# Patient Record
Sex: Male | Born: 1935 | Race: White | Hispanic: No | Smoking: Former smoker
Health system: Southern US, Community
[De-identification: ages and names within clinical notes are randomized; demographics above are authoritative.]

## PROBLEM LIST (undated history)

## (undated) DIAGNOSIS — M19041 Primary osteoarthritis, right hand: Secondary | ICD-10-CM

## (undated) DIAGNOSIS — I2510 Atherosclerotic heart disease of native coronary artery without angina pectoris: Secondary | ICD-10-CM

## (undated) DIAGNOSIS — R0600 Dyspnea, unspecified: Secondary | ICD-10-CM

## (undated) DIAGNOSIS — J189 Pneumonia, unspecified organism: Secondary | ICD-10-CM

## (undated) DIAGNOSIS — Z950 Presence of cardiac pacemaker: Secondary | ICD-10-CM

## (undated) DIAGNOSIS — I447 Left bundle-branch block, unspecified: Secondary | ICD-10-CM

## (undated) DIAGNOSIS — Z87442 Personal history of urinary calculi: Secondary | ICD-10-CM

## (undated) DIAGNOSIS — R001 Bradycardia, unspecified: Secondary | ICD-10-CM

## (undated) DIAGNOSIS — I5189 Other ill-defined heart diseases: Secondary | ICD-10-CM

## (undated) DIAGNOSIS — E785 Hyperlipidemia, unspecified: Secondary | ICD-10-CM

## (undated) DIAGNOSIS — N400 Enlarged prostate without lower urinary tract symptoms: Secondary | ICD-10-CM

## (undated) DIAGNOSIS — Z9189 Other specified personal risk factors, not elsewhere classified: Secondary | ICD-10-CM

## (undated) DIAGNOSIS — I441 Atrioventricular block, second degree: Secondary | ICD-10-CM

## (undated) DIAGNOSIS — C679 Malignant neoplasm of bladder, unspecified: Secondary | ICD-10-CM

## (undated) DIAGNOSIS — K219 Gastro-esophageal reflux disease without esophagitis: Secondary | ICD-10-CM

## (undated) DIAGNOSIS — H409 Unspecified glaucoma: Secondary | ICD-10-CM

## (undated) DIAGNOSIS — Z85528 Personal history of other malignant neoplasm of kidney: Secondary | ICD-10-CM

## (undated) DIAGNOSIS — J449 Chronic obstructive pulmonary disease, unspecified: Secondary | ICD-10-CM

## (undated) DIAGNOSIS — M19042 Primary osteoarthritis, left hand: Secondary | ICD-10-CM

## (undated) DIAGNOSIS — M1990 Unspecified osteoarthritis, unspecified site: Secondary | ICD-10-CM

## (undated) DIAGNOSIS — Z8719 Personal history of other diseases of the digestive system: Secondary | ICD-10-CM

## (undated) DIAGNOSIS — Z8679 Personal history of other diseases of the circulatory system: Secondary | ICD-10-CM

## (undated) DIAGNOSIS — N289 Disorder of kidney and ureter, unspecified: Secondary | ICD-10-CM

## (undated) DIAGNOSIS — I10 Essential (primary) hypertension: Secondary | ICD-10-CM

## (undated) DIAGNOSIS — D649 Anemia, unspecified: Secondary | ICD-10-CM

## (undated) HISTORY — PX: COLONOSCOPY: SHX174

## (undated) HISTORY — PX: EYE SURGERY: SHX253

## (undated) HISTORY — PX: INSERT / REPLACE / REMOVE PACEMAKER: SUR710

## (undated) HISTORY — PX: CORONARY ANGIOPLASTY WITH STENT PLACEMENT: SHX49

## (undated) HISTORY — DX: Disorder of kidney and ureter, unspecified: N28.9

---

## 1988-02-03 HISTORY — PX: APPENDECTOMY: SHX54

## 2004-01-03 DIAGNOSIS — Z8679 Personal history of other diseases of the circulatory system: Secondary | ICD-10-CM

## 2004-01-03 HISTORY — DX: Personal history of other diseases of the circulatory system: Z86.79

## 2004-06-13 ENCOUNTER — Ambulatory Visit: Payer: Self-pay | Admitting: Unknown Physician Specialty

## 2004-11-18 ENCOUNTER — Ambulatory Visit: Payer: Self-pay | Admitting: Cardiovascular Disease

## 2004-11-18 HISTORY — PX: LEFT HEART CATH AND CORONARY ANGIOGRAPHY: CATH118249

## 2004-11-21 HISTORY — PX: CORONARY ANGIOPLASTY WITH STENT PLACEMENT: SHX49

## 2005-03-14 ENCOUNTER — Encounter: Payer: Self-pay | Admitting: Cardiovascular Disease

## 2005-04-02 ENCOUNTER — Encounter: Payer: Self-pay | Admitting: Cardiovascular Disease

## 2005-05-02 ENCOUNTER — Encounter: Payer: Self-pay | Admitting: Cardiovascular Disease

## 2005-06-02 ENCOUNTER — Encounter: Payer: Self-pay | Admitting: Cardiovascular Disease

## 2005-11-06 ENCOUNTER — Ambulatory Visit: Payer: Self-pay | Admitting: Internal Medicine

## 2005-11-10 ENCOUNTER — Ambulatory Visit: Payer: Self-pay | Admitting: Gastroenterology

## 2006-12-13 ENCOUNTER — Ambulatory Visit: Payer: Self-pay | Admitting: Internal Medicine

## 2009-01-02 HISTORY — PX: CATARACT EXTRACTION W/ INTRAOCULAR LENS  IMPLANT, BILATERAL: SHX1307

## 2010-03-02 ENCOUNTER — Ambulatory Visit: Payer: Self-pay | Admitting: Ophthalmology

## 2010-04-20 ENCOUNTER — Ambulatory Visit: Payer: Self-pay | Admitting: Ophthalmology

## 2010-07-26 ENCOUNTER — Ambulatory Visit: Payer: Self-pay | Admitting: Emergency Medicine

## 2010-07-28 LAB — PATHOLOGY REPORT

## 2010-12-20 ENCOUNTER — Encounter (HOSPITAL_COMMUNITY)
Admission: RE | Admit: 2010-12-20 | Discharge: 2010-12-20 | Disposition: A | Discharge status: Home / Self Care | Payer: Medicare Other | Source: Ambulatory Visit | Attending: Urology | Admitting: Urology

## 2010-12-20 ENCOUNTER — Other Ambulatory Visit: Payer: Self-pay | Admitting: Urology

## 2010-12-20 ENCOUNTER — Encounter (HOSPITAL_COMMUNITY): Payer: Self-pay | Admitting: Pharmacy Technician

## 2010-12-20 ENCOUNTER — Ambulatory Visit (HOSPITAL_COMMUNITY)
Admission: RE | Admit: 2010-12-20 | Discharge: 2010-12-20 | Disposition: A | Discharge status: Home / Self Care | Payer: Medicare Other | Source: Ambulatory Visit | Attending: Urology | Admitting: Urology

## 2010-12-20 ENCOUNTER — Encounter (HOSPITAL_COMMUNITY): Payer: Self-pay

## 2010-12-20 DIAGNOSIS — I10 Essential (primary) hypertension: Secondary | ICD-10-CM | POA: Insufficient documentation

## 2010-12-20 DIAGNOSIS — Z01818 Encounter for other preprocedural examination: Secondary | ICD-10-CM | POA: Insufficient documentation

## 2010-12-20 HISTORY — DX: Unspecified osteoarthritis, unspecified site: M19.90

## 2010-12-20 HISTORY — DX: Atherosclerotic heart disease of native coronary artery without angina pectoris: I25.10

## 2010-12-20 HISTORY — DX: Gastro-esophageal reflux disease without esophagitis: K21.9

## 2010-12-20 HISTORY — DX: Hyperlipidemia, unspecified: E78.5

## 2010-12-20 HISTORY — DX: Essential (primary) hypertension: I10

## 2010-12-20 HISTORY — DX: Personal history of other diseases of the digestive system: Z87.19

## 2010-12-20 LAB — BASIC METABOLIC PANEL
BUN: 21 mg/dL (ref 6–23)
CO2: 28 mEq/L (ref 19–32)
GFR calc non Af Amer: 54 mL/min — ABNORMAL LOW (ref >90)
Glucose, Bld: 150 mg/dL — ABNORMAL HIGH (ref 70–99)
Potassium: 3.2 mEq/L — ABNORMAL LOW (ref 3.5–5.1)

## 2010-12-20 LAB — CBC
HCT: 37.1 % — ABNORMAL LOW (ref 39.0–52.0)
Hemoglobin: 12.3 g/dL — ABNORMAL LOW (ref 13.0–17.0)
MCH: 31.9 pg (ref 26.0–34.0)
MCHC: 33.2 g/dL (ref 30.0–36.0)
RBC: 3.86 MIL/uL — ABNORMAL LOW (ref 4.22–5.81)

## 2010-12-20 NOTE — Pre-Procedure Instructions (Signed)
Stress test done 07/13/10 results on chart.  EKG from stress test will arrive from Dr Rosario Jacks office on 12/21/10.   LOV note from PCP on chart from 12/12/10 Pt states he has not seen cardiology since 2009.   Cardiac stents placed at [facility] in 2006 Pt states he has not been to [facility] since 2006.

## 2010-12-20 NOTE — Patient Instructions (Signed)
[place]  12/20/2010   Your procedure is scheduled on:  0855am-0955 am   Report to [facility] at [id number] AM.  Call this number if you have problems the morning of surgery: [phone]   Remember:   Do not eat food:After Midnight.  May have clear liquids:until Midnight .  Clear liquids include soda, tea, black coffee, apple or grape juice, broth.  Take these medicines the morning of surgery with A SIP OF WATER:    Do not wear jewelry,   Do not wear lotions, powders, or perfumes.     Do not bring valuables to the hospital.  Contacts, dentures or bridgework may not be worn into surgery.  Leave suitcase in the car. After surgery it may be brought to your room.  For patients admitted to the hospital, checkout time is 11:00 AM the day of discharge.      Special Instructions: CHG Shower Use Special Wash: 1/2 bottle night before surgery and 1/2 bottle morning of surgery. Shower chin to toes with CHG. Wash face and private parts with regular soap.    Please read over the following fact sheets that you were given: MRSA Information, coughing and deep breathing exercises, leg exercises

## 2010-12-21 NOTE — Pre-Procedure Instructions (Signed)
ekg report 07/13/10 received from dr. Rosario Jacks [facility])  And placed on this chart

## 2010-12-22 ENCOUNTER — Other Ambulatory Visit: Payer: Self-pay | Admitting: Urology

## 2010-12-22 ENCOUNTER — Ambulatory Visit (HOSPITAL_COMMUNITY): Payer: Medicare Other | Admitting: Anesthesiology

## 2010-12-22 ENCOUNTER — Encounter (HOSPITAL_COMMUNITY): Payer: Self-pay | Admitting: Anesthesiology

## 2010-12-22 ENCOUNTER — Encounter (HOSPITAL_COMMUNITY): Payer: Self-pay | Admitting: *Deleted

## 2010-12-22 ENCOUNTER — Encounter (HOSPITAL_COMMUNITY)
Admission: RE | Disposition: A | Discharge status: Home / Self Care | Payer: Self-pay | Source: Ambulatory Visit | Attending: Urology

## 2010-12-22 ENCOUNTER — Observation Stay (HOSPITAL_COMMUNITY)
Admission: RE | Admit: 2010-12-22 | Discharge: 2010-12-23 | Disposition: A | Discharge status: Home / Self Care | Payer: Medicare Other | Source: Ambulatory Visit | Attending: Urology | Admitting: Urology

## 2010-12-22 DIAGNOSIS — I129 Hypertensive chronic kidney disease with stage 1 through stage 4 chronic kidney disease, or unspecified chronic kidney disease: Secondary | ICD-10-CM | POA: Insufficient documentation

## 2010-12-22 DIAGNOSIS — I2510 Atherosclerotic heart disease of native coronary artery without angina pectoris: Secondary | ICD-10-CM | POA: Insufficient documentation

## 2010-12-22 DIAGNOSIS — R351 Nocturia: Secondary | ICD-10-CM | POA: Insufficient documentation

## 2010-12-22 DIAGNOSIS — Z01812 Encounter for preprocedural laboratory examination: Secondary | ICD-10-CM | POA: Insufficient documentation

## 2010-12-22 DIAGNOSIS — R350 Frequency of micturition: Secondary | ICD-10-CM | POA: Insufficient documentation

## 2010-12-22 DIAGNOSIS — R300 Dysuria: Secondary | ICD-10-CM | POA: Insufficient documentation

## 2010-12-22 DIAGNOSIS — C679 Malignant neoplasm of bladder, unspecified: Principal | ICD-10-CM | POA: Insufficient documentation

## 2010-12-22 DIAGNOSIS — K219 Gastro-esophageal reflux disease without esophagitis: Secondary | ICD-10-CM | POA: Insufficient documentation

## 2010-12-22 DIAGNOSIS — Z79899 Other long term (current) drug therapy: Secondary | ICD-10-CM | POA: Insufficient documentation

## 2010-12-22 DIAGNOSIS — E785 Hyperlipidemia, unspecified: Secondary | ICD-10-CM | POA: Insufficient documentation

## 2010-12-22 DIAGNOSIS — R319 Hematuria, unspecified: Secondary | ICD-10-CM | POA: Insufficient documentation

## 2010-12-22 DIAGNOSIS — N189 Chronic kidney disease, unspecified: Secondary | ICD-10-CM | POA: Insufficient documentation

## 2010-12-22 HISTORY — PX: TRANSURETHRAL RESECTION OF BLADDER TUMOR: SHX2575

## 2010-12-22 SURGERY — TRANSURETHRAL PROSTATECTOMY WITH GYRUS INSTRUMENTS
Anesthesia: General | Site: Bladder | Wound class: Clean Contaminated

## 2010-12-22 MED ORDER — ZOLPIDEM TARTRATE 5 MG PO TABS: 5.0000 mg | ORAL_TABLET | Freq: Every evening | ORAL | Status: DC | PRN

## 2010-12-22 MED ORDER — HYDROCODONE-ACETAMINOPHEN 5-325 MG PO TABS: 1.0000 | ORAL_TABLET | ORAL | Status: DC | PRN

## 2010-12-22 MED ORDER — ONDANSETRON HCL 4 MG/2ML IJ SOLN: 4.0000 mg | INTRAMUSCULAR | Status: DC | PRN

## 2010-12-22 MED ORDER — LIDOCAINE HCL (CARDIAC) 20 MG/ML IV SOLN
INTRAVENOUS | Status: DC | PRN
  Administered 2010-12-22: 80 mg via INTRAVENOUS

## 2010-12-22 MED ORDER — HYDROMORPHONE HCL PF 1 MG/ML IJ SOLN
INTRAMUSCULAR | Status: AC
  Filled 2010-12-22: qty 1

## 2010-12-22 MED ORDER — HYDROMORPHONE HCL PF 1 MG/ML IJ SOLN
0.2500 mg | INTRAMUSCULAR | Status: DC | PRN
  Administered 2010-12-22 (×4): 0.5 mg via INTRAVENOUS

## 2010-12-22 MED ORDER — ACETAMINOPHEN 10 MG/ML IV SOLN
1000.0000 mg | Freq: Once | INTRAVENOUS | Status: DC
  Administered 2010-12-22: 1000 mg via INTRAVENOUS

## 2010-12-22 MED ORDER — PROMETHAZINE HCL 25 MG/ML IJ SOLN: 6.2500 mg | INTRAMUSCULAR | Status: DC | PRN

## 2010-12-22 MED ORDER — ACETAMINOPHEN 10 MG/ML IV SOLN
INTRAVENOUS | Status: AC
  Filled 2010-12-22: qty 100

## 2010-12-22 MED ORDER — PROPOFOL 10 MG/ML IV BOLUS
INTRAVENOUS | Status: DC | PRN
  Administered 2010-12-22: 160 mg via INTRAVENOUS

## 2010-12-22 MED ORDER — ISOSORBIDE MONONITRATE ER 30 MG PO TB24
30.0000 mg | ORAL_TABLET | Freq: Every day | ORAL | Status: DC
  Administered 2010-12-22: 30 mg via ORAL
  Filled 2010-12-22 (×2): qty 1

## 2010-12-22 MED ORDER — BISACODYL 10 MG RE SUPP: 10.0000 mg | Freq: Every day | RECTAL | Status: DC | PRN

## 2010-12-22 MED ORDER — SODIUM CHLORIDE 0.9 % IR SOLN
Status: DC | PRN
  Administered 2010-12-22: 3000 mL

## 2010-12-22 MED ORDER — LATANOPROST 0.005 % OP SOLN
1.0000 [drp] | Freq: Every day | OPHTHALMIC | Status: DC
  Administered 2010-12-22: 1 [drp] via OPHTHALMIC
  Filled 2010-12-22: qty 2.5

## 2010-12-22 MED ORDER — LACTATED RINGERS IV SOLN
INTRAVENOUS | Status: DC
  Administered 2010-12-22: 1000 mL via INTRAVENOUS

## 2010-12-22 MED ORDER — RANOLAZINE ER 500 MG PO TB12
500.0000 mg | ORAL_TABLET | Freq: Two times a day (BID) | ORAL | Status: DC
  Administered 2010-12-23: 500 mg via ORAL
  Filled 2010-12-22 (×3): qty 1

## 2010-12-22 MED ORDER — LORATADINE 10 MG PO TABS
10.0000 mg | ORAL_TABLET | Freq: Every day | ORAL | Status: DC
  Administered 2010-12-22 – 2010-12-23 (×2): 10 mg via ORAL
  Filled 2010-12-22 (×2): qty 1

## 2010-12-22 MED ORDER — ACETAMINOPHEN 10 MG/ML IV SOLN
1000.0000 mg | Freq: Four times a day (QID) | INTRAVENOUS | Status: DC
  Administered 2010-12-22 – 2010-12-23 (×3): 1000 mg via INTRAVENOUS
  Filled 2010-12-22 (×4): qty 100

## 2010-12-22 MED ORDER — LACTATED RINGERS IV SOLN
INTRAVENOUS | Status: DC | PRN
  Administered 2010-12-22: 08:00:00 via INTRAVENOUS

## 2010-12-22 MED ORDER — SIMVASTATIN 5 MG PO TABS
5.0000 mg | ORAL_TABLET | Freq: Every day | ORAL | Status: DC
  Administered 2010-12-22: 5 mg via ORAL
  Filled 2010-12-22 (×2): qty 1

## 2010-12-22 MED ORDER — CIPROFLOXACIN HCL 500 MG PO TABS
500.0000 mg | ORAL_TABLET | Freq: Two times a day (BID) | ORAL | Status: DC
  Administered 2010-12-22 – 2010-12-23 (×2): 500 mg via ORAL
  Filled 2010-12-22 (×3): qty 1

## 2010-12-22 MED ORDER — OXYBUTYNIN CHLORIDE 5 MG PO TABS
5.0000 mg | ORAL_TABLET | Freq: Three times a day (TID) | ORAL | Status: DC | PRN
  Administered 2010-12-22: 5 mg via ORAL
  Filled 2010-12-22: qty 1

## 2010-12-22 MED ORDER — SALINE NASAL SPRAY 0.65 % NA SOLN
1.0000 | Freq: Two times a day (BID) | NASAL | Status: DC
  Administered 2010-12-23: 1 via NASAL
  Filled 2010-12-22 (×7): qty 0.5

## 2010-12-22 MED ORDER — BACITRACIN-NEOMYCIN-POLYMYXIN 400-5-5000 EX OINT: 1.0000 "application " | TOPICAL_OINTMENT | Freq: Three times a day (TID) | CUTANEOUS | Status: DC | PRN

## 2010-12-22 MED ORDER — CIPROFLOXACIN IN D5W 400 MG/200ML IV SOLN
INTRAVENOUS | Status: DC | PRN
  Administered 2010-12-22: 400 mg via INTRAVENOUS

## 2010-12-22 MED ORDER — FENTANYL CITRATE 0.05 MG/ML IJ SOLN
INTRAMUSCULAR | Status: DC | PRN
  Administered 2010-12-22: 25 ug via INTRAVENOUS
  Administered 2010-12-22: 50 ug via INTRAVENOUS
  Administered 2010-12-22: 25 ug via INTRAVENOUS

## 2010-12-22 MED ORDER — HYDROCHLOROTHIAZIDE 25 MG PO TABS
25.0000 mg | ORAL_TABLET | Freq: Every day | ORAL | Status: DC
  Administered 2010-12-23: 25 mg via ORAL
  Filled 2010-12-22: qty 1

## 2010-12-22 MED ORDER — LOSARTAN POTASSIUM 50 MG PO TABS
50.0000 mg | ORAL_TABLET | Freq: Every day | ORAL | Status: DC
  Administered 2010-12-22: 50 mg via ORAL
  Filled 2010-12-22 (×2): qty 1

## 2010-12-22 MED ORDER — MIDAZOLAM HCL 5 MG/5ML IJ SOLN
INTRAMUSCULAR | Status: DC | PRN
  Administered 2010-12-22 (×2): 1 mg via INTRAVENOUS

## 2010-12-22 MED ORDER — MONTELUKAST SODIUM 10 MG PO TABS
10.0000 mg | ORAL_TABLET | Freq: Every day | ORAL | Status: DC
  Administered 2010-12-22: 10 mg via ORAL
  Filled 2010-12-22 (×2): qty 1

## 2010-12-22 MED ORDER — LACTATED RINGERS IV SOLN: INTRAVENOUS | Status: DC

## 2010-12-22 MED ORDER — HYDROMORPHONE HCL PF 1 MG/ML IJ SOLN
0.5000 mg | INTRAMUSCULAR | Status: DC | PRN
  Administered 2010-12-22: 1 mg via INTRAVENOUS
  Filled 2010-12-22: qty 1

## 2010-12-22 MED ORDER — ONDANSETRON HCL 4 MG/2ML IJ SOLN
INTRAMUSCULAR | Status: DC | PRN
  Administered 2010-12-22: 4 mg via INTRAVENOUS

## 2010-12-22 MED ORDER — DEXTROSE-NACL 5-0.45 % IV SOLN
INTRAVENOUS | Status: DC
  Administered 2010-12-22 (×2): via INTRAVENOUS
  Administered 2010-12-23: 100 mL via INTRAVENOUS

## 2010-12-22 MED ORDER — CIPROFLOXACIN IN D5W 400 MG/200ML IV SOLN: 400.0000 mg | INTRAVENOUS | Status: DC

## 2010-12-22 MED ORDER — BELLADONNA ALKALOIDS-OPIUM 16.2-60 MG RE SUPP
RECTAL | Status: DC | PRN
  Administered 2010-12-22: 1 via RECTAL

## 2010-12-22 MED ORDER — PANTOPRAZOLE SODIUM 40 MG PO TBEC
40.0000 mg | DELAYED_RELEASE_TABLET | Freq: Every day | ORAL | Status: DC
  Administered 2010-12-22 – 2010-12-23 (×2): 40 mg via ORAL
  Filled 2010-12-22 (×2): qty 1

## 2010-12-22 SURGICAL SUPPLY — 31 items
BAG URINE DRAINAGE (UROLOGICAL SUPPLIES) ×2 IMPLANT
BAG URO CATCHER STRL LF (DRAPE) ×2 IMPLANT
BLADE SURG 15 STRL LF DISP TIS (BLADE) IMPLANT
BLADE SURG 15 STRL SS (BLADE)
CATH AINSWORTH 30CC 24FR (CATHETERS) IMPLANT
CATH FOLEY 2WAY SLVR  5CC 20FR (CATHETERS) ×1
CATH FOLEY 2WAY SLVR 5CC 20FR (CATHETERS) ×1 IMPLANT
CATH FOLEY 3WAY 30CC 24FR (CATHETERS)
CATH URO 16X24FR 3W FL PS (CATHETERS) IMPLANT
CLOTH BEACON ORANGE TIMEOUT ST (SAFETY) ×2 IMPLANT
DRAPE CAMERA CLOSED 9X96 (DRAPES) ×2 IMPLANT
ELECT HF RESECT BIPO 24F 45 ND (CUTTING LOOP) ×2 IMPLANT
ELECT LOOP MED HF 24F 12D (CUTTING LOOP) ×2 IMPLANT
ELECT REM PT RETURN 9FT ADLT (ELECTROSURGICAL)
ELECTRODE REM PT RTRN 9FT ADLT (ELECTROSURGICAL) IMPLANT
GLOVE BIOGEL M STRL SZ7.5 (GLOVE) ×2 IMPLANT
GOWN STRL NON-REIN LRG LVL3 (GOWN DISPOSABLE) ×2 IMPLANT
GOWN STRL REIN XL XLG (GOWN DISPOSABLE) ×2 IMPLANT
HOLDER FOLEY CATH W/STRAP (MISCELLANEOUS) ×2 IMPLANT
KIT ASPIRATION TUBING (SET/KITS/TRAYS/PACK) ×2 IMPLANT
KIT SUPRAPUBIC CATH (MISCELLANEOUS) IMPLANT
MANIFOLD NEPTUNE II (INSTRUMENTS) ×2 IMPLANT
MARKER SKIN DUAL TIP RULER LAB (MISCELLANEOUS) ×2 IMPLANT
NEEDLE SPNL 18GX3.5 QUINCKE PK (NEEDLE) IMPLANT
NS IRRIG 1000ML POUR BTL (IV SOLUTION) ×2 IMPLANT
PACK CYSTO (CUSTOM PROCEDURE TRAY) ×2 IMPLANT
SCRUB PCMX 4 OZ (MISCELLANEOUS) ×2 IMPLANT
SUT ETHILON 3 0 PS 1 (SUTURE) IMPLANT
SYR 30ML LL (SYRINGE) ×2 IMPLANT
SYRINGE IRR TOOMEY STRL 70CC (SYRINGE) ×2 IMPLANT
TUBING CONNECTING 10 (TUBING) ×2 IMPLANT

## 2010-12-22 NOTE — H&P (Signed)
Urology Admission H&P  Chief Complaint: gross hematuria  History of Present Illness:77 yo male referred by Jacob [name] for gross hematuria evaluation, c/o dysuria, freqwuency, urgency and nocturia. He has a CT showing a large L bladder wall tumor, and cystoscopy showing l bladder wall solid tumor.   Past Medical History  Diagnosis Date  . Hypertension   . Coronary artery disease     stents placed at [facility] 11/2004 Jacob [name]   . Chronic kidney disease     hematuria   . GERD (gastroesophageal reflux disease)   . H/O hiatal hernia   . Arthritis     hand  . Hyperlipidemia    Past Surgical History  Procedure Date  . Cardiac catheterization     2006 at [facility]   . Appendectomy     02/1988     Home Medications:  Prescriptions prior to admission  Medication Sig Dispense Refill  . cetirizine (ZYRTEC) 10 MG tablet Take 10 mg by mouth daily as needed. ALLERGIES       . dexlansoprazole (DEXILANT) 60 MG capsule Take 60 mg by mouth daily.       . hydrochlorothiazide (HYDRODIURIL) 25 MG tablet Take 25 mg by mouth every morning.       . isosorbide mononitrate (IMDUR) 30 MG 24 hr tablet Take 30 mg by mouth at bedtime.       Marland Kitchen latanoprost (XALATAN) 0.005 % ophthalmic solution Place 1 drop into both eyes at bedtime.       Marland Kitchen losartan (COZAAR) 50 MG tablet Take 50 mg by mouth at bedtime.       . lovastatin (MEVACOR) 20 MG tablet Take 20 mg by mouth at bedtime.       . montelukast (SINGULAIR) 10 MG tablet Take 10 mg by mouth at bedtime.       . timolol (BETIMOL) 0.5 % ophthalmic solution Place 1 drop into both eyes daily.       Marland Kitchen acetaminophen (TYLENOL) 500 MG tablet Take 1,000 mg by mouth every 6 (six) hours as needed. PAIN       . aspirin EC 81 MG tablet Take 81 mg by mouth every morning.       Marland Kitchen esomeprazole (NEXIUM) 40 MG capsule Take 40 mg by mouth daily.       . fish oil-omega-3 fatty acids 1000 MG capsule Take 1,200 mg by mouth 2 (two) times daily.       . Multiple  Vitamins-Minerals (MULTIVITAMINS THER. W/MINERALS) TABS Take 1 tablet by mouth daily.       . ranolazine (RANEXA) 500 MG 12 hr tablet Take 500 mg by mouth 2 (two) times daily.       . sodium chloride (OCEAN) 0.65 % nasal spray Place 1 spray into the nose 2 (two) times daily as needed. DRYNESS        Allergies:  Allergies  Allergen Reactions  . Ampicillin Rash    History reviewed. No pertinent family history. Social History:  reports that he quit smoking about 10 years ago. His smoking use included Cigarettes. He quit after 50 years of use. He has never used smokeless tobacco. He reports that he does not drink alcohol or use illicit drugs.  Review of Systems  Constitutional: Negative for fever, chills, weight loss and malaise/fatigue.  HENT: Negative.   Eyes: Negative.   Respiratory: Negative.   Cardiovascular: Negative.   Gastrointestinal: Negative.   Genitourinary: Positive for dysuria, urgency, frequency and hematuria. Negative for flank  pain.  Musculoskeletal: Negative.   Skin: Negative.   Neurological: Negative.   Endo/Heme/Allergies: Negative.   Psychiatric/Behavioral: Negative.     Physical Exam:  Vital signs in last 24 hours: Temp:  [97.1 F (36.2 C)] 97.1 F (36.2 C) (12/20 0712) Pulse Rate:  [71] 71  (12/20 0712) Resp:  [20] 20  (12/20 0712) BP: (142)/(71) 142/71 mmHg (12/20 0712) SpO2:  [95 %] 95 % (12/20 [id number]) Physical Exam  Constitutional: He appears well-developed.  HENT:  Head: Normocephalic.  Eyes: Pupils are equal, round, and reactive to light.  Neck: Normal range of motion.  Cardiovascular: Normal rate.   Respiratory: Effort normal.  GI: Soft. He exhibits no distension. There is no tenderness. There is no rebound.  Genitourinary: Penis normal.  Musculoskeletal: Normal range of motion.  Neurological: He is alert.  Skin: Skin is warm.    Laboratory Data:  No results found for this or any previous visit (from the past 24 hour(s)). 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

## 2010-12-22 NOTE — Op Note (Signed)
Pre-operative diagnosis : bladder cancer  Postoperative diagnosis:same  Operation: cystoscopy, TUR-BT, cold cup bladder biopsy  Surgeon:  S. Gaynelle Arabian, MD  Anesthesia:  general  Preparation: after appropriate preanesthesia, the patient was brought to the operating room, and placed on the operating table in the dorsal supine position where general LMA anesthesia was introduced. He was then placed in the dorsal lithotomy position where the pubis was prepped with Betadine solution and draped in usual fashion. The Endo suppository was given. IV Toradol was given preoperatively. IV Cipro was given. Timeout was observed.  Review history: Mr. Stjulien is an 77 year old male with 12 days of gross painless hematuria. He complains of frequency, nocturia occasional dysuria. He has a 50-pack-year history of smoking but none for 10 years. He has a positive in the MP 22. CT showed left bladder wall mass measuring 2.9 x 2.7 cm along the left posterior bladder wall. Cystoscopy confirms bladder mass presents. He is now for bladder tumor resection.  Statement of  Likelihood of Success: Excellent. TIME-OUT observed.:  Procedure: The 77 F conttinuous-flow cystoscope was placed within the bladder, and the bladder appeared normal except for a very large solitary mass in the left posterior bladder wall, measuring 3 cm. Trigone appeared normal, except for abnormal granular tissue over the left trigone and each. There was no evidence of bladder stone or diverticular formation.  The bladder tumor  is resected completely. There is a large amount of necrosis present. It is sent to the laboratory for examination. I also identified abnormalities along the trigonal ridge above the left orifice, and this was cold cup biopsied. Cauterization was accomplished. Photodocumentation was accomplished as well. The patient tolerated procedure well, and the Endo suppository placed, was awakened and taken recovery room in good condition.

## 2010-12-22 NOTE — Transfer of Care (Signed)
Immediate Anesthesia Transfer of Care Note  Patient: Jacob [name]  Procedure(s) Performed:  TRANSURETHRAL PROSTATECTOMY WITH GYRUS INSTRUMENTS - transurethral resection of bladder tumor.  Patient Location: PACU  Anesthesia Type: General  Level of Consciousness: awake and alert   Airway & Oxygen Therapy: Patient Spontanous Breathing and Patient connected to face mask oxygen  Post-op Assessment: Report given to PACU RN and Post -op Vital signs reviewed and stable  Post vital signs: Reviewed and stable  Complications: No apparent anesthesia complications

## 2010-12-22 NOTE — Anesthesia Preprocedure Evaluation (Addendum)
Anesthesia Evaluation  Patient identified by MRN, date of birth, ID band Patient awake    Reviewed: Allergy & Precautions, H&P , NPO status , Patient's Chart, lab work & pertinent test results  Airway Mallampati: II TM Distance: >3 FB Neck ROM: full    Dental  (+) Edentulous Upper and Edentulous Lower   Pulmonary neg pulmonary ROS,  clear to auscultation  Pulmonary exam normal       Cardiovascular Exercise Tolerance: Good hypertension, On Medications + CAD, + Cardiac Stents and neg cardio ROS regular Normal Stents 2006.  Ok since.  No CP   Neuro/Psych Negative Neurological ROS  Negative Psych ROS   GI/Hepatic negative GI ROS, Neg liver ROS, hiatal hernia, GERD-  Medicated and Controlled,  Endo/Other  Negative Endocrine ROS  Renal/GU negative Renal ROS  Genitourinary negative   Musculoskeletal   Abdominal   Peds  Hematology negative hematology ROS (+)   Anesthesia Other Findings   Reproductive/Obstetrics negative OB ROS                          Anesthesia Physical Anesthesia Plan  ASA: III  Anesthesia Plan: General   Post-op Pain Management:    Induction: Intravenous  Airway Management Planned: LMA  Additional Equipment:   Intra-op Plan:   Post-operative Plan:   Informed Consent: I have reviewed the patients History and Physical, chart, labs and discussed the procedure including the risks, benefits and alternatives for the proposed anesthesia with the patient or authorized representative who has indicated his/her understanding and acceptance.   Dental Advisory Given  Plan Discussed with: CRNA and Surgeon  Anesthesia Plan Comments:         Anesthesia Quick Evaluation

## 2010-12-22 NOTE — Anesthesia Postprocedure Evaluation (Signed)
  Anesthesia Post-op Note  Patient: Jacob [name]  Procedure(s) Performed:  TRANSURETHRAL PROSTATECTOMY WITH GYRUS INSTRUMENTS - transurethral resection of bladder tumor.  Patient Location: PACU  Anesthesia Type: General  Level of Consciousness: awake and alert   Airway and Oxygen Therapy: Patient Spontanous Breathing  Post-op Pain: mild  Post-op Assessment: Post-op Vital signs reviewed, Patient's Cardiovascular Status Stable, Respiratory Function Stable, Patent Airway and No signs of Nausea or vomiting  Post-op Vital Signs: stable  Complications: No apparent anesthesia complications

## 2010-12-23 MED ORDER — CIPROFLOXACIN HCL 500 MG PO TABS: 500.0000 mg | ORAL_TABLET | Freq: Two times a day (BID) | ORAL | Status: AC

## 2010-12-23 MED ORDER — URELLE 81 MG PO TABS: 1.0000 | ORAL_TABLET | Freq: Three times a day (TID) | ORAL | Status: DC

## 2010-12-23 MED ORDER — HYDROCODONE-ACETAMINOPHEN 7.5-650 MG PO TABS: 1.0000 | ORAL_TABLET | Freq: Four times a day (QID) | ORAL | Status: AC | PRN

## 2010-12-23 NOTE — Discharge Summary (Signed)
Physician Discharge Summary  Patient ID: Jacob [name] MRN: [id number] DOB/AGE: 1928/10/29 77 y.o.  Admit date: 12/22/2010 Discharge date: 12/23/2010  Admission Diagnoses:  Discharge Diagnoses:  Bladder cancer  Discharged Condition: good  Hospital Course:TUR-BT 12/22/10  Consults: none  Significant Diagnostic Studies: labs:   Treatments: IV hydration  Discharge Exam: Blood pressure 110/58, pulse 55, temperature 98.7 F (37.1 C), temperature source Oral, resp. rate 16, height 6' (1.829 m), weight 89.404 kg (197 lb 1.6 oz), SpO2 95.00%. General appearance: alert and cooperative GI: soft, non-tender; bowel sounds normal; no masses,  no organomegaly Male genitalia: normal  Disposition: Home  Discharge Orders    Future Orders Please Complete By Expires   Diet - low sodium heart healthy      Increase activity slowly      Discharge instructions      Comments:   Post transurethral resection of the bladder cancer instructions:  Your recent bladder cancer surgery requires very special post hospital care. Despite the fact that no skin incisions were used the area around the bladder incision is quite raw and is covered with a scab to promote healing and prevent bleeding. Certain cautions are needed to assure that the scab is not disturbed of the next 2-3 weeks while the healing proceeds.  Because the raw surface in your prostate and the irritating effects of urine you may expect frequency of urination and/or urgency (a stronger desire to urinate) and perhaps even getting up at night more often. This will usually resolve or improve slowly over the healing period. You may see some blood in your urine over the first 6 weeks. Do not be alarmed, even if the urine was clear for a while. Get off your feet and drink lots of fluids until clearing occurs. If you start to pass clots or don't improve call us.  Diet:  You may return to your normal diet immediately. Because of the raw surface of  your bladder, alcohol, spicy foods, foods high in acid and drinks with caffeine may cause irritation or frequency and should be used in moderation. To keep your urine flowing freely and avoid constipation, drink plenty of fluids during the day (8-10 glasses). Tip: Avoid cranberry juice because it is very acidic.  Activity:  Your physical activity doesn't need to be restricted. However, if you are very active, you may see some blood in the urine. We suggest that you reduce your activity under the circumstances until the bleeding has stopped.  Bowels:  It is important to keep your bowels regular during the postoperative period. Straining with bowel movements can cause bleeding. A bowel movement every other day is reasonable. Use a mild laxative if needed, such as milk of magnesia 2-3 tablespoons, or 2 Dulcolax tablets. Call if you continue to have problems. If you had been taking narcotics for pain, before, during or after your surgery, you may be constipated. Take a laxative if necessary.  Medication:  You should resume your pre-surgery medications unless told not to. In addition you may be given an antibiotic to prevent or treat infection. Antibiotics are not always necessary. All medication should be taken as prescribed until the bottles are finished unless you are having an unusual reaction to one of the drugs.     Problems you should report to [place]:  a. Fever greater than 101F. b. Heavy bleeding, or clots (see notes above about blood in urine). c. Inability to urinate. d. Drug reactions (hives, rash, nausea, vomiting, diarrhea). e. Severe burning or  pain with urination that is not improving.    Call MD for:  severe uncontrolled pain      Call MD for:      Scheduling Instructions:   If unable to urinate   Discontinue IV        Medication List  As of 12/23/2010 10:27 AM   CONTINUE taking these medications         acetaminophen 500 MG tablet   Commonly known as: TYLENOL       cetirizine 10 MG tablet   Commonly known as: ZYRTEC      isosorbide mononitrate 30 MG 24 hr tablet   Commonly known as: IMDUR      latanoprost 0.005 % ophthalmic solution   Commonly known as: XALATAN      losartan 50 MG tablet   Commonly known as: COZAAR      lovastatin 20 MG tablet   Commonly known as: MEVACOR      montelukast 10 MG tablet   Commonly known as: SINGULAIR      multivitamins ther. w/minerals Tabs      sodium chloride 0.65 % nasal spray   Commonly known as: OCEAN      timolol 0.5 % ophthalmic solution   Commonly known as: BETIMOL         STOP taking these medications         aspirin EC 81 MG tablet      ranolazine 500 MG 12 hr tablet         ASK your doctor about these medications         DEXILANT 60 MG capsule   Generic drug: dexlansoprazole      esomeprazole 40 MG capsule   Commonly known as: NEXIUM      fish oil-omega-3 fatty acids 1000 MG capsule      hydrochlorothiazide 25 MG tablet   Commonly known as: HYDRODIURIL           Follow-up Information    Follow up with [name] I, MD. (pt has appointment)    Contact information:   [place], [facility] [place] [phone]          Signed: [name] I 12/23/2010, 10:27 AM

## 2010-12-23 NOTE — Progress Notes (Signed)
S: post op day 1. Post TUR-BT and cold dup biopsy  O: Urine clear. Feels well. Abd. Benign.   A: Stabel today  P: D/c foley and D/C today.

## 2010-12-23 NOTE — Progress Notes (Signed)
Patient discharged to home,FC removed able to urinate prior to d/c, PIV removed no s/s of infiltration , no swelling noted. No complaints of any pain or discomfort upon discharge.

## 2011-04-06 ENCOUNTER — Other Ambulatory Visit: Payer: Self-pay | Admitting: Urology

## 2011-04-06 MED ORDER — MITOMYCIN CHEMO FOR BLADDER INSTILLATION 40 MG: 40.0000 mg | Freq: Once | INTRAVENOUS | Status: DC

## 2011-04-10 ENCOUNTER — Other Ambulatory Visit: Payer: Self-pay | Admitting: Urology

## 2011-04-10 ENCOUNTER — Encounter (HOSPITAL_COMMUNITY): Payer: Self-pay

## 2011-04-10 ENCOUNTER — Other Ambulatory Visit: Payer: Self-pay

## 2011-04-10 ENCOUNTER — Encounter (HOSPITAL_COMMUNITY)
Admission: RE | Admit: 2011-04-10 | Discharge: 2011-04-10 | Disposition: A | Discharge status: Home / Self Care | Payer: Medicare Other | Source: Ambulatory Visit | Attending: Urology | Admitting: Urology

## 2011-04-10 LAB — CBC
HCT: 38.6 % — ABNORMAL LOW (ref 39.0–52.0)
Hemoglobin: 12.5 g/dL — ABNORMAL LOW (ref 13.0–17.0)
MCH: 30.4 pg (ref 26.0–34.0)
RBC: 4.11 MIL/uL — ABNORMAL LOW (ref 4.22–5.81)

## 2011-04-10 LAB — SURGICAL PCR SCREEN: MRSA, PCR: NEGATIVE

## 2011-04-10 LAB — BASIC METABOLIC PANEL
BUN: 21 mg/dL (ref 6–23)
CO2: 30 mEq/L (ref 19–32)
Calcium: 9.1 mg/dL (ref 8.4–10.5)
Glucose, Bld: 121 mg/dL — ABNORMAL HIGH (ref 70–99)
Potassium: 3.7 mEq/L (ref 3.5–5.1)
Sodium: 140 mEq/L (ref 135–145)

## 2011-04-10 NOTE — Pre-Procedure Instructions (Signed)
PT HAS CXR REPORT FROM [facility] THAT WAS DONE 12/20/10-REPORT IN EPIC AND COPY ON PT'S CHART.   NO EKG REPORT SCANNED INTO EPIC--OBTAINED OLD EKG REPORT 01/30/2008 AND OBTAINED NUCLEAR STRESS TEST REPORT 07/13/2010 -REPORTS FROM PT'S MEDICAL DOCTOR'S OFFICE -DR. JADALA IN [place]--BUT THE RESTING EKG REPORT NOT AVAILABLE.  EKG WAS DONE TODAY PREOP AT [facility]  --ALSO CBC AND BMET WERE DRAWN.

## 2011-04-10 NOTE — Patient Instructions (Addendum)
YOUR SURGERY IS SCHEDULED ON:  Thursday  4/11  AT 10:45 AM  REPORT TO [facility] AT:  8:45 AM      PHONE # FOR SHORT STAY IS [phone]  DO NOT EAT OR DRINK ANYTHING AFTER MIDNIGHT THE NIGHT BEFORE YOUR SURGERY.  YOU MAY BRUSH YOUR TEETH, RINSE OUT YOUR MOUTH--BUT NO WATER, NO FOOD, NO CHEWING GUM, NO MINTS, NO CANDIES, NO CHEWING TOBACCO.  PLEASE TAKE THE FOLLOWING MEDICATIONS THE AM OF YOUR SURGERY WITH A FEW SIPS OF WATER:  ZYRTEC, DEXILANT, RANEXA, AND USE YOUR AM EYEDROPS    IF YOU USE INHALERS--USE YOUR INHALERS THE AM OF YOUR SURGERY AND BRING INHALERS TO [place].    IF YOU ARE DIABETIC:  DO NOT TAKE ANY DIABETIC MEDICATIONS THE AM OF YOUR SURGERY.  IF YOU TAKE INSULIN IN THE EVENINGS--PLEASE ONLY TAKE 1/2 NORMAL EVENING DOSE THE NIGHT BEFORE YOUR SURGERY.  NO INSULIN THE AM OF YOUR SURGERY.  IF YOU HAVE SLEEP APNEA AND USE CPAP OR BIPAP--PLEASE BRING THE MASK --NOT THE MACHINE-NOT THE TUBING   -JUST THE MASK. DO NOT BRING VALUABLES, MONEY, CREDIT CARDS.  CONTACT LENS, DENTURES / PARTIALS, GLASSES SHOULD NOT BE WORN TO SURGERY AND IN MOST CASES-HEARING AIDS WILL NEED TO BE REMOVED.  BRING YOUR GLASSES CASE, ANY EQUIPMENT NEEDED FOR YOUR CONTACT LENS. FOR PATIENTS ADMITTED TO THE HOSPITAL--CHECK OUT TIME THE DAY OF DISCHARGE IS 11:00 AM.  ALL INPATIENT ROOMS ARE PRIVATE - WITH BATHROOM, TELEPHONE, TELEVISION AND WIFI INTERNET. IF YOU ARE BEING DISCHARGED THE SAME DAY OF YOUR SURGERY--YOU CAN NOT DRIVE YOURSELF HOME--AND SHOULD NOT GO HOME ALONE BY TAXI OR BUS.  NO DRIVING OR OPERATING MACHINERY FOR 24 HOURS FOLLOWING ANESTHESIA / PAIN MEDICATIONS.                            SPECIAL INSTRUCTIONS:  CHLORHEXIDINE SOAP SHOWER (other brand names are Betasept and Hibiclens ) PLEASE SHOWER WITH CHLORHEXIDINE THE NIGHT BEFORE YOUR SURGERY AND THE AM OF YOUR SURGERY. DO NOT USE CHLORHEXIDINE ON YOUR FACE OR PRIVATE AREAS--YOU MAY USE YOUR NORMAL SOAP  THOSE AREAS AND YOUR NORMAL SHAMPOO.  WOMEN SHOULD AVOID SHAVING UNDER ARMS AND SHAVING LEGS 4 HOURS BEFORE USING CHLORHEXIDINE TO AVOID SKIN IRRITATION.  DO NOT USE IF ALLERGIC TO CHLORHEXIDINE.  PLEASE READ OVER ANY  FACT SHEETS THAT YOU WERE GIVEN: MRSA INFORMATION

## 2011-04-13 ENCOUNTER — Encounter (HOSPITAL_COMMUNITY)
Admission: RE | Disposition: A | Discharge status: Home / Self Care | Payer: Self-pay | Source: Ambulatory Visit | Attending: Urology

## 2011-04-13 ENCOUNTER — Encounter (HOSPITAL_COMMUNITY): Payer: Self-pay | Admitting: Anesthesiology

## 2011-04-13 ENCOUNTER — Encounter (HOSPITAL_COMMUNITY): Payer: Self-pay | Admitting: *Deleted

## 2011-04-13 ENCOUNTER — Ambulatory Visit (HOSPITAL_COMMUNITY)
Admission: RE | Admit: 2011-04-13 | Discharge: 2011-04-13 | Disposition: A | Discharge status: Home / Self Care | Payer: Medicare Other | Source: Ambulatory Visit | Attending: Urology | Admitting: Urology

## 2011-04-13 ENCOUNTER — Ambulatory Visit (HOSPITAL_COMMUNITY): Payer: Medicare Other | Admitting: Anesthesiology

## 2011-04-13 DIAGNOSIS — C679 Malignant neoplasm of bladder, unspecified: Secondary | ICD-10-CM

## 2011-04-13 DIAGNOSIS — H409 Unspecified glaucoma: Secondary | ICD-10-CM | POA: Insufficient documentation

## 2011-04-13 DIAGNOSIS — Z79899 Other long term (current) drug therapy: Secondary | ICD-10-CM | POA: Insufficient documentation

## 2011-04-13 DIAGNOSIS — C673 Malignant neoplasm of anterior wall of bladder: Secondary | ICD-10-CM | POA: Insufficient documentation

## 2011-04-13 DIAGNOSIS — Z01810 Encounter for preprocedural cardiovascular examination: Secondary | ICD-10-CM | POA: Insufficient documentation

## 2011-04-13 DIAGNOSIS — E7800 Pure hypercholesterolemia, unspecified: Secondary | ICD-10-CM | POA: Insufficient documentation

## 2011-04-13 DIAGNOSIS — Z01812 Encounter for preprocedural laboratory examination: Secondary | ICD-10-CM | POA: Insufficient documentation

## 2011-04-13 HISTORY — PX: CYSTOSCOPY WITH BIOPSY: SHX5122

## 2011-04-13 SURGERY — CYSTOSCOPY, WITH BIOPSY
Anesthesia: General | Wound class: Clean Contaminated

## 2011-04-13 MED ORDER — HYDROCODONE-ACETAMINOPHEN 7.5-650 MG PO TABS: 1.0000 | ORAL_TABLET | Freq: Four times a day (QID) | ORAL | Status: AC | PRN

## 2011-04-13 MED ORDER — FENTANYL CITRATE 0.05 MG/ML IJ SOLN
INTRAMUSCULAR | Status: DC | PRN
  Administered 2011-04-13: 50 ug via INTRAVENOUS

## 2011-04-13 MED ORDER — FENTANYL CITRATE 0.05 MG/ML IJ SOLN
INTRAMUSCULAR | Status: AC
  Filled 2011-04-13: qty 2

## 2011-04-13 MED ORDER — PROMETHAZINE HCL 25 MG/ML IJ SOLN: 6.2500 mg | INTRAMUSCULAR | Status: DC | PRN

## 2011-04-13 MED ORDER — OXYBUTYNIN CHLORIDE ER 5 MG PO TB24: 5.0000 mg | ORAL_TABLET | Freq: Every day | ORAL | Status: DC

## 2011-04-13 MED ORDER — PHENAZOPYRIDINE HCL 200 MG PO TABS: 200.0000 mg | ORAL_TABLET | Freq: Three times a day (TID) | ORAL | Status: AC | PRN

## 2011-04-13 MED ORDER — FENTANYL CITRATE 0.05 MG/ML IJ SOLN
25.0000 ug | INTRAMUSCULAR | Status: DC | PRN
  Administered 2011-04-13 (×2): 50 ug via INTRAVENOUS

## 2011-04-13 MED ORDER — ACETAMINOPHEN 10 MG/ML IV SOLN
INTRAVENOUS | Status: DC | PRN
  Administered 2011-04-13: 1000 mg via INTRAVENOUS

## 2011-04-13 MED ORDER — PHENAZOPYRIDINE HCL 200 MG PO TABS
200.0000 mg | ORAL_TABLET | Freq: Three times a day (TID) | ORAL | Status: DC
  Administered 2011-04-13: 200 mg via ORAL

## 2011-04-13 MED ORDER — PROPOFOL 10 MG/ML IV EMUL
INTRAVENOUS | Status: DC | PRN
  Administered 2011-04-13: 150 mg via INTRAVENOUS

## 2011-04-13 MED ORDER — CIPROFLOXACIN IN D5W 400 MG/200ML IV SOLN
400.0000 mg | INTRAVENOUS | Status: AC
  Administered 2011-04-13: 400 mg via INTRAVENOUS

## 2011-04-13 MED ORDER — SODIUM CHLORIDE 0.9 % IR SOLN
Status: DC | PRN
  Administered 2011-04-13: 3000 mL

## 2011-04-13 MED ORDER — CIPROFLOXACIN IN D5W 400 MG/200ML IV SOLN
INTRAVENOUS | Status: AC
  Filled 2011-04-13: qty 200

## 2011-04-13 MED ORDER — LACTATED RINGERS IV SOLN
INTRAVENOUS | Status: DC | PRN
  Administered 2011-04-13: 10:00:00 via INTRAVENOUS

## 2011-04-13 MED ORDER — MITOMYCIN CHEMO FOR BLADDER INSTILLATION 40 MG: 40.0000 mg | Freq: Once | INTRAVENOUS | Status: DC

## 2011-04-13 MED ORDER — LIDOCAINE HCL (CARDIAC) 20 MG/ML IV SOLN
INTRAVENOUS | Status: DC | PRN
  Administered 2011-04-13: 100 mg via INTRAVENOUS

## 2011-04-13 MED ORDER — PHENAZOPYRIDINE HCL 200 MG PO TABS
ORAL_TABLET | ORAL | Status: AC
  Filled 2011-04-13: qty 1

## 2011-04-13 MED ORDER — LIDOCAINE HCL 2 % EX GEL
CUTANEOUS | Status: AC
  Filled 2011-04-13: qty 20

## 2011-04-13 MED ORDER — STERILE WATER FOR IRRIGATION IR SOLN
Status: DC | PRN
  Administered 2011-04-13: 3000 mL

## 2011-04-13 MED ORDER — BELLADONNA ALKALOIDS-OPIUM 16.2-60 MG RE SUPP
RECTAL | Status: AC
  Filled 2011-04-13: qty 1

## 2011-04-13 MED ORDER — LACTATED RINGERS IV SOLN: INTRAVENOUS | Status: DC

## 2011-04-13 MED ORDER — ACETAMINOPHEN 10 MG/ML IV SOLN
INTRAVENOUS | Status: AC
  Filled 2011-04-13: qty 100

## 2011-04-13 MED ORDER — INDIGOTINDISULFONATE SODIUM 8 MG/ML IJ SOLN
INTRAMUSCULAR | Status: AC
  Filled 2011-04-13: qty 5

## 2011-04-13 MED ORDER — MITOMYCIN CHEMO FOR BLADDER INSTILLATION 40 MG
40.0000 mg | Freq: Once | INTRAVENOUS | Status: AC
  Administered 2011-04-13: 40 mg via INTRAVESICAL
  Filled 2011-04-13: qty 40

## 2011-04-13 SURGICAL SUPPLY — 28 items
BAG URINE DRAINAGE (UROLOGICAL SUPPLIES) IMPLANT
BAG URO CATCHER STRL LF (DRAPE) IMPLANT
CATH FOLEY 2WAY SLVR  5CC 18FR (CATHETERS) ×1
CATH FOLEY 2WAY SLVR 5CC 18FR (CATHETERS) ×1 IMPLANT
CLOTH BEACON ORANGE TIMEOUT ST (SAFETY) ×2 IMPLANT
DEFLUX NEEDLE (NEEDLE) IMPLANT
DEFLUX SYRINGE (SYRINGE) IMPLANT
DRAPE CAMERA CLOSED 9X96 (DRAPES) ×2 IMPLANT
ELECT REM PT RETURN 9FT ADLT (ELECTROSURGICAL) ×2
ELECTRODE REM PT RTRN 9FT ADLT (ELECTROSURGICAL) ×1 IMPLANT
GLOVE BIOGEL M STRL SZ7.5 (GLOVE) ×2 IMPLANT
GOWN STRL NON-REIN LRG LVL3 (GOWN DISPOSABLE) ×2 IMPLANT
GOWN STRL REIN XL XLG (GOWN DISPOSABLE) ×2 IMPLANT
HOLDER FOLEY CATH W/STRAP (MISCELLANEOUS) IMPLANT
KIT ASPIRATION TUBING (SET/KITS/TRAYS/PACK) ×2 IMPLANT
LOOPS RESECTOSCOPE DISP (ELECTROSURGICAL) ×2 IMPLANT
MANIFOLD NEPTUNE II (INSTRUMENTS) ×2 IMPLANT
NDL SAFETY ECLIPSE 18X1.5 (NEEDLE) IMPLANT
NEEDLE HYPO 18GX1.5 SHARP (NEEDLE)
NEEDLE SPNL 22GX7 QUINCKE BK (NEEDLE) ×2 IMPLANT
NS IRRIG 1000ML POUR BTL (IV SOLUTION) IMPLANT
PACK CYSTO (CUSTOM PROCEDURE TRAY) ×2 IMPLANT
PLUG CATH AND CAP STER (CATHETERS) ×2 IMPLANT
SCRUB PCMX 4 OZ (MISCELLANEOUS) ×2 IMPLANT
SYR CONTROL 10ML LL (SYRINGE) IMPLANT
SYRINGE IRR TOOMEY STRL 70CC (SYRINGE) IMPLANT
TUBING CONNECTING 10 (TUBING) ×2 IMPLANT
WATER STERILE IRR 3000ML UROMA (IV SOLUTION) ×2 IMPLANT

## 2011-04-13 NOTE — Preoperative (Signed)
Beta Blockers   Reason not to administer Beta Blockers:Not Applicable 

## 2011-04-13 NOTE — Progress Notes (Signed)
Mitomycin instilled in bladder by Dr Gaynelle Arabian

## 2011-04-13 NOTE — Discharge Instructions (Signed)
Transurethral Resection, Bladder Tumor A cancerous growth (tumor) can develop on the inside wall of the bladder. The bladder is the organ that holds urine. One way to remove the tumor is a procedure called a transurethral resection. The tumor is removed (resected) through the tube that carries urine from the bladder out of the body (urethra). No cuts (incisions) are made in the skin. Instead, the procedure is done through a thin telescope, called a resectoscope. Attached to it is a light and usually a tiny camera. The resectoscope is put into the urethra. In men, the urethra opens at the end of the penis. In women, it opens just above the vagina.  A transurethral resection is usually used to remove tumors that have not gotten too big or too deep. These are called Stage 0, Stage 1 or Stage 2 bladder cancers. LET YOUR CAREGIVER KNOW ABOUT:  On the day of the procedure, your caregivers will need to know the last time you had anything to eat or drink. This includes water, gum, and candy. In advance, make sure they know about:   Any allergies.   All medications you are taking, including:   Herbs, eyedrops, over-the-counter medications and creams.   Blood thinners (anticoagulants), aspirin or other drugs that could affect blood clotting.   Use of steroids (by mouth or as creams).   Previous problems with anesthetics, including local anesthetics.   Possibility of pregnancy, if this applies.   Any history of blood clots.   Any history of bleeding or other blood problems.   Previous surgery.   Smoking history.   Any recent symptoms of colds or infections.   Other health problems.  RISKS AND COMPLICATIONS This is usually a safe procedure. Every procedure has risks, though. For a transurethral resection, they include:  Infection. Antibiotic medication would need to be taken.   Bleeding.   Light bleeding may last for several days after the procedure.   If bleeding continues or is heavy,  the bladder may need rinsing. Or, a new catheter might be put in for awhile.   Sometimes bed rest is needed.   Urination problems.   Pain and burning can occur when urinating. This usually goes away in a few days.   Scarring from the procedure can block the flow of urine.   Bladder damage.   It can be punctured or torn during removal of the tumor. If this happens, a catheter might be needed for longer. Antibiotics would be taken while the bladder heals.   Urine can leak through the hole or tear into the abdomen. If this happens, surgery may be needed to repair the bladder.  BEFORE THE PROCEDURE   A medical evaluation will be done. This may include:   A physical examination.   Urine test. This is to make sure you do not have a urinary tract infection.   Blood tests.   A test that checks the heart's rhythm (electrocardiogram).   Talking with an anesthesiologist. This is the person who will be in charge of the medication (anesthesia) to keep you from feeling pain during the transurethral resection. You might be asleep during the procedure (general anesthesia) or numb from the waist down, but awake during the procedure (spinal anesthesia). Ask your surgeon what to expect.   The person who is having a transurethral resection needs to give what is called informed consent. This requires signing a legal paper that gives permission for the procedure. To give informed consent:   You must  understand how the procedure is done and why.   You must be told all the risks and benefits of the procedure.   You must sign the consent. Sometimes a legal guardian can do this.   Signing should be witnessed by a healthcare professional.   The day before the surgery, eat only a light dinner. Then, do not eat or drink anything for at least 8 hours before the surgery. Ask your caregiver if it is OK to take any needed medicines with a sip of water.   Arrive at least an hour before the surgery or whenever  your surgeon recommends. This will give you time to check in and fill out any needed paperwork.  PROCEDURE  The preparation:   You will change into a hospital gown.   A needle will be inserted in your arm. This is an intravenous access tube (IV). Medication will be able to flow directly into your body through this needle.   Small monitors will be put on your body. They are used to check your heart, blood pressure, and oxygen level.   You might be given medication that will help you relax (sedative).   You will be given a general anesthetic or spinal anesthesia.   The procedure:   Once you are asleep or numb from the waist down, your legs will be placed in stirrups.   The resectoscope will be passed through the urethra into the bladder.   Fluid will be passed through the resectoscope. This will fill the bladder with water.   The surgeon will examine the bladder through the scope. If the scope has a camera, it can take pictures from inside the bladder. They can be projected onto a TV screen.   The surgeon will use various tools to remove the tumor in small pieces. Sometimes a laser (a beam of light energy) is used. Other tools may use electric current.   A tube (catheter) will often be placed so that urine can drain into a bag outside the body. This process helps stop bleeding. This tube keeps blood clots from blocking the urethra.   The procedure usually takes 30 to 45 minutes.  AFTER THE PROCEDURE   You will stay in a recovery area until the anesthesia has worn off. Your blood pressure and pulse will be checked every so often. Then you will be taken to a hospital room.   You may continue to get fluids through the IV for awhile.   Some pain is normal. The catheter might be uncomfortable. Pain is usually not severe. If it is, ask for pain medicine.   Your urine may look bloody after a transurethral resection. This is normal.   If bleeding is heavy, a hospital caregiver may rinse  out the bladder (irrigation) through the catheter.   Once the urine is clear, the catheter will be taken out.   You will need to stay in the hospital until you can urinate on your own.   Most people stay in the hospital for up to 4 days.  PROGNOSIS   Transurethral resection is considered the best way to treat bladder tumors that are not too far along. For most people, the treatment is successful. Sometimes, though, more treatment is needed.   Bladder cancers can come back even after a successful procedure. Because of this, be sure to have a checkup with your caregiver every 3 to 6 months. If everything is OK for 3 years, you can reduce the checkups to once a  year.  Document Released: 10/15/2008 Document Revised: 12/08/2010 Document Reviewed: 10/15/2008 [facility] Patient Information 2012 [place], [place].Bladder Cancer Bladder cancer is an abnormal growth of tissue in your bladder. Your bladder is the balloon-like sack in your pelvis. It collects and stores urine that comes from the kidneys through the ureters. The ureters are 2 small muscular tubes that carry the urine from the kidneys to the bladder. When you feel the urge to pass your water (urinate), the urine in the bladder is passed out through the urethra. The bladder wall is made of layers. If cancer spreads into these layers and through the wall of the bladder, it becomes more difficult to treat.  CAUSES  The exact cause of bladder cancer is not known. There is a number of risk factors that can increase your chances of getting bladder cancer. They include:  Smoking. Smokers are more than twice as likely to get bladder cancer as nonsmokers.   Occupational exposures. Workers in a number of industries are at higher risk of bladder cancer. These include:   Rubber, Brewing technologist, textile, and paint production workers.   Painters, hairdressers, Materials engineer, Pension scheme manager, and truck drivers.   The combination of industrial exposure and smoking further  increases risk.   Race. Whites are about twice as likely to develop bladder cancer than African Americans and Hispanics. Asians have the lowest risk of bladder cancer.   Age. The risk of bladder cancer increases with age. Over half of the people with bladder cancer are over 49 years of age.   Sex. Men are 4 times more likely than women to get bladder cancer.   Chronic bladder inflammation. Urinary infections, kidney, and bladder stones and other causes of chronic bladder irritation are linked with bladder cancer. They do not necessarily cause bladder cancer.   Bladder cancer history. Once you have a bladder cancer, you are at higher risk of having another bladder cancer (even if the first cancer was completely treated).   Passed down by parents (heredity). Some bladder cancers seem to be inherited.   Chemotherapy and radiation therapy:   Cytoprotective medications may be used to protect the bladder from irritation and reduce the risk of bladder cancer.   People who receive radiation treatment to the pelvis are more likely to develop bladder cancer.   Arsenic. Arsenic in drinking water has been associated with an increased risk of bladder cancer.  In certain parts of the world (not the [place] a parasite called schistosomiasis is strongly associated with bladder cancer. This form of bladder cancer is not discussed here.  SYMPTOMS   Blood in the urine (hematuria) is the main symptom of bladder cancer. Blood may be present only in small amounts detectable under a microscope, or may cause the urine to actually appear bloody.   Irritation with urination.   Frequent bladder or urine infections.  DIAGNOSIS  Your caregiver may suspect bladder cancer based on your description of urinary symptomsor based on the finding of blood or infection in the urine (especially if this has recurred several times). A specialist will perform a test by inserting a narrow flexible or rigid tube into your bladder  through your penis or urethra (cystoscopy). They will use light anaesthesia during this test. This test searches the lining of your bladder for tumors. A biopsy will be done to sample the tumor. The sample will be examined under a microscope to see if cancer is present.  If a cancer is present, it will then be staged to determine its  severity and extent. It is important to know how deeply into the bladder wall the cancer has grown, and whether the cancer has spread to any other parts of your body. Staging may require blood tests or special scans. TREATMENT  Once your cancer has been diagnosed and staged, you should discuss a treatment plan with your caregiver. Based on the stage of the cancer, one treatment or a combination of treatments may be recommended. The most common forms of treatment are:  Surgery.   Radiation Therapy.   Chemotherapy.   Immunotherapy.  When bladder cancer is caught early, the cancer can often be removed via cystoscopy, and no other treatment is necessary. Ongoing monitoring for recurrence is necessary.  HOME CARE INSTRUCTIONS   Take any prescription medications exactly as directed.   Keep all testing and specialist follow-up appointments.   If you have had a cystoscopy, you may have some blood in your urine after the test. This should clear up. You may also have some irritation with urination. If you were prescribed an antibiotic or pain medication, take it as directed.  SEEK MEDICAL CARE IF:   You develop increased pain, painful urination, or fever after a cystoscopy.   Your urine becomes bloody after initially improving.  SEEK IMMEDIATE MEDICAL CARE IF:  You develop a fever with chills with or without back or flank pain.  Document Released: 12/22/2002 Document Revised: 12/08/2010 Document Reviewed: 08/02/2007 [facility] Patient Information 2012 [place].

## 2011-04-13 NOTE — H&P (Signed)
Active Problems Problems  1. Bladder Cancer 188.9 2. Gross Hematuria 599.71 3. Neoplasm Of The Bladder 239.4  History of Present Illness       78 yo married male returns today for cystoscopy after completion of full strength BCG/Instron A x 6 weeks on 02/23/11.  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     Originally referred by Dr. Rosario Jacks for gross hematuria evaluation.  He has dysuria, frequency & nocturia.  50 pki/yr hx tobacco. None x 10 yrs.   12/15/10  NMP22 - positive     Patient has had a CT scan on 12/16/10:  1.  2.9 x 2.7cm lesion in the Lt posterior bladder wall                                                                2. Bilateral renal stones w/o obstruction. (8mmR and 79mm L)                                                               3. Abnormal proximal colon ( ? segmental diverticulitis)                                                               4. AA Ca++ with ectasia   Past Medical History Problems  1. History of  Arthritis V13.4 2. History of  Atrial Fibrillation 427.31 3. History of  Glaucoma 365.9 4. History of  Hypercholesterolemia 272.0 5. History of  Murmurs 785.2 6. History of  Nephrolithiasis V13.01  Surgical History Problems  1. History of  Appendectomy 2. History of  Cystoscopy With Fulguration Medium Lesion (2-5cm) 3. History of  Previous Stent Placement  Current Meds 1. Cozaar 25 MG Oral Tablet; Therapy: (Recorded:13Dec2012) to 2. Dexilant CPDR; Therapy: (Recorded:13Dec2012) to 3. Hydrochlorothiazide 25 MG Oral Tablet; Therapy: (Recorded:13Dec2012) to 4. Isosorbide Mononitrate ER 30 MG Oral Tablet Extended Release 24 Hour; Therapy: [id number] to 5. Latanoprost 0.005 % Ophthalmic Solution; Therapy: [id number] to 6. Losartan Potassium 50 MG Oral Tablet; Therapy: [id number] to 7. Lovastatin TABS; Therapy: (Recorded:13Dec2012) to 8. Ranexa 500 MG Oral Tablet Extended Release 12 Hour; Therapy:  [id number] to 9. Singulair TABS; as needed; Therapy: ([id number]) to 10. Timolol Maleate 0.5 % Ophthalmic Solution; Therapy: [id number] to  Allergies Medication  1. Penicillins  Family History Problems  1. Maternal history of  Acute Myocardial Infarction V17.3 2. Paternal history of  Acute Myocardial Infarction V17.3 3. Family history of  Family Health Status Number Of Children 1 son 4. Family history of  Father Deceased At Age 64 heart attack 5. Family history of  Mother Deceased At Age 69 heart attack  Social History Problems  1. Former Smoker V15.82 1 ppd x 51yrs, quit 69yrs ago 2. Marital History - Currently Married 3. Occupation: Retired Denied  4. History  of  Alcohol Use 5. History of  Caffeine Use  Review of Systems Genitourinary, constitutional, skin, eye, otolaryngeal, hematologic/lymphatic, cardiovascular, pulmonary, endocrine, musculoskeletal, gastrointestinal, neurological and psychiatric system(s) were reviewed and pertinent findings if present are noted.    Vitals Vital Signs [Data Includes: Last 1 Day]  [id number] 02:53PM  Blood Pressure: 164 / 74 Temperature: 97.5 F Heart Rate: 71  Physical Exam Constitutional: Well nourished and well developed . No acute distress.  ENT:. The ears and nose are normal in appearance. Hearing loss is noted.  Neck: The appearance of the neck is normal.  Pulmonary: No respiratory distress.  Abdomen: The abdomen is mildly obese, but not distended. The abdomen is soft and nontender. No masses are palpated. No CVA tenderness. Bowel sounds are normal. No hernias are palpable. No hepatosplenomegaly noted.  Genitourinary: Examination of the penis demonstrates no discharge, no masses, no lesions and a normal meatus. The scrotum is without lesions. The right epididymis is palpably normal and non-tender. The left epididymis is palpably normal and non-tender. The right testis is non-tender and without masses. The left testis is  non-tender and without masses.  Lymphatics: The femoral and inguinal nodes are not enlarged or tender.  Skin: Normal skin turgor, no visible rash and no visible skin lesions.  Neuro/Psych:. Mood and affect are appropriate.    Results/Data Urine [Data Includes: Last 1 Day]   [id number]  COLOR YELLOW   APPEARANCE CLEAR   SPECIFIC GRAVITY 1.015   pH 6.0   GLUCOSE NEG mg/dL  BILIRUBIN NEG   KETONE NEG mg/dL  BLOOD TRACE   PROTEIN NEG mg/dL  UROBILINOGEN 0.2 mg/dL  NITRITE NEG   LEUKOCYTE ESTERASE NEG   SQUAMOUS EPITHELIAL/HPF RARE   WBC 0-3 WBC/hpf  RBC 0-3 RBC/hpf  BACTERIA NONE SEEN   CRYSTALS NONE SEEN   CASTS NONE SEEN    Procedure  Procedure: Cystoscopy  Indication: History of Urothelial Carcinoma.  Informed Consent: Risks, benefits, and potential adverse events were discussed and informed consent was obtained from the patient . Specific risks including, but not limited to bleeding, infection, pain, allergic reaction etc. were explained.  Prep: The patient was prepped with betadine.  Anesthesia:. Local anesthesia was administered intraurethrally with 2% lidocaine jelly.  Antibiotic prophylaxis: Ciprofloxacin.  Procedure Note:  Urethral meatus:. No abnormalities.  Anterior urethra: No abnormalities.  Prostatic urethra: No abnormalities . The lateral prostatic lobes were enlarged. No intravesical median lobe was visualized.  Bladder: Visulization was clear. The ureteral orifices were in the normal anatomic position bilaterally. A systematic survey of the bladder demonstrated no bladder tumors or stones. A solitary tumor was visualized in the bladder. A papillary tumor was seen in the bladder. This tumor was located on the right side, on the anterior aspect, near the dome of the bladder. The patient tolerated the procedure well.  Complications: None.    Assessment Assessed  1. Bladder Cancer 188.9 2. Gross Hematuria 599.71 3. Neoplasm Of The Bladder 239.4   Recurrence of  TCC in pt with hx of high grade TCC. I have explained to him that he needs TURBT and MItomycin C instillation. All can be done as outpatient. Will leave foley in place only for 30 minutes for mitomycin.   Plan Bladder Cancer (188.9)  1. Follow-up Schedule Surgery Office  Follow-up  Requested for: 27Mar2013 Health Maintenance (V70.0)  2. UA With REFLEX  Done: [id number] 02:31PM   TURBT/Mitomycin-C.   Signatures

## 2011-04-13 NOTE — Interval H&P Note (Signed)
History and Physical Interval Note:  04/13/2011 8:37 AM  Jacob [name]  has presented today for surgery, with the diagnosis of recurrent bladder tumor  The various methods of treatment have been discussed with the patient and family. After consideration of risks, benefits and other options for treatment, the patient has consented to  Procedure(s) (LRB): TRANSURETHRAL RESECTION OF BLADDER TUMOR (TURBT) (N/A) CYSTOSCOPY WITH BIOPSY (N/A) CYSTOSCOPY WITH INJECTION (N/A) as a surgical intervention .  The patients' history has been reviewed, patient examined, no change in status, stable for surgery.  I have reviewed the patients' chart and labs.  Questions were answered to the patient's satisfaction.     [name] I

## 2011-04-13 NOTE — Op Note (Addendum)
Pre-operative diagnosis : Recurrent TCC bladder  Postoperative diagnosis: Same  Operation: Cold cup excision and cauterization of bx sites.   Surgeon:  Chauncey Cruel. Gaynelle Arabian, MD  First assistant:none  Anesthesia:  Gen LMA  Preparation: After appropriate pre-anesthesia and bracelet identification, the pt was brought to the OR and placed upon the OR table in the supine position, where Gen LMA anesthesia was administered, and then he was replaced in the dorsal supine position. The pubis was prepped with Betadine solution and draped in the usual fashion.  Review history: Hx high grade non-invasive TCC, post TURBT and Mitomycin instillation, now with 3 area of tumor recurrence, each < 1cm.   Statement of  Likelihood of Success: Excellent. TIME-OUT observed.:  [id number] passed, and 3 small tumors identified on the posterior anterior bladder wall close to the midline. Two tumors were removed and all sites were cauterized, Mitomycin was brought to the recovery room for instillation. !6 F foley cath was placed for chemotherapy instillation.  Note: In Recovery room, Mitomycin C instilled by myself  using pharmacy protocol for instillation via foley cath. Instillation sill be held for 45 minutes and then drained.

## 2011-04-13 NOTE — Anesthesia Postprocedure Evaluation (Signed)
Anesthesia Post Note  Patient: Jacob [name]  Procedure(s) Performed: Procedure(s) (LRB): CYSTOSCOPY WITH BIOPSY (N/A)  Anesthesia type: General  Patient location: PACU  Post pain: Pain level controlled  Post assessment: Post-op Vital signs reviewed  Last Vitals:  Filed Vitals:   04/13/11 1245  BP: 149/61  Pulse: 63  Temp:   Resp: 16    Post vital signs: Reviewed  Level of consciousness: sedated  Complications: No apparent anesthesia complications

## 2011-04-13 NOTE — Anesthesia Preprocedure Evaluation (Addendum)
Anesthesia Evaluation  Patient identified by MRN, date of birth, ID band Patient awake    Reviewed: Allergy & Precautions, H&P , NPO status , Patient's Chart, lab work & pertinent test results  History of Anesthesia Complications Negative for: history of anesthetic complications  Airway Mallampati: II TM Distance: >3 FB Neck ROM: Full    Dental  (+) Edentulous Upper and Edentulous Lower   Pulmonary neg pulmonary ROS, sleep apnea (No prior evaluation; diagnosis per Stop Bang score 4) ,  breath sounds clear to auscultation  Pulmonary exam normal       Cardiovascular hypertension, Pt. on medications + CAD (stenting in the past) + dysrhythmias Rhythm:Regular Rate:Normal     Neuro/Psych negative neurological ROS  negative psych ROS   GI/Hepatic Neg liver ROS, hiatal hernia, GERD-  Medicated and Controlled,  Endo/Other  negative endocrine ROS  Renal/GU Renal InsufficiencyRenal diseasenegative Renal ROS  negative genitourinary   Musculoskeletal negative musculoskeletal ROS (+)   Abdominal   Peds  Hematology negative hematology ROS (+)   Anesthesia Other Findings   Reproductive/Obstetrics negative OB ROS                          Anesthesia Physical Anesthesia Plan  ASA: III  Anesthesia Plan: General   Post-op Pain Management:    Induction: Intravenous  Airway Management Planned: LMA  Additional Equipment:   Intra-op Plan:   Post-operative Plan: Extubation in OR  Informed Consent: I have reviewed the patients History and Physical, chart, labs and discussed the procedure including the risks, benefits and alternatives for the proposed anesthesia with the patient or authorized representative who has indicated his/her understanding and acceptance.   Dental advisory given  Plan Discussed with: CRNA  Anesthesia Plan Comments:         Anesthesia Quick Evaluation                                   Anesthesia Evaluation  Patient identified by MRN, date of birth, ID band Patient awake    Reviewed: Allergy & Precautions, H&P , NPO status , Patient's Chart, lab work & pertinent test results  Airway Mallampati: II TM Distance: >3 FB Neck ROM: full    Dental  (+) Edentulous Upper and Edentulous Lower   Pulmonary neg pulmonary ROS,  clear to auscultation  Pulmonary exam normal       Cardiovascular Exercise Tolerance: Good hypertension, On Medications + CAD, + Cardiac Stents and neg cardio ROS regular Normal Stents 2006.  Ok since.  No CP   Neuro/Psych Negative Neurological ROS  Negative Psych ROS   GI/Hepatic negative GI ROS, Neg liver ROS, hiatal hernia, GERD-  Medicated and Controlled,  Endo/Other  Negative Endocrine ROS  Renal/GU negative Renal ROS  Genitourinary negative   Musculoskeletal   Abdominal   Peds  Hematology negative hematology ROS (+)   Anesthesia Other Findings   Reproductive/Obstetrics negative OB ROS                          Anesthesia Physical Anesthesia Plan  ASA: III  Anesthesia Plan: General   Post-op Pain Management:    Induction: Intravenous  Airway Management Planned: LMA  Additional Equipment:   Intra-op Plan:   Post-operative Plan:   Informed Consent: I have reviewed the patients History and Physical, chart, labs and discussed the procedure including  the risks, benefits and alternatives for the proposed anesthesia with the patient or authorized representative who has indicated his/her understanding and acceptance.   Dental Advisory Given  Plan Discussed with: CRNA and Surgeon  Anesthesia Plan Comments:         Anesthesia Quick Evaluation

## 2011-04-13 NOTE — Transfer of Care (Signed)
Immediate Anesthesia Transfer of Care Note  Patient: Jacob [name]  Procedure(s) Performed: Procedure(s) (LRB): CYSTOSCOPY WITH BIOPSY (N/A)  Patient Location: PACU  Anesthesia Type: General  Level of Consciousness: awake, alert  and oriented  Airway & Oxygen Therapy: Patient Spontanous Breathing and Patient connected to face mask oxygen  Post-op Assessment: Report given to PACU RN and Post -op Vital signs reviewed and stable  Post vital signs: Reviewed and stable  Complications: No apparent anesthesia complications

## 2011-04-13 NOTE — Interval H&P Note (Signed)
History and Physical Interval Note:  04/13/2011 10:39 AM  Jacob [name]  has presented today for surgery, with the diagnosis of recurrent bladder tumor  The various methods of treatment have been discussed with the patient and family. After consideration of risks, benefits and other options for treatment, the patient has consented to  Procedure(s) (LRB): TRANSURETHRAL RESECTION OF BLADDER TUMOR (TURBT) (N/A) CYSTOSCOPY WITH BIOPSY (N/A) CYSTOSCOPY WITH INJECTION (N/A) as a surgical intervention .  The patients' history has been reviewed, patient examined, no change in status, stable for surgery.  I have reviewed the patients' chart and labs.  Questions were answered to the patient's satisfaction.     [name] I

## 2011-04-26 ENCOUNTER — Encounter (HOSPITAL_COMMUNITY): Payer: Self-pay | Admitting: Urology

## 2011-07-07 ENCOUNTER — Emergency Department: Payer: Self-pay | Admitting: Emergency Medicine

## 2011-07-07 ENCOUNTER — Ambulatory Visit: Payer: Self-pay | Admitting: Internal Medicine

## 2011-07-07 LAB — URINALYSIS, COMPLETE
Bacteria: NONE SEEN
Bilirubin,UR: NEGATIVE
Blood: NEGATIVE
Ketone: NEGATIVE
Ph: 7 (ref 4.5–8.0)
Protein: NEGATIVE
RBC,UR: <1 /HPF (ref 0–5)
Squamous Epithelial: NONE SEEN

## 2011-07-07 LAB — COMPREHENSIVE METABOLIC PANEL
Albumin: 3.6 g/dL (ref 3.4–5.0)
Anion Gap: 8 (ref 7–16)
BUN: 17 mg/dL (ref 7–18)
Chloride: 100 mmol/L (ref 98–107)
Creatinine: 1.4 mg/dL — ABNORMAL HIGH (ref 0.60–1.30)
EGFR (African American): 55 — ABNORMAL LOW
Glucose: 115 mg/dL — ABNORMAL HIGH (ref 65–99)
Osmolality: 276 (ref 275–301)
Potassium: 3.8 mmol/L (ref 3.5–5.1)
SGOT(AST): 23 U/L (ref 15–37)
SGPT (ALT): 23 U/L
Sodium: 137 mmol/L (ref 136–145)

## 2011-07-07 LAB — CBC
HCT: 41.6 % (ref 40.0–52.0)
HGB: 13.7 g/dL (ref 13.0–18.0)
MCH: 32 pg (ref 26.0–34.0)
Platelet: 226 10*3/uL (ref 150–440)
RDW: 13.6 % (ref 11.5–14.5)
WBC: 5.8 10*3/uL (ref 3.8–10.6)

## 2011-07-13 LAB — CULTURE, BLOOD (SINGLE)

## 2011-07-25 ENCOUNTER — Other Ambulatory Visit: Payer: Self-pay | Admitting: Urology

## 2011-08-02 ENCOUNTER — Encounter (HOSPITAL_BASED_OUTPATIENT_CLINIC_OR_DEPARTMENT_OTHER): Payer: Self-pay | Admitting: *Deleted

## 2011-08-02 NOTE — Progress Notes (Addendum)
NPO AFTER MN. ARRIVES AT 0845. NEEDS ISTAT AND CXR. CURRENT EKG IN EPIC AND CHART. WILL TAKE DEXILANT AND DO NEBULIZER AM OF SURG W/ SIPS OF WATER.  REVIEWED CHART W/ DR EWELL MDA, OK TO PROCEED IF CXR IS OKAY DOS.

## 2011-08-04 ENCOUNTER — Ambulatory Visit (HOSPITAL_BASED_OUTPATIENT_CLINIC_OR_DEPARTMENT_OTHER)
Admission: RE | Admit: 2011-08-04 | Discharge: 2011-08-04 | Disposition: A | Discharge status: Home / Self Care | Payer: Medicare Other | Source: Ambulatory Visit | Attending: Urology | Admitting: Urology

## 2011-08-04 ENCOUNTER — Encounter (HOSPITAL_BASED_OUTPATIENT_CLINIC_OR_DEPARTMENT_OTHER)
Admission: RE | Disposition: A | Discharge status: Home / Self Care | Payer: Self-pay | Source: Ambulatory Visit | Attending: Urology

## 2011-08-04 ENCOUNTER — Encounter (HOSPITAL_BASED_OUTPATIENT_CLINIC_OR_DEPARTMENT_OTHER): Payer: Self-pay | Admitting: *Deleted

## 2011-08-04 ENCOUNTER — Ambulatory Visit (HOSPITAL_COMMUNITY)
Admission: RE | Admit: 2011-08-04 | Discharge: 2011-08-04 | Disposition: A | Discharge status: Home / Self Care | Payer: Medicare Other | Source: Ambulatory Visit | Attending: Anesthesiology | Admitting: Anesthesiology

## 2011-08-04 ENCOUNTER — Ambulatory Visit (HOSPITAL_BASED_OUTPATIENT_CLINIC_OR_DEPARTMENT_OTHER): Payer: Medicare Other | Admitting: Anesthesiology

## 2011-08-04 ENCOUNTER — Encounter (HOSPITAL_BASED_OUTPATIENT_CLINIC_OR_DEPARTMENT_OTHER): Payer: Self-pay | Admitting: Anesthesiology

## 2011-08-04 DIAGNOSIS — C671 Malignant neoplasm of dome of bladder: Secondary | ICD-10-CM | POA: Insufficient documentation

## 2011-08-04 DIAGNOSIS — I2510 Atherosclerotic heart disease of native coronary artery without angina pectoris: Secondary | ICD-10-CM | POA: Insufficient documentation

## 2011-08-04 DIAGNOSIS — G4730 Sleep apnea, unspecified: Secondary | ICD-10-CM | POA: Insufficient documentation

## 2011-08-04 DIAGNOSIS — E7800 Pure hypercholesterolemia, unspecified: Secondary | ICD-10-CM | POA: Insufficient documentation

## 2011-08-04 DIAGNOSIS — H409 Unspecified glaucoma: Secondary | ICD-10-CM | POA: Insufficient documentation

## 2011-08-04 DIAGNOSIS — Z8739 Personal history of other diseases of the musculoskeletal system and connective tissue: Secondary | ICD-10-CM | POA: Insufficient documentation

## 2011-08-04 DIAGNOSIS — Z79899 Other long term (current) drug therapy: Secondary | ICD-10-CM | POA: Insufficient documentation

## 2011-08-04 DIAGNOSIS — C679 Malignant neoplasm of bladder, unspecified: Secondary | ICD-10-CM

## 2011-08-04 DIAGNOSIS — K219 Gastro-esophageal reflux disease without esophagitis: Secondary | ICD-10-CM | POA: Insufficient documentation

## 2011-08-04 DIAGNOSIS — I4891 Unspecified atrial fibrillation: Secondary | ICD-10-CM | POA: Insufficient documentation

## 2011-08-04 DIAGNOSIS — N400 Enlarged prostate without lower urinary tract symptoms: Secondary | ICD-10-CM | POA: Insufficient documentation

## 2011-08-04 DIAGNOSIS — R310 Gross hematuria: Secondary | ICD-10-CM | POA: Insufficient documentation

## 2011-08-04 DIAGNOSIS — N289 Disorder of kidney and ureter, unspecified: Secondary | ICD-10-CM | POA: Insufficient documentation

## 2011-08-04 DIAGNOSIS — R011 Cardiac murmur, unspecified: Secondary | ICD-10-CM | POA: Insufficient documentation

## 2011-08-04 HISTORY — DX: Unspecified glaucoma: H40.9

## 2011-08-04 HISTORY — DX: Benign prostatic hyperplasia without lower urinary tract symptoms: N40.0

## 2011-08-04 HISTORY — DX: Personal history of other diseases of the circulatory system: Z86.79

## 2011-08-04 HISTORY — PX: TRANSURETHRAL RESECTION OF BLADDER TUMOR: SHX2575

## 2011-08-04 LAB — POCT I-STAT 4, (NA,K, GLUC, HGB,HCT)
HCT: 41 % (ref 39.0–52.0)
Hemoglobin: 13.9 g/dL (ref 13.0–17.0)

## 2011-08-04 SURGERY — TURBT (TRANSURETHRAL RESECTION OF BLADDER TUMOR)
Anesthesia: General | Site: Bladder | Wound class: Clean Contaminated

## 2011-08-04 MED ORDER — TRAMADOL-ACETAMINOPHEN 37.5-325 MG PO TABS: 1.0000 | ORAL_TABLET | Freq: Four times a day (QID) | ORAL | Status: AC | PRN

## 2011-08-04 MED ORDER — LACTATED RINGERS IV SOLN
INTRAVENOUS | Status: DC
  Administered 2011-08-04 (×2): via INTRAVENOUS

## 2011-08-04 MED ORDER — BELLADONNA ALKALOIDS-OPIUM 16.2-60 MG RE SUPP
RECTAL | Status: DC | PRN
  Administered 2011-08-04: 1 via RECTAL

## 2011-08-04 MED ORDER — LACTATED RINGERS IV SOLN: INTRAVENOUS | Status: DC

## 2011-08-04 MED ORDER — LIDOCAINE HCL (CARDIAC) 20 MG/ML IV SOLN
INTRAVENOUS | Status: DC | PRN
  Administered 2011-08-04: 75 mg via INTRAVENOUS

## 2011-08-04 MED ORDER — MEPERIDINE HCL 25 MG/ML IJ SOLN: 6.2500 mg | INTRAMUSCULAR | Status: DC | PRN

## 2011-08-04 MED ORDER — IPRATROPIUM-ALBUTEROL 0.5-2.5 (3) MG/3ML IN SOLN
3.0000 mL | Freq: Once | RESPIRATORY_TRACT | Status: AC
  Administered 2011-08-04: 3 mL via RESPIRATORY_TRACT

## 2011-08-04 MED ORDER — SODIUM CHLORIDE 0.9 % IR SOLN
Status: DC | PRN
  Administered 2011-08-04: 12000 mL

## 2011-08-04 MED ORDER — PHENAZOPYRIDINE HCL 200 MG PO TABS: 200.0000 mg | ORAL_TABLET | Freq: Three times a day (TID) | ORAL | Status: AC | PRN

## 2011-08-04 MED ORDER — PROPOFOL 10 MG/ML IV EMUL
INTRAVENOUS | Status: DC | PRN
  Administered 2011-08-04: 100 mg via INTRAVENOUS
  Administered 2011-08-04: 180 mg via INTRAVENOUS

## 2011-08-04 MED ORDER — PHENAZOPYRIDINE HCL 200 MG PO TABS
200.0000 mg | ORAL_TABLET | Freq: Three times a day (TID) | ORAL | Status: DC
  Administered 2011-08-04: 200 mg via ORAL

## 2011-08-04 MED ORDER — FENTANYL CITRATE 0.05 MG/ML IJ SOLN
25.0000 ug | INTRAMUSCULAR | Status: DC | PRN
  Administered 2011-08-04 (×2): 25 ug via INTRAVENOUS

## 2011-08-04 MED ORDER — HYDROCODONE-ACETAMINOPHEN 7.5-650 MG PO TABS: 1.0000 | ORAL_TABLET | Freq: Four times a day (QID) | ORAL | Status: AC | PRN

## 2011-08-04 MED ORDER — ACETAMINOPHEN 10 MG/ML IV SOLN
INTRAVENOUS | Status: DC | PRN
  Administered 2011-08-04: 1000 mg via INTRAVENOUS

## 2011-08-04 MED ORDER — PROMETHAZINE HCL 25 MG/ML IJ SOLN: 6.2500 mg | INTRAMUSCULAR | Status: DC | PRN

## 2011-08-04 MED ORDER — FENTANYL CITRATE 0.05 MG/ML IJ SOLN
INTRAMUSCULAR | Status: DC | PRN
  Administered 2011-08-04 (×2): 25 ug via INTRAVENOUS
  Administered 2011-08-04: 50 ug via INTRAVENOUS

## 2011-08-04 MED ORDER — CIPROFLOXACIN IN D5W 400 MG/200ML IV SOLN
400.0000 mg | INTRAVENOUS | Status: AC
  Administered 2011-08-04: 400 mg via INTRAVENOUS

## 2011-08-04 MED ORDER — STERILE WATER FOR IRRIGATION IR SOLN
Status: DC | PRN
  Administered 2011-08-04: 10 mL via INTRAVESICAL

## 2011-08-04 SURGICAL SUPPLY — 42 items
BAG DRAIN URO-CYSTO SKYTR STRL (DRAIN) ×3 IMPLANT
BAG URINE DRAINAGE (UROLOGICAL SUPPLIES) ×3 IMPLANT
BAG URINE LEG 19OZ MD ST LTX (BAG) ×3 IMPLANT
BLADE SURG 15 STRL LF DISP TIS (BLADE) IMPLANT
BLADE SURG 15 STRL SS (BLADE)
BOOTIES KNEE HIGH SLOAN (MISCELLANEOUS) ×3 IMPLANT
CANISTER SUCT LVC 12 LTR MEDI- (MISCELLANEOUS) ×6 IMPLANT
CATH AINSWORTH 30CC 24FR (CATHETERS) ×3 IMPLANT
CATH FOLEY 2WAY SLVR  5CC 14FR (CATHETERS)
CATH FOLEY 2WAY SLVR  5CC 20FR (CATHETERS)
CATH FOLEY 2WAY SLVR  5CC 22FR (CATHETERS)
CATH FOLEY 2WAY SLVR 5CC 14FR (CATHETERS) IMPLANT
CATH FOLEY 2WAY SLVR 5CC 20FR (CATHETERS) IMPLANT
CATH FOLEY 2WAY SLVR 5CC 22FR (CATHETERS) IMPLANT
CATH HEMA 3WAY 30CC 24FR COUDE (CATHETERS) IMPLANT
CATH HEMA 3WAY 30CC 24FR RND (CATHETERS) IMPLANT
CATH SIMPLASTIC 24 30ML (CATHETERS) IMPLANT
CLOTH BEACON ORANGE TIMEOUT ST (SAFETY) ×3 IMPLANT
DRAPE CAMERA CLOSED 9X96 (DRAPES) ×3 IMPLANT
ELECT BUTTON HF 24-28F 2 30DE (ELECTRODE) ×3 IMPLANT
ELECT LOOP HF 24-28F (CUTTING LOOP) IMPLANT
ELECT LOOP HF 26F 30D .35MM (CUTTING LOOP) IMPLANT
ELECT LOOP MED HF 24F 12D CBL (CLIP) ×3 IMPLANT
ELECT NEEDLE 45D HF 24-28F 12D (CUTTING LOOP) IMPLANT
ELECT REM PT RETURN 9FT ADLT (ELECTROSURGICAL) ×3
ELECTRODE REM PT RTRN 9FT ADLT (ELECTROSURGICAL) ×2 IMPLANT
GLOVE BIO SURGEON STRL SZ 6.5 (GLOVE) ×3 IMPLANT
GLOVE BIO SURGEON STRL SZ7.5 (GLOVE) ×3 IMPLANT
GLOVE INDICATOR 6.5 STRL GRN (GLOVE) ×3 IMPLANT
GOWN PREVENTION PLUS LG XLONG (DISPOSABLE) ×3 IMPLANT
GOWN STRL REIN XL XLG (GOWN DISPOSABLE) ×3 IMPLANT
GOWN XL W/COTTON TOWEL STD (GOWNS) ×3 IMPLANT
HOLDER FOLEY CATH W/STRAP (MISCELLANEOUS) IMPLANT
KIT ASPIRATION TUBING (SET/KITS/TRAYS/PACK) ×3 IMPLANT
LOOP CUTTING 24FR OLYMPUS (CUTTING LOOP) IMPLANT
PACK CYSTOSCOPY (CUSTOM PROCEDURE TRAY) ×3 IMPLANT
PLUG CATH AND CAP STER (CATHETERS) IMPLANT
SET ASPIRATION TUBING (TUBING) IMPLANT
SUT ETHILON 3 0 PS 1 (SUTURE) IMPLANT
SUT SILK 0 TIES 10X30 (SUTURE) IMPLANT
SYR 30ML LL (SYRINGE) ×3 IMPLANT
SYRINGE IRR TOOMEY STRL 70CC (SYRINGE) ×3 IMPLANT

## 2011-08-04 NOTE — Transfer of Care (Signed)
Immediate Anesthesia Transfer of Care Note  Patient: Jacob [name]  Procedure(s) Performed: Procedure(s) (LRB): TRANSURETHRAL RESECTION OF BLADDER TUMOR (TURBT) (N/A)  Patient Location: Patient transported to PACU with oxygen via face mask at 6 Liters / Min  Anesthesia Type: General  Level of Consciousness: awake and alert   Airway & Oxygen Therapy: Patient Spontanous Breathing and Patient connected to face mask oxygen  Post-op Assessment: Report given to PACU RN and Post -op Vital signs reviewed and stable  Post vital signs: Reviewed and stable  Dentition: Teeth and oropharynx remain in pre-op condition  Complications: No apparent anesthesia complications

## 2011-08-04 NOTE — Progress Notes (Signed)
Dr. Gaynelle Arabian called to clarify order for tramadol and inform him patient urine in cath. Bloody.   Stated did not need tramadol and irrigated cath. With normal saline.  Irrigated with saline only one small clot resulted.  Saline enter cath with ease and full volume on return.  Urine slight pink on discharge.

## 2011-08-04 NOTE — Anesthesia Preprocedure Evaluation (Signed)
Anesthesia Evaluation  Patient identified by MRN, date of birth, ID band Patient awake    Reviewed: Allergy & Precautions, H&P , NPO status , Patient's Chart, lab work & pertinent test results  History of Anesthesia Complications Negative for: history of anesthetic complications  Airway Mallampati: II TM Distance: >3 FB Neck ROM: Full    Dental  (+) Edentulous Upper and Edentulous Lower   Pulmonary neg pulmonary ROS, sleep apnea (No prior evaluation; diagnosis per Stop Bang score 4) ,  breath sounds clear to auscultation  Pulmonary exam normal       Cardiovascular hypertension, Pt. on medications + CAD (stenting in the past) + dysrhythmias Rhythm:Regular Rate:Normal     Neuro/Psych negative neurological ROS  negative psych ROS   GI/Hepatic Neg liver ROS, hiatal hernia, GERD-  Medicated and Controlled,  Endo/Other  negative endocrine ROS  Renal/GU Renal InsufficiencyRenal diseasenegative Renal ROS  negative genitourinary   Musculoskeletal negative musculoskeletal ROS (+)   Abdominal   Peds  Hematology negative hematology ROS (+)   Anesthesia Other Findings   Reproductive/Obstetrics negative OB ROS                           Anesthesia Physical  Anesthesia Plan  ASA: III  Anesthesia Plan: General   Post-op Pain Management:    Induction: Intravenous  Airway Management Planned: LMA  Additional Equipment:   Intra-op Plan:   Post-operative Plan: Extubation in OR  Informed Consent: I have reviewed the patients History and Physical, chart, labs and discussed the procedure including the risks, benefits and alternatives for the proposed anesthesia with the patient or authorized representative who has indicated his/her understanding and acceptance.   Dental advisory given  Plan Discussed with: CRNA  Anesthesia Plan Comments:         Anesthesia Quick Evaluation                                   Anesthesia Evaluation  Patient identified by MRN, date of birth, ID band Patient awake    Reviewed: Allergy & Precautions, H&P , NPO status , Patient's Chart, lab work & pertinent test results  Airway Mallampati: II TM Distance: >3 FB Neck ROM: full    Dental  (+) Edentulous Upper and Edentulous Lower   Pulmonary neg pulmonary ROS,  clear to auscultation  Pulmonary exam normal       Cardiovascular Exercise Tolerance: Good hypertension, On Medications + CAD, + Cardiac Stents and neg cardio ROS regular Normal Stents 2006.  Ok since.  No CP   Neuro/Psych Negative Neurological ROS  Negative Psych ROS   GI/Hepatic negative GI ROS, Neg liver ROS, hiatal hernia, GERD-  Medicated and Controlled,  Endo/Other  Negative Endocrine ROS  Renal/GU negative Renal ROS  Genitourinary negative   Musculoskeletal   Abdominal   Peds  Hematology negative hematology ROS (+)   Anesthesia Other Findings   Reproductive/Obstetrics negative OB ROS                          Anesthesia Physical Anesthesia Plan  ASA: III  Anesthesia Plan: General   Post-op Pain Management:    Induction: Intravenous  Airway Management Planned: LMA  Additional Equipment:   Intra-op Plan:   Post-operative Plan:   Informed Consent: I have reviewed the patients History and Physical, chart, labs and discussed the  procedure including the risks, benefits and alternatives for the proposed anesthesia with the patient or authorized representative who has indicated his/her understanding and acceptance.   Dental Advisory Given  Plan Discussed with: CRNA and Surgeon  Anesthesia Plan Comments:         Anesthesia Quick Evaluation

## 2011-08-04 NOTE — Addendum Note (Signed)
Addendum  created 08/04/11 1210 by Montez Hageman, MD   Modules edited:Anesthesia Responsible Staff

## 2011-08-04 NOTE — Anesthesia Postprocedure Evaluation (Signed)
  Anesthesia Post-op Note  Patient: Jacob [name]  Procedure(s) Performed: Procedure(s) (LRB): TRANSURETHRAL RESECTION OF BLADDER TUMOR (TURBT) (N/A)  Patient Location: PACU  Anesthesia Type: General  Level of Consciousness: awake and alert   Airway and Oxygen Therapy: Patient Spontanous Breathing  Post-op Pain: mild  Post-op Assessment: Post-op Vital signs reviewed, Patient's Cardiovascular Status Stable, Respiratory Function Stable, Patent Airway and No signs of Nausea or vomiting  Post-op Vital Signs: stable  Complications: No apparent anesthesia complications \

## 2011-08-04 NOTE — Interval H&P Note (Signed)
History and Physical Interval Note:  08/04/2011 10:55 AM  Jacob [name]  has presented today for surgery, with the diagnosis of bph and bladder tumor  The various methods of treatment have been discussed with the patient and family. After consideration of risks, benefits and other options for treatment, the patient has consented to  Procedure(s) (LRB): TRANSURETHRAL RESECTION OF BLADDER TUMOR (TURBT) (N/A) TRANSURETHRAL RESECTION OF THE PROSTATE (TURP) (N/A) as a surgical intervention .  The patient's history has been reviewed, patient examined, no change in status, stable for surgery.  I have reviewed the patient's chart and labs.  Questions were answered to the patient's satisfaction.     [name] I

## 2011-08-04 NOTE — Anesthesia Procedure Notes (Signed)
Procedure Name: LMA Insertion Date/Time: 08/04/2011 10:54 AM Performed by: Christiana Fuchs Pre-anesthesia Checklist: Patient identified, Emergency Drugs available, Suction available and Patient being monitored Patient Re-evaluated:Patient Re-evaluated prior to inductionOxygen Delivery Method: Circle System Utilized Preoxygenation: Pre-oxygenation with 100% oxygen Intubation Type: IV induction Ventilation: Mask ventilation without difficulty LMA: LMA inserted LMA Size: 4.0 Number of attempts: 1 Airway Equipment and Method: bite block Placement Confirmation: positive ETCO2 Tube secured with: Tape Dental Injury: Teeth and Oropharynx as per pre-operative assessment

## 2011-08-04 NOTE — H&P (Signed)
ief Complaint     Bladder cancer  cc: Dr. Galen Daft   Reason For Visit  3 mo cystoscopy   Active Problems Problems  1. Bladder Cancer 188.9 2. Gross Hematuria 599.71 3. Neoplasm Of The Bladder 239.4  History of Present Illness             78 yo married male returns today for a 3 mo cystoscopy for hx of bladder cancer.  He is s/p cysto/TURBT/Mitocycin C instillation on 04/13/11.  He is s/p full strength BCG/Inttron A x 6 weeks on 02/23/11.  He is s/p cystoscopy/TURBT on 12/22/10, with path showing high grade non-invasive TCC.  He was recently in the ER on 07/07/11 for diverticulitis, currently on Levaquin Rx. .       Originally referred by Dr. Rosario Jacks for gross hematuria evaluation.  He has dysuria, frequency & nocturia.  50 pki/yr hx tobacco. None x 10 yrs.   12/15/10  NMP22 - positive     Patient has had a CT scan on 12/16/10:  1.  2.9 x 2.7cm lesion in the Lt posterior bladder wall                                                                2. Bilateral renal stones w/o obstruction. (102mmR and 22mm L)                                                               3. Abnormal proximal colon ( ? segmental diverticulitis)                                                               4. AA Ca++ with ectasia   Past Medical History Problems  1. History of  Arthritis V13.4 2. History of  Atrial Fibrillation 427.31 3. History of  Diverticulitis Of Colon 562.11 4. History of  Glaucoma 365.9 5. History of  Hypercholesterolemia 272.0 6. History of  Murmurs 785.2 7. History of  Nephrolithiasis V13.01  Surgical History Problems  1. History of  Appendectomy 2. History of  Bladder Injection Of Cancer Treatment 3. History of  Cystoscopy With Biopsy 4. History of  Cystoscopy With Fulguration Medium Lesion (2-5cm) 5. History of  Previous Stent Placement  Current Meds 1. Cozaar 25 MG Oral Tablet; Therapy: (Recorded:13Dec2012) to 2. Dexilant CPDR; Therapy: (Recorded:13Dec2012) to 3.  Hydrochlorothiazide 25 MG Oral Tablet; Therapy: (Recorded:13Dec2012) to 4. Isosorbide Mononitrate ER 30 MG Oral Tablet Extended Release 24 Hour; Therapy: [id number] to 5. Latanoprost 0.005 % Ophthalmic Solution; Therapy: [id number] to 6. Levofloxacin 500 MG Oral Tablet; Therapy: [id number] to 7. Losartan Potassium 50 MG Oral Tablet; Therapy: [id number] to 8. Lovastatin TABS; Therapy: (Recorded:13Dec2012) to 9. MetroNIDAZOLE 500 MG Oral Tablet; Therapy: [id number] to 10. Ranexa 500 MG Oral Tablet Extended Release 12 Hour; Therapy: [id number] to 11. Singulair TABS; as needed; Therapy: (Recorded:04Jan2013)  to 12. Timolol Maleate 0.5 % Ophthalmic Solution; Therapy: [id number] to  Allergies Medication  1. Penicillins  Family History Problems  1. Maternal history of  Acute Myocardial Infarction V17.3 2. Paternal history of  Acute Myocardial Infarction V17.3 3. Family history of  Family Health Status Number Of Children 1 son 4. Family history of  Father Deceased At Age 81 heart attack 5. Family history of  Mother Deceased At Age 31 heart attack  Social History Problems  1. Former Smoker V15.82 1 ppd x 28yrs, quit 84yrs ago 2. Marital History - Currently Married 3. Occupation: Retired Denied  4. History of  Alcohol Use 5. History of  Caffeine Use  Review of Systems Genitourinary, constitutional, skin, eye, otolaryngeal, hematologic/lymphatic, cardiovascular, pulmonary, endocrine, musculoskeletal, gastrointestinal, neurological and psychiatric system(s) were reviewed and pertinent findings if present are noted.  Gastrointestinal: abdominal pain.    Physical Exam Constitutional: Well nourished and well developed . No acute distress.  ENT:. The ears and nose are normal in appearance. Hearing loss is noted.  Neck: The appearance of the neck is normal.  Pulmonary: No respiratory distress.  Abdomen: The abdomen is mildly obese, but not distended. The abdomen is soft and nontender. No masses  are palpated. No CVA tenderness. Bowel sounds are normal. No hernias are palpable. No hepatosplenomegaly noted.  Rectal: Rectal exam demonstrates normal sphincter tone, no tenderness and no masses. Normal rectal tone, no rectal masses, prostate is smooth, symmetric and non-tender. The prostate has no nodularity and is not tender. The left seminal vesicle is nonpalpable. The right seminal vesicle is nonpalpable. The perineum is normal on inspection.  Genitourinary: Examination of the penis demonstrates no discharge, no masses, no lesions and a normal meatus. The scrotum is without lesions. The right epididymis is palpably normal and non-tender. The left epididymis is palpably normal and non-tender. The right testis is non-tender and without masses. The left testis is non-tender and without masses.  Neuro/Psych:. Mood and affect are appropriate.    Results/Data Urine [Data Includes: Last 1 Day]   [id number]  COLOR BROWN   APPEARANCE CLOUDY   SPECIFIC GRAVITY 1.020   pH 5.5   GLUCOSE NEG mg/dL  BILIRUBIN SMALL   KETONE NEG mg/dL  BLOOD LARGE   PROTEIN 100 mg/dL  UROBILINOGEN 1 mg/dL  NITRITE POS   LEUKOCYTE ESTERASE TRACE   SQUAMOUS EPITHELIAL/HPF FEW   WBC NONE SEEN WBC/hpf  RBC TNTC RBC/hpf  BACTERIA FEW   CRYSTALS NONE SEEN   CASTS NONE SEEN    Procedure  Procedure: Cystoscopy  Chaperone Present: Brandy.  Indication: Hematuria. History of Urothelial Carcinoma.  Informed Consent: Risks, benefits, and potential adverse events were discussed and informed consent was obtained from the patient.  Prep: The patient was prepped with betadine.  Anesthesia:. Local anesthesia was administered intraurethrally with 2% lidocaine jelly.  Antibiotic prophylaxis: Levofloxacin.  Procedure Note:  Anterior urethra: No abnormalities.  Prostatic urethra: No abnormalities . The lateral prostatic lobes were enlarged. An enlarged intravesical median lobe was visualized.  Bladder: Visulization was clear.  The ureteral orifices were in the normal anatomic position bilaterally. Examination of the bladder demonstrated no clot within the bladder, no trabeculation and no diverticulum no erythematous mucosa, no edema and no cellules. A papillary tumor was seen in the bladder measuring approximately 1 cm in size. This tumor was located toward the midline, on the posterior aspect, at the neck of the bladder. Another papillary tumor was seen in the bladder measuring approximately 1 cm in size.  This tumor was located toward the midline, on the posterior aspect, at the base, on the posterior aspect of the bladder. An additional papillary tumor was seen in the bladder measuring approximately 1 cm in size. This tumor was located toward the midline, on the posterior aspect, at the base of the bladder. Another papillary tumor was seen in the bladder. This tumor was located toward the midline, at the base of the bladder.    Assessment Assessed  1. Bladder Cancer 188.9 2. 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 He will need TURBT. He may need TURP of median lobe to remove the TCC. He desires outpatient surgery.   Plan Health Maintenance (V70.0)  1. UA With REFLEX  Done: [id number] 01:53PM    NESC for TURBT, with GYRUS instrument as OP.   Signatures Electronically signed by : [name], M.D.; Jul 24 2011  3:12PM

## 2011-08-04 NOTE — Op Note (Signed)
Pre-operative diagnosis : Recurrent bladder cancer  Postoperative diagnosis: Same  Operation: Cystourethroscopy, and transurethral resection of multiple large bladder cancers (greater than 15 separate tumors), measuring 2 x 2 cm 2 1 x 1 cm, across the bladder dome  Surgeon:  S. Gaynelle Arabian, MD  First assistant: None  Anesthesia:  general  Preparation: After appropriate preanesthesia, the patient was brought to the operating room, placed on the operating table in the dorsal supine position where general LMA anesthesia was introduced. He was then replaced in the dorsal lithotomy position with pubis was prepped with Betadine solution and draped in usual fashion. The arm band was rechecked.  Review history:78 yo married male returns today for a 3 mo cystoscopy for hx of bladder cancer. He is s/p cysto/TURBT/Mitocycin C instillation on 04/13/11. He is s/p full strength BCG/Inttron A x 6 weeks on 02/23/11. He is s/p cystoscopy/TURBT on 12/22/10, with path showing high grade non-invasive TCC. He was recently in the ER on 07/07/11 for diverticulitis, currently on Levaquin Rx. .  Originally referred by Dr. Rosario Jacks for gross hematuria evaluation. He has dysuria, frequency & nocturia. 50 pki/yr hx tobacco. None x 10 yrs.  12/15/10 NMP22 - positive  Patient has had a CT scan on 12/16/10: 1. 2.9 x 2.7cm lesion in the Lt posterior bladder wall.CT showed multiple bladder tumors across the bladder dome.   Statement of  Likelihood of Success: Excellent. TIME-OUT observed.:  Procedure: Cystourethroscopy was accomplished, which shows a small nonobstructing prostate with bilobar BPH. There was no large median lobe. The trigone was normal, and clear reflux was noted from ureteral orifices, which were located normally on the trigone. There was trabeculation within the bladder, but no cellules were noted. There was no bladder stone noted.  Inspection of the bladder revealed a massive volume of independent bladder tumors  across the bladder dome, from 3:00 to 9:00 positions. Using manipulation of pressing on the abdominal wall, as well as decreasing the volume within the bladder, was able to decompress the dome of the bladder so that I could resect multiple bladder tumors (greater than 15), of multiple sizes, from 2 x 2 centimeters, 2 1 x 1 cm. Tissue was sent to laboratory negative for examination. Bleeding was electrocoagulated.  I elected to place a 24 [place] Ainsworth catheter, in case the patient developed bleeding over the weekend. It will be removed on Monday. The patient received 1 g of IV Tylenol preop. He was awakened, and taken to recovery room in good condition.

## 2011-08-07 ENCOUNTER — Encounter (HOSPITAL_BASED_OUTPATIENT_CLINIC_OR_DEPARTMENT_OTHER): Payer: Self-pay | Admitting: Urology

## 2013-02-13 ENCOUNTER — Encounter: Payer: Self-pay | Admitting: Cardiovascular Disease

## 2013-02-13 ENCOUNTER — Ambulatory Visit (INDEPENDENT_AMBULATORY_CARE_PROVIDER_SITE_OTHER): Payer: Medicare Other | Admitting: Cardiovascular Disease

## 2013-02-13 VITALS — BP 152/87 | HR 81 | Ht 69.0 in | Wt 198.0 lb

## 2013-02-13 DIAGNOSIS — I10 Essential (primary) hypertension: Secondary | ICD-10-CM

## 2013-02-13 DIAGNOSIS — R0602 Shortness of breath: Secondary | ICD-10-CM

## 2013-02-13 DIAGNOSIS — I2510 Atherosclerotic heart disease of native coronary artery without angina pectoris: Secondary | ICD-10-CM

## 2013-02-13 DIAGNOSIS — I447 Left bundle-branch block, unspecified: Secondary | ICD-10-CM

## 2013-02-13 NOTE — Patient Instructions (Signed)
We will call you when we receive your records.

## 2013-02-14 ENCOUNTER — Encounter: Payer: Self-pay | Admitting: Cardiovascular Disease

## 2013-02-14 ENCOUNTER — Telehealth: Payer: Self-pay | Admitting: *Deleted

## 2013-02-14 DIAGNOSIS — I447 Left bundle-branch block, unspecified: Secondary | ICD-10-CM | POA: Insufficient documentation

## 2013-02-14 DIAGNOSIS — E785 Hyperlipidemia, unspecified: Secondary | ICD-10-CM | POA: Insufficient documentation

## 2013-02-14 DIAGNOSIS — I10 Essential (primary) hypertension: Secondary | ICD-10-CM | POA: Insufficient documentation

## 2013-02-14 DIAGNOSIS — I2510 Atherosclerotic heart disease of native coronary artery without angina pectoris: Secondary | ICD-10-CM | POA: Insufficient documentation

## 2013-02-14 HISTORY — DX: Left bundle-branch block, unspecified: I44.7

## 2013-02-14 NOTE — Telephone Encounter (Signed)
Message copied by Tracie Harrier on Fri Feb 14, 2013  2:16 PM ------      Message from: Kathlyn [place] A      Created: Fri Feb 14, 2013  1:01 PM       Inform him that I reviewed his records. Cardiac cath is not needed. I want him to follow up in 3 months to make sure his heart rate does not slow down to the point of needing a pacemaker.  ------

## 2013-02-14 NOTE — Progress Notes (Signed)
Primary care physician: Dr. Rosario Jacks.   HPI  This is an 80 year old male who was referred for evaluation of abnormal EKG during stress test. He has known history of coronary artery disease with previous 2 stent placement at [facility] [address] in 2006. No cardiac events since then. He has chronic medical conditions that include hypertension and hyperlipidemia. He complains of mild exertional dyspnea without chest pain. No orthopnea or PND. He exercises daily on a treadmill for up to 15-30 minutes. He underwent a treadmill nuclear stress test today with Dr. Rosario Jacks. He was in sinus rhythm on baseline EKG with a heart rate of 61 beats per minute. He was then noted to have left bundle branch block when his heart rate reached 75 beats per minute. The patient denies dizziness, syncope or presyncope. Perfusion imaging showed an anteroseptal defect which could be due to left bundle branch block. Ejection fraction was normal.  Allergies  Allergen Reactions  . Penicillins      Current Outpatient Prescriptions on File Prior to Visit  Medication Sig Dispense Refill  . albuterol (PROVENTIL) (2.5 MG/3ML) 0.083% nebulizer solution Take 2.5 mg by nebulization 3 (three) times daily.      . cetirizine (ZYRTEC) 10 MG tablet Take 10 mg by mouth daily. ALLERGIES       . dexlansoprazole (DEXILANT) 60 MG capsule Take 60 mg by mouth every morning.       . fish oil-omega-3 fatty acids 1000 MG capsule Take 1,200 mg by mouth 2 (two) times daily.       . hydrochlorothiazide (HYDRODIURIL) 25 MG tablet Take 25 mg by mouth every morning.       . isosorbide mononitrate (IMDUR) 30 MG 24 hr tablet Take 30 mg by mouth at bedtime.       Marland Kitchen latanoprost (XALATAN) 0.005 % ophthalmic solution Place 1 drop into both eyes at bedtime.       Marland Kitchen losartan (COZAAR) 50 MG tablet Take 50 mg by mouth every morning.       . lovastatin (MEVACOR) 20 MG tablet Take 20 mg by mouth at bedtime.       . Multiple Vitamins-Minerals (MULTIVITAMINS THER. W/MINERALS) TABS  Take 1 tablet by mouth daily.       . ranolazine (RANEXA) 500 MG 12 hr tablet Take 500 mg by mouth 2 (two) times daily.       Current Facility-Administered Medications on File Prior to Visit  Medication Dose Route Frequency Provider Last Rate Last Dose  . mitomycin (MUTAMYCIN) chemo injection 40 mg  40 mg Bladder Instillation Once [name], MD      . mitomycin [facility]) chemo injection 40 mg  40 mg Bladder Instillation Once [name], MD         Past Medical History  Diagnosis Date  . GERD (gastroesophageal reflux disease)   . H/O hiatal hernia   . Arthritis     hands  . BPH (benign prostatic hypertrophy)   . History of atrial fibrillation PT STATES NO PROBLEM SINCE 2006  AFTER CARDIAC STENTING  . S/P primary angioplasty with coronary stent 2006  . Glaucoma of both eyes   . Bronchitis DX 07-07-2011    ON SECOND OF ANTIBIOTICS  AND NEBULIZER TID  . Productive cough   . Short of breath on exertion   . Transitional cell carcinoma of bladder FOLLOWED BY DR Gaynelle Arabian    BLADDER INSTILLATION TX'S  . Coronary artery disease FOLLOWED BY PCP DR  Tiburcio Bash  Lorina Rabon)  S/P PTCA STENT X2 2006--  PT DENIES CARDIAC SYMPTOMS  . Hyperlipidemia   . Hypertension      Past Surgical History  Procedure Laterality Date  . Cystoscopy with biopsy  04/13/2011  RECURRENT TCC OF BLADDER    Procedure: CYSTOSCOPY WITH BIOPSY;  Surgeon: [name], MD;  Location: [facility];  Service: Urology;  Laterality: N/A;  Cold Cup Biopsy  . Transurethral resection of bladder tumor  12-22-2010    BLADDER CANCER  . Appendectomy  02/1988  . Cataract extraction w/ intraocular lens  implant, bilateral  2011  . Transurethral resection of bladder tumor  08/04/2011    Procedure: TRANSURETHRAL RESECTION OF BLADDER TUMOR (TURBT);  Surgeon: [name], MD;  Location: [facility];  Service: Urology;  Laterality: N/A;  . Coronary angioplasty with stent placement  2006     AT [facility]---  X2 STENTS     Family History  Problem Relation Age of Onset  . Heart attack Mother   . Heart attack Father      History   Social History  . Marital Status: Married    Spouse Name: N/A    Number of Children: N/A  . Years of Education: N/A   Occupational History  . Not on file.   Social History Main Topics  . Smoking status: Former Smoker -- 1.00 packs/day for 50 years    Types: Cigarettes    Quit date: 11/02/2000  . Smokeless tobacco: Never Used  . Alcohol Use: No  . Drug Use: No  . Sexual Activity: Not on file   Other Topics Concern  . Not on file   Social History Narrative  . No narrative on file     ROS A 10 point review of system was performed. It is negative other than that mentioned in the history of present illness.   PHYSICAL EXAM   BP 152/87  Pulse 81  Ht 5\' 9"  (1.753 m)  Wt 198 lb (89.812 kg)  BMI 29.23 kg/m2 Constitutional: He is oriented to person, place, and time. He appears well-developed and well-nourished. No distress.  HENT: No nasal discharge.  Head: Normocephalic and atraumatic.  Eyes: Pupils are equal and round.  No discharge. Neck: Normal range of motion. Neck supple. No JVD present. No thyromegaly present.  Cardiovascular: Normal rate, regular rhythm, normal heart sounds. Exam reveals no gallop and no friction rub. No murmur heard.  Pulmonary/Chest: Effort normal and breath sounds normal. No stridor. No respiratory distress. He has no wheezes. He has no rales. He exhibits no tenderness.  Abdominal: Soft. Bowel sounds are normal. He exhibits no distension. There is no tenderness. There is no rebound and no guarding.  Musculoskeletal: Normal range of motion. He exhibits no edema and no tenderness.  Neurological: He is alert and oriented to person, place, and time. Coordination normal.  Skin: Skin is warm and dry. No rash noted. He is not diaphoretic. No erythema. No pallor.  Psychiatric: He has a normal mood and affect. His  behavior is normal. Judgment and thought content normal.       EKG: Normal sinus rhythm with first degree AV block and left bundle branch block. Heart rate is 81 beats per minute   ASSESSMENT AND PLAN

## 2013-02-14 NOTE — Assessment & Plan Note (Signed)
Blood pressure is reasonably controlled on current medications. 

## 2013-02-14 NOTE — Assessment & Plan Note (Addendum)
He did have coronary CTA done at Dr. Laurelyn Sickle office last year which showed patent RCA stent with calcified nonobstructive LAD disease. Also he does not seem to be having angina. Thus, I recommend continued medical therapy. The patient is having hard time affording Ranexa. This medication can be discontinued if needed and reevaluate his symptoms.

## 2013-02-14 NOTE — Assessment & Plan Note (Signed)
The patient seems to have a rate dependent left bundle branch block with first degree AV block. He is overall asymptomatic and denies any dizziness, syncope or presyncope. Thus, there is no indication for a pacemaker placement. However, he might progress in the future to high-grade AV block requiring pacemaker and should be monitored closely.

## 2013-02-14 NOTE — Telephone Encounter (Signed)
Scheduled 3 month appt

## 2013-02-24 ENCOUNTER — Ambulatory Visit: Payer: Medicare Other | Admitting: Cardiovascular Disease

## 2013-05-15 ENCOUNTER — Ambulatory Visit (INDEPENDENT_AMBULATORY_CARE_PROVIDER_SITE_OTHER): Payer: Medicare Other | Admitting: Cardiovascular Disease

## 2013-05-15 ENCOUNTER — Encounter: Payer: Self-pay | Admitting: Cardiovascular Disease

## 2013-05-15 VITALS — BP 148/77 | HR 61 | Ht 69.0 in | Wt 195.5 lb

## 2013-05-15 DIAGNOSIS — I10 Essential (primary) hypertension: Secondary | ICD-10-CM

## 2013-05-15 DIAGNOSIS — I447 Left bundle-branch block, unspecified: Secondary | ICD-10-CM

## 2013-05-15 DIAGNOSIS — E785 Hyperlipidemia, unspecified: Secondary | ICD-10-CM

## 2013-05-15 DIAGNOSIS — I2510 Atherosclerotic heart disease of native coronary artery without angina pectoris: Secondary | ICD-10-CM

## 2013-05-15 NOTE — Assessment & Plan Note (Signed)
Continue treatment with atorvastatin with a target LDL of less than 70. 

## 2013-05-15 NOTE — Progress Notes (Signed)
Primary care physician: Dr. Rosario Jacks.   HPI  This is an 80 year old male who is in today for a followup visit.  He has known history of coronary artery disease with  2 stent placement to the RCA at [facility] in 2006. No cardiac events since then. He has chronic medical conditions that include hypertension and hyperlipidemia.  Treadmill nuclear stress test in January of this year showed no acute evidence of ischemia with normal ejection fraction. He developed  rate dependent left bundle branch block.  He had a coronary CTA done in 2014 (Dr. Laurelyn Sickle office) which showed patent RCA stents with mild nonobstructive LAD disease according to the report. The patient has been doing reasonably well and denies chest pain or significant dyspnea. No dizziness, syncope or presyncope.  Allergies  Allergen Reactions  . Penicillins      Current Outpatient Prescriptions on File Prior to Visit  Medication Sig Dispense Refill  . albuterol (PROVENTIL) (2.5 MG/3ML) 0.083% nebulizer solution Take 2.5 mg by nebulization 3 (three) times daily.      Marland Kitchen aspirin 81 MG tablet Take 81 mg by mouth daily.      . cetirizine (ZYRTEC) 10 MG tablet Take 10 mg by mouth daily. ALLERGIES       . dexlansoprazole (DEXILANT) 60 MG capsule Take 60 mg by mouth every morning.       . fish oil-omega-3 fatty acids 1000 MG capsule Take 1,200 mg by mouth 2 (two) times daily.       . hydrochlorothiazide (HYDRODIURIL) 25 MG tablet Take 25 mg by mouth every morning.       . isosorbide mononitrate (IMDUR) 30 MG 24 hr tablet Take 30 mg by mouth at bedtime.       Marland Kitchen latanoprost (XALATAN) 0.005 % ophthalmic solution Place 1 drop into both eyes at bedtime.       Marland Kitchen losartan (COZAAR) 50 MG tablet Take 50 mg by mouth every morning.       . lovastatin (MEVACOR) 20 MG tablet Take 20 mg by mouth at bedtime.       . Multiple Vitamins-Minerals (MULTIVITAMINS THER. W/MINERALS) TABS Take 1 tablet by mouth daily.       . ranolazine (RANEXA) 500 MG 12 hr tablet  Take 500 mg by mouth 2 (two) times daily.       Current Facility-Administered Medications on File Prior to Visit  Medication Dose Route Frequency Provider Last Rate Last Dose  . mitomycin (MUTAMYCIN) chemo injection 40 mg  40 mg Bladder Instillation Once [name], MD      . mitomycin [facility]) chemo injection 40 mg  40 mg Bladder Instillation Once [name], MD         Past Medical History  Diagnosis Date  . GERD (gastroesophageal reflux disease)   . H/O hiatal hernia   . Arthritis     hands  . BPH (benign prostatic hypertrophy)   . History of atrial fibrillation PT STATES NO PROBLEM SINCE 2006  AFTER CARDIAC STENTING  . S/P primary angioplasty with coronary stent 2006  . Glaucoma of both eyes   . Bronchitis DX 07-07-2011    ON SECOND OF ANTIBIOTICS  AND NEBULIZER TID  . Productive cough   . Short of breath on exertion   . Transitional cell carcinoma of bladder FOLLOWED BY DR Gaynelle Arabian    BLADDER INSTILLATION TX'S  . Coronary artery disease FOLLOWED BY PCP DR  Tiburcio Bash  Lorina Rabon)    S/P PTCA STENT X2 2006--  PT DENIES CARDIAC SYMPTOMS  . Hyperlipidemia   . Hypertension      Past Surgical History  Procedure Laterality Date  . Cystoscopy with biopsy  04/13/2011  RECURRENT TCC OF BLADDER    Procedure: CYSTOSCOPY WITH BIOPSY;  Surgeon: [name], MD;  Location: [facility];  Service: Urology;  Laterality: N/A;  Cold Cup Biopsy  . Transurethral resection of bladder tumor  12-22-2010    BLADDER CANCER  . Appendectomy  02/1988  . Cataract extraction w/ intraocular lens  implant, bilateral  2011  . Transurethral resection of bladder tumor  08/04/2011    Procedure: TRANSURETHRAL RESECTION OF BLADDER TUMOR (TURBT);  Surgeon: [name], MD;  Location: [facility];  Service: Urology;  Laterality: N/A;  . Coronary angioplasty with stent placement  2006    AT [facility]---  X2 STENTS     Family History  Problem Relation Age of Onset  .  Heart attack Mother   . Heart attack Father      History   Social History  . Marital Status: Married    Spouse Name: N/A    Number of Children: N/A  . Years of Education: N/A   Occupational History  . Not on file.   Social History Main Topics  . Smoking status: Former Smoker -- 1.00 packs/day for 50 years    Types: Cigarettes    Quit date: 11/02/2000  . Smokeless tobacco: Never Used  . Alcohol Use: No  . Drug Use: No  . Sexual Activity: Not on file   Other Topics Concern  . Not on file   Social History Narrative  . No narrative on file     ROS A 10 point review of system was performed. It is negative other than that mentioned in the history of present illness.   PHYSICAL EXAM   BP 148/77  Pulse 61  Ht 5\' 9"  (1.753 m)  Wt 195 lb 8 oz (88.678 kg)  BMI 28.86 kg/m2 Constitutional: He is oriented to person, place, and time. He appears well-developed and well-nourished. No distress.  HENT: No nasal discharge.  Head: Normocephalic and atraumatic.  Eyes: Pupils are equal and round.  No discharge. Neck: Normal range of motion. Neck supple. No JVD present. No thyromegaly present.  Cardiovascular: Normal rate, regular rhythm, normal heart sounds. Exam reveals no gallop and no friction rub. No murmur heard.  Pulmonary/Chest: Effort normal and breath sounds normal. No stridor. No respiratory distress. He has no wheezes. He has no rales. He exhibits no tenderness.  Abdominal: Soft. Bowel sounds are normal. He exhibits no distension. There is no tenderness. There is no rebound and no guarding.  Musculoskeletal: Normal range of motion. He exhibits no edema and no tenderness.  Neurological: He is alert and oriented to person, place, and time. Coordination normal.  Skin: Skin is warm and dry. No rash noted. He is not diaphoretic. No erythema. No pallor.  Psychiatric: He has a normal mood and affect. His behavior is normal. Judgment and thought content normal.       EKG:  Sinus  Rhythm  -First degree A-V block  -with ectopic ventricular couplets  PRi = 234 -Left bundle branch block and right axis.   ABNORMAL    ASSESSMENT AND PLAN

## 2013-05-15 NOTE — Assessment & Plan Note (Signed)
He is doing well with no symptoms suggestive of angina. Continue medical therapy. 

## 2013-05-15 NOTE — Patient Instructions (Signed)
Continue same medications.   Your physician wants you to follow-up in: 6 months.  You will receive a reminder letter in the mail two months in advance. If you don't receive a letter, please call our office to schedule the follow-up appointment.  

## 2013-05-15 NOTE — Assessment & Plan Note (Signed)
Blood pressure is well controlled on medications.

## 2013-05-15 NOTE — Assessment & Plan Note (Signed)
He has a left bundle branch block and first degree AV block. He has no symptoms of dizziness, presyncope or syncope. Continue to monitor.

## 2013-05-27 ENCOUNTER — Ambulatory Visit: Payer: Self-pay | Admitting: Gastroenterology

## 2013-06-04 ENCOUNTER — Other Ambulatory Visit: Payer: Self-pay | Admitting: Urology

## 2013-06-11 ENCOUNTER — Encounter (HOSPITAL_BASED_OUTPATIENT_CLINIC_OR_DEPARTMENT_OTHER): Payer: Self-pay | Admitting: *Deleted

## 2013-06-12 ENCOUNTER — Encounter (HOSPITAL_BASED_OUTPATIENT_CLINIC_OR_DEPARTMENT_OTHER): Payer: Self-pay | Admitting: *Deleted

## 2013-06-12 NOTE — Progress Notes (Signed)
06/12/13 1456  OBSTRUCTIVE SLEEP APNEA  Have you ever been diagnosed with sleep apnea through a sleep study? No  Do you snore loudly (loud enough to be heard through closed doors)?  0  Do you often feel tired, fatigued, or sleepy during the daytime? 0  Has anyone observed you stop breathing during your sleep? 0  Do you have, or are you being treated for high blood pressure? 1  BMI more than 35 kg/m2? 0  Age over 78 years old? 1  Neck circumference greater than 40 cm/16 inches? 1  Gender: 1  Obstructive Sleep Apnea Score 4  Score 4 or greater  Results sent to PCP

## 2013-06-12 NOTE — Progress Notes (Signed)
NPO AFTER MN. ARRIVE AT 0830. NEED ISTAT. CURRENT EKG IN CHART AND EPIC.  WILL TAKE RANEXA, DEXILANT, AND LOSARTAN AM DOS W/ SIPS OF WATER.

## 2013-06-19 ENCOUNTER — Encounter (HOSPITAL_BASED_OUTPATIENT_CLINIC_OR_DEPARTMENT_OTHER)
Admission: RE | Disposition: A | Discharge status: Home / Self Care | Payer: Self-pay | Source: Ambulatory Visit | Attending: Urology

## 2013-06-19 ENCOUNTER — Encounter (HOSPITAL_BASED_OUTPATIENT_CLINIC_OR_DEPARTMENT_OTHER): Payer: Medicare Other | Admitting: Anesthesiology

## 2013-06-19 ENCOUNTER — Encounter (HOSPITAL_BASED_OUTPATIENT_CLINIC_OR_DEPARTMENT_OTHER): Payer: Self-pay | Admitting: *Deleted

## 2013-06-19 ENCOUNTER — Ambulatory Visit (HOSPITAL_BASED_OUTPATIENT_CLINIC_OR_DEPARTMENT_OTHER): Payer: Medicare Other | Admitting: Anesthesiology

## 2013-06-19 ENCOUNTER — Ambulatory Visit (HOSPITAL_BASED_OUTPATIENT_CLINIC_OR_DEPARTMENT_OTHER)
Admission: RE | Admit: 2013-06-19 | Discharge: 2013-06-19 | Disposition: A | Discharge status: Home / Self Care | Payer: Medicare Other | Source: Ambulatory Visit | Attending: Urology | Admitting: Urology

## 2013-06-19 DIAGNOSIS — I77811 Abdominal aortic ectasia: Secondary | ICD-10-CM | POA: Insufficient documentation

## 2013-06-19 DIAGNOSIS — K219 Gastro-esophageal reflux disease without esophagitis: Secondary | ICD-10-CM | POA: Insufficient documentation

## 2013-06-19 DIAGNOSIS — N200 Calculus of kidney: Secondary | ICD-10-CM | POA: Insufficient documentation

## 2013-06-19 DIAGNOSIS — E7800 Pure hypercholesterolemia, unspecified: Secondary | ICD-10-CM | POA: Insufficient documentation

## 2013-06-19 DIAGNOSIS — H409 Unspecified glaucoma: Secondary | ICD-10-CM | POA: Insufficient documentation

## 2013-06-19 DIAGNOSIS — R933 Abnormal findings on diagnostic imaging of other parts of digestive tract: Secondary | ICD-10-CM | POA: Insufficient documentation

## 2013-06-19 DIAGNOSIS — I10 Essential (primary) hypertension: Secondary | ICD-10-CM | POA: Insufficient documentation

## 2013-06-19 DIAGNOSIS — C679 Malignant neoplasm of bladder, unspecified: Secondary | ICD-10-CM | POA: Insufficient documentation

## 2013-06-19 DIAGNOSIS — C649 Malignant neoplasm of unspecified kidney, except renal pelvis: Secondary | ICD-10-CM | POA: Insufficient documentation

## 2013-06-19 DIAGNOSIS — Z87891 Personal history of nicotine dependence: Secondary | ICD-10-CM | POA: Insufficient documentation

## 2013-06-19 DIAGNOSIS — N289 Disorder of kidney and ureter, unspecified: Secondary | ICD-10-CM | POA: Insufficient documentation

## 2013-06-19 DIAGNOSIS — C659 Malignant neoplasm of unspecified renal pelvis: Secondary | ICD-10-CM

## 2013-06-19 HISTORY — PX: CYSTOSCOPY WITH RETROGRADE PYELOGRAM, URETEROSCOPY AND STENT PLACEMENT: SHX5789

## 2013-06-19 HISTORY — DX: Left bundle-branch block, unspecified: I44.7

## 2013-06-19 HISTORY — DX: Other specified personal risk factors, not elsewhere classified: Z91.89

## 2013-06-19 LAB — POCT I-STAT, CHEM 8
BUN: 24 mg/dL — ABNORMAL HIGH (ref 6–23)
CALCIUM ION: 1.19 mmol/L (ref 1.13–1.30)
CREATININE: 1.3 mg/dL (ref 0.50–1.35)
Chloride: 99 mEq/L (ref 96–112)
Glucose, Bld: 123 mg/dL — ABNORMAL HIGH (ref 70–99)
HCT: 44 % (ref 39.0–52.0)
Hemoglobin: 15 g/dL (ref 13.0–17.0)
Potassium: 4.4 mEq/L (ref 3.7–5.3)
Sodium: 140 mEq/L (ref 137–147)
TCO2: 32 mmol/L (ref 0–100)

## 2013-06-19 SURGERY — CYSTOURETEROSCOPY, WITH RETROGRADE PYELOGRAM AND STENT INSERTION
Anesthesia: General | Site: Ureter | Laterality: Left

## 2013-06-19 MED ORDER — FENTANYL CITRATE 0.05 MG/ML IJ SOLN
25.0000 ug | INTRAMUSCULAR | Status: DC | PRN
  Filled 2013-06-19: qty 1

## 2013-06-19 MED ORDER — KETOROLAC TROMETHAMINE 30 MG/ML IJ SOLN
INTRAMUSCULAR | Status: DC | PRN
  Administered 2013-06-19: 15 mg via INTRAVENOUS

## 2013-06-19 MED ORDER — LIDOCAINE HCL (CARDIAC) 20 MG/ML IV SOLN
INTRAVENOUS | Status: DC | PRN
  Administered 2013-06-19: 60 mg via INTRAVENOUS

## 2013-06-19 MED ORDER — BELLADONNA ALKALOIDS-OPIUM 16.2-60 MG RE SUPP
RECTAL | Status: DC | PRN
  Administered 2013-06-19: 1 via RECTAL

## 2013-06-19 MED ORDER — PROMETHAZINE HCL 25 MG/ML IJ SOLN
6.2500 mg | INTRAMUSCULAR | Status: DC | PRN
  Filled 2013-06-19: qty 1

## 2013-06-19 MED ORDER — FENTANYL CITRATE 0.05 MG/ML IJ SOLN
INTRAMUSCULAR | Status: AC
  Filled 2013-06-19: qty 2

## 2013-06-19 MED ORDER — OXYCODONE-ACETAMINOPHEN 5-325 MG PO TABS: 1.0000 | ORAL_TABLET | ORAL | Status: DC | PRN

## 2013-06-19 MED ORDER — PROPOFOL 10 MG/ML IV BOLUS
INTRAVENOUS | Status: DC | PRN
  Administered 2013-06-19: 160 mg via INTRAVENOUS

## 2013-06-19 MED ORDER — URIBEL 118 MG PO CAPS: 1.0000 | ORAL_CAPSULE | Freq: Two times a day (BID) | ORAL | Status: DC | PRN

## 2013-06-19 MED ORDER — IOHEXOL 350 MG/ML SOLN
INTRAVENOUS | Status: DC | PRN
  Administered 2013-06-19: 30 mL via INTRAVENOUS

## 2013-06-19 MED ORDER — CIPROFLOXACIN IN D5W 400 MG/200ML IV SOLN
400.0000 mg | INTRAVENOUS | Status: AC
  Administered 2013-06-19: 400 mg via INTRAVENOUS
  Filled 2013-06-19: qty 200

## 2013-06-19 MED ORDER — CIPROFLOXACIN HCL 500 MG PO TABS: 500.0000 mg | ORAL_TABLET | Freq: Two times a day (BID) | ORAL | Status: DC

## 2013-06-19 MED ORDER — DEXAMETHASONE SODIUM PHOSPHATE 4 MG/ML IJ SOLN
INTRAMUSCULAR | Status: DC | PRN
  Administered 2013-06-19: 4 mg via INTRAVENOUS

## 2013-06-19 MED ORDER — ONDANSETRON HCL 4 MG/2ML IJ SOLN
INTRAMUSCULAR | Status: DC | PRN
  Administered 2013-06-19: 4 mg via INTRAVENOUS

## 2013-06-19 MED ORDER — BELLADONNA ALKALOIDS-OPIUM 16.2-60 MG RE SUPP
RECTAL | Status: AC
  Filled 2013-06-19: qty 1

## 2013-06-19 MED ORDER — SODIUM CHLORIDE 0.9 % IR SOLN
Status: DC | PRN
  Administered 2013-06-19: 3000 mL

## 2013-06-19 MED ORDER — FENTANYL CITRATE 0.05 MG/ML IJ SOLN
INTRAMUSCULAR | Status: DC | PRN
  Administered 2013-06-19: 25 ug via INTRAVENOUS
  Administered 2013-06-19: 50 ug via INTRAVENOUS

## 2013-06-19 MED ORDER — LACTATED RINGERS IV SOLN
INTRAVENOUS | Status: DC
  Administered 2013-06-19 (×2): via INTRAVENOUS
  Filled 2013-06-19: qty 1000

## 2013-06-19 SURGICAL SUPPLY — 17 items
BAG DRAIN URO-CYSTO SKYTR STRL (DRAIN) ×3 IMPLANT
CANISTER SUCT LVC 12 LTR MEDI- (MISCELLANEOUS) ×3 IMPLANT
CATH INTERMIT  6FR 70CM (CATHETERS) ×3 IMPLANT
CLOTH BEACON ORANGE TIMEOUT ST (SAFETY) ×3 IMPLANT
CONTOUR STENT 6X24 ×3 IMPLANT
DRAPE CAMERA CLOSED 9X96 (DRAPES) ×3 IMPLANT
GLOVE BIO SURGEON STRL SZ7 (GLOVE) ×3 IMPLANT
GLOVE BIOGEL M STER SZ 6 (GLOVE) ×3 IMPLANT
GLOVE INDICATOR 6.5 STRL GRN (GLOVE) ×3 IMPLANT
GOWN STRL REUS W/TWL LRG LVL3 (GOWN DISPOSABLE) ×6 IMPLANT
GUIDEWIRE STR DUAL SENSOR (WIRE) ×6 IMPLANT
IV NS IRRIG 3000ML ARTHROMATIC (IV SOLUTION) ×6 IMPLANT
KIT BALLIN UROMAX 15FX10 (LABEL) ×1 IMPLANT
NS IRRIG 500ML POUR BTL (IV SOLUTION) ×3 IMPLANT
SET HIGH PRES BAL DIL (LABEL) ×2
SHEATH ACCESS URETERAL 38CM (SHEATH) ×3 IMPLANT
STENT URET 6FRX24 CONTOUR (STENTS) ×3 IMPLANT

## 2013-06-19 NOTE — Anesthesia Postprocedure Evaluation (Signed)
  Anesthesia Post-op Note  Patient: Jacob [name]  Procedure(s) Performed: Procedure(s) (LRB): CYSTOSCOPY WITH LEFT RETROGRADE PYELOGRAM, LEFT URETEROSCOPY, ureteral balloon dilatation, BIOPSY LEFT KIDNEY (Left)  Patient Location: PACU  Anesthesia Type: General  Level of Consciousness: awake and alert   Airway and Oxygen Therapy: Patient Spontanous Breathing  Post-op Pain: mild  Post-op Assessment: Post-op Vital signs reviewed, Patient's Cardiovascular Status Stable, Respiratory Function Stable, Patent Airway and No signs of Nausea or vomiting  Last Vitals:  Filed Vitals:   06/19/13 1200  BP: 136/59  Pulse: 59  Temp:   Resp: 20    Post-op Vital Signs: stable   Complications: No apparent anesthesia complications

## 2013-06-19 NOTE — Anesthesia Preprocedure Evaluation (Signed)
Anesthesia Evaluation  Patient identified by MRN, date of birth, ID band Patient awake    Reviewed: Allergy & Precautions, H&P , NPO status , Patient's Chart, lab work & pertinent test results  Airway Mallampati: II TM Distance: >3 FB Neck ROM: Full    Dental no notable dental hx.    Pulmonary neg pulmonary ROS, former smoker,  breath sounds clear to auscultation  Pulmonary exam normal       Cardiovascular hypertension, Pt. on medications + CAD and + Cardiac Stents Rhythm:Regular Rate:Normal     Neuro/Psych negative neurological ROS  negative psych ROS   GI/Hepatic negative GI ROS, Neg liver ROS, GERD-  ,  Endo/Other  negative endocrine ROS  Renal/GU negative Renal ROS  negative genitourinary   Musculoskeletal negative musculoskeletal ROS (+)   Abdominal   Peds negative pediatric ROS (+)  Hematology negative hematology ROS (+)   Anesthesia Other Findings   Reproductive/Obstetrics negative OB ROS                           Anesthesia Physical Anesthesia Plan  ASA: III  Anesthesia Plan: General   Post-op Pain Management:    Induction: Intravenous  Airway Management Planned: LMA  Additional Equipment:   Intra-op Plan:   Post-operative Plan: Extubation in OR  Informed Consent: I have reviewed the patients History and Physical, chart, labs and discussed the procedure including the risks, benefits and alternatives for the proposed anesthesia with the patient or authorized representative who has indicated his/her understanding and acceptance.   Dental advisory given  Plan Discussed with: CRNA and Surgeon  Anesthesia Plan Comments:         Anesthesia Quick Evaluation

## 2013-06-19 NOTE — Interval H&P Note (Signed)
History and Physical Interval Note:  06/19/2013 8:20 AM  Jacob [name]  has presented today for surgery, with the diagnosis of left upper pole renal lesion  The various methods of treatment have been discussed with the patient and family. After consideration of risks, benefits and other options for treatment, the patient has consented to  Procedure(s): CYSTOSCOPY WITH LEFT RETROGRADE PYELOGRAM, LEFT URETEROSCOPY AND POSSIBLE BIOPSY LEFT KIDNEY (Left) as a surgical intervention .  The patient's history has been reviewed, patient examined, no change in status, stable for surgery.  I have reviewed the patient's chart and labs.  Questions were answered to the patient's satisfaction.     [name] I

## 2013-06-19 NOTE — Op Note (Signed)
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  Surgeon:  Chauncey Cruel. Gaynelle Arabian, MD  First assistant: None    Anesthesia:  General LMA   Preparation:  After appropriate preanesthesia, the patient was brought the operative room, placed in the upper table in the dorsal supine position where general LMA anesthesia was induced. The history was reviewed. The CT scan disc from outside hospital was placed in the DVD player, and reviewed during the case. The pubis was prepped with Betadine solution, and draped in usual fashion. The arm band was double checked.   Active Problems  Problems  1. Bladder cancer (188.9)  2. Renal mass ( L)  Assessed By: [name] (Urology); Last Assessed: 03 Mar 2013  History of Present Illness  80 yo married male returns today because of a Lt renal mass recently seen on CT for hx of LLQ abdominal pain. Hx of bladder cancer, s/p a negative cystoscopy on 03/03/13. He is s/p full strength BCG/Intron A x 6 weeks on 10/19/11. He is s/p Gyrus TURBT of multiple (> 15) large tumors on 08/04/11 from the R lateral wall and dome. Path shows low grade, non-invasive tumors.  He is s/p cysto/TURBT/Mitocycin C instillation on 04/13/11. He is s/p full strength BCG/Intron A x 6 weeks on 02/23/11. He is s/p cystoscopy/TURBT on 12/22/10, with  path showing high grade non-invasive TCC. He was recently in the ER on 07/07/11 for diverticulitis, currently on Levaquin Rx. .  Originally referred by Dr. Rosario Jacks for gross hematuria evaluation. He has dysuria, frequency & nocturia. 50 pk/yr hx tobacco. None x 10 yrs.  12/15/10 NMP22 - positive  Patient has had a CT scan on 12/16/10: 1. 2.9 x 2.7cm lesion in the Lt posterior bladder wall  2. Bilateral renal stones w/o obstruction. (46mmR and 92mm L)  3. Abnormal proximal colon ( ? segmental diverticulitis)  4. AA Ca++ with ectasia  Past Medical History  Problems  1. History of Arthritis (V13.4)  2. History of Atrial fibrillation (427.31)  3. History of diverticulitis of colon (V12.79)  4. History of glaucoma (V12.49)  5. History of hypercholesterolemia (V12.29)  6. History of kidney stones (V13.01)  7. History of Murmur (785.2)  Surgical History 4. AA Ca++ with ectasia  Past Medical History  Problems  1. History of Arthritis (V13.4)  2. History of Atrial fibrillation (427.31)  3. History of diverticulitis of colon (V12.79)  4. History of glaucoma (V12.49)  5. History of hypercholesterolemia (V12.29)  6. History of kidney stones (V13.01)  7. History of Murmur (785.2)  Surgical History   Statement of  Likelihood of Success: Excellent. TIME-OUT observed.:  Procedure:  Cystourethroscopy was accomplished, showing normal urethra, and normal prostatic urethra. The patient has a history of bladder cancer, but no recurrence of bladder cancers identified. The left   ureteral orifice was difficult to identify on the  trigone, and therefore, I used a sensor wire through a 6 open-ended  catheter to probe the left ureteral orifice, and was able to identify the left ureteral orifice. The open-ended catheter then easily traversed the intramural ureter. Left retrograde PolyGram was performed, which showed grossly abnormal upper pole and lower pole. This was x-rayed under magnification, with radiographs and  to the chart.    2 guidewires were then placed in the renal pelvis. 1 wire was used as a Chiropodist. A second wire was used as a working wire. The mid length ureteral sheath was passed, but would not go up the ureter. Therefore, a 10 cm balloon dilator was placed over the wire, into the cystoscope, and will the ureter was balloon dilated for 3 minutes and 18 atmospheres pressure. The balloon dilator was removed, and again, the mid length self dilating Cook ureteral sheath was passed without difficulty into the upper ureter. The digital flexible ureteroscope was then passed into the upper ureter, but could not be passed into the renal pelvis. Therefore, the sensor guidewire was passed through the digital scope alongside the previously placed wire, into the renal pelvis, and the digital scope was used to follow the wire into the renal pelvis. The wire was then removed. Digital pyeloscopy was then accomplished. The renal pelvis appeared within normal limits. No stones were identified within pelvis. However, within the upper pole calyx and in the lower pole calyx, gross tumor was identified. The lower pole calyx tumor was brush biopsy, and the upper pole calyx tumor was biopsied with the biopsy forceps. In the mid pole, Randall's plaques were identified. Because of stringy clots identified, I elected to pass a 6 [place] by 24 cm double-J stent, and this was passed over the safety wire, after removing the digital ureteroscope, and replacing the cystoscope over the safety wire. Under fluoroscopic control, it was noted that the double-J stent was in good position in the kidney, and the bladder. The bladder was drained of fluid, and the patient was given IV Toradol, and IV Tylenol. He was awakened, and taken to recovery room in good condition.

## 2013-06-19 NOTE — Transfer of Care (Signed)
Immediate Anesthesia Transfer of Care Note  Patient: KARIM AIELLO  Procedure(s) Performed: Procedure(s) (LRB): CYSTOSCOPY WITH LEFT RETROGRADE PYELOGRAM, LEFT URETEROSCOPY, ureteral balloon dilatation, BIOPSY LEFT KIDNEY (Left)  Patient Location: PACU  Anesthesia Type: General  Level of Consciousness: awake, oriented, sedated and patient cooperative  Airway & Oxygen Therapy: Patient Spontanous Breathing and Patient connected to face mask oxygen  Post-op Assessment: Report given to PACU RN and Post -op Vital signs reviewed and stable  Post vital signs: Reviewed and stable  Complications: No apparent anesthesia complications

## 2013-06-19 NOTE — H&P (Signed)
Reason For Visit Lt Renal mass   Active Problems Problems  1. Bladder cancer (188.9)   Assessed By: [name] (Urology); Last Assessed: 03 Mar 2013  History of Present Illness     80 yo married male returns today because of a Lt renal mass recently seen on CT for hx of LLQ abdominal pain. Hx of bladder cancer, s/p a negative cystoscopy on 03/03/13. He is s/p full strength BCG/Intron A x 6 weeks on 10/19/11. He is s/p Gyrus TURBT of multiple (> 15) large tumors on 08/04/11 from the R lateral wall and dome. Path shows low grade, non-invasive tumors.       He is s/p cysto/TURBT/Mitocycin C instillation on 04/13/11. He is s/p full strength BCG/Intron A x 6 weeks on 02/23/11. He is s/p cystoscopy/TURBT on 12/22/10, with path showing high grade non-invasive TCC. He was recently in the ER on 07/07/11 for diverticulitis, currently on Levaquin Rx. .        Originally referred by Dr. Rosario Jacks for gross hematuria evaluation. He has dysuria, frequency & nocturia. 50 pk/yr hx tobacco. None x 10 yrs.     12/15/10 NMP22 - positive      Patient has had a CT scan on 12/16/10: 1. 2.9 x 2.7cm lesion in the Lt posterior bladder wall                                  2. Bilateral renal stones w/o obstruction. (58mmR and 83mm L)                                 3. Abnormal proximal colon ( ? segmental diverticulitis)                                 4. AA Ca++ with ectasia   Past Medical History Problems  1. History of Arthritis (V13.4) 2. History of Atrial fibrillation (427.31) 3. History of diverticulitis of colon (V12.79) 4. History of glaucoma (V12.49) 5. History of hypercholesterolemia (V12.29) 6. History of kidney stones (V13.01) 7. History of Murmur (785.2)  Surgical History Problems  1. History of Appendectomy 2. History of Bladder Injection Of Cancer Treatment 3. History of Cystoscopy With Biopsy 4. History of Cystoscopy With Fulguration Medium Lesion (2-5cm) 5. History of  Cystoscopy With Fulguration Medium Lesion (2-5cm) 6. History of Previous Stent Placement  Current Meds 1. Aspirin 81 MG Oral Tablet;  Therapy: (Recorded:20Nov2013) to Recorded 2. Cozaar 25 MG Oral Tablet;  Therapy: (Recorded:13Dec2012) to Recorded 3. Crestor 20 MG Oral Tablet;  Therapy: [id number] to Recorded 4. Fish Oil CAPS;  Therapy: (Recorded:20Nov2013) to Recorded 5. Hydrochlorothiazide 25 MG Oral Tablet;  Therapy: (Recorded:13Dec2012) to Recorded 6. Isosorbide Mononitrate ER 30 MG Oral Tablet Extended Release 24 Hour;  Therapy: [id number] to Recorded 7. Lovastatin TABS;  Therapy: (Recorded:13Dec2012) to Recorded 8. PriLOSEC PACK;  Therapy: (Recorded:20Nov2013) to Recorded 9. Ranexa 500 MG Oral Tablet Extended Release 12 Hour;  Therapy: [id number] to Recorded  Allergies Medication  1. Penicillins  Family History Problems  1. Family history of Acute Myocardial Infarction (V17.3) : Mother 2. Family history of Acute Myocardial Infarction (V17.3) : Father 3. Family history of Family Health Status Number Of Children   1 son 4. Family history of Father Deceased At Age 81  heart attack 5. Family history of Mother Deceased At Age 17   heart attack  Social History Problems  1. Denied: History of Alcohol Use 2. Denied: History of Caffeine Use 3. Former smoker (V15.82)   1 ppd x 77yrs, quit 29yrs ago 4. Marital History - Currently Married 5. Occupation: Retired  Electronics engineer, constitutional, skin, eye, otolaryngeal, hematologic/lymphatic, cardiovascular, pulmonary, endocrine, musculoskeletal, gastrointestinal, neurological and psychiatric system(s) were reviewed and pertinent findings if present are noted.    Vitals Vital Signs [Data Includes: Last 1 Day]  Recorded: 02Jun2015 04:08PM  Blood Pressure: 146 / 66 Temperature: 97.2 F Heart Rate: 74  Results/Data Urine [Data Includes: Last 1 Day]   [id number]  COLOR AMBER   APPEARANCE CLEAR    SPECIFIC GRAVITY 1.020   pH 6.0   GLUCOSE NEG mg/dL  BILIRUBIN NEG   KETONE NEG mg/dL  BLOOD SMALL   PROTEIN NEG mg/dL  UROBILINOGEN 0.2 mg/dL  NITRITE NEG   LEUKOCYTE ESTERASE NEG   SQUAMOUS EPITHELIAL/HPF RARE   WBC 0-2 WBC/hpf  RBC 21-50 RBC/hpf  BACTERIA FEW   CRYSTALS NONE SEEN   CASTS NONE SEEN    Outside [facility] from [facility] is reviewed with the patient. The abnormal Left upper pole collecting system is identified. Definite diagnosis is not made, however.   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 We have discusssed options, and I have advised Mr Fellman to have L retrograde pyelogram and digital ureteroscopy and possible biopsy of LUP lesion. He may need MRI of the kidneys in the future.   Plan Health Maintenance  1. UA With REFLEX; [Do Not Release]; Status:Resulted - Requires  Verification;   Done: [id number] 03:47PM  cystoscopy and LRP and Left ureteroscopy and possible biopsy of LUP lesion.   Discussion/Summary cc: Dr. Rosario Jacks     Signatures Electronically signed by : [name], M.D.; Jun 03 2013  4:51PM EST

## 2013-06-19 NOTE — Discharge Instructions (Signed)
Post Anesthesia Home Care Instructions  Activity: Get plenty of rest for the remainder of the day. A responsible adult should stay with you for 24 hours following the procedure.  For the next 24 hours, DO NOT: -Drive a car -Paediatric nurse -Drink alcoholic beverages -Take any medication unless instructed by your physician -Make any legal decisions or sign important papers.  Meals: Start with liquid foods such as gelatin or soup. Progress to regular foods as tolerated. Avoid greasy, spicy, heavy foods. If nausea and/or vomiting occur, drink only clear liquids until the nausea and/or vomiting subsides. Call your physician if vomiting continues.  Special Instructions/Symptoms: Your throat may feel dry or sore from the anesthesia or the breathing tube placed in your throat during surgery. If this causes discomfort, gargle with warm salt water. The discomfort should disappear within 24 hours.   [facility] [phone] Post Ureteroscopy With or Without Stent Instructions  Definitions:  Ureter: The duct that transports urine from the kidney to the bladder. Stent:   A plastic hollow tube that is placed into the ureter, from the kidney to the bladder to prevent the ureter from swelling shut.  GENERAL INSTRUCTIONS:  Despite the fact that no skin incisions were used, the area around the ureter and bladder is raw and irritated. The stent is a foreign body which will further irritate the bladder wall. This irritation is manifested by increased frequency of urination, both day and night, and by an increase in the urge to urinate. In some, the urge to urinate is present almost always. Sometimes the urge is strong enough that you may not be able to stop yourself from urinating. The only real cure is to remove the stent and then give time for the bladder wall to heal which can't be done until the danger of the ureter swelling shut has passed, which varies.  You may see some blood  in your urine while the stent is in place and a few days afterwards. Do not be alarmed, even if the urine was clear for a while. Get off your feet and drink lots of fluids until clearing occurs. If you start to pass clots or don't improve, call us.  DIET: You may return to your normal diet immediately. Because of the raw surface of your bladder, alcohol, spicy foods, acid type foods and drinks with caffeine may cause irritation or frequency and should be used in moderation. To keep your urine flowing freely and to avoid constipation, drink plenty of fluids during the day ( 8-10 glasses ). Tip: Avoid cranberry juice because it is very acidic.  ACTIVITY: Your physical activity doesn't need to be restricted. However, if you are very active, you may see some blood in your urine. We suggest that you reduce your activity under these circumstances until the bleeding has stopped.  BOWELS: It is important to keep your bowels regular during the postoperative period. Straining with bowel movements can cause bleeding. A bowel movement every other day is reasonable. Use a mild laxative if needed, such as Milk of Magnesia 2-3 tablespoons, or 2 Dulcolax tablets. Call if you continue to have problems. If you have been taking narcotics for pain, before, during or after your surgery, you may be constipated. Take a laxative if necessary.   MEDICATION: You should resume your pre-surgery medications unless told not to. In addition you will often be given an antibiotic to prevent infection. These should be taken as prescribed until the bottles are finished unless you are having  an unusual reaction to one of the drugs.  PROBLEMS YOU SHOULD REPORT TO [place]:  Fevers over 100.5 Fahrenheit.  Heavy bleeding, or clots ( See above notes about blood in urine ).  Inability to urinate.  Drug reactions ( hives, rash, nausea, vomiting, diarrhea ).  Severe burning or pain with urination that is not improving.  FOLLOW-UP: You  will need a follow-up appointment to monitor your progress. Call for this appointment at the number listed above. Usually the first appointment will be about three to fourteen days after your surgery.      Renal Cell Cancer Renal cell cancer (kidney cancer) is a disease in which cancer cells form in the linings of tubules of the kidney. A cancer is an uncontrolled growth of cells and can occur anywhere in the body. Your kidneys are the organs which filter your blood and keep it clean by getting rid of waste products from your body in your urine. Urine passes from the kidneys into the bladder through long tubes called ureters. The bladder stores the urine until it is passed from the body through the tube which drains the bladder to the outside (urethra). SYMPTOMS  Early in the disease there may be no problems but as the disease worsens some of the problems seen are:  Blood in the urine.  Belly (abdominal) pain.  Decreased red blood cells (anemia).  A swelling in the belly.  Loss of appetite and weight loss.  Fever from unknown causes. DIAGNOSIS   Your caregiver will do a physical exam. This means they check you over.  Laboratory work may show problems (abnormalities) in the urine.  Plain X-rays and some specialized x-rays may be done. Some of these may include a CT scan. Sometimes an IVP (intravenous pyelogram) is done. In this test a dye is injected into a vein and pictures are then taken of the kidneys. The dye travels to the inside of the kidneys, ureters and bladder. Let your caregivers know if you are allergic to iodine or have had a past reaction to dyes used in X-rays. Other specialized x-rays sometimes taken are the MRI (magnetic resonance imaging) and PET scan (positron emission technology).  Angiography is sometimes done in which a dye is put into an artery leading to the kidney so the vessels surrounding the tumor or growth can be studied.  Your caregiver will explain the  value of the various testing to you and why it is necessary and helpful. If some of the above tests show a tumor or growth, sometimes a needle biopsy is done to confirm this and find out what the growth is made of. A fine needle aspiration (FNA) is used to remove a sliver of tissue from the kidney. This is done by sticking a needle through your skin and into the kidney. A specialist in looking at cells under the microscope (pathologist) then looks at the biopsy to determine what is wrong. The pathologist will check for cancer cells. Usually the previous tests mentioned have already given your surgeon enough information to know if an operation is needed. TREATMENT  You will want to discuss treatment choices with your caregivers and see what the best treatment for you is. This will depend on various factors including your age, other health problems and what stage your disease is in. All of this will play a part in your outcome. Some of the treatment choices are:  Surgery is the main treatment and chances of surviving without this are uncommon. Usually  the entire kidney is removed if this is possible. This is called a radical nephrectomy. The surgeon removes your kidney, the small gland on top of the kidney (adrenal gland) and the fat surrounding the kidney. You have another adrenal gland on the other side so removing one is not a problem. Sometimes a partial nephrectomy is done in people with one kidney or people with cancer on both sides. This may help to avoid use of an artificial kidney (dialysis) as only part of a kidney is needed to filter your blood.  Arterial embolization is another treatment. With this treatment a small catheter is threaded from your groin up into your kidney and material is injected into the artery supplying the tumor in the kidney. Without blood supply, the tumor dies off. Sometimes this procedure is used before kidney removal to cut down on blood loss.  Radiation therapy or x-ray  therapy can be used if your health is poor and will not allow surgery. Some of the problems with radiation include fatigue, nausea, vomiting, and damage to skin and surrounding tissues.  Chemotherapy may be used. This is a treatment which uses cancer killing medications to fight the cancer. The side effects depend on the medications used. Some side effects may include nausea, vomiting, loss of weight, loss of appetite, hair loss and other problems. Your caregiver can usually give you medications to overcome most of the problems.  There are many other forms of treatment your caregivers can discuss with you. Together you can determine which treatment will be best for you.  The more you know when dealing with these problems, the more comfortable you will be. Talk over your treatment over with your loved ones. Get a second opinion if you feel it will be of help. Often your surgeon and other caregivers may recommend this for your own comfort or peace of mind. Document Released: 10/30/2003 Document Revised: 03/13/2011 Document Reviewed: 10/10/2007 [facility] Patient Information 2015 [place], [place]. This information is not intended to replace advice given to you by your health care provider. Make sure you discuss any questions you have with your health care provider.

## 2013-06-19 NOTE — Anesthesia Procedure Notes (Signed)
Procedure Name: LMA Insertion Date/Time: 06/19/2013 10:08 AM Performed by: Denna Haggard D Pre-anesthesia Checklist: Patient identified, Emergency Drugs available, Suction available and Patient being monitored Patient Re-evaluated:Patient Re-evaluated prior to inductionOxygen Delivery Method: Circle System Utilized Preoxygenation: Pre-oxygenation with 100% oxygen Intubation Type: IV induction Ventilation: Mask ventilation without difficulty LMA: LMA inserted LMA Size: 5.0 Number of attempts: 1 Airway Equipment and Method: bite block Placement Confirmation: positive ETCO2 Tube secured with: Tape Dental Injury: Teeth and Oropharynx as per pre-operative assessment

## 2013-06-20 ENCOUNTER — Encounter (HOSPITAL_BASED_OUTPATIENT_CLINIC_OR_DEPARTMENT_OTHER): Payer: Self-pay | Admitting: Urology

## 2013-07-17 ENCOUNTER — Other Ambulatory Visit: Payer: Self-pay | Admitting: Urology

## 2013-07-18 ENCOUNTER — Telehealth: Payer: Self-pay | Admitting: Cardiovascular Disease

## 2013-07-18 NOTE — Telephone Encounter (Signed)
Dr. Rosario Jacks would like for [place] to fax over the patient's last office note. Fax# [phone]

## 2013-07-21 NOTE — Telephone Encounter (Signed)
Last office note faxed as requested.  

## 2013-08-12 ENCOUNTER — Encounter (HOSPITAL_COMMUNITY): Payer: Self-pay | Admitting: Pharmacy Technician

## 2013-08-14 NOTE — Patient Instructions (Addendum)
Jacob [name]  08/14/2013                           YOUR PROCEDURE IS SCHEDULED ON: 08/22/13               ENTER [facility] AND                            FOLLOW  SIGNS TO SHORT STAY CENTER                 ARRIVE AT SHORT STAY AT:  5:30 AM               CALL THIS NUMBER IF ANY PROBLEMS THE DAY OF SURGERY :               [phone]                                REMEMBER:   Do not eat food or drink liquids AFTER MIDNIGHT                  Take these medicines the morning of surgery with               A SIPS OF WATER :   RANEXA / PRILOSEC / ZYRTEC      Do not wear jewelry, make-up   Do not wear lotions, powders, or perfumes.   Do not shave legs or underarms 12 hrs. before surgery (men may shave face)  Do not bring valuables to the hospital.  Contacts, dentures or bridgework may not be worn into surgery.  Leave suitcase in the car. After surgery it may be brought to your room.  For patients admitted to the hospital more than one night, checkout time is            11:00 AM                                           ________________________________________________________________________                                                                        [name]  Before surgery, you can play an important role.  Because skin is not sterile, your skin needs to be as free of germs as possible.  You can reduce the number of germs on your skin by washing with CHG (chlorahexidine gluconate) soap before surgery.  CHG is an antiseptic cleaner which kills germs and bonds with the skin to continue killing germs even after washing. Please DO NOT use if you have an allergy to CHG or antibacterial soaps.  If your skin becomes reddened/irritated stop using the CHG and inform your nurse when you arrive at Short Stay. Do not shave (including legs and underarms) for at least 48 hours prior to the first CHG shower.  You may  shave your face. Please follow these instructions carefully:  1.  Shower with CHG Soap the night before surgery and the  morning of Surgery.   2.  If you choose to wash your hair, wash your hair first as usual with your  normal  Shampoo.   3.  After you shampoo, rinse your hair and body thoroughly to remove the  shampoo.                                         4.  Use CHG as you would any other liquid soap.  You can apply chg directly  to the skin and wash . Gently wash with scrungie or clean wascloth    5.  Apply the CHG Soap to your body ONLY FROM THE NECK DOWN.   Do not use on open                           Wound or open sores. Avoid contact with eyes, ears mouth and genitals (private parts).                        Genitals (private parts) with your normal soap.              6.  Wash thoroughly, paying special attention to the area where your surgery  will be performed.   7.  Thoroughly rinse your body with warm water from the neck down.   8.  DO NOT shower/wash with your normal soap after using and rinsing off  the CHG Soap .                9.  Pat yourself dry with a clean towel.             10.  Wear clean pajamas.             11.  Place clean sheets on your bed the night of your first shower and do not  sleep with pets.  Day of Surgery : Do not apply any lotions/deodorants the morning of surgery.  Please wear clean clothes to the hospital/surgery center.  FAILURE TO FOLLOW THESE INSTRUCTIONS MAY RESULT IN THE CANCELLATION OF YOUR SURGERY    PATIENT SIGNATURE_________________________________  ______________________________________________________________________

## 2013-08-15 ENCOUNTER — Ambulatory Visit (HOSPITAL_COMMUNITY)
Admission: RE | Admit: 2013-08-15 | Discharge: 2013-08-15 | Disposition: A | Discharge status: Home / Self Care | Payer: Medicare Other | Source: Ambulatory Visit | Attending: Anesthesiology | Admitting: Anesthesiology

## 2013-08-15 ENCOUNTER — Encounter (HOSPITAL_COMMUNITY)
Admission: RE | Admit: 2013-08-15 | Discharge: 2013-08-15 | Disposition: A | Discharge status: Home / Self Care | Payer: Medicare Other | Source: Ambulatory Visit | Attending: Urology | Admitting: Urology

## 2013-08-15 ENCOUNTER — Encounter (INDEPENDENT_AMBULATORY_CARE_PROVIDER_SITE_OTHER): Payer: Self-pay

## 2013-08-15 ENCOUNTER — Encounter (HOSPITAL_COMMUNITY): Payer: Self-pay

## 2013-08-15 DIAGNOSIS — Z01812 Encounter for preprocedural laboratory examination: Secondary | ICD-10-CM | POA: Insufficient documentation

## 2013-08-15 DIAGNOSIS — Z01818 Encounter for other preprocedural examination: Secondary | ICD-10-CM | POA: Diagnosis present

## 2013-08-15 HISTORY — DX: Personal history of other diseases of the digestive system: Z87.19

## 2013-08-15 HISTORY — DX: Personal history of urinary calculi: Z87.442

## 2013-08-15 LAB — BASIC METABOLIC PANEL
Anion gap: 11 (ref 5–15)
BUN: 19 mg/dL (ref 6–23)
CO2: 28 mEq/L (ref 19–32)
Calcium: 9.2 mg/dL (ref 8.4–10.5)
Chloride: 102 mEq/L (ref 96–112)
Creatinine, Ser: 1.37 mg/dL — ABNORMAL HIGH (ref 0.50–1.35)
GFR calc Af Amer: 53 mL/min — ABNORMAL LOW (ref >90)
GFR calc non Af Amer: 46 mL/min — ABNORMAL LOW (ref >90)
Glucose, Bld: 125 mg/dL — ABNORMAL HIGH (ref 70–99)
Potassium: 5.1 mEq/L (ref 3.7–5.3)
Sodium: 141 mEq/L (ref 137–147)

## 2013-08-15 LAB — CBC
HCT: 39 % (ref 39.0–52.0)
Hemoglobin: 12.8 g/dL — ABNORMAL LOW (ref 13.0–17.0)
MCH: 31.1 pg (ref 26.0–34.0)
MCHC: 32.8 g/dL (ref 30.0–36.0)
MCV: 94.9 fL (ref 78.0–100.0)
Platelets: 220 10*3/uL (ref 150–400)
RBC: 4.11 MIL/uL — ABNORMAL LOW (ref 4.22–5.81)
RDW: 13.3 % (ref 11.5–15.5)
WBC: 5.3 10*3/uL (ref 4.0–10.5)

## 2013-08-15 NOTE — Progress Notes (Signed)
08/15/13 1015  OBSTRUCTIVE SLEEP APNEA  Have you ever been diagnosed with sleep apnea through a sleep study? No  Do you snore loudly (loud enough to be heard through closed doors)?  0  Do you often feel tired, fatigued, or sleepy during the daytime? 0  Has anyone observed you stop breathing during your sleep? 0  Do you have, or are you being treated for high blood pressure? 1  BMI more than 35 kg/m2? 0  Age over 78 years old? 1  Neck circumference greater than 40 cm/16 inches? 1  Gender: 1  Obstructive Sleep Apnea Score 4  Score 4 or greater  Results sent to PCP

## 2013-08-21 MED ORDER — GENTAMICIN SULFATE 40 MG/ML IJ SOLN
390.0000 mg | INTRAVENOUS | Status: AC
  Administered 2013-08-22: 390 mg via INTRAVENOUS
  Filled 2013-08-21: qty 9.75

## 2013-08-22 ENCOUNTER — Inpatient Hospital Stay (HOSPITAL_COMMUNITY): Payer: Medicare Other

## 2013-08-22 ENCOUNTER — Inpatient Hospital Stay (HOSPITAL_COMMUNITY)
Admission: RE | Admit: 2013-08-22 | Discharge: 2013-08-26 | DRG: 657 | Disposition: A | Discharge status: Skilled Nursing Facility | Payer: Medicare Other | Source: Ambulatory Visit | Attending: Urology | Admitting: Urology

## 2013-08-22 ENCOUNTER — Encounter (HOSPITAL_COMMUNITY): Payer: Self-pay | Admitting: *Deleted

## 2013-08-22 ENCOUNTER — Encounter (HOSPITAL_COMMUNITY)
Admission: RE | Disposition: A | Discharge status: Skilled Nursing Facility | Payer: Self-pay | Source: Ambulatory Visit | Attending: Urology

## 2013-08-22 ENCOUNTER — Inpatient Hospital Stay (HOSPITAL_COMMUNITY): Payer: Medicare Other | Admitting: Registered Nurse

## 2013-08-22 ENCOUNTER — Encounter (HOSPITAL_COMMUNITY): Payer: Medicare Other | Admitting: Registered Nurse

## 2013-08-22 DIAGNOSIS — C649 Malignant neoplasm of unspecified kidney, except renal pelvis: Secondary | ICD-10-CM

## 2013-08-22 DIAGNOSIS — I447 Left bundle-branch block, unspecified: Secondary | ICD-10-CM | POA: Diagnosis not present

## 2013-08-22 DIAGNOSIS — I2510 Atherosclerotic heart disease of native coronary artery without angina pectoris: Secondary | ICD-10-CM | POA: Diagnosis present

## 2013-08-22 DIAGNOSIS — H409 Unspecified glaucoma: Secondary | ICD-10-CM | POA: Diagnosis present

## 2013-08-22 DIAGNOSIS — N400 Enlarged prostate without lower urinary tract symptoms: Secondary | ICD-10-CM | POA: Diagnosis present

## 2013-08-22 DIAGNOSIS — Z7982 Long term (current) use of aspirin: Secondary | ICD-10-CM | POA: Diagnosis not present

## 2013-08-22 DIAGNOSIS — Z87891 Personal history of nicotine dependence: Secondary | ICD-10-CM | POA: Diagnosis not present

## 2013-08-22 DIAGNOSIS — K4090 Unilateral inguinal hernia, without obstruction or gangrene, not specified as recurrent: Secondary | ICD-10-CM | POA: Diagnosis present

## 2013-08-22 DIAGNOSIS — N3289 Other specified disorders of bladder: Secondary | ICD-10-CM | POA: Diagnosis not present

## 2013-08-22 DIAGNOSIS — Z79899 Other long term (current) drug therapy: Secondary | ICD-10-CM

## 2013-08-22 DIAGNOSIS — Z961 Presence of intraocular lens: Secondary | ICD-10-CM

## 2013-08-22 DIAGNOSIS — Z9849 Cataract extraction status, unspecified eye: Secondary | ICD-10-CM

## 2013-08-22 DIAGNOSIS — C679 Malignant neoplasm of bladder, unspecified: Secondary | ICD-10-CM | POA: Diagnosis present

## 2013-08-22 DIAGNOSIS — K449 Diaphragmatic hernia without obstruction or gangrene: Secondary | ICD-10-CM | POA: Diagnosis present

## 2013-08-22 DIAGNOSIS — N289 Disorder of kidney and ureter, unspecified: Secondary | ICD-10-CM | POA: Diagnosis not present

## 2013-08-22 DIAGNOSIS — I10 Essential (primary) hypertension: Secondary | ICD-10-CM | POA: Diagnosis present

## 2013-08-22 DIAGNOSIS — K219 Gastro-esophageal reflux disease without esophagitis: Secondary | ICD-10-CM | POA: Diagnosis present

## 2013-08-22 DIAGNOSIS — Z9861 Coronary angioplasty status: Secondary | ICD-10-CM | POA: Diagnosis not present

## 2013-08-22 DIAGNOSIS — E785 Hyperlipidemia, unspecified: Secondary | ICD-10-CM | POA: Diagnosis present

## 2013-08-22 DIAGNOSIS — R443 Hallucinations, unspecified: Secondary | ICD-10-CM | POA: Diagnosis not present

## 2013-08-22 DIAGNOSIS — Z87442 Personal history of urinary calculi: Secondary | ICD-10-CM

## 2013-08-22 DIAGNOSIS — C659 Malignant neoplasm of unspecified renal pelvis: Secondary | ICD-10-CM | POA: Diagnosis present

## 2013-08-22 DIAGNOSIS — T40605A Adverse effect of unspecified narcotics, initial encounter: Secondary | ICD-10-CM | POA: Diagnosis not present

## 2013-08-22 HISTORY — PX: CYSTOSCOPY W/ URETERAL STENT PLACEMENT: SHX1429

## 2013-08-22 HISTORY — PX: INGUINAL HERNIA REPAIR: SHX194

## 2013-08-22 HISTORY — DX: Malignant neoplasm of unspecified kidney, except renal pelvis: C64.9

## 2013-08-22 HISTORY — PX: ROBOT ASSITED LAPAROSCOPIC NEPHROURETERECTOMY: SHX6077

## 2013-08-22 LAB — BASIC METABOLIC PANEL
Anion gap: 10 (ref 5–15)
BUN: 21 mg/dL (ref 6–23)
CHLORIDE: 103 meq/L (ref 96–112)
CO2: 29 mEq/L (ref 19–32)
Calcium: 8.4 mg/dL (ref 8.4–10.5)
Creatinine, Ser: 1.47 mg/dL — ABNORMAL HIGH (ref 0.50–1.35)
GFR calc Af Amer: 49 mL/min — ABNORMAL LOW (ref >90)
GFR, EST NON AFRICAN AMERICAN: 42 mL/min — AB (ref >90)
GLUCOSE: 170 mg/dL — AB (ref 70–99)
POTASSIUM: 3.8 meq/L (ref 3.7–5.3)
SODIUM: 142 meq/L (ref 137–147)

## 2013-08-22 LAB — HEMOGLOBIN AND HEMATOCRIT, BLOOD
HEMATOCRIT: 35.2 % — AB (ref 39.0–52.0)
HEMOGLOBIN: 11.8 g/dL — AB (ref 13.0–17.0)

## 2013-08-22 SURGERY — ROBOT ASSITED LAPAROSCOPIC NEPHROURETERECTOMY
Anesthesia: General | Site: Urethra | Laterality: Left

## 2013-08-22 MED ORDER — ACETAMINOPHEN 325 MG PO TABS: 650.0000 mg | ORAL_TABLET | ORAL | Status: DC | PRN

## 2013-08-22 MED ORDER — OXYBUTYNIN CHLORIDE 5 MG PO TABS
5.0000 mg | ORAL_TABLET | Freq: Three times a day (TID) | ORAL | Status: DC | PRN
  Administered 2013-08-23 – 2013-08-25 (×5): 5 mg via ORAL
  Filled 2013-08-22 (×4): qty 1

## 2013-08-22 MED ORDER — DEXTROSE IN LACTATED RINGERS 5 % IV SOLN
INTRAVENOUS | Status: DC
  Administered 2013-08-22 – 2013-08-23 (×5): via INTRAVENOUS

## 2013-08-22 MED ORDER — ROCURONIUM BROMIDE 100 MG/10ML IV SOLN
INTRAVENOUS | Status: DC | PRN
  Administered 2013-08-22: 50 mg via INTRAVENOUS
  Administered 2013-08-22: 10 mg via INTRAVENOUS

## 2013-08-22 MED ORDER — ONDANSETRON HCL 4 MG/2ML IJ SOLN
INTRAMUSCULAR | Status: AC
  Filled 2013-08-22: qty 2

## 2013-08-22 MED ORDER — ASPIRIN EC 81 MG PO TBEC
81.0000 mg | DELAYED_RELEASE_TABLET | Freq: Every day | ORAL | Status: DC
  Administered 2013-08-23 – 2013-08-26 (×4): 81 mg via ORAL
  Filled 2013-08-22 (×5): qty 1

## 2013-08-22 MED ORDER — ATROPINE SULFATE 0.4 MG/ML IJ SOLN
INTRAMUSCULAR | Status: AC
  Filled 2013-08-22: qty 2

## 2013-08-22 MED ORDER — IOHEXOL 300 MG/ML  SOLN
INTRAMUSCULAR | Status: DC | PRN
  Administered 2013-08-22: 12 mL via URETHRAL

## 2013-08-22 MED ORDER — ISOSORBIDE MONONITRATE ER 30 MG PO TB24
30.0000 mg | ORAL_TABLET | Freq: Every day | ORAL | Status: DC
  Administered 2013-08-22 – 2013-08-25 (×4): 30 mg via ORAL
  Filled 2013-08-22 (×5): qty 1

## 2013-08-22 MED ORDER — SODIUM CHLORIDE 0.9 % IJ SOLN
INTRAMUSCULAR | Status: DC | PRN
  Administered 2013-08-22: 20 mL

## 2013-08-22 MED ORDER — CLINDAMYCIN PHOSPHATE 600 MG/50ML IV SOLN
600.0000 mg | Freq: Three times a day (TID) | INTRAVENOUS | Status: AC
  Administered 2013-08-22 (×2): 600 mg via INTRAVENOUS
  Filled 2013-08-22 (×2): qty 50

## 2013-08-22 MED ORDER — ASPIRIN 81 MG PO CHEW
81.0000 mg | CHEWABLE_TABLET | Freq: Once | ORAL | Status: AC
  Administered 2013-08-22: 81 mg via ORAL
  Filled 2013-08-22 (×2): qty 1

## 2013-08-22 MED ORDER — PANTOPRAZOLE SODIUM 40 MG PO TBEC
40.0000 mg | DELAYED_RELEASE_TABLET | Freq: Every day | ORAL | Status: DC
  Administered 2013-08-23 – 2013-08-26 (×4): 40 mg via ORAL
  Filled 2013-08-22 (×4): qty 1

## 2013-08-22 MED ORDER — LACTATED RINGERS IR SOLN
Status: DC | PRN
  Administered 2013-08-22: 1

## 2013-08-22 MED ORDER — CLINDAMYCIN PHOSPHATE 900 MG/50ML IV SOLN
900.0000 mg | INTRAVENOUS | Status: AC
  Administered 2013-08-22: 900 mg via INTRAVENOUS

## 2013-08-22 MED ORDER — LATANOPROST 0.005 % OP SOLN
1.0000 [drp] | Freq: Every day | OPHTHALMIC | Status: DC
  Administered 2013-08-22 – 2013-08-25 (×4): 1 [drp] via OPHTHALMIC
  Filled 2013-08-22: qty 2.5

## 2013-08-22 MED ORDER — NEOSTIGMINE METHYLSULFATE 10 MG/10ML IV SOLN
INTRAVENOUS | Status: AC
  Filled 2013-08-22: qty 1

## 2013-08-22 MED ORDER — FENTANYL CITRATE 0.05 MG/ML IJ SOLN
INTRAMUSCULAR | Status: AC
Start: 2013-08-22 — End: 2013-08-22
  Filled 2013-08-22: qty 5

## 2013-08-22 MED ORDER — FENTANYL CITRATE 0.05 MG/ML IJ SOLN
INTRAMUSCULAR | Status: DC | PRN
  Administered 2013-08-22 (×3): 50 ug via INTRAVENOUS

## 2013-08-22 MED ORDER — SENNA 8.6 MG PO TABS
1.0000 | ORAL_TABLET | Freq: Two times a day (BID) | ORAL | Status: DC
  Administered 2013-08-22 – 2013-08-26 (×8): 8.6 mg via ORAL
  Filled 2013-08-22 (×8): qty 1

## 2013-08-22 MED ORDER — ONDANSETRON HCL 4 MG/2ML IJ SOLN: 4.0000 mg | INTRAMUSCULAR | Status: DC | PRN

## 2013-08-22 MED ORDER — FENTANYL CITRATE 0.05 MG/ML IJ SOLN
25.0000 ug | INTRAMUSCULAR | Status: DC | PRN
  Administered 2013-08-22 – 2013-08-23 (×6): 25 ug via INTRAVENOUS
  Filled 2013-08-22 (×6): qty 2

## 2013-08-22 MED ORDER — DIPHENHYDRAMINE HCL 12.5 MG/5ML PO ELIX: 12.5000 mg | ORAL_SOLUTION | Freq: Four times a day (QID) | ORAL | Status: DC | PRN

## 2013-08-22 MED ORDER — PROMETHAZINE HCL 25 MG/ML IJ SOLN: 6.2500 mg | INTRAMUSCULAR | Status: DC | PRN

## 2013-08-22 MED ORDER — SODIUM CHLORIDE 0.9 % IJ SOLN
INTRAMUSCULAR | Status: AC
  Filled 2013-08-22: qty 10

## 2013-08-22 MED ORDER — EPHEDRINE SULFATE 50 MG/ML IJ SOLN
INTRAMUSCULAR | Status: AC
  Filled 2013-08-22: qty 1

## 2013-08-22 MED ORDER — HYDROMORPHONE HCL PF 1 MG/ML IJ SOLN
INTRAMUSCULAR | Status: AC
  Filled 2013-08-22: qty 1

## 2013-08-22 MED ORDER — HYDROCODONE-ACETAMINOPHEN 5-325 MG PO TABS
1.0000 | ORAL_TABLET | ORAL | Status: DC | PRN
  Administered 2013-08-23 – 2013-08-24 (×6): 1 via ORAL
  Filled 2013-08-22 (×6): qty 1

## 2013-08-22 MED ORDER — ATROPINE SULFATE 0.4 MG/ML IJ SOLN
INTRAMUSCULAR | Status: AC
  Filled 2013-08-22: qty 1

## 2013-08-22 MED ORDER — PHENYLEPHRINE 40 MCG/ML (10ML) SYRINGE FOR IV PUSH (FOR BLOOD PRESSURE SUPPORT)
PREFILLED_SYRINGE | INTRAVENOUS | Status: AC
  Filled 2013-08-22: qty 10

## 2013-08-22 MED ORDER — RANOLAZINE ER 500 MG PO TB12
500.0000 mg | ORAL_TABLET | Freq: Two times a day (BID) | ORAL | Status: DC
  Administered 2013-08-22 – 2013-08-23 (×2): 500 mg via ORAL
  Filled 2013-08-22 (×3): qty 1

## 2013-08-22 MED ORDER — BUPIVACAINE LIPOSOME 1.3 % IJ SUSP
20.0000 mL | Freq: Once | INTRAMUSCULAR | Status: AC
  Administered 2013-08-22: 20 mL
  Filled 2013-08-22: qty 20

## 2013-08-22 MED ORDER — WATER FOR IRRIGATION, STERILE IR SOLN
Status: DC | PRN
  Administered 2013-08-22: 3000 mL via SURGICAL_CAVITY

## 2013-08-22 MED ORDER — SIMVASTATIN 10 MG PO TABS
10.0000 mg | ORAL_TABLET | Freq: Every day | ORAL | Status: DC
  Administered 2013-08-23 – 2013-08-25 (×3): 10 mg via ORAL
  Filled 2013-08-22 (×5): qty 1

## 2013-08-22 MED ORDER — PROPOFOL 10 MG/ML IV BOLUS
INTRAVENOUS | Status: AC
  Filled 2013-08-22: qty 20

## 2013-08-22 MED ORDER — LACTATED RINGERS IV SOLN
INTRAVENOUS | Status: DC | PRN
  Administered 2013-08-22: 07:00:00 via INTRAVENOUS

## 2013-08-22 MED ORDER — HYDROCHLOROTHIAZIDE 25 MG PO TABS
25.0000 mg | ORAL_TABLET | Freq: Every day | ORAL | Status: DC
  Administered 2013-08-24 – 2013-08-26 (×3): 25 mg via ORAL
  Filled 2013-08-22 (×5): qty 1

## 2013-08-22 MED ORDER — NEOSTIGMINE METHYLSULFATE 10 MG/10ML IV SOLN
INTRAVENOUS | Status: DC | PRN
  Administered 2013-08-22: 5 mg via INTRAVENOUS

## 2013-08-22 MED ORDER — PROPOFOL 10 MG/ML IV BOLUS
INTRAVENOUS | Status: DC | PRN
  Administered 2013-08-22: 120 mg via INTRAVENOUS

## 2013-08-22 MED ORDER — HYDROMORPHONE HCL PF 1 MG/ML IJ SOLN
0.2500 mg | INTRAMUSCULAR | Status: DC | PRN
  Administered 2013-08-22 (×3): 0.5 mg via INTRAVENOUS

## 2013-08-22 MED ORDER — SODIUM CHLORIDE 0.9 % IJ SOLN
INTRAMUSCULAR | Status: AC
  Filled 2013-08-22: qty 50

## 2013-08-22 MED ORDER — EPHEDRINE SULFATE 50 MG/ML IJ SOLN
INTRAMUSCULAR | Status: DC | PRN
  Administered 2013-08-22: 10 mg via INTRAVENOUS
  Administered 2013-08-22 (×3): 5 mg via INTRAVENOUS

## 2013-08-22 MED ORDER — DIPHENHYDRAMINE HCL 50 MG/ML IJ SOLN: 12.5000 mg | Freq: Four times a day (QID) | INTRAMUSCULAR | Status: DC | PRN

## 2013-08-22 MED ORDER — LIDOCAINE HCL (CARDIAC) 20 MG/ML IV SOLN
INTRAVENOUS | Status: AC
  Filled 2013-08-22: qty 5

## 2013-08-22 MED ORDER — PHENYLEPHRINE HCL 10 MG/ML IJ SOLN
INTRAMUSCULAR | Status: DC | PRN
  Administered 2013-08-22 (×2): 80 ug via INTRAVENOUS

## 2013-08-22 MED ORDER — ATROPINE SULFATE 0.4 MG/ML IJ SOLN
INTRAMUSCULAR | Status: DC | PRN
  Administered 2013-08-22: .6 mg via INTRAVENOUS

## 2013-08-22 MED ORDER — DOCUSATE SODIUM 100 MG PO CAPS
100.0000 mg | ORAL_CAPSULE | Freq: Two times a day (BID) | ORAL | Status: DC
  Administered 2013-08-22 – 2013-08-26 (×8): 100 mg via ORAL
  Filled 2013-08-22 (×9): qty 1

## 2013-08-22 MED ORDER — GLYCOPYRROLATE 0.2 MG/ML IJ SOLN
INTRAMUSCULAR | Status: DC | PRN
  Administered 2013-08-22: .8 mg via INTRAVENOUS

## 2013-08-22 MED ORDER — GLYCOPYRROLATE 0.2 MG/ML IJ SOLN
INTRAMUSCULAR | Status: AC
  Filled 2013-08-22: qty 4

## 2013-08-22 MED ORDER — LIDOCAINE HCL (CARDIAC) 20 MG/ML IV SOLN
INTRAVENOUS | Status: DC | PRN
  Administered 2013-08-22: 80 mg via INTRAVENOUS

## 2013-08-22 MED ORDER — ROCURONIUM BROMIDE 100 MG/10ML IV SOLN
INTRAVENOUS | Status: AC
Start: 2013-08-22 — End: 2013-08-22
  Filled 2013-08-22: qty 1

## 2013-08-22 MED ORDER — LORATADINE 10 MG PO TABS
10.0000 mg | ORAL_TABLET | Freq: Every day | ORAL | Status: DC
  Administered 2013-08-23 – 2013-08-26 (×4): 10 mg via ORAL
  Filled 2013-08-22 (×4): qty 1

## 2013-08-22 MED ORDER — CLINDAMYCIN PHOSPHATE 900 MG/50ML IV SOLN
INTRAVENOUS | Status: AC
  Filled 2013-08-22: qty 50

## 2013-08-22 MED ORDER — ONDANSETRON HCL 4 MG/2ML IJ SOLN
INTRAMUSCULAR | Status: DC | PRN
  Administered 2013-08-22: 4 mg via INTRAVENOUS

## 2013-08-22 SURGICAL SUPPLY — 89 items
BAG URO CATCHER STRL LF (DRAPE) ×4 IMPLANT
BASKET LASER NITINOL 1.9FR (BASKET) IMPLANT
CABLE HI FREQUENCY MONOPOLAR (ELECTROSURGICAL) ×4 IMPLANT
CANNULA SEAL DVNC (CANNULA) ×4 IMPLANT
CANNULA SEALS DA VINCI (CANNULA) ×4
CATH FOLEY 3WAY  5CC 18FR (CATHETERS) ×6
CATH FOLEY 3WAY 5CC 18FR (CATHETERS) ×6 IMPLANT
CATH INTERMIT  6FR 70CM (CATHETERS) ×4 IMPLANT
CHLORAPREP W/TINT 26ML (MISCELLANEOUS) ×4 IMPLANT
CLIP LIGATING HEM O LOK PURPLE (MISCELLANEOUS) ×4 IMPLANT
CLIP LIGATING HEMO LOK XL GOLD (MISCELLANEOUS) ×4 IMPLANT
CLIP LIGATING HEMO O LOK GREEN (MISCELLANEOUS) ×4 IMPLANT
CLOTH BEACON ORANGE TIMEOUT ST (SAFETY) ×4 IMPLANT
CORDS BIPOLAR (ELECTRODE) ×4 IMPLANT
COVER SURGICAL LIGHT HANDLE (MISCELLANEOUS) ×4 IMPLANT
COVER TIP SHEARS 8 DVNC (MISCELLANEOUS) ×2 IMPLANT
COVER TIP SHEARS 8MM DA VINCI (MISCELLANEOUS) ×2
CUTTER ECHEON FLEX ENDO 45 340 (ENDOMECHANICALS) ×4 IMPLANT
DECANTER SPIKE VIAL GLASS SM (MISCELLANEOUS) ×4 IMPLANT
DERMABOND ADVANCED (GAUZE/BANDAGES/DRESSINGS) ×2
DERMABOND ADVANCED .7 DNX12 (GAUZE/BANDAGES/DRESSINGS) ×2 IMPLANT
DRAIN CHANNEL 15F RND FF 3/16 (WOUND CARE) ×4 IMPLANT
DRAPE CAMERA CLOSED 9X96 (DRAPES) ×4 IMPLANT
DRAPE INCISE IOBAN 66X45 STRL (DRAPES) ×4 IMPLANT
DRAPE LAPAROSCOPIC ABDOMINAL (DRAPES) ×4 IMPLANT
DRAPE LG THREE QUARTER DISP (DRAPES) ×4 IMPLANT
DRAPE TABLE BACK 44X90 PK DISP (DRAPES) ×4 IMPLANT
DRAPE WARM FLUID 44X44 (DRAPE) ×4 IMPLANT
DRESSING SURGICEL FIBRLLR 1X2 (HEMOSTASIS) IMPLANT
DRSG SURGICEL FIBRILLAR 1X2 (HEMOSTASIS)
ELECT REM PT RETURN 9FT ADLT (ELECTROSURGICAL) ×4
ELECTRODE REM PT RTRN 9FT ADLT (ELECTROSURGICAL) ×2 IMPLANT
EVACUATOR SILICONE 100CC (DRAIN) ×4 IMPLANT
GLOVE BIOGEL M STRL SZ7.5 (GLOVE) ×12 IMPLANT
GOWN STRL REUS W/TWL LRG LVL3 (GOWN DISPOSABLE) ×12 IMPLANT
GOWN STRL REUS W/TWL XL LVL3 (GOWN DISPOSABLE) ×8 IMPLANT
GUIDEWIRE ANG ZIPWIRE 038X150 (WIRE) ×4 IMPLANT
GUIDEWIRE STR DUAL SENSOR (WIRE) IMPLANT
HEMOSTAT SURGICEL 4X8 (HEMOSTASIS) IMPLANT
KIT ACCESSORY DA VINCI DISP (KITS) ×2
KIT ACCESSORY DVNC DISP (KITS) ×2 IMPLANT
KIT BASIN OR (CUSTOM PROCEDURE TRAY) ×4 IMPLANT
MANIFOLD NEPTUNE II (INSTRUMENTS) ×4 IMPLANT
NEEDLE INSUFFLATION 14GA 120MM (NEEDLE) ×4 IMPLANT
PACK CYSTO (CUSTOM PROCEDURE TRAY) ×4 IMPLANT
PENCIL BUTTON HOLSTER BLD 10FT (ELECTRODE) ×4 IMPLANT
POSITIONER SURGICAL ARM (MISCELLANEOUS) ×4 IMPLANT
POUCH ENDO CATCH II 15MM (MISCELLANEOUS) ×4 IMPLANT
POUCH SPECIMEN RETRIEVAL 10MM (ENDOMECHANICALS) IMPLANT
RELOAD STAPLER WHITE 60MM (STAPLE) ×4 IMPLANT
RELOAD WH ECHELON 45 (STAPLE) ×20 IMPLANT
RETRACTOR LAPSCP 12X46 CVD (ENDOMECHANICALS) IMPLANT
RTRCTR LAPSCP 12X46 CVD (ENDOMECHANICALS)
SET TUBE IRRIG SUCTION NO TIP (IRRIGATION / IRRIGATOR) ×4 IMPLANT
SOLUTION ANTI FOG 6CC (MISCELLANEOUS) ×4 IMPLANT
SOLUTION ELECTROLUBE (MISCELLANEOUS) ×4 IMPLANT
SPONGE LAP 18X18 X RAY DECT (DISPOSABLE) ×4 IMPLANT
SPONGE LAP 4X18 X RAY DECT (DISPOSABLE) ×4 IMPLANT
STAPLE ECHEON FLEX 60 POW ENDO (STAPLE) ×4 IMPLANT
STAPLER RELOAD WHITE 60MM (STAPLE) ×8
STENT CONTOUR 6FRX26X.038 (STENTS) ×4 IMPLANT
SURGILUBE 3G PEEL PACK STRL (MISCELLANEOUS) ×4 IMPLANT
SUT ETHILON 3 0 PS 1 (SUTURE) ×4 IMPLANT
SUT MNCRL AB 4-0 PS2 18 (SUTURE) ×8 IMPLANT
SUT MON AB 2-0 SH 27 (SUTURE) ×2
SUT MON AB 2-0 SH27 (SUTURE) ×2 IMPLANT
SUT PDS AB 1 CT1 27 (SUTURE) ×12 IMPLANT
SUT PDS AB 1 CTX 36 (SUTURE) ×8 IMPLANT
SUT PROLENE 2 0 CT2 30 (SUTURE) ×4 IMPLANT
SUT V-LOC BARB 180 2/0GR6 GS22 (SUTURE) ×4
SUT VIC AB 2-0 SH 27 (SUTURE)
SUT VIC AB 2-0 SH 27X BRD (SUTURE) IMPLANT
SUT VICRYL 0 UR6 27IN ABS (SUTURE) ×4 IMPLANT
SUT VLOC BARB 180 ABS3/0GR12 (SUTURE) ×4
SUTURE V-LC BRB 180 2/0GR6GS22 (SUTURE) ×2 IMPLANT
SUTURE VLOC BRB 180 ABS3/0GR12 (SUTURE) ×2 IMPLANT
TOWEL OR NON WOVEN STRL DISP B (DISPOSABLE) ×4 IMPLANT
TRAY LAP CHOLE (CUSTOM PROCEDURE TRAY) ×4 IMPLANT
TROCAR 12M 150ML BLUNT (TROCAR) ×4 IMPLANT
TROCAR BLADELESS OPT 5 75 (ENDOMECHANICALS) IMPLANT
TROCAR UNIVERSAL OPT 12M 100M (ENDOMECHANICALS) ×4 IMPLANT
TROCAR XCEL 12X100 BLDLESS (ENDOMECHANICALS) ×4 IMPLANT
TUBE FEEDING 8FR 16IN STR KANG (MISCELLANEOUS) ×8 IMPLANT
TUBING CONNECTING 10 (TUBING) ×3 IMPLANT
TUBING CONNECTING 10' (TUBING) ×1
TUBING INSUFFLATION 10FT LAP (TUBING) ×4 IMPLANT
URINEMETER 200ML W/220 (MISCELLANEOUS) ×4 IMPLANT
WATER STERILE IRR 1000ML UROMA (IV SOLUTION) ×4 IMPLANT
WATER STERILE IRR 1500ML POUR (IV SOLUTION) ×8 IMPLANT

## 2013-08-22 NOTE — Anesthesia Postprocedure Evaluation (Signed)
  Anesthesia Post-op Note  Patient: Jacob [name]  Procedure(s) Performed: Procedure(s): ROBOT ASSITED LAPAROSCOPIC NEPHROURETERECTOMY (Left) CYSTOSCOPY WITH RETROGRADE PYELOGRAM/URETERAL STENT PLACEMENT (Left) LAPAROSCOPIC INGUINAL HERNIA (Left)  Patient Location: PACU  Anesthesia Type:General  Level of Consciousness: awake, alert  and oriented  Airway and Oxygen Therapy: Patient Spontanous Breathing  Post-op Pain: mild  Post-op Assessment: Post-op Vital signs reviewed  Post-op Vital Signs: Reviewed  Last Vitals:  Filed Vitals:   08/22/13 1300  BP: 136/48  Pulse: 70  Temp: 36.4 C  Resp: 12    Complications: No apparent anesthesia complications

## 2013-08-22 NOTE — Progress Notes (Signed)
B Met and Hgb. And Hct. Drawn by lab 

## 2013-08-22 NOTE — Op Note (Signed)
NAME:  NISHAN, OVENS NO.:  [id number]  MEDICAL RECORD NO.:  [id number]  LOCATION:  [facility]                         FACILITY:  [facility]  PHYSICIAN:  [name], MD     DATE OF BIRTH:  20-Oct-1928  DATE OF PROCEDURE: 08/22/2013 DATE OF DISCHARGE:                              OPERATIVE REPORT   DIAGNOSES:  Large volume left renal pelvis cancer, history of bladder cancer.  PROCEDURES: 1. Robotic-assisted laparoscopic left nephroureterectomy with bladder     cuff excision. 2. Laparoscopic left inguinal hernia repair. 3. Cystoscopy with left retrograde pyelogram with interpretation. 4. Insertion of left ureteral stent.  FINDINGS: 1. Single artery, single vein, left renovascular anatomy with large     inferomedial lumbar vein as well. 2. Left inguinal hernia defect approximately 3-4 cm such that repair felt to be warranted following mobilization of the bladder, lateral     wall and descending colon.  ESTIMATED BLOOD LOSS:  100 mL.  SPECIMEN:  Left nephroureterectomy with bladder cuff.  ASSISTANT:  Charlton Haws, M.D.  INDICATIONS:  Mr. 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 He was found on recurrence hematuria, restaging to have a large multifocal left renal pelvis filling defects on CT urogram.  He underwent diagnostic ureteroscopy, which corroborated a large volume of papillary tumor in the left renal pelvis, although, his washings and cytology were somewhat equivocal.  The constellation of findings is most consistent with left upper tract urothelial carcinoma.  Options were discussed for management including kidney, non-kidney-sparing approaches.  He received left nephroureterectomy with minimally invasive assistance.  Informed consent was obtained and placed in the medical record.  PROCEDURE IN DETAIL:  The patient being Jadon Harbaugh, was verified. Procedure being  left nephroureterectomy was confirmed.  Procedure was carried out.  Time-out was performed.  Intravenous antibiotics were administered.  General endotracheal anesthesia was introduced.  The patient was initially placed into a low lithotomy position, and sterile field was created by prepping and draping the patient's penis, perineum, and proximal thighs using iodine x3.  Next, cystourethroscopy was performed using 22-French rigid cystoscope with 12-degree offset lens. Inspection of the anterior and posterior urethra was unremarkable. Inspection of the urinary bladder revealed no diverticula, calcifications, papular lesions.  There was area of likely old resection site in the left wall.  There was no obvious recurrence in this area. The left ureteral orifice was cannulated with a 6-French end-hold catheter and left retrograde pyelogram was obtained.  Left retrograde pyelogram demonstrated a single left ureter with single- system left kidney.  No filling defects or narrowing noted.  A 0.038 Glidewire was advanced at the level of the upper pole, over which, a new 6 x 26 Contour-type stent was carefully deployed, proximal end in upper pole and distal end in urinary bladder.  The cystoscope was exchanged for a 3-way Foley catheter to straight drain.  The irrigation port being available for use later in the procedure if necessary.  The patient was then repositioned into a left side up full flank position applying 15 degrees stable flexion, superior arm elevator, axillary roll, sequential compression devices, and beanbag.  He has further  fashioned on the operative table using 3-inch tape over foam padding and found to be suitably positioned.  A new sterile field was created by prepping and draping the patient's entire left flank and abdomen using chlorhexidine gluconate.  Next, a high-flow, low-pressure pneumoperitoneum was obtained using Veress technique and the left lower quadrant  having passed the aspiration and drop test.  A 12-mm robotic camera port was then placed and positioned approximately 3 fingerbreadths lateral and superior to the umbilicus.  Laparoscopic examination of the peritoneal cavity revealed no significant adhesions or visceral injury in the left hemiabdomen.  There were some loose adhesions in the right lower quadrant consistent with prior open appendectomy; however, felt to be no need for mobilization of these.  The patient was noted to have significant left inguinal hernia.  At this point, additional ports were then placed as follows:  Left subcostal 8-mm robotic port, left midline inferior 8-mm robotic port approximately 3 fingerbreadths superior to pubic symphysis, a left far lateral 8-mm robotic port approximately 4 fingerbreadths superior medial to the anterior superior iliac spine and two 12-mm assistant ports in the midline, one approximately 2 fingerbreadths above the camera port and one 3 fingerbreadths below the camera port.  Robot was docked and passed through electronic checks. Initial attention was directed to development of the retroperitoneum. Incision was visualized up to the descending colon from the area of the splenic flexure towards the area of the internal ring and the colon was carefully swept medially.  The aspect of the inferior pole of the kidney was identified, placed on gentle lateral traction.  Dissection proceeded medial to this.  The gonadal vessels and ureter were encountered and placed on gentle lateral traction.  Dissection was then proceeded within this triangle superiorly to the area of the renal hilum.  Renal hilum consisted of single artery, single vein, dominant renovascular anatomy. There was also a large lumbar vein inferiorly as well.  That did somewhat inhibit direct visualization of the artery, assist to lumbar, was taken using vascular load stapler.  The artery was controlled using extra large  Hem-o-Lok clip proximal followed by stapling of the vein and then stapling of the artery distal to the previous clip, which resulted in excellent hemostatic control of the hilum.  The left adrenal was partially spared by separating it from its other structures using vascular load stapler.  Superior attachments were taken down as where lateral attachment was completely mobilized the kidney aspect of the dissection.  Attention was then directed at ureteral dissection. Incision was then made lateral to the left medial umbilical ligaments close to the inguinal ring and coursing towards the midline.  This was used as a bucket handle to hold medially, thus exposing the retroperitoneum lateral to the bladder.  The iliac vessels were identified and the ureter was carefully swept medial to this. Dissection was then proceeded circumferentially from the area of the lower pole of the kidney around the ureter towards the area of the bladder hiatus.  Dissection was continued until the light blue bladder mucosa was identified.  A 3-0 V-Loc stay suture was applied to the inferior-lateral aspect, used as a stay stitch.  Extra large clip was placed across the ureter distally for vent, antegrade efflux of tumor cells and the ureter was resected away from the bladder with a small margin of bladder cuff.  This completely freed up the specimen.  Bladder cuff was closed by continuing the previous 3-0 V-Loc anchor stitch reapproximating seromuscular mucosal layer of  the bladder and a second layer of 2-0 V-Loc was applied to this, resulted in excellent visual reapproximation, no obvious leak.  The pelvis was inspected again and the area of the left inguinal hernia was carefully inspected as well given the relatively large defect and considerable mobilization on the left hemiabdomen.  Thus with the repair, this was warranted to prevent later strangulation.  As such, 2-0 Prolene was used in a running  fashion reapproximating the fascial edges, completely obliterating the fascial defect, taking great care to avoid excessive tension of the gonadal vessels and this did not occur.  Following these maneuvers, hemostasis was excellent.  The area of the hilum was inspected again and found to be hemostatic.  The nephroureterectomy specimen was placed into an extra large EndoCatch bag.  Robot was undocked.  Specimen was retrieved by connecting the previous assistant port sites in the midline and removing the nephroureterectomy specimen and setting it aside for permanent pathology.  The previous 12-mm camera port site was closed at the level of the fascia using figure-of-eight Vicryl.  A closed suction drain was brought through the previous left lateral most assistant port site to the area of the peritoneal cavity.  Dressing was applied.  The midline extraction site was then closed at the level of the fascia using figure- of-eight PDS x10, followed by reapproximation of Scarpa's with running Vicryl.  All incisions sites were infiltrated with dilute lipolyzed Marcaine, reapproximated at the level of the skin using subcuticular Monocryl followed by Dermabond.  Procedure was then terminated.  The patient tolerated the procedure well.  There were no immediate periprocedural complications. The patient was taken to the postanesthesia care unit in stable condition.          ______________________________ [name], MD     TM/MEDQ  D:  08/22/2013  T:  08/22/2013  Job:  [id number]

## 2013-08-22 NOTE — Progress Notes (Signed)
Jefferey Pica, R.N. To look for B met results.

## 2013-08-22 NOTE — Brief Op Note (Signed)
08/22/2013  11:34 AM  PATIENT:  Jacob [name]  80 y.o. male  PRE-OPERATIVE DIAGNOSIS:  LEFT RENAL PELVIS CANCER  POST-OPERATIVE DIAGNOSIS:  LEFT RENAL PELVIS CANCER, LEFT INGUINAL HERNIA  PROCEDURE:  Procedure(s): ROBOT ASSITED LAPAROSCOPIC NEPHROURETERECTOMY (Left) CYSTOSCOPY WITH RETROGRADE PYELOGRAM/URETERAL STENT PLACEMENT (Left) LAPAROSCOPIC INGUINAL HERNIA (Left)  SURGEON:  Surgeon(s) and Role:    * [name], MD - Primary  PHYSICIAN ASSISTANT:   ASSISTANTS: Charlton Haws MD   ANESTHESIA:   local and general  EBL:  Total I/O In: 2000 [I.V.:2000] Out: 200 [Urine:200]  BLOOD ADMINISTERED:none  DRAINS: JP to bulb suciton LLQ   LOCAL MEDICATIONS USED:  MARCAINE     SPECIMEN:  Source of Specimen:  Left nephroureterectomy  DISPOSITION OF SPECIMEN:  PATHOLOGY  COUNTS:  YES  TOURNIQUET:  * No tourniquets in log *  DICTATION: .Other Dictation: Dictation Number [id number]  PLAN OF CARE: Admit to inpatient   PATIENT DISPOSITION:  PACU - hemodynamically stable.   Delay start of Pharmacological VTE agent (>24hrs) due to surgical blood loss or risk of bleeding: yes

## 2013-08-22 NOTE — H&P (Signed)
Jacob [name] is an 80 y.o. male.    Chief Complaint: Pre-op Left Robotic Nephroureterectomy  HPI:     1 - Left Renal Pelvis Cancer - PT with lew left renal pelvis mass by CT 03/2013 in [place] on eval abd pain. NO local adenopathy or contralateral lesions. Left renovascular anatomy 1 artery, 1 vein.  Underwent operative eval by Dr. Tyrone Apple 06/2013 at which point no bladder lesions noted, but ureteroscopy confirms multifocal papillary neoplasm in left renal pelvis and kidney (operative images reviewed).    2 - Bladder Cancer -  2012 - TaG3 by TURBT 2013 - Induction BCG x 6 2014 - NED by surv cystos  PMH sig for CAD/Stent (no deficits at baseline, just ASA 81). Appy. NO other chest / abd surgery. He does live alone.   Today Jacob [name] is seen to proceed with left robotic nephroureterectomy for his left renal pelvis cancer. Recent Cr 1.05 2015. His PCP is Dr. Vedia Coffer in [place].    Past Medical History  Diagnosis Date  . GERD (gastroesophageal reflux disease)   . H/O hiatal hernia   . Arthritis     hands  . BPH (benign prostatic hypertrophy)   . History of atrial fibrillation PT STATES NO PROBLEM SINCE 2006  AFTER CARDIAC STENTING  . S/P primary angioplasty with coronary stent 2006  . Glaucoma of both eyes   . Transitional cell carcinoma of bladder FOLLOWED BY DR Gaynelle Arabian    BLADDER INSTILLATION TX'S  . Hyperlipidemia   . Hypertension   . Renal lesion     LEFT UPPER POLE  . LBBB (left bundle branch block)   . First degree heart block   . Coronary artery disease CARDIOLOGIST--  DR Rogue Jury ARIDA Velora Heckler CADIOLOGIST IN [place])    2006  ---S/P STENT X2  TO RCA///    [facility]  05-03-2012  ---  SHOW PATENT STENT RCA AND MILD NON-OBSTRUCTIVE CALCIFIED LAD DISEASE, TREATED MEDICALLY  . History of diverticulitis   . Cancer of renal pelvis     left  . History of kidney stones     Past Surgical History  Procedure Laterality Date  . Cystoscopy with biopsy  04/13/2011   RECURRENT TCC OF BLADDER    Procedure: CYSTOSCOPY WITH BIOPSY;  Surgeon: [name], MD;  Location: [facility];  Service: Urology;  Laterality: N/A;  Cold Cup Biopsy  . Transurethral resection of bladder tumor  12-22-2010    BLADDER CANCER  . Appendectomy  02/1988  . Cataract extraction w/ intraocular lens  implant, bilateral  2011  . Transurethral resection of bladder tumor  08/04/2011    Procedure: TRANSURETHRAL RESECTION OF BLADDER TUMOR (TURBT);  Surgeon: [name], MD;  Location: [facility];  Service: Urology;  Laterality: N/A;  . Coronary angioplasty with stent placement  2006    [facility]    X2 STENTS  TO RCA  . Cystoscopy with retrograde pyelogram, ureteroscopy and stent placement Left 06/19/2013    Procedure: CYSTOSCOPY WITH LEFT RETROGRADE PYELOGRAM, LEFT URETEROSCOPY, ureteral balloon dilatation, BIOPSY LEFT KIDNEY;  Surgeon: [name], MD;  Location: [facility];  Service: Urology;  Laterality: Left;    Family History  Problem Relation Age of Onset  . Heart attack Mother   . Heart attack Father    Social History:  reports that he quit smoking about 12 years ago. His smoking use included Cigarettes. He has a 50 pack-year smoking history. He has never used smokeless tobacco. He reports  that he does not drink alcohol or use illicit drugs.  Allergies:  Allergies  Allergen Reactions  . Penicillins Hives    BLISTERS    Medications Prior to Admission  Medication Sig Dispense Refill  . cetirizine (ZYRTEC) 10 MG tablet Take 10 mg by mouth every morning. ALLERGIES       . hydrochlorothiazide (HYDRODIURIL) 25 MG tablet Take 25 mg by mouth every morning.       . isosorbide mononitrate (IMDUR) 30 MG 24 hr tablet Take 30 mg by mouth at bedtime.       Marland Kitchen latanoprost (XALATAN) 0.005 % ophthalmic solution Place 1 drop into both eyes at bedtime.       Marland Kitchen losartan (COZAAR) 50 MG tablet Take 50 mg by mouth every morning.       . lovastatin  (MEVACOR) 20 MG tablet Take 20 mg by mouth at bedtime.       Marland Kitchen omeprazole (PRILOSEC) 20 MG capsule Take 20 mg by mouth daily.      . ranolazine (RANEXA) 500 MG 12 hr tablet Take 500 mg by mouth 2 (two) times daily.      Marland Kitchen aspirin 81 MG tablet Take 81 mg by mouth daily.      . Multiple Vitamins-Minerals (MULTIVITAMINS THER. W/MINERALS) TABS Take 1 tablet by mouth daily.       . Omega 3 1200 MG CAPS Take 1 capsule by mouth 2 (two) times daily.        No results found for this or any previous visit (from the past 48 hour(s)). No results found.  Review of Systems  Constitutional: Negative.  Negative for fever and chills.  HENT: Negative.   Eyes: Negative.   Respiratory: Negative.   Cardiovascular: Negative.   Gastrointestinal: Negative.  Negative for nausea and vomiting.  Genitourinary: Negative.   Musculoskeletal: Negative.   Skin: Negative.   Neurological: Negative.   Endo/Heme/Allergies: Negative.   Psychiatric/Behavioral: Negative.     Blood pressure 131/51, pulse 72, temperature 97.4 F (36.3 C), temperature source Oral, resp. rate 18, height 5\' 9"  (1.753 m), weight 87.317 kg (192 lb 8 oz), SpO2 94.00%. Physical Exam  Constitutional: He is oriented to person, place, and time. He appears well-developed and well-nourished.  HENT:  Head: Normocephalic and atraumatic.  Eyes: Pupils are equal, round, and reactive to light.  Neck: Normal range of motion. Neck supple.  Cardiovascular: Normal rate and regular rhythm.   Respiratory: Effort normal and breath sounds normal.  GI: Soft. Bowel sounds are normal.  No scars  Genitourinary: Penis normal.  No CVAT  Musculoskeletal: Normal range of motion.  Neurological: He is alert and oriented to person, place, and time.  Skin: Skin is warm and dry.  Psychiatric: He has a normal mood and affect. His behavior is normal. Judgment and thought content normal.     Assessment/Plan    1 - Left Renal Pelvis Cancer -  We rediscussed the role  of radical nephroureterectomy with the overall goal of complete surgical excision (negative margins) and better staging / diagnosis. We specifically rediscussed that with removal of the kidney there would be an overall renal function decline with attendant risks of renal failure and need for dialysis in some cases, and need for kidney-friendly lifestyle post-op with excellent blood pressure and glycemic control. We then rediscussed surgical approaches including robotic and open techniques with robotic associated with a shorter convalescence. I showed the patient on their abdomen the approximately 4-6 incision (trocar) sites as well as  presumed extraction sites with robotic approach as well as possible open incision sites. We specifically readdressed that there may be need to alter operative plans according to intraopertive findings including conversion to open procedure. I also rementioned that sometimes repositioning and additional ports (incisions) are needed for adequate bladder cuff removal. We rediscussed specific peri-operative risks including bleeding, infection, deep vein thrombosis, pulmonary embolism, compartment syndrome, nuropathy / neuropraxia, heart attack, stroke, death, as well as long-term risks such as non-cure / need for additional therapy and need for imaging and lab based post-op surveillance protocols. We rediscussed typical hospital course of approximately 2 day hospitalization, need for peri-operative drains / catheters (foley for approx 10 days with bladder cuff excision), and typical post-hospital course with return to most non-strenuous activities by 2 weeks and ability to return to most jobs and more strenuous activity such as exercise by 6 weeks.   WIll plan for PT/OT eval post-op as he may need rehab / SNF as he lives alone.   2 - Bladder Cancer - NED by most recent cysto.    Vianne Grieshop 08/22/2013, 6:40 AM

## 2013-08-22 NOTE — Transfer of Care (Signed)
Immediate Anesthesia Transfer of Care Note  Patient: BOLESLAUS HOLLOWAY  Procedure(s) Performed: Procedure(s): ROBOT ASSITED LAPAROSCOPIC NEPHROURETERECTOMY (Left) CYSTOSCOPY WITH RETROGRADE PYELOGRAM/URETERAL STENT PLACEMENT (Left) LAPAROSCOPIC INGUINAL HERNIA (Left)  Patient Location: PACU  Anesthesia Type:General  Level of Consciousness: awake, alert , oriented and patient cooperative  Airway & Oxygen Therapy: Patient Spontanous Breathing and Patient connected to face mask oxygen  Post-op Assessment: Report given to PACU RN, Post -op Vital signs reviewed and stable and Patient moving all extremities  Post vital signs: Reviewed and stable  Complications: No apparent anesthesia complications

## 2013-08-22 NOTE — Anesthesia Preprocedure Evaluation (Addendum)
Anesthesia Evaluation  Patient identified by MRN, date of birth, ID band Patient awake    Reviewed: Allergy & Precautions, H&P , NPO status , Patient's Chart, lab work & pertinent test results  Airway Mallampati: III TM Distance: >3 FB Neck ROM: Full    Dental  (+) Edentulous Upper, Edentulous Lower   Pulmonary former smoker,  breath sounds clear to auscultation- rhonchi        Cardiovascular hypertension, + CAD and + Cardiac Stents + dysrhythmias (LBBB, Hx of afib) Rhythm:Regular Rate:Normal     Neuro/Psych negative neurological ROS  negative psych ROS   GI/Hepatic Neg liver ROS, hiatal hernia, GERD-  Medicated,  Endo/Other  negative endocrine ROS  Renal/GU Renal InsufficiencyRenal disease (Left renal pelvis cancer)  negative genitourinary   Musculoskeletal negative musculoskeletal ROS (+)   Abdominal   Peds  Hematology negative hematology ROS (+)   Anesthesia Other Findings   Reproductive/Obstetrics negative OB ROS                         Anesthesia Physical Anesthesia Plan  ASA: III  Anesthesia Plan: General   Post-op Pain Management:    Induction: Intravenous  Airway Management Planned: Oral ETT  Additional Equipment: None  Intra-op Plan:   Post-operative Plan: Extubation in OR  Informed Consent: I have reviewed the patients History and Physical, chart, labs and discussed the procedure including the risks, benefits and alternatives for the proposed anesthesia with the patient or authorized representative who has indicated his/her understanding and acceptance.   Dental advisory given  Plan Discussed with: CRNA and Surgeon  Anesthesia Plan Comments:         Anesthesia Quick Evaluation

## 2013-08-22 NOTE — Progress Notes (Signed)
Hgb. And Hct. Results noted.

## 2013-08-23 LAB — BASIC METABOLIC PANEL
Anion gap: 10 (ref 5–15)
BUN: 22 mg/dL (ref 6–23)
CO2: 29 mEq/L (ref 19–32)
Calcium: 8.3 mg/dL — ABNORMAL LOW (ref 8.4–10.5)
Chloride: 101 mEq/L (ref 96–112)
Creatinine, Ser: 2.18 mg/dL — ABNORMAL HIGH (ref 0.50–1.35)
GFR calc Af Amer: 30 mL/min — ABNORMAL LOW (ref >90)
GFR, EST NON AFRICAN AMERICAN: 26 mL/min — AB (ref >90)
Glucose, Bld: 161 mg/dL — ABNORMAL HIGH (ref 70–99)
Potassium: 3.8 mEq/L (ref 3.7–5.3)
SODIUM: 140 meq/L (ref 137–147)

## 2013-08-23 LAB — CBC
HEMATOCRIT: 33.3 % — AB (ref 39.0–52.0)
HEMOGLOBIN: 10.9 g/dL — AB (ref 13.0–17.0)
MCH: 31.7 pg (ref 26.0–34.0)
MCHC: 32.7 g/dL (ref 30.0–36.0)
MCV: 96.8 fL (ref 78.0–100.0)
Platelets: 157 10*3/uL (ref 150–400)
RBC: 3.44 MIL/uL — ABNORMAL LOW (ref 4.22–5.81)
RDW: 13.9 % (ref 11.5–15.5)
WBC: 7.2 10*3/uL (ref 4.0–10.5)

## 2013-08-23 MED ORDER — RANOLAZINE ER 500 MG PO TB12
500.0000 mg | ORAL_TABLET | Freq: Every day | ORAL | Status: DC
  Administered 2013-08-24 – 2013-08-26 (×3): 500 mg via ORAL
  Filled 2013-08-23 (×3): qty 1

## 2013-08-23 NOTE — Progress Notes (Addendum)
Patient ID: Jacob [name], male   DOB: 05/08/1928, 80 y.o.   MRN: [id number] 1 Day Post-Op  Subjective: SP left robotic NU.   He is doing well but is having some bladder spasms.   He has no nausea but has no flatus.  His Cr is up to 2.37 which is not surprising.   He has good UOP.  ROS:  Review of Systems  Constitutional: Negative for fever and chills.  Respiratory: Negative for shortness of breath.   Cardiovascular: Negative for chest pain.  Gastrointestinal: Positive for abdominal pain (that are consistent with bladder spasms. ). Negative for nausea.    Anti-infectives: Anti-infectives   Start     Dose/Rate Route Frequency Ordered Stop   08/22/13 1600  clindamycin (CLEOCIN) IVPB 600 mg     600 mg 100 mL/hr over 30 Minutes Intravenous 3 times per day 08/22/13 1332 08/22/13 2320   08/22/13 0600  gentamicin (GARAMYCIN) 390 mg in dextrose 5 % 100 mL IVPB     390 mg 109.8 mL/hr over 60 Minutes Intravenous 30 min pre-op 08/21/13 1432 08/22/13 0813   08/22/13 0536  clindamycin (CLEOCIN) IVPB 900 mg     900 mg 100 mL/hr over 30 Minutes Intravenous 30 min pre-op 08/22/13 0536 08/22/13 0803      Current Facility-Administered Medications  Medication Dose Route Frequency Provider Last Rate Last Dose  . acetaminophen (TYLENOL) tablet 650 mg  650 mg Oral Q4H PRN Margo Aye, MD      . aspirin EC tablet 81 mg  81 mg Oral Daily Margo Aye, MD      . dextrose 5 % in lactated ringers infusion   Intravenous Continuous Margo Aye, MD 125 mL/hr at 08/22/13 2250    . diphenhydrAMINE (BENADRYL) injection 12.5 mg  12.5 mg Intravenous Q6H PRN Margo Aye, MD       Or  . diphenhydrAMINE (BENADRYL) 12.5 MG/5ML elixir 12.5 mg  12.5 mg Oral Q6H PRN Margo Aye, MD      . docusate sodium (COLACE) capsule 100 mg  100 mg Oral BID Margo Aye, MD   100 mg at 08/23/13 1036  . fentaNYL (SUBLIMAZE) injection 25-50 mcg  25-50 mcg Intravenous Q1H PRN Margo Aye, MD   25 mcg  at 08/23/13 0541  . hydrochlorothiazide (HYDRODIURIL) tablet 25 mg  25 mg Oral Daily Margo Aye, MD      . HYDROcodone-acetaminophen (NORCO/VICODIN) 5-325 MG per tablet 1-2 tablet  1-2 tablet Oral Q4H PRN Margo Aye, MD   1 tablet at 08/23/13 1045  . isosorbide mononitrate (IMDUR) 24 hr tablet 30 mg  30 mg Oral QHS Margo Aye, MD   30 mg at 08/22/13 2250  . latanoprost (XALATAN) 0.005 % ophthalmic solution 1 drop  1 drop Both Eyes QHS Margo Aye, MD   1 drop at 08/22/13 2250  . loratadine (CLARITIN) tablet 10 mg  10 mg Oral Daily Margo Aye, MD   10 mg at 08/23/13 1036  . ondansetron (ZOFRAN) injection 4 mg  4 mg Intravenous Q4H PRN Margo Aye, MD      . oxybutynin [facility]) tablet 5 mg  5 mg Oral Q8H PRN Margo Aye, MD      . pantoprazole (PROTONIX) EC tablet 40 mg  40 mg Oral Daily Margo Aye, MD   40 mg at 08/23/13 1036  . [START ON 08/24/2013] ranolazine (RANEXA) 12 hr tablet 500 mg  500  mg Oral Daily Malka So, MD      . [facility]) tablet 8.6 mg  1 tablet Oral BID Margo Aye, MD   8.6 mg at 08/23/13 1045  . simvastatin (ZOCOR) tablet 10 mg  10 mg Oral q1800 Margo Aye, MD       Facility-Administered Medications Ordered in Other Encounters  Medication Dose Route Frequency Provider Last Rate Last Dose  . mitomycin (MUTAMYCIN) chemo injection 40 mg  40 mg Bladder Instillation Once [name], MD      . mitomycin [facility]) chemo injection 40 mg  40 mg Bladder Instillation Once [name], MD         Objective: Vital signs in last 24 hours: Temp:  [97.5 F (36.4 C)-99.1 F (37.3 C)] 99.1 F (37.3 C) (08/22 0545) Pulse Rate:  [67-78] 76 (08/22 1032) Resp:  [12-24] 24 (08/22 0545) BP: (118-148)/(40-54) 128/44 mmHg (08/22 1032) SpO2:  [94 %-100 %] 94 % (08/22 0545) FiO2 (%):  [2 %] 2 % (08/21 1400) Weight:  [87.317 kg (192 lb 8 oz)] 87.317 kg (192 lb 8 oz) (08/21 1400)  Intake/Output from previous  day: 08/21 0701 - 08/22 0700 In: 4781.3 [I.V.:4781.3] Out: 84 [Urine:700; Drains:75; Blood:150] Intake/Output this shift:     Physical Exam  Constitutional: He is well-developed, well-nourished, and in no distress.  Cardiovascular: Normal rate and regular rhythm.   Pulmonary/Chest: Effort normal and breath sounds normal. No respiratory distress.  Abdominal: Soft. There is no tenderness.  Incisions intact.  BS absent.  Musculoskeletal: Normal range of motion. He exhibits no edema.  Skin: Skin is warm and dry.    Lab Results:   Recent Labs  08/22/13 1201 08/23/13 0413  WBC  --  7.2  HGB 11.8* 10.9*  HCT 35.2* 33.3*  PLT  --  157   BMET  Recent Labs  08/22/13 1201 08/23/13 0413  NA 142 140  K 3.8 3.8  CL 103 101  CO2 29 29  GLUCOSE 170* 161*  BUN 21 22  CREATININE 1.47* 2.18*  CALCIUM 8.4 8.3*   PT/INR No results found for this basename: LABPROT, INR,  in the last 72 hours ABG No results found for this basename: PHART, PCO2, PO2, HCO3,  in the last 72 hours  Studies/Results: Dg Retrograde Pyelogram  08/22/2013   CLINICAL DATA:  Cancer of the left renal pelvis.  EXAM: RETROGRADE PYELOGRAM  COMPARISON:  CT scan dated 05/27/2013  FINDINGS: Left retrograde pyelogram demonstrates a mass obstructing the left upper pole infundibulum. The rest of the left renal collecting system appears normal including the entire left ureter.  IMPRESSION: Mass obstructing the upper pole infundibulum of the left kidney.   Electronically Signed   By: Rozetta Nunnery M.D.   On: 08/22/2013 08:44   Labs are reviewed.   Pharmacy comment reviewed re: Ranexa.   Assessment: s/p Procedure(s): ROBOT ASSITED LAPAROSCOPIC NEPHROURETERECTOMY CYSTOSCOPY WITH RETROGRADE PYELOGRAM/URETERAL STENT PLACEMENT LAPAROSCOPIC INGUINAL HERNIA  He is doing well post op but has a bump in the Cr. His JP drainage is declining.  He has some bladder spasms.  Plan: Labs in am.  I will reduce the dose of  ranexa. He has oxybutynin for bladder spasms.      LOS: 1 day    Giavonni Fonder J 08/23/2013

## 2013-08-24 MED ORDER — HYOSCYAMINE SULFATE 0.125 MG SL SUBL
0.1250 mg | SUBLINGUAL_TABLET | SUBLINGUAL | Status: DC | PRN
  Administered 2013-08-24 (×3): 0.125 mg via SUBLINGUAL
  Filled 2013-08-24 (×3): qty 1

## 2013-08-24 MED ORDER — TRAMADOL HCL 50 MG PO TABS
50.0000 mg | ORAL_TABLET | Freq: Two times a day (BID) | ORAL | Status: DC | PRN
  Administered 2013-08-24: 50 mg via ORAL
  Filled 2013-08-24: qty 1

## 2013-08-24 NOTE — Progress Notes (Signed)
PT Cancellation Note  Patient Details Name: Jacob [name] MRN: [id number] DOB: 1928/03/25   Cancelled Treatment:    Reason Eval/Treat Not Completed:  (in AM had ambulated with RN. Currently resting per RN. check back 8/24)   Claretha Cooper 08/24/2013, 4:58 PM

## 2013-08-24 NOTE — Progress Notes (Signed)
Patient ID: Jacob [name], male   DOB: 07/16/1928, 80 y.o.   MRN: [id number] 2 Days Post-Op  Subjective: Jacob [name] is doing well.  He has mod pain but is reluctant to take the vicodin because of hallucinations last night.   He continues to have some bladder spasms.  He has excellent UOP and is tolerating clears.  He remains on supplemental O2 but his nurse has walked him to the end of the hall and back.   ROS:  Review of Systems  Constitutional: Negative for fever.  Gastrointestinal: Positive for abdominal pain (bladder spasms that are partially controlled with oxybutynin. ).  Psychiatric/Behavioral: Positive for hallucinations (with the vicodin at night).    Anti-infectives: Anti-infectives   Start     Dose/Rate Route Frequency Ordered Stop   08/22/13 1600  clindamycin (CLEOCIN) IVPB 600 mg     600 mg 100 mL/hr over 30 Minutes Intravenous 3 times per day 08/22/13 1332 08/22/13 2320   08/22/13 0600  gentamicin (GARAMYCIN) 390 mg in dextrose 5 % 100 mL IVPB     390 mg 109.8 mL/hr over 60 Minutes Intravenous 30 min pre-op 08/21/13 1432 08/22/13 0813   08/22/13 0536  clindamycin (CLEOCIN) IVPB 900 mg     900 mg 100 mL/hr over 30 Minutes Intravenous 30 min pre-op 08/22/13 0536 08/22/13 0803      Current Facility-Administered Medications  Medication Dose Route Frequency Provider Last Rate Last Dose  . acetaminophen (TYLENOL) tablet 650 mg  650 mg Oral Q4H PRN Margo Aye, MD      . aspirin EC tablet 81 mg  81 mg Oral Daily Margo Aye, MD   81 mg at 08/23/13 1247  . dextrose 5 % in lactated ringers infusion   Intravenous Continuous Margo Aye, MD 125 mL/hr at 08/23/13 2354    . diphenhydrAMINE (BENADRYL) injection 12.5 mg  12.5 mg Intravenous Q6H PRN Margo Aye, MD       Or  . diphenhydrAMINE (BENADRYL) 12.5 MG/5ML elixir 12.5 mg  12.5 mg Oral Q6H PRN Margo Aye, MD      . docusate sodium (COLACE) capsule 100 mg  100 mg Oral BID Margo Aye, MD   100  mg at 08/23/13 2112  . fentaNYL (SUBLIMAZE) injection 25-50 mcg  25-50 mcg Intravenous Q1H PRN Margo Aye, MD   25 mcg at 08/23/13 0541  . hydrochlorothiazide (HYDRODIURIL) tablet 25 mg  25 mg Oral Daily Margo Aye, MD      . HYDROcodone-acetaminophen (NORCO/VICODIN) 5-325 MG per tablet 1-2 tablet  1-2 tablet Oral Q4H PRN Margo Aye, MD   1 tablet at 08/24/13 0255  . hyoscyamine (LEVSIN SL) SL tablet 0.125 mg  0.125 mg Sublingual Q4H PRN Malka So, MD      . isosorbide mononitrate (IMDUR) 24 hr tablet 30 mg  30 mg Oral QHS Margo Aye, MD   30 mg at 08/23/13 2112  . latanoprost (XALATAN) 0.005 % ophthalmic solution 1 drop  1 drop Both Eyes QHS Margo Aye, MD   1 drop at 08/23/13 2114  . loratadine (CLARITIN) tablet 10 mg  10 mg Oral Daily Margo Aye, MD   10 mg at 08/23/13 1036  . ondansetron (ZOFRAN) injection 4 mg  4 mg Intravenous Q4H PRN Margo Aye, MD      . oxybutynin [facility]) tablet 5 mg  5 mg Oral Q8H PRN Margo Aye, MD   5 mg at  08/24/13 3875  . pantoprazole (PROTONIX) EC tablet 40 mg  40 mg Oral Daily Margo Aye, MD   40 mg at 08/23/13 1036  . ranolazine (RANEXA) 12 hr tablet 500 mg  500 mg Oral Daily Malka So, MD      . senna [facility]) tablet 8.6 mg  1 tablet Oral BID Margo Aye, MD   8.6 mg at 08/23/13 2112  . simvastatin (ZOCOR) tablet 10 mg  10 mg Oral q1800 Margo Aye, MD   10 mg at 08/23/13 1804  . traMADol (ULTRAM) tablet 50 mg  50 mg Oral Q12H PRN Malka So, MD       Facility-Administered Medications Ordered in Other Encounters  Medication Dose Route Frequency Provider Last Rate Last Dose  . mitomycin (MUTAMYCIN) chemo injection 40 mg  40 mg Bladder Instillation Once [name], MD      . mitomycin [facility]) chemo injection 40 mg  40 mg Bladder Instillation Once [name], MD         Objective: Vital signs in last 24 hours: Temp:  [98.1 F (36.7 C)-99 F (37.2 C)] 99 F  (37.2 C) (08/23 0500) Pulse Rate:  [68-76] 68 (08/23 0500) Resp:  [18-20] 20 (08/23 0500) BP: (128-145)/(41-57) 130/50 mmHg (08/23 0500) SpO2:  [88 %-100 %] 95 % (08/23 0805)  Intake/Output from previous day: 08/22 0701 - 08/23 0700 In: 4357.9 [P.O.:360; I.V.:3997.9] Out: 2090 [Urine:2020; Drains:70] Intake/Output this shift: Total I/O In: -  Out: 10 [Drains:10]   Physical Exam  Constitutional: He is well-developed, well-nourished, and in no distress.  Cardiovascular: Normal rate and regular rhythm.   Pulmonary/Chest: Effort normal and breath sounds normal. He has no wheezes.  Abdominal: Soft. Bowel sounds are normal.  Incisions intact but with some bruising.   JP with minimal serous drainage.   Musculoskeletal: Normal range of motion. He exhibits no edema and no tenderness.    Lab Results:   Recent Labs  08/22/13 1201 08/23/13 0413  WBC  --  7.2  HGB 11.8* 10.9*  HCT 35.2* 33.3*  PLT  --  157   BMET  Recent Labs  08/22/13 1201 08/23/13 0413  NA 142 140  K 3.8 3.8  CL 103 101  CO2 29 29  GLUCOSE 170* 161*  BUN 21 22  CREATININE 1.47* 2.18*  CALCIUM 8.4 8.3*   PT/INR No results found for this basename: LABPROT, INR,  in the last 72 hours ABG No results found for this basename: PHART, PCO2, PO2, HCO3,  in the last 72 hours  Studies/Results: No results found.   Assessment: s/p Procedure(s): ROBOT ASSITED LAPAROSCOPIC NEPHROURETERECTOMY CYSTOSCOPY WITH RETROGRADE PYELOGRAM/URETERAL STENT PLACEMENT LAPAROSCOPIC INGUINAL HERNIA  He is doing well but needs a different pain med. He has minimal JP drainage.   Plan: I will change him to tramadol and also see if levsin sl controls his spasms better. Advance diet. D/C JP. Hep lock IV.  Continue ambulation.   PT order is in but he hasn't been seen yet.      LOS: 2 days    Malka So 08/24/2013

## 2013-08-25 ENCOUNTER — Encounter (HOSPITAL_COMMUNITY): Payer: Self-pay | Admitting: Urology

## 2013-08-25 ENCOUNTER — Inpatient Hospital Stay (HOSPITAL_COMMUNITY): Payer: Medicare Other

## 2013-08-25 LAB — BASIC METABOLIC PANEL
Anion gap: 9 (ref 5–15)
BUN: 19 mg/dL (ref 6–23)
CALCIUM: 8.6 mg/dL (ref 8.4–10.5)
CO2: 27 meq/L (ref 19–32)
CREATININE: 2.34 mg/dL — AB (ref 0.50–1.35)
Chloride: 100 mEq/L (ref 96–112)
GFR calc Af Amer: 28 mL/min — ABNORMAL LOW (ref >90)
GFR calc non Af Amer: 24 mL/min — ABNORMAL LOW (ref >90)
GLUCOSE: 127 mg/dL — AB (ref 70–99)
Potassium: 4.1 mEq/L (ref 3.7–5.3)
Sodium: 136 mEq/L — ABNORMAL LOW (ref 137–147)

## 2013-08-25 MED ORDER — FUROSEMIDE 10 MG/ML IJ SOLN
20.0000 mg | Freq: Once | INTRAMUSCULAR | Status: AC
  Administered 2013-08-25: 20 mg via INTRAVENOUS
  Filled 2013-08-25: qty 2

## 2013-08-25 NOTE — Evaluation (Signed)
Physical Therapy Evaluation Patient Details Name: QAADIR KENT MRN: [id number] DOB: [birth year] Today's Date: 08/25/2013   History of Present Illness  Pt is an 80 year old male diagnosed with Left Renal Pelvis Cancer s/p left robotic nephro-ureterectomy for large volume left renal pelvis cancer on 08/22/2013.  Clinical Impression  Pt currently with functional limitations due to the deficits listed below (see PT Problem List).  Pt will benefit from skilled PT to increase their independence and safety with mobility to allow discharge to the venue listed below.  Pt lives alone at home and usually does not need assistive device.  Pt reports he does not cook and typically drives to get meals every day.  Pt agreeable to SNF prior to d/c home.  If d/c home, would need meals provided, benefit from [facility] aide and require RW.  Pt fatigues quickly with exertion and requires supplemental oxygen at this time.  SATURATION QUALIFICATIONS: (This note is used to comply with regulatory documentation for home oxygen)  Patient Saturations on Room Air at Rest = 97%  Patient Saturations on Room Air while Ambulating = 84%  Patient Saturations on 2 Liters of oxygen while Ambulating = 92%  Please briefly explain why patient needs home oxygen: to maintain saturations above 88% during activity and ambulation.     Follow Up Recommendations SNF    Equipment Recommendations  Rolling walker with 5" wheels    Recommendations for Other Services       Precautions / Restrictions Precautions Precautions: Fall Precaution Comments: monitor sats Restrictions Weight Bearing Restrictions: No      Mobility  Bed Mobility               General bed mobility comments: pt up in recliner on arrival  Transfers Overall transfer level: Needs assistance Equipment used: Rolling walker (2 wheeled) Transfers: Sit to/from Stand Sit to Stand: Min guard         General transfer comment: verbal cues for safe  technique including hand placement  Ambulation/Gait Ambulation/Gait assistance: Min guard Ambulation Distance (Feet): 320 Feet Assistive device: Rolling walker (2 wheeled) Gait Pattern/deviations: Step-through pattern;Decreased stride length Gait velocity: decr   General Gait Details: verbal cues for use of RW, pt reports 3/4 dyspnea and took rest breaks as needed for pursed lip breathing, required 2L O2 [place] for SpO2 92% during ambulation  Stairs            Wheelchair Mobility    Modified Rankin (Stroke Patients Only)       Balance                                             Pertinent Vitals/Pain Pain Assessment: No/denies pain    Home Living Family/patient expects to be discharged to:: Private residence Living Arrangements: Alone   Type of Home: House Home Access: Stairs to enter     Home Layout: One level Home Equipment: None      Prior Function Level of Independence: Independent         Comments: typically drives for meals     Hand Dominance        Extremity/Trunk Assessment               Lower Extremity Assessment: Generalized weakness      Cervical / Trunk Assessment: Normal  Communication   Communication: No difficulties  Cognition Arousal/Alertness: Awake/alert  Behavior During Therapy: WFL for tasks assessed/performed Overall Cognitive Status: Within Functional Limits for tasks assessed                      General Comments      Exercises        Assessment/Plan    PT Assessment Patient needs continued PT services  PT Diagnosis Difficulty walking   PT Problem List Decreased strength;Decreased activity tolerance;Decreased mobility;Cardiopulmonary status limiting activity;Decreased knowledge of use of DME  PT Treatment Interventions DME instruction;Gait training;Functional mobility training;Patient/family education;Therapeutic activities;Therapeutic exercise   PT Goals (Current goals can be  found in the Care Plan section) Acute Rehab PT Goals PT Goal Formulation: With patient Time For Goal Achievement: 09/01/13 Potential to Achieve Goals: Good    Frequency Min 3X/week   Barriers to discharge        Co-evaluation               End of Session Equipment Utilized During Treatment: Oxygen Activity Tolerance: Patient limited by fatigue Patient left: in chair;with call bell/phone within reach           Time: 0901-0924 PT Time Calculation (min): 23 min   Charges:   PT Evaluation $Initial PT Evaluation Tier I: 1 Procedure PT Treatments $Gait Training: 23-37 mins   PT G Codes:          Shalita Notte,KATHrine E 08/25/2013, 10:14 AM Carmelia Bake, PT, DPT 08/25/2013 Pager: [phone]

## 2013-08-25 NOTE — Progress Notes (Signed)
Clinical Social Work Department CLINICAL SOCIAL WORK PLACEMENT NOTE 08/25/2013  Patient:  ALEXANDAR, WEISENBERGER  Account Number:  [id number] Admit date:  08/22/2013  Clinical Social Worker:  Renold Genta  Date/time:  08/25/2013 04:52 PM  Clinical Social Work is seeking post-discharge placement for this patient at the following level of care:   SKILLED NURSING   (*CSW will update this form in Epic as items are completed)   08/25/2013  Patient/family provided with [place] Department of Clinical Social Work's list of facilities offering this level of care within the geographic area requested by the patient (or if unable, by the patient's family).  08/25/2013  Patient/family informed of their freedom to choose among providers that offer the needed level of care, that participate in Medicare, Medicaid or managed care program needed by the patient, have an available bed and are willing to accept the patient.  08/25/2013  Patient/family informed of [facility]' ownership interest in [facility], as well as of the fact that they are under no obligation to receive care at this facility.  PASARR submitted to EDS on 08/25/2013 PASARR number received on 08/25/2013  FL2 transmitted to all facilities in geographic area requested by pt/family on  08/25/2013 FL2 transmitted to all facilities within larger geographic area on   Patient informed that his/her managed care company has contracts with or will negotiate with  certain facilities, including the following:     Patient/family informed of bed offers received:  08/25/2013 Patient chooses bed at  Physician recommends and patient chooses bed at    Patient to be transferred to  on   Patient to be transferred to facility by  Patient and family notified of transfer on  Name of family member notified:    The following physician request were entered in Epic:   Additional Comments:   [name], [place] Social Worker cell #: [phone]

## 2013-08-25 NOTE — Care Management Note (Signed)
CARE MANAGEMENT NOTE 08/25/2013  Patient:  Jacob [name], Jacob [name]   Account Number:  [id number]  Date Initiated:  08/25/2013  Documentation initiated by:  Leafy Kindle  Subjective/Objective Assessment:   80 yo pt admitted with Urothelial Ca of kidney.     Action/Plan:   From home alone.  Pt recommending SNF.   Anticipated DC Date:  08/27/2013   Anticipated DC Plan:  SKILLED NURSING FACILITY  In-house referral  Clinical Social Worker      DC Planning Services  CM consult      Choice offered to / List presented to:             Status of service:  In process, will continue to follow Medicare Important Message given?   (If response is "NO", the following Medicare IM given date fields will be blank) Date Medicare IM given:   Medicare IM given by:   Date Additional Medicare IM given:  08/25/2013 Additional Medicare IM given by:  [facility]  Discharge Disposition:    Per UR Regulation:  Reviewed for med. necessity/level of care/duration of stay  If discussed at [place] of Stay Meetings, dates discussed:    Comments:  08/25/13 Marney Doctor RN,BSN,NCM SW alerted to Pt recommendation of SNF placement.  CM will continue to follow for DC needs.

## 2013-08-25 NOTE — Evaluation (Signed)
Occupational Therapy Evaluation Patient Details Name: Jacob [name] MRN: [id number] DOB: [birth year] Today's Date: 08/25/2013    History of Present Illness Pt is an 80 year old male diagnosed with Left Renal Pelvis Cancer s/p left robotic nephro-ureterectomy for large volume left renal pelvis cancer on 08/22/2013.   Clinical Impression   Pt presents to OT with decreased I with ADL activity due to problems listed below. Pt will benefit from skilled OT to increase I with ADL activity and return to PLOF    Follow Up Recommendations  SNF    Equipment Recommendations  None recommended by OT           Mobility Bed Mobility Overal bed mobility: Needs Assistance Bed Mobility: Supine to Sit     Supine to sit: Min assist        Transfers Overall transfer level: Needs assistance Equipment used: 1 person hand held assist Transfers: Sit to/from Stand Sit to Stand: Min assist         General transfer comment: verbal cues for safe technique including hand placement         ADL Overall ADL's : Needs assistance/impaired Eating/Feeding: Set up;Sitting   Grooming: Standing;Minimal assistance Grooming Details (indicate cue type and reason): pt fatigued quickly! Upper Body Bathing: Minimal assitance;Sitting   Lower Body Bathing: Maximal assistance;Sit to/from stand   Upper Body Dressing : Minimal assistance;Sitting   Lower Body Dressing: Sit to/from stand;Maximal assistance   Toilet Transfer: Minimal assistance   Toileting- Clothing Manipulation and Hygiene: Minimal assistance       Functional mobility during ADLs: Minimal assistance General ADL Comments: pt fatigues very quickly.  OT did encourage pt to get OOB and stay up for a few hours.  Upon sitting pt able to cough up some phlegm.                Extremity/Trunk Assessment Upper Extremity Assessment Upper Extremity Assessment: Generalized weakness           Communication  Communication Communication: No difficulties   Cognition Arousal/Alertness: Awake/alert Behavior During Therapy: WFL for tasks assessed/performed Overall Cognitive Status: Within Functional Limits for tasks assessed                     General Comments   pt very agreeable to SNF for rehab            Home Living Family/patient expects to be discharged to:: Private residence Living Arrangements: Alone   Type of Home: House Home Access: Stairs to enter     Home Layout: One level               Home Equipment: None          Prior Functioning/Environment Level of Independence: Independent        Comments: typically drives for meals    OT Diagnosis: Generalized weakness   OT Problem List: Decreased strength;Decreased activity tolerance   OT Treatment/Interventions: Self-care/ADL training;Patient/family education;DME and/or AE instruction;Energy conservation    OT Goals(Current goals can be found in the care plan section) Acute Rehab OT Goals Patient Stated Goal: get back to going out to eat wiht my friends OT Goal Formulation: With patient Time For Goal Achievement: 10/09/13 Potential to Achieve Goals: Good  OT Frequency: Min 2X/week           End of Session Nurse Communication: Mobility status  Activity Tolerance: Patient limited by fatigue Patient left: in chair;with call bell/phone within reach;with family/visitor present  Time: 1530-1600 OT Time Calculation (min): 30 min Charges:  OT General Charges $OT Visit: 1 Procedure OT Evaluation $Initial OT Evaluation Tier I: 1 Procedure OT Treatments $Self Care/Home Management : 8-22 mins G-Codes:    Betsy Pries 29-Aug-2013, 4:13 PM

## 2013-08-25 NOTE — Progress Notes (Signed)
Clinical Social Work Department BRIEF PSYCHOSOCIAL ASSESSMENT 08/25/2013  Patient:  Jacob [name], Jacob [name]     Account Number:  [id number]     Admit date:  08/22/2013  Clinical Social Worker:  Renold Genta  Date/Time:  08/25/2013 04:42 PM  Referred by:  Physician  Date Referred:  08/25/2013 Referred for  SNF Placement   Other Referral:   Interview type:  Patient Other interview type:   and family at bedside    PSYCHOSOCIAL DATA Living Status:  ALONE Admitted from facility:   Level of care:   Primary support name:  Jayren Cease (brother) c#: [phone] h#: [phone] Primary support relationship to patient:  SIBLING Degree of support available:   good    CURRENT CONCERNS Current Concerns  Post-Acute Placement   Other Concerns:    SOCIAL WORK ASSESSMENT / PLAN CSW received consult for SNF placement, reviewed PT evaluation recommending SNF at discharge as well.   Assessment/plan status:  Information/Referral to [employer] Other assessment/ plan:   Information/referral to community resources:   CSW completed FL2 and faxed information out to [facility] SNFs - awaiting response from patient's first choice, [facility].    PATIENT'S/FAMILY'S RESPONSE TO PLAN OF CARE: Patient is agreeable with plan for SNF at discharge, as he lives alone and does not feel like he is able to return home at this time. Patient states that [facility] is very close to his home and that he is on the waiting list for their Independent Living. CSW faxed information over to [facility] - awaiting response.       [name], [facility] Clinical Social Worker cell #: [phone]

## 2013-08-25 NOTE — Progress Notes (Signed)
3 Days Post-Op  Subjective:  1 - Left Renal Pelvis Cancer - s/p left robotic nephro-ureterectomy for large volume left renal pelvis cancer 08/22/2013. Path pending.   2 - Renal Insufficiency - s/p nephrectomy as per above. Cr 2-2.5 range post-op with preserved UOP and normal electrolytes.   3 - Disposition - Pt lives alone. Ambulatory pre-op.   Today Gautam is seen to proceed with left robotic nephroureterectomy for his left renal pelvis cancer. Recent Cr 1.05 2015. His PCP is Dr. Vedia Coffer in [place].   Objective: Vital signs in last 24 hours: Temp:  [98.2 F (36.8 C)-98.8 F (37.1 C)] 98.8 F (37.1 C) (08/24 0559) Pulse Rate:  [58-79] 67 (08/24 0559) Resp:  [18-20] 20 (08/24 0559) BP: (119-135)/(41-55) 131/41 mmHg (08/24 0559) SpO2:  [88 %-98 %] 98 % (08/24 0559) Last BM Date: 08/21/13  Intake/Output from previous day: 08/23 0701 - 08/24 0700 In: 21 [P.O.:720] Out: 50 [Urine:870; Drains:20] Intake/Output this shift:    General appearance: alert, cooperative and appears stated age Head: Normocephalic, without obvious abnormality, atraumatic Throat: lips, mucosa, and tongue normal; teeth and gums normal Neck: supple, symmetrical, trachea midline Back: symmetric, no curvature. ROM normal. No CVA tenderness. Resp: non-labored Cardio: Nl rate. GI: soft, non-tender; bowel sounds normal; no masses,  no organomegaly Male genitalia: normal, foley c/d/i with clear yellow urine. Extremities: extremities normal, atraumatic, no cyanosis or edema Skin: Skin color, texture, turgor normal. No rashes or lesions Lymph nodes: Cervical, supraclavicular, and axillary nodes normal. Neurologic: Grossly normal Incision/Wound: recent port sites and extraction sites c/d/i. Some stable ecchymoses w/o hematoma.   Lab Results:   Recent Labs  08/22/13 1201 08/23/13 0413  WBC  --  7.2  HGB 11.8* 10.9*  HCT 35.2* 33.3*  PLT  --  157   BMET  Recent Labs  08/23/13 0413 08/25/13 0405   NA 140 136*  K 3.8 4.1  CL 101 100  CO2 29 27  GLUCOSE 161* 127*  BUN 22 19  CREATININE 2.18* 2.34*  CALCIUM 8.3* 8.6   PT/INR No results found for this basename: LABPROT, INR,  in the last 72 hours ABG No results found for this basename: PHART, PCO2, PO2, HCO3,  in the last 72 hours  Studies/Results: No results found.  Anti-infectives: Anti-infectives   Start     Dose/Rate Route Frequency Ordered Stop   08/22/13 1600  clindamycin (CLEOCIN) IVPB 600 mg     600 mg 100 mL/hr over 30 Minutes Intravenous 3 times per day 08/22/13 1332 08/22/13 2320   08/22/13 0600  gentamicin (GARAMYCIN) 390 mg in dextrose 5 % 100 mL IVPB     390 mg 109.8 mL/hr over 60 Minutes Intravenous 30 min pre-op 08/21/13 1432 08/22/13 0813   08/22/13 0536  clindamycin (CLEOCIN) IVPB 900 mg     900 mg 100 mL/hr over 30 Minutes Intravenous 30 min pre-op 08/22/13 0536 08/22/13 0803      Assessment/Plan:  1 - Left Renal Pelvis Cancer - path pending. Advance diet. Hgb stable. He will go home with catheter.   2 - Renal Insufficiency - GFR stable. Saline lock .   3 - Disposition - PT/OT eval today. Very low threshold for rehab admission or [facility] or similar as he no at home s/p major surgery in elderly man. He will require foley for home.   [facility], Chaunce Winkels 08/25/2013

## 2013-08-26 ENCOUNTER — Encounter: Payer: Self-pay | Admitting: Internal Medicine

## 2013-08-26 MED ORDER — CIPROFLOXACIN HCL 250 MG PO TABS: 250.0000 mg | ORAL_TABLET | Freq: Two times a day (BID) | ORAL | Status: DC

## 2013-08-26 MED ORDER — TRAMADOL HCL 50 MG PO TABS: 50.0000 mg | ORAL_TABLET | Freq: Four times a day (QID) | ORAL | Status: DC | PRN

## 2013-08-26 MED ORDER — SENNOSIDES-DOCUSATE SODIUM 8.6-50 MG PO TABS: 1.0000 | ORAL_TABLET | Freq: Two times a day (BID) | ORAL | Status: DC

## 2013-08-26 NOTE — Discharge Instructions (Signed)

## 2013-08-26 NOTE — Progress Notes (Signed)
Pt with ambulation on room air remains moderately dyspneic with activity up in room and in hallway. O2 sats with hallway ambulation on room air 85-86%. No other acute changes noted with Pt's assessment. Discussed with Dr. Carrie Mew via phone and Pt to travel to rehab facility on oxygen and in an ambulance. Discussed with Pt and family with social worker in the room.

## 2013-08-26 NOTE — Care Management Note (Signed)
    Page 1 of 1   08/26/2013     11:41:58 AM CARE MANAGEMENT NOTE 08/26/2013  Patient:  Jacob [name], Jacob [name]   Account Number:  [id number]  Date Initiated:  08/25/2013  Documentation initiated by:  Jacob [name]  Subjective/Objective Assessment:   80 yo pt admitted with Urothelial Ca of kidney.     Action/Plan:   From home alone.  Pt recommending SNF.   Anticipated DC Date:  08/26/2013   Anticipated DC Plan:  SKILLED NURSING FACILITY  In-house referral  Clinical Social Worker      DC Planning Services  CM consult      Choice offered to / List presented to:             Status of service:  In process, will continue to follow Medicare Important Message given?   (If response is "NO", the following Medicare IM given date fields will be blank) Date Medicare IM given:   Medicare IM given by:   Date Additional Medicare IM given:  08/25/2013 Additional Medicare IM given by:  Jacob [name]  Discharge Disposition:  [facility]  Per UR Regulation:  Reviewed for med. necessity/level of care/duration of stay  If discussed at [place] of Stay Meetings, dates discussed:    Comments:  08/26/13 Jacob Rawling RN,BSN NCM [phone] PT-SNF.02 SATS LOW,NEEDING 02.PATIENT UNABLE TO RECEIVE HOME 02 WHILE GOING TO A HIGHER LEVEL @ D/C.TC [facility] DME REP Jacob [name]-UNABLE TO PROVIDE HOME 02 SINCE NOT THEIR PATIENT,& UNSAFE IF HIGHER LEVEL NEEDED,RECOMMEND TO CONTACT SNF IF 02 IS NEEDED.RECOMMEND PTAR TRANSP.SW NOTIFIED.  08/25/13 Jacob Doctor RN,BSN,NCM SW alerted to Pt recommendation of SNF placement.  CM will continue to follow for DC needs.

## 2013-08-26 NOTE — Discharge Summary (Signed)
Physician Discharge Summary  Patient ID: Jacob [name] MRN: [id number] DOB/AGE: 01-15-1928 80 y.o.  Admit date: 08/22/2013 Discharge date: 08/26/2013  Admission Diagnoses: Left renal pelvis cancer  Discharge Diagnoses:  Active Problems:   Coronary artery disease   Left bundle branch block   Urothelial carcinoma of kidney   Discharged Condition: good  Hospital Course:   1 - Left Renal Pelvis Cancer - s/p left robotic nephro-ureterectomy for large volume left renal pelvis cancer 08/22/2013. Path pending at discharge. Foley in place at discharge with plan for cystogram at Urology f/u visit prior to removal. JP removed before discharge as output low and Cr same as serum.  2 - Renal Insufficiency - s/p nephrectomy as per above. Cr 2-2.5 range post-op with preserved UOP and normal electrolytes.   3 - Disposition - Pt lives alone. Ambulatory pre-op. After major surgery PT eval recs skilled nursing at [place] for continued rehab before independent living.   IN summary pt had relatively uneventful hospitalization following major surgery in elderly man.   Consults: PT/OT  Significant Diagnostic Studies: labs: Cr <2.5 at discharge  Treatments: surgery:  left robotic nephro-ureterectomy for large volume left renal pelvis cancer 08/22/2013  Discharge Exam: Blood pressure 143/46, pulse 73, temperature 97.7 F (36.5 C), temperature source Oral, resp. rate 24, height 5\' 9"  (1.753 m), weight 87.317 kg (192 lb 8 oz), SpO2 95.00%. General appearance: alert, cooperative and appears stated age Head: Normocephalic, without obvious abnormality, atraumatic Nose: Nares normal. Septum midline. Mucosa normal. No drainage or sinus tenderness. Throat: lips, mucosa, and tongue normal; teeth and gums normal Neck: supple, symmetrical, trachea midline Back: symmetric, no curvature. ROM normal. No CVA tenderness. Resp: non-labored Cardio: Nl rate GI: soft, non-tender; bowel sounds normal; no masses,  no  organomegaly Male genitalia: normal, foley c/d/i with yellow urine.  Extremities: extremities normal, atraumatic, no cyanosis or edema Pulses: 2+ and symmetric Skin: Skin color, texture, turgor normal. No rashes or lesions Lymph nodes: Cervical, supraclavicular, and axillary nodes normal. Neurologic: Grossly normal Incision/Wound: Recent port sites and extraction sites c/d/i. No hernias.   Disposition: Skilled Nursing    Medication List         aspirin 81 MG tablet  Take 81 mg by mouth daily.     cetirizine 10 MG tablet  Commonly known as:  ZYRTEC  - Take 10 mg by mouth every morning. ALLERGIES  -      ciprofloxacin 250 MG tablet  Commonly known as:  CIPRO  Take 1 tablet (250 mg total) by mouth 2 (two) times daily. X 3 days. Begin day prior to next urology appointment.     hydrochlorothiazide 25 MG tablet  Commonly known as:  HYDRODIURIL  Take 25 mg by mouth every morning.     isosorbide mononitrate 30 MG 24 hr tablet  Commonly known as:  IMDUR  Take 30 mg by mouth at bedtime.     latanoprost 0.005 % ophthalmic solution  Commonly known as:  XALATAN  Place 1 drop into both eyes at bedtime.     losartan 50 MG tablet  Commonly known as:  COZAAR  Take 50 mg by mouth every morning.     lovastatin 20 MG tablet  Commonly known as:  MEVACOR  Take 20 mg by mouth at bedtime.     multivitamins ther. w/minerals Tabs tablet  Take 1 tablet by mouth daily.     Omega 3 1200 MG Caps  Take 1 capsule by mouth 2 (two) times daily.  omeprazole 20 MG capsule  Commonly known as:  PRILOSEC  Take 20 mg by mouth daily.     ranolazine 500 MG 12 hr tablet  Commonly known as:  RANEXA  Take 500 mg by mouth 2 (two) times daily.     senna-docusate 8.6-50 MG per tablet  Commonly known as:  Senokot-S  Take 1 tablet by mouth 2 (two) times daily. While taking pain meds to prevent constipation     traMADol 50 MG tablet  Commonly known as:  ULTRAM  Take 1 tablet (50 mg total) by  mouth every 6 (six) hours as needed for moderate pain or severe pain. Post-operatively.           Follow-up Information   Follow up with [name], MD On 09/02/2013. (at 2:30 PM for Xray, MD visit, and catheter removal)    Specialty:  Urology   Contact information:   [place] [phone]       Signed: [name] 08/26/2013, 7:28 AM

## 2013-08-26 NOTE — Progress Notes (Signed)
Patient needing O2 for transport to SNF - RNCM, [name] spoke with Jacob [name] but unable to provide portable tank for transport. CSW & RN, [name] spoke with patient re: transportation - brother, Jacob [name] was going to transport him to SNF but patient becomes short of breath without O2. Patient is agreeable now with non-emergency ambulance transport. CSW called for transport & RN, [name] made RN @ [facility] SNF aware of need for O2.   [name], [facility] Clinical Social Worker cell #: [phone]

## 2013-08-26 NOTE — Progress Notes (Signed)
Patient is set to discharge to [facility] today. Patient & brother, Jacob [name] at bedside aware. Discharge packet in [place], [place] aware. Patient's brother Jacob [name] to transport to SNF.   Clinical Social Work Department CLINICAL SOCIAL WORK PLACEMENT NOTE 08/26/2013  Patient:  Jacob [name], Jacob [name]  Account Number:  [id number] Admit date:  08/22/2013  Clinical Social Worker:  Renold Genta  Date/time:  08/25/2013 04:52 PM  Clinical Social Work is seeking post-discharge placement for this patient at the following level of care:   SKILLED NURSING   (*CSW will update this form in Epic as items are completed)   08/25/2013  Patient/family provided with [facility] Department of Clinical Social Work's list of facilities offering this level of care within the geographic area requested by the patient (or if unable, by the patient's family).  08/25/2013  Patient/family informed of their freedom to choose among providers that offer the needed level of care, that participate in Medicare, Medicaid or managed care program needed by the patient, have an available bed and are willing to accept the patient.  08/25/2013  Patient/family informed of [facility]' ownership interest in [facility], as well as of the fact that they are under no obligation to receive care at this facility.  PASARR submitted to EDS on 08/25/2013 PASARR number received on 08/25/2013  FL2 transmitted to all facilities in geographic area requested by pt/family on  08/25/2013 FL2 transmitted to all facilities within larger geographic area on   Patient informed that his/her managed care company has contracts with or will negotiate with  certain facilities, including the following:     Patient/family informed of bed offers received:  08/25/2013 Patient chooses bed at [place] Physician recommends and patient chooses bed at    Patient to be transferred to [place] on  08/26/2013 Patient to  be transferred to facility by patient's brother's car Patient and family notified of transfer on 08/26/2013 Name of family member notified:  patient's brother, Jacob [name] at bedside  The following physician request were entered in Epic:   Additional Comments:   [name], [place] Social Worker cell #: [phone]

## 2013-08-28 ENCOUNTER — Encounter: Payer: Self-pay | Admitting: Nurse Practitioner

## 2013-08-28 ENCOUNTER — Ambulatory Visit (INDEPENDENT_AMBULATORY_CARE_PROVIDER_SITE_OTHER): Payer: Medicare Other | Admitting: Nurse Practitioner

## 2013-08-28 ENCOUNTER — Encounter (HOSPITAL_COMMUNITY): Payer: Self-pay | Admitting: Emergency Medicine

## 2013-08-28 ENCOUNTER — Inpatient Hospital Stay (HOSPITAL_COMMUNITY)
Admission: EM | Admit: 2013-08-28 | Discharge: 2013-09-01 | DRG: 242 | Disposition: A | Discharge status: Skilled Nursing Facility | Payer: Medicare Other | Attending: Cardiovascular Disease | Admitting: Cardiovascular Disease

## 2013-08-28 ENCOUNTER — Emergency Department (HOSPITAL_COMMUNITY): Payer: Medicare Other

## 2013-08-28 VITALS — BP 175/56 | HR 39 | Ht 69.0 in | Wt 198.2 lb

## 2013-08-28 DIAGNOSIS — M129 Arthropathy, unspecified: Secondary | ICD-10-CM | POA: Diagnosis present

## 2013-08-28 DIAGNOSIS — I441 Atrioventricular block, second degree: Secondary | ICD-10-CM

## 2013-08-28 DIAGNOSIS — Z905 Acquired absence of kidney: Secondary | ICD-10-CM | POA: Diagnosis not present

## 2013-08-28 DIAGNOSIS — I10 Essential (primary) hypertension: Secondary | ICD-10-CM

## 2013-08-28 DIAGNOSIS — K219 Gastro-esophageal reflux disease without esophagitis: Secondary | ICD-10-CM | POA: Diagnosis present

## 2013-08-28 DIAGNOSIS — I509 Heart failure, unspecified: Secondary | ICD-10-CM

## 2013-08-28 DIAGNOSIS — N400 Enlarged prostate without lower urinary tract symptoms: Secondary | ICD-10-CM | POA: Diagnosis present

## 2013-08-28 DIAGNOSIS — C642 Malignant neoplasm of left kidney, except renal pelvis: Secondary | ICD-10-CM | POA: Insufficient documentation

## 2013-08-28 DIAGNOSIS — C659 Malignant neoplasm of unspecified renal pelvis: Secondary | ICD-10-CM | POA: Diagnosis present

## 2013-08-28 DIAGNOSIS — R001 Bradycardia, unspecified: Secondary | ICD-10-CM | POA: Insufficient documentation

## 2013-08-28 DIAGNOSIS — Z880 Allergy status to penicillin: Secondary | ICD-10-CM | POA: Diagnosis not present

## 2013-08-28 DIAGNOSIS — N179 Acute kidney failure, unspecified: Secondary | ICD-10-CM | POA: Diagnosis present

## 2013-08-28 DIAGNOSIS — Z7982 Long term (current) use of aspirin: Secondary | ICD-10-CM | POA: Diagnosis not present

## 2013-08-28 DIAGNOSIS — Z9861 Coronary angioplasty status: Secondary | ICD-10-CM

## 2013-08-28 DIAGNOSIS — Z950 Presence of cardiac pacemaker: Secondary | ICD-10-CM

## 2013-08-28 DIAGNOSIS — I498 Other specified cardiac arrhythmias: Secondary | ICD-10-CM | POA: Diagnosis present

## 2013-08-28 DIAGNOSIS — E785 Hyperlipidemia, unspecified: Secondary | ICD-10-CM | POA: Diagnosis present

## 2013-08-28 DIAGNOSIS — C652 Malignant neoplasm of left renal pelvis: Secondary | ICD-10-CM

## 2013-08-28 DIAGNOSIS — N289 Disorder of kidney and ureter, unspecified: Secondary | ICD-10-CM

## 2013-08-28 DIAGNOSIS — I129 Hypertensive chronic kidney disease with stage 1 through stage 4 chronic kidney disease, or unspecified chronic kidney disease: Secondary | ICD-10-CM | POA: Diagnosis present

## 2013-08-28 DIAGNOSIS — Z79899 Other long term (current) drug therapy: Secondary | ICD-10-CM

## 2013-08-28 DIAGNOSIS — I4891 Unspecified atrial fibrillation: Secondary | ICD-10-CM | POA: Diagnosis present

## 2013-08-28 DIAGNOSIS — Z8551 Personal history of malignant neoplasm of bladder: Secondary | ICD-10-CM | POA: Diagnosis not present

## 2013-08-28 DIAGNOSIS — I5031 Acute diastolic (congestive) heart failure: Secondary | ICD-10-CM | POA: Diagnosis present

## 2013-08-28 DIAGNOSIS — H409 Unspecified glaucoma: Secondary | ICD-10-CM | POA: Diagnosis present

## 2013-08-28 DIAGNOSIS — I2510 Atherosclerotic heart disease of native coronary artery without angina pectoris: Secondary | ICD-10-CM | POA: Diagnosis present

## 2013-08-28 DIAGNOSIS — N189 Chronic kidney disease, unspecified: Secondary | ICD-10-CM | POA: Diagnosis present

## 2013-08-28 DIAGNOSIS — I447 Left bundle-branch block, unspecified: Secondary | ICD-10-CM | POA: Diagnosis present

## 2013-08-28 DIAGNOSIS — C661 Malignant neoplasm of right ureter: Secondary | ICD-10-CM | POA: Insufficient documentation

## 2013-08-28 DIAGNOSIS — Z87891 Personal history of nicotine dependence: Secondary | ICD-10-CM

## 2013-08-28 LAB — CREATININE, SERUM
CREATININE: 2.7 mg/dL — AB (ref 0.50–1.35)
GFR, EST AFRICAN AMERICAN: 24 mL/min — AB (ref >90)
GFR, EST NON AFRICAN AMERICAN: 20 mL/min — AB (ref >90)

## 2013-08-28 LAB — URINALYSIS, ROUTINE W REFLEX MICROSCOPIC
Bilirubin Urine: NEGATIVE
GLUCOSE, UA: NEGATIVE mg/dL
KETONES UR: NEGATIVE mg/dL
Leukocytes, UA: NEGATIVE
NITRITE: NEGATIVE
PH: 5.5 (ref 5.0–8.0)
Protein, ur: 30 mg/dL — AB
SPECIFIC GRAVITY, URINE: 1.009 (ref 1.005–1.030)
Urobilinogen, UA: 0.2 mg/dL (ref 0.0–1.0)

## 2013-08-28 LAB — CBC
HCT: 32.1 % — ABNORMAL LOW (ref 39.0–52.0)
HEMATOCRIT: 33 % — AB (ref 39.0–52.0)
HEMOGLOBIN: 11 g/dL — AB (ref 13.0–17.0)
Hemoglobin: 10.5 g/dL — ABNORMAL LOW (ref 13.0–17.0)
MCH: 31.3 pg (ref 26.0–34.0)
MCH: 31.7 pg (ref 26.0–34.0)
MCHC: 33.3 g/dL (ref 30.0–36.0)
MCHC: 32.7 g/dL (ref 30.0–36.0)
MCV: 94 fL (ref 78.0–100.0)
MCV: 97 fL (ref 78.0–100.0)
Platelets: 194 10*3/uL (ref 150–400)
Platelets: 177 10*3/uL (ref 150–400)
RBC: 3.51 MIL/uL — ABNORMAL LOW (ref 4.22–5.81)
RBC: 3.31 MIL/uL — AB (ref 4.22–5.81)
RDW: 13.6 % (ref 11.5–15.5)
RDW: 13.7 % (ref 11.5–15.5)
WBC: 6.7 10*3/uL (ref 4.0–10.5)
WBC: 6.8 10*3/uL (ref 4.0–10.5)

## 2013-08-28 LAB — URINE MICROSCOPIC-ADD ON

## 2013-08-28 LAB — I-STAT CHEM 8, ED
BUN: 44 mg/dL — ABNORMAL HIGH (ref 6–23)
CALCIUM ION: 1.14 mmol/L (ref 1.13–1.30)
CREATININE: 2.9 mg/dL — AB (ref 0.50–1.35)
Chloride: 98 mEq/L (ref 96–112)
GLUCOSE: 126 mg/dL — AB (ref 70–99)
HEMATOCRIT: 33 % — AB (ref 39.0–52.0)
Hemoglobin: 11.2 g/dL — ABNORMAL LOW (ref 13.0–17.0)
Potassium: 4.3 mEq/L (ref 3.7–5.3)
Sodium: 135 mEq/L — ABNORMAL LOW (ref 137–147)
TCO2: 31 mmol/L (ref 0–100)

## 2013-08-28 LAB — TROPONIN I: Troponin I: <0.3 ng/mL (ref <0.30)

## 2013-08-28 LAB — MRSA PCR SCREENING: MRSA BY PCR: NEGATIVE

## 2013-08-28 LAB — PRO B NATRIURETIC PEPTIDE: Pro B Natriuretic peptide (BNP): 970.2 pg/mL — ABNORMAL HIGH (ref 0–450)

## 2013-08-28 LAB — TSH: TSH: 1.75 u[IU]/mL (ref 0.350–4.500)

## 2013-08-28 LAB — MAGNESIUM: Magnesium: 2 mg/dL (ref 1.5–2.5)

## 2013-08-28 MED ORDER — ASPIRIN EC 81 MG PO TBEC
81.0000 mg | DELAYED_RELEASE_TABLET | Freq: Every day | ORAL | Status: DC
  Administered 2013-08-28 – 2013-09-01 (×5): 81 mg via ORAL
  Filled 2013-08-28 (×5): qty 1

## 2013-08-28 MED ORDER — SENNOSIDES-DOCUSATE SODIUM 8.6-50 MG PO TABS
1.0000 | ORAL_TABLET | Freq: Two times a day (BID) | ORAL | Status: DC
  Administered 2013-08-28 – 2013-08-31 (×7): 1 via ORAL
  Filled 2013-08-28 (×10): qty 1

## 2013-08-28 MED ORDER — PANTOPRAZOLE SODIUM 40 MG PO TBEC
40.0000 mg | DELAYED_RELEASE_TABLET | Freq: Every day | ORAL | Status: DC
  Administered 2013-08-29 – 2013-09-01 (×4): 40 mg via ORAL
  Filled 2013-08-28 (×4): qty 1

## 2013-08-28 MED ORDER — HEPARIN SODIUM (PORCINE) 5000 UNIT/ML IJ SOLN
5000.0000 [IU] | Freq: Three times a day (TID) | INTRAMUSCULAR | Status: DC
  Administered 2013-08-28: 5000 [IU] via SUBCUTANEOUS
  Filled 2013-08-28: qty 1

## 2013-08-28 MED ORDER — FUROSEMIDE 10 MG/ML IJ SOLN
40.0000 mg | Freq: Once | INTRAMUSCULAR | Status: AC
  Administered 2013-08-28: 40 mg via INTRAVENOUS
  Filled 2013-08-28: qty 4

## 2013-08-28 MED ORDER — HEPARIN SODIUM (PORCINE) 5000 UNIT/ML IJ SOLN
5000.0000 [IU] | Freq: Three times a day (TID) | INTRAMUSCULAR | Status: DC
  Administered 2013-08-29 – 2013-09-01 (×8): 5000 [IU] via SUBCUTANEOUS
  Filled 2013-08-28 (×13): qty 1

## 2013-08-28 MED ORDER — SODIUM CHLORIDE 0.9 % IJ SOLN: 3.0000 mL | INTRAMUSCULAR | Status: DC | PRN

## 2013-08-28 MED ORDER — SODIUM CHLORIDE 0.9 % IJ SOLN
3.0000 mL | Freq: Two times a day (BID) | INTRAMUSCULAR | Status: DC
Start: 2013-08-28 — End: 2013-09-01
  Administered 2013-08-28 – 2013-08-31 (×6): 3 mL via INTRAVENOUS

## 2013-08-28 MED ORDER — LORATADINE 10 MG PO TABS
10.0000 mg | ORAL_TABLET | Freq: Every day | ORAL | Status: DC
  Administered 2013-08-29 – 2013-09-01 (×4): 10 mg via ORAL
  Filled 2013-08-28 (×4): qty 1

## 2013-08-28 MED ORDER — ISOSORBIDE MONONITRATE ER 30 MG PO TB24
30.0000 mg | ORAL_TABLET | Freq: Every day | ORAL | Status: DC
  Administered 2013-08-28 – 2013-08-31 (×4): 30 mg via ORAL
  Filled 2013-08-28 (×6): qty 1

## 2013-08-28 MED ORDER — TRAMADOL HCL 50 MG PO TABS
50.0000 mg | ORAL_TABLET | Freq: Four times a day (QID) | ORAL | Status: DC | PRN
  Administered 2013-08-29 – 2013-08-31 (×4): 50 mg via ORAL
  Filled 2013-08-28 (×4): qty 1

## 2013-08-28 MED ORDER — SODIUM CHLORIDE 0.9 % IV SOLN
250.0000 mL | INTRAVENOUS | Status: DC | PRN
Start: 2013-08-28 — End: 2013-09-01

## 2013-08-28 MED ORDER — ADULT MULTIVITAMIN W/MINERALS CH
1.0000 | ORAL_TABLET | Freq: Every day | ORAL | Status: DC
  Administered 2013-08-28 – 2013-09-01 (×5): 1 via ORAL
  Filled 2013-08-28 (×5): qty 1

## 2013-08-28 MED ORDER — SIMVASTATIN 20 MG PO TABS
20.0000 mg | ORAL_TABLET | Freq: Every day | ORAL | Status: DC
  Administered 2013-08-28 – 2013-08-31 (×4): 20 mg via ORAL
  Filled 2013-08-28 (×5): qty 1

## 2013-08-28 MED ORDER — LATANOPROST 0.005 % OP SOLN
1.0000 [drp] | Freq: Every day | OPHTHALMIC | Status: DC
  Administered 2013-08-28 – 2013-08-31 (×4): 1 [drp] via OPHTHALMIC
  Filled 2013-08-28 (×2): qty 2.5

## 2013-08-28 MED ORDER — ACETAMINOPHEN 325 MG PO TABS: 650.0000 mg | ORAL_TABLET | ORAL | Status: DC | PRN

## 2013-08-28 MED ORDER — THERA M PLUS PO TABS: 1.0000 | ORAL_TABLET | Freq: Every day | ORAL | Status: DC

## 2013-08-28 MED ORDER — CETYLPYRIDINIUM CHLORIDE 0.05 % MT LIQD
7.0000 mL | Freq: Two times a day (BID) | OROMUCOSAL | Status: DC
  Administered 2013-08-28 – 2013-08-30 (×4): 7 mL via OROMUCOSAL

## 2013-08-28 MED ORDER — ONDANSETRON HCL 4 MG/2ML IJ SOLN: 4.0000 mg | Freq: Four times a day (QID) | INTRAMUSCULAR | Status: DC | PRN

## 2013-08-28 NOTE — H&P (Signed)
Patient ID: Jacob [name] MRN: [id number], DOB/AGE: 78-Oct-1930   Admit date: 08/28/2013   Primary Care Provider: [name], MD  Primary Cardiologist: Jerilynn Mages. Fletcher Anon, MD   Pt. Profile:  80 y/o male who presents today 2/2 fatigue, dyspnea, and bradycardia.  Problem List  Past Medical History  Diagnosis Date  . GERD (gastroesophageal reflux disease)   . H/O hiatal hernia   . Arthritis     hands  . BPH (benign prostatic hypertrophy)   . History of atrial fibrillation     a. 2006.  . Glaucoma of both eyes   . Transitional cell carcinoma of bladder FOLLOWED BY DR Gaynelle Arabian    BLADDER INSTILLATION TX'S  . Hyperlipidemia   . Hypertension   . LBBB (left bundle branch block)   . First degree heart block   . Coronary artery disease     a. 2006 s/p PCI/Stenting x 2 to the RCA;  b. 05/03/12 Cardiac CT: Patent RCA stent, nonobs dzs;  c. 01/2013 Ex MV: nl EF, no ischemia.  Marland Kitchen History of diverticulitis   . Left Renal Pelvis Cancer     a. 08/2013 s/p L robotic assisted nephro-ureterectomy.  Marland Kitchen History of kidney stones   . HTN (hypertension)   . Hyperlipidemia   . Renal insufficiency     a. Creat rose to 2.34 post-op L nephrectomy.  . Sinus bradycardia     Past Surgical History  Procedure Laterality Date  . Cystoscopy with biopsy  04/13/2011  RECURRENT TCC OF BLADDER    Procedure: CYSTOSCOPY WITH BIOPSY;  Surgeon: [name], MD;  Location: [facility];  Service: Urology;  Laterality: N/A;  Cold Cup Biopsy  . Transurethral resection of bladder tumor  12-22-2010    BLADDER CANCER  . Appendectomy  02/1988  . Cataract extraction w/ intraocular lens  implant, bilateral  2011  . Transurethral resection of bladder tumor  08/04/2011    Procedure: TRANSURETHRAL RESECTION OF BLADDER TUMOR (TURBT);  Surgeon: [name], MD;  Location: [facility];  Service: Urology;  Laterality: N/A;  . Coronary angioplasty with stent placement  2006    [facility]    X2 STENTS  TO RCA   . Cystoscopy with retrograde pyelogram, ureteroscopy and stent placement Left 06/19/2013    Procedure: CYSTOSCOPY WITH LEFT RETROGRADE PYELOGRAM, LEFT URETEROSCOPY, ureteral balloon dilatation, BIOPSY LEFT KIDNEY;  Surgeon: [name], MD;  Location: [facility];  Service: Urology;  Laterality: Left;  . Robot assited laparoscopic nephroureterectomy Left 08/22/2013    Procedure: ROBOT ASSITED LAPAROSCOPIC NEPHROURETERECTOMY;  Surgeon: [name], MD;  Location: [facility];  Service: Urology;  Laterality: Left;  . Cystoscopy w/ ureteral stent placement Left 08/22/2013    Procedure: CYSTOSCOPY WITH RETROGRADE PYELOGRAM/URETERAL STENT PLACEMENT;  Surgeon: [name], MD;  Location: [facility];  Service: Urology;  Laterality: Left;  . Inguinal hernia repair Left 08/22/2013    Procedure: LAPAROSCOPIC INGUINAL HERNIA;  Surgeon: [name], MD;  Location: [facility];  Service: Urology;  Laterality: Left;     Allergies  Allergies  Allergen Reactions  . Penicillins Hives    BLISTERS    HPI  80 y/o male with the above complex problem list. He has a h/o nl LV fxn and CAD s/p PCI/stenting of the RCA x 2 in 2006 @ [place]. His last myoview was earlier this year and was non-ischemic. He was recently dx with left renal pelvis carcinoma and was admitted to [facility] for left nephrectomy and  ureterectomy. Post-op course was complicated by mild volume overload requiring IV lasix. Also, he developed renal insuff, bumping his creat to 2.34. He was very weak and deconditioned and was discharged to [facility] for rehabilitation. He was discharged on O2. Despite his renal insuff, he was ordered to resume HCTZ and losartan. Since his arrival @ rehab on Tuesday 8/25, he has been very dyspneic with any activity. He has also noted intermittent lightheadedness with position changes. He ha not had any chest pain but has noted early satiety, mild lower ext edema, and increasing abd girth. His wt, which was  192 prior to his hospitalization has been as high as 200 lbs since discharge. In clinic today, he is in 2:1 heart block with rates in the 40's.    Home Medications  Prior to Admission medications   Medication Sig Start Date End Date Taking? Authorizing Provider  aspirin 81 MG tablet Take 81 mg by mouth daily.   Yes Historical Provider, MD  cetirizine (ZYRTEC) 10 MG tablet Take 10 mg by mouth every morning. ALLERGIES    Yes Historical Provider, MD  hydrochlorothiazide (HYDRODIURIL) 25 MG tablet Take 25 mg by mouth every morning.    Yes Historical Provider, MD  isosorbide mononitrate (IMDUR) 30 MG 24 hr tablet Take 30 mg by mouth at bedtime.    Yes Historical Provider, MD  latanoprost (XALATAN) 0.005 % ophthalmic solution Place 1 drop into both eyes at bedtime.    Yes Historical Provider, MD  losartan (COZAAR) 50 MG tablet Take 50 mg by mouth every morning.    Yes Historical Provider, MD  lovastatin (MEVACOR) 20 MG tablet Take 20 mg by mouth at bedtime.    Yes Historical Provider, MD  Multiple Vitamins-Minerals (MULTIVITAMINS THER. W/MINERALS) TABS Take 1 tablet by mouth daily.    Yes Historical Provider, MD  Omega-3 Fatty Acids (OMEGA 3 PO) Take 1,200 mg by mouth 2 (two) times daily.    Yes Historical Provider, MD  omeprazole (PRILOSEC) 20 MG capsule Take 20 mg by mouth daily.   Yes Historical Provider, MD  ranolazine (RANEXA) 500 MG 12 hr tablet Take 500 mg by mouth 2 (two) times daily.   Yes Historical Provider, MD  senna-docusate (SENOKOT-S) 8.6-50 MG per tablet Take 1 tablet by mouth 2 (two) times daily. While taking pain meds to prevent constipation 08/26/13  Yes [name], MD  traMADol (ULTRAM) 50 MG tablet Take 1 tablet (50 mg total) by mouth every 6 (six) hours as needed for moderate pain or severe pain. Post-operatively. 08/26/13  Yes [name], MD    Family History  Family History  Problem Relation Age of Onset  . Heart attack Mother   . Heart attack Father    Family  Status  Relation Status Death Age  . Mother Deceased   . Father Deceased      Social History  History   Social History  . Marital Status: Widowed    Spouse Name: N/A    Number of Children: N/A  . Years of Education: N/A   Occupational History  . Not on file.   Social History Main Topics  . Smoking status: Former Smoker -- 1.00 packs/day for 50 years    Types: Cigarettes    Quit date: 11/02/2000  . Smokeless tobacco: Never Used  . Alcohol Use: No  . Drug Use: No  . Sexual Activity: Not on file   Other Topics Concern  . Not on file   Social History Narrative  .  No narrative on file     Review of Systems  General: No chills, fever, night sweats or weight changes.  Cardiovascular: No chest pain, +++ dyspnea on exertion, +++ edema, no orthopnea, palpitations, paroxysmal nocturnal dyspnea, +++ early satiety. No syncope.  Dermatological: No rash, lesions/masses  Respiratory: No cough, dyspnea  Urologic: No hematuria, dysuria. He has foley in place w/ leg bag.  Abdominal: He has had abd bloating and frequent belching. No nausea, vomiting, diarrhea, bright red blood per rectum, melena, or hematemesis  Neurologic: No visual changes, wkns, changes in mental status.  All other systems reviewed and are otherwise negative except as noted above.  Physical Exam  Blood pressure 137/38, pulse 38, temperature 98 F (36.7 C), temperature source Oral, resp. rate 18, height 5\' 9"  (1.753 m), weight 198 lb (89.812 kg), SpO2 94.00%.  General: Pleasant, NAD  Psych: Normal affect.  Neuro: Alert and oriented X 3. Moves all extremities spontaneously.  HEENT: Normal  Neck: Supple without bruits. Mod elevated JVP.  Lungs: Resp regular and unlabored, very diminished breath sounds bilat - worse in right base. Crackles noted in left base.  Heart: RRR, bradycardic, no s3, s4, or murmurs.  Abdomen: firm, non-tender, non-distended, BS + x 4.  Extremities: No clubbing, cyanosis. 1+ bilat LE  edema. DP/PT/Radials 1+ and equal bilaterally.     Labs  No results found for this basename: CKTOTAL, CKMB, TROPONINI,  in the last 72 hours Lab Results  Component Value Date   WBC 6.7 08/28/2013   HGB 11.2* 08/28/2013   HCT 33.0* 08/28/2013   MCV 94.0 08/28/2013   PLT 194 08/28/2013    Recent Labs Lab 08/25/13 0405 08/28/13 1529  NA 136* 135*  K 4.1 4.3  CL 100 98  CO2 27  --   BUN 19 44*  CREATININE 2.34* 2.90*  CALCIUM 8.6  --   GLUCOSE 127* 126*     Radiology/Studies  Dg Chest 2 View  08/15/2013   CLINICAL DATA:  Preop for left nephrectomy  EXAM: CHEST  2 VIEW  COMPARISON:  08/04/2011  FINDINGS: Cardiomediastinal silhouette is stable. No acute infiltrate or pleural effusion. No pulmonary edema. Mild elevation of the right hemidiaphragm. Degenerative changes thoracic spine.  IMPRESSION: No active cardiopulmonary disease.   Electronically Signed   By: Lahoma Crocker M.D.   On: 08/15/2013 11:15   Dg Retrograde Pyelogram  08/22/2013   CLINICAL DATA:  Cancer of the left renal pelvis.  EXAM: RETROGRADE PYELOGRAM  COMPARISON:  CT scan dated 05/27/2013  FINDINGS: Left retrograde pyelogram demonstrates a mass obstructing the left upper pole infundibulum. The rest of the left renal collecting system appears normal including the entire left ureter.  IMPRESSION: Mass obstructing the upper pole infundibulum of the left kidney.   Electronically Signed   By: Rozetta Nunnery M.D.   On: 08/22/2013 08:44   Dg Chest Port 1v Same Day  08/25/2013   CLINICAL DATA:  Shortness of breath.  EXAM: PORTABLE CHEST - 1 VIEW SAME DAY  COMPARISON:  August 15, 2013.  FINDINGS: Stable cardiomediastinal silhouette. Mildly increased interstitial densities are noted throughout both lungs which may represent pulmonary edema. No pneumothorax or significant pleural effusion is noted. Pneumoperitoneum is noted under both hemidiaphragms consistent with a history of recent abdominal surgery.  IMPRESSION: Mildly increased  interstitial densities are noted throughout both lungs concerning for possible pulmonary edema.   Electronically Signed   By: Sabino Dick M.D.   On: 08/25/2013 11:52    ECG  ECG - sinus brady, 39, 2:1 heart block, non-specific st/t changes.   ASSESSMENT AND PLAN  Symptomatic bradycardia/Mobitz 2, 2:1 Heart Block: Pt presents with persistent fatigue and orthostatic lightheadedness in the setting of 2:1 heart block with rates in the high 30's. He also has evidence of volume overload on exam. He is not on any AV nodal blocking agents. He has not had any labs drawn since discharge. I have discussed his case with Dr. Fletcher Anon and given symptomatic bradycardia with CHF, we have arranged for admission by our team @ [facility]. Pt will present to the ER there. We will need to f/u bmet to determine whether or not there is a reversible metabolic abnormality, esp in the setting of progressive renal insufficiency prior to recent discharge with continuation of HCTZ and ARB therapy. Barring reversible cause for bradycardia, he will need EP consultation for possible PPM. Will monitor on tele tonight. Check echo and tsh.   Acute CHF, presumably diastolic: Pt has a h/o nl LV fxn by spect earlier this year. Since discharge, his wt is up and he has had dyspnea, edema, early satiety, and increasing abd girth. He does have evidence of mild to moderate volume overload on exam. As above, f/u labs in the ED. I will write for one dose of IV lasix for today. Hold HCTZ/ARB for the time being. Will check echo.   CAD: No recent chest pain. Cont asa, statin, nitrate.   HTN: BP stable despite bradycardia. Follow off of ARB/HCTZ.   L Renal Pelvis Carcinoma: S/p L nephrectomy and ureterectomy last week. Foley in place. Urol f/u planned.   Renal insufficiency: Creat rose post-operatively. F/u bmet today. Creat 2.9 upon admission   Original note by Ignacia Bayley NP    Signed, Eileen Stanford, PA-C 08/28/2013, 3:43 PM  Pager  [phone]

## 2013-08-28 NOTE — Progress Notes (Signed)
Patient Name: Jacob [name] Date of Encounter: 08/28/2013  Primary Care Provider:  [name], MD Primary Cardiologist:  Jerilynn Mages. Fletcher Anon, MD   Patient Profile  80 y/o male who presents today 2/2 fatigue, dyspnea, and bradycardia.  Problem List   Past Medical History  Diagnosis Date  . GERD (gastroesophageal reflux disease)   . H/O hiatal hernia   . Arthritis     hands  . BPH (benign prostatic hypertrophy)   . History of atrial fibrillation     a. 2006.  . Glaucoma of both eyes   . Transitional cell carcinoma of bladder FOLLOWED BY DR Gaynelle Arabian    BLADDER INSTILLATION TX'S  . Hyperlipidemia   . Hypertension   . LBBB (left bundle branch block)   . First degree heart block   . Coronary artery disease     a. 2006 s/p PCI/Stenting x 2 to the RCA;  b. 05/03/12 Cardiac CT: Patent RCA stent, nonobs dzs;  c. 01/2013 Ex MV: nl EF, no ischemia.  Marland Kitchen History of diverticulitis   . Left Renal Pelvis Cancer     a. 08/2013 s/p L robotic assisted nephro-ureterectomy.  Marland Kitchen History of kidney stones   . HTN (hypertension)   . Hyperlipidemia   . Renal insufficiency     a. Creat rose to 2.34 post-op L nephrectomy.  . Sinus bradycardia    Past Surgical History  Procedure Laterality Date  . Cystoscopy with biopsy  04/13/2011  RECURRENT TCC OF BLADDER    Procedure: CYSTOSCOPY WITH BIOPSY;  Surgeon: [name], MD;  Location: [facility];  Service: Urology;  Laterality: N/A;  Cold Cup Biopsy  . Transurethral resection of bladder tumor  12-22-2010    BLADDER CANCER  . Appendectomy  02/1988  . Cataract extraction w/ intraocular lens  implant, bilateral  2011  . Transurethral resection of bladder tumor  08/04/2011    Procedure: TRANSURETHRAL RESECTION OF BLADDER TUMOR (TURBT);  Surgeon: [name], MD;  Location: [facility];  Service: Urology;  Laterality: N/A;  . Coronary angioplasty with stent placement  2006    [facility]    X2 STENTS  TO RCA  . Cystoscopy with retrograde  pyelogram, ureteroscopy and stent placement Left 06/19/2013    Procedure: CYSTOSCOPY WITH LEFT RETROGRADE PYELOGRAM, LEFT URETEROSCOPY, ureteral balloon dilatation, BIOPSY LEFT KIDNEY;  Surgeon: [name], MD;  Location: [facility];  Service: Urology;  Laterality: Left;  . Robot assited laparoscopic nephroureterectomy Left 08/22/2013    Procedure: ROBOT ASSITED LAPAROSCOPIC NEPHROURETERECTOMY;  Surgeon: [name], MD;  Location: [facility];  Service: Urology;  Laterality: Left;  . Cystoscopy w/ ureteral stent placement Left 08/22/2013    Procedure: CYSTOSCOPY WITH RETROGRADE PYELOGRAM/URETERAL STENT PLACEMENT;  Surgeon: [name], MD;  Location: [facility];  Service: Urology;  Laterality: Left;  . Inguinal hernia repair Left 08/22/2013    Procedure: LAPAROSCOPIC INGUINAL HERNIA;  Surgeon: [name], MD;  Location: [facility];  Service: Urology;  Laterality: Left;    Allergies  Allergies  Allergen Reactions  . Penicillins Hives    BLISTERS    HPI  80 y/o male with the above complex problem list.  He has a h/o nl LV fxn and CAD s/p PCI/stenting of the RCA x 2 in 2006 @ [place].  His last myoview was earlier this year and was non-ischemic.  He was recently dx with left renal pelvis carcinoma and was admitted to [facility] for left nephrectomy and ureterectomy.  Post-op course was  complicated by mild volume overload requiring IV lasix.  Also, he developed renal insuff, bumping his creat to 2.34.  He was very weak and deconditioned and was discharged to [facility] for rehabilitation.  He was discharged on O2.  Despite his renal insuff, he was ordered to resume HCTZ and losartan.  Since his arrival @ rehab on Tuesday 8/25, he has been very dyspneic with any activity.  He has also noted intermittent lightheadedness with position changes.  He ha not had any chest pain but has noted early satiety, mild lower ext edema, and increasing abd girth.  His wt, which was 192 prior to his  hospitalization has been as high as 200 lbs since discharge.  In clinic today, he is in 2:1 heart block with rates in the 40's.  Home Medications  Prior to Admission medications   Medication Sig Start Date End Date Taking? Authorizing Provider  aspirin 81 MG tablet Take 81 mg by mouth daily.   Yes Historical Provider, MD  cetirizine (ZYRTEC) 10 MG tablet Take 10 mg by mouth every morning. ALLERGIES    Yes Historical Provider, MD  hydrochlorothiazide (HYDRODIURIL) 25 MG tablet Take 25 mg by mouth every morning.    Yes Historical Provider, MD  isosorbide mononitrate (IMDUR) 30 MG 24 hr tablet Take 30 mg by mouth at bedtime.    Yes Historical Provider, MD  latanoprost (XALATAN) 0.005 % ophthalmic solution Place 1 drop into both eyes at bedtime.    Yes Historical Provider, MD  losartan (COZAAR) 50 MG tablet Take 50 mg by mouth every morning.    Yes Historical Provider, MD  lovastatin (MEVACOR) 20 MG tablet Take 20 mg by mouth at bedtime.    Yes Historical Provider, MD  Multiple Vitamins-Minerals (MULTIVITAMINS THER. W/MINERALS) TABS Take 1 tablet by mouth daily.    Yes Historical Provider, MD  Omega-3 Fatty Acids (OMEGA 3 PO) Take 500 mg by mouth 2 (two) times daily.   Yes Historical Provider, MD  omeprazole (PRILOSEC) 20 MG capsule Take 20 mg by mouth daily.   Yes Historical Provider, MD  ranolazine (RANEXA) 500 MG 12 hr tablet Take 500 mg by mouth 2 (two) times daily.   Yes Historical Provider, MD  senna-docusate (SENOKOT-S) 8.6-50 MG per tablet Take 1 tablet by mouth 2 (two) times daily. While taking pain meds to prevent constipation 08/26/13  Yes [name], MD  traMADol (ULTRAM) 50 MG tablet Take 1 tablet (50 mg total) by mouth every 6 (six) hours as needed for moderate pain or severe pain. Post-operatively. 08/26/13  Yes [name], MD    Family History  Family History  Problem Relation Age of Onset  . Heart attack Mother   . Heart attack Father     Social History  History    Social History  . Marital Status: Widowed    Spouse Name: N/A    Number of Children: N/A  . Years of Education: N/A   Occupational History  . Not on file.   Social History Main Topics  . Smoking status: Former Smoker -- 1.00 packs/day for 50 years    Types: Cigarettes    Quit date: 11/02/2000  . Smokeless tobacco: Never Used  . Alcohol Use: No  . Drug Use: No  . Sexual Activity: Not on file   Other Topics Concern  . Not on file   Social History Narrative  . No narrative on file     Review of Systems General:  No chills, fever,  night sweats or weight changes.  Cardiovascular:  No chest pain, +++ dyspnea on exertion, +++ edema, no orthopnea, palpitations, paroxysmal nocturnal dyspnea, +++ early satiety.  No syncope. Dermatological: No rash, lesions/masses Respiratory: No cough, dyspnea Urologic: No hematuria, dysuria.  He has foley in place w/ leg bag. Abdominal:   He has had abd bloating and frequent belching. No nausea, vomiting, diarrhea, bright red blood per rectum, melena, or hematemesis Neurologic:  No visual changes, wkns, changes in mental status. All other systems reviewed and are otherwise negative except as noted above.  Physical Exam  Blood pressure 175/56, pulse 39, height 5\' 9"  (1.753 m), weight 198 lb 4 oz (89.926 kg).  General: Pleasant, NAD Psych: Normal affect. Neuro: Alert and oriented X 3. Moves all extremities spontaneously. HEENT: Normal  Neck: Supple without bruits.  Mod elevated JVP. Lungs:  Resp regular and unlabored, very diminished breath sounds bilat - worse in right base.  Crackles noted in left base. Heart: RRR, bradycardic, no s3, s4, or murmurs. Abdomen: firm, non-tender, non-distended, BS + x 4.  Extremities: No clubbing, cyanosis.  1+ bilat LE edema. DP/PT/Radials 1+ and equal bilaterally.  Accessory Clinical Findings  ECG - sinus brady, 39, 2:1 heart block, non-specific st/t changes.  Assessment & Plan  1.  Symptomatic  bradycardia/Mobitz 2, 2:1 Heart Block:  Pt presents with persistent fatigue and orthostatic lightheadedness in the setting of 2:1 heart block with rates in the high 30's.  He also has evidence of volume overload on exam.  He is not on any AV nodal blocking agents.  He has not had any labs drawn since discharge.  I have discussed his case with Dr. Fletcher Anon and given symptomatic bradycardia with CHF, we have arranged for admission by our team @ [facility].  Pt will present to the ER there.  We will need to f/u bmet to determine whether or not there is a reversible metabolic abnormality, esp in the setting of progressive renal insufficiency prior to recent discharge with continuation of HCTZ and ARB therapy.  Barring reversible cause for bradycardia, he will need EP consultation for possible PPM.  Will monitor on tele tonight.  Check echo and tsh.  2.  Acute CHF, presumably diastolic:  Pt has a h/o nl LV fxn by spect earlier this year.  Since discharge, his wt is up and he has had dyspnea, edema, early satiety, and increasing abd girth.  He does have evidence of mild to moderate volume overload on exam.  As above, f/u labs in the ED.  I will write for one dose of IV lasix for today.  Hold HCTZ/ARB for the time being.  Will check echo.  3.  CAD:  No recent chest pain.  Cont asa, statin, nitrate.  4.  HTN:  BP stable despite bradycardia.  Follow off of ARB/HCTZ.  5.  L Renal Pelvis Carcinoma:  S/p L nephrectomy and ureterectomy last week.  Foley in place.  Urol f/u planned.  6.  Renal insufficiency: Creat rose post-operatively.  F/u bmet today.  7.  Dispo:  Pts family to take him to [facility] ED.  [place] team aware.    Murray Hodgkins, NP 08/28/2013, 2:44 PM

## 2013-08-28 NOTE — ED Provider Notes (Signed)
CSN: [id number]     Arrival date & time 08/28/13  1426 History   First MD Initiated Contact with Patient 08/28/13 [phone]     Chief Complaint  Patient presents with  . Bradycardia     (Consider location/radiation/quality/duration/timing/severity/associated sxs/prior Treatment) Patient is a 80 y.o. male presenting with general illness. The history is provided by the patient.  Illness Location:  Generalized weakness Quality:  2:1 AV block per cardiology in clinic today. Generalized weakness. Onset quality:  Gradual Duration: has been occurring intermittently since his nephrectomy. Timing:  Constant Progression:  Worsening Chronicity:  Recurrent Context:  Recent nephrectomy. Seen in cardiology clinic and noted to have bradycardia with 2:1 AV block.  Relieved by:  Nothing tried Worsened by:  Nothing tried Ineffective treatments:  None tried Associated symptoms: fatigue and shortness of breath   Associated symptoms: no chest pain, no congestion, no cough, no diarrhea, no ear pain, no fever, no headaches, no loss of consciousness, no myalgias, no nausea, no rash, no rhinorrhea, no sore throat, no vomiting and no wheezing   Associated symptoms comment:  Lightheadedness   Past Medical History  Diagnosis Date  . GERD (gastroesophageal reflux disease)   . H/O hiatal hernia   . Arthritis     hands  . BPH (benign prostatic hypertrophy)   . History of atrial fibrillation     a. 2006.  . Glaucoma of both eyes   . Transitional cell carcinoma of bladder FOLLOWED BY DR Gaynelle Arabian    BLADDER INSTILLATION TX'S  . Hyperlipidemia   . Hypertension   . LBBB (left bundle branch block)   . First degree heart block   . Coronary artery disease     a. 2006 s/p PCI/Stenting x 2 to the RCA;  b. 05/03/12 Cardiac CT: Patent RCA stent, nonobs dzs;  c. 01/2013 Ex MV: nl EF, no ischemia.  Marland Kitchen History of diverticulitis   . Left Renal Pelvis Cancer     a. 08/2013 s/p L robotic assisted nephro-ureterectomy.  Marland Kitchen  History of kidney stones   . HTN (hypertension)   . Hyperlipidemia   . Renal insufficiency     a. Creat rose to 2.34 post-op L nephrectomy.  . Sinus bradycardia    Past Surgical History  Procedure Laterality Date  . Cystoscopy with biopsy  04/13/2011  RECURRENT TCC OF BLADDER    Procedure: CYSTOSCOPY WITH BIOPSY;  Surgeon: [name], MD;  Location: [facility];  Service: Urology;  Laterality: N/A;  Cold Cup Biopsy  . Transurethral resection of bladder tumor  12-22-2010    BLADDER CANCER  . Appendectomy  02/1988  . Cataract extraction w/ intraocular lens  implant, bilateral  2011  . Transurethral resection of bladder tumor  08/04/2011    Procedure: TRANSURETHRAL RESECTION OF BLADDER TUMOR (TURBT);  Surgeon: [name], MD;  Location: [facility];  Service: Urology;  Laterality: N/A;  . Coronary angioplasty with stent placement  2006    [facility]    X2 STENTS  TO RCA  . Cystoscopy with retrograde pyelogram, ureteroscopy and stent placement Left 06/19/2013    Procedure: CYSTOSCOPY WITH LEFT RETROGRADE PYELOGRAM, LEFT URETEROSCOPY, ureteral balloon dilatation, BIOPSY LEFT KIDNEY;  Surgeon: [name], MD;  Location: [facility];  Service: Urology;  Laterality: Left;  . Robot assited laparoscopic nephroureterectomy Left 08/22/2013    Procedure: ROBOT ASSITED LAPAROSCOPIC NEPHROURETERECTOMY;  Surgeon: [name], MD;  Location: [facility];  Service: Urology;  Laterality: Left;  . Cystoscopy w/  ureteral stent placement Left 08/22/2013    Procedure: CYSTOSCOPY WITH RETROGRADE PYELOGRAM/URETERAL STENT PLACEMENT;  Surgeon: [name], MD;  Location: [facility];  Service: Urology;  Laterality: Left;  . Inguinal hernia repair Left 08/22/2013    Procedure: LAPAROSCOPIC INGUINAL HERNIA;  Surgeon: [name], MD;  Location: [facility];  Service: Urology;  Laterality: Left;   Family History  Problem Relation Age of Onset  . Heart attack Mother   . Heart  attack Father    History  Substance Use Topics  . Smoking status: Former Smoker -- 1.00 packs/day for 50 years    Types: Cigarettes    Quit date: 11/02/2000  . Smokeless tobacco: Never Used  . Alcohol Use: No    Review of Systems  Constitutional: Positive for fatigue. Negative for fever.  HENT: Negative for congestion, ear pain, rhinorrhea and sore throat.   Respiratory: Positive for shortness of breath. Negative for cough and wheezing.   Cardiovascular: Negative for chest pain.  Gastrointestinal: Negative for nausea, vomiting and diarrhea.  Musculoskeletal: Negative for myalgias.  Skin: Negative for rash.  Neurological: Negative for loss of consciousness and headaches.  All other systems reviewed and are negative.     Allergies  Penicillins  Home Medications   Prior to Admission medications   Medication Sig Start Date End Date Taking? Authorizing Provider  aspirin 81 MG tablet Take 81 mg by mouth daily.    Historical Provider, MD  cetirizine (ZYRTEC) 10 MG tablet Take 10 mg by mouth every morning. ALLERGIES     Historical Provider, MD  hydrochlorothiazide (HYDRODIURIL) 25 MG tablet Take 25 mg by mouth every morning.     Historical Provider, MD  isosorbide mononitrate (IMDUR) 30 MG 24 hr tablet Take 30 mg by mouth at bedtime.     Historical Provider, MD  latanoprost (XALATAN) 0.005 % ophthalmic solution Place 1 drop into both eyes at bedtime.     Historical Provider, MD  losartan (COZAAR) 50 MG tablet Take 50 mg by mouth every morning.     Historical Provider, MD  lovastatin (MEVACOR) 20 MG tablet Take 20 mg by mouth at bedtime.     Historical Provider, MD  Multiple Vitamins-Minerals (MULTIVITAMINS THER. W/MINERALS) TABS Take 1 tablet by mouth daily.     Historical Provider, MD  Omega-3 Fatty Acids (OMEGA 3 PO) Take 500 mg by mouth 2 (two) times daily.    Historical Provider, MD  omeprazole (PRILOSEC) 20 MG capsule Take 20 mg by mouth daily.    Historical Provider, MD   ranolazine (RANEXA) 500 MG 12 hr tablet Take 500 mg by mouth 2 (two) times daily.    Historical Provider, MD  senna-docusate (SENOKOT-S) 8.6-50 MG per tablet Take 1 tablet by mouth 2 (two) times daily. While taking pain meds to prevent constipation 08/26/13   [name], MD  traMADol (ULTRAM) 50 MG tablet Take 1 tablet (50 mg total) by mouth every 6 (six) hours as needed for moderate pain or severe pain. Post-operatively. 08/26/13   [name], MD   BP 137/38  Pulse 38  Temp(Src) 98 F (36.7 C) (Oral)  Resp 18  Ht 5\' 9"  (1.753 m)  Wt 198 lb (89.812 kg)  BMI 29.23 kg/m2  SpO2 94% Physical Exam  Nursing note and vitals reviewed. Constitutional: He is oriented to person, place, and time. He appears well-developed and well-nourished. No distress.  HENT:  Head: Normocephalic and atraumatic.  Eyes: Conjunctivae and EOM are normal. Right eye exhibits no discharge. Left eye  exhibits no discharge.  Neck: Normal range of motion. Neck supple. No tracheal deviation present.  Cardiovascular: Regular rhythm and normal heart sounds.  Bradycardia present.  Exam reveals no friction rub.   No murmur heard. Pulses:      Radial pulses are 2+ on the right side, and 2+ on the left side.  Pulmonary/Chest: Effort normal. No stridor. No respiratory distress. He has no wheezes. He has rales in the right lower field and the left lower field. He exhibits no tenderness.  Abdominal: Soft. He exhibits no distension. There is no tenderness. There is no rebound and no guarding.  Neurological: He is alert and oriented to person, place, and time.  Skin: Skin is warm.  Psychiatric: He has a normal mood and affect.    ED Course  Procedures (including critical care time) Labs Review Labs Reviewed - No data to display  Imaging Review No results found.   EKG Interpretation None      MDM   Final diagnoses:  None     Date: 08/28/2013  Rate: 39  Rhythm: AV block, 2:1 condution  QRS Axis: normal   Intervals: PR prolonged  ST/T Wave abnormalities: normal  Conduction Disutrbances:second-degree A-V block, ( Mobitz II )  Narrative Interpretation:   Old EKG Reviewed: changed from NSR to sinus brady with 2:1 AV block  Jacob [name] 80 y.o. presents with symptomatic bradycardia with 2:1 AV block. Normotensive. HDS. AOx4. NAD. Well appearing. PADS placed. Not on any AV nodal blocking agents. NO chest pain, no SOB. Cardiology aware of the patient and will admit. Available Labs reviewed. Cr 2.9, BUN 44 - likely component of CKD on AKI. CBC and other labs pending prior to admission. Images reviewed by myself. Care discussed with my attending, Dr. Tanna Furry.    Kelby Aline, MD 08/29/13 [phone]

## 2013-08-28 NOTE — ED Provider Notes (Signed)
Pt seen and evaluated.  D/W Dr. Lucrezia Starch ED Resident.  Pt with weakness and fatigue 6dayss/p Robotic assist Lt ureteronephrectomy at [facility].  DC'd to rehab at [place], [place] 48 hours ago.  Weak and lightheaded today and HR found to be 37.  Cardiac HX:  "2 stents" in '06 after "abnormal stress test". Does not see Cardiologist routinely.  EKG: Mobitz 2, Second degree AVB, Rate 36.  SBP 130.  Clinically not in failure.  Labs and CXR ordered.  Will page cardiology for consultation.   Tanna Furry, MD 08/28/13 [phone]

## 2013-08-28 NOTE — ED Notes (Signed)
Pt placed on monitor upon arrival to room. Pt monitored by 12 lead, blood pressure, and pulse ox.

## 2013-08-28 NOTE — H&P (Signed)
   I have seen and examined the patient with Danna Hefty in [place] office. I agree with the above note with the addition of : Symptomatic high-grade AV block. He will require a permanent pacemaker placement after ruling out reversible causes.   Kathlyn Sacramento MD, [facility] 08/28/2013 4:06 PM

## 2013-08-28 NOTE — ED Notes (Signed)
Pt. Was sent to [place] from his rehab center due to his heart rate dropping into the 30s.  Pt. Reports that his heart rate was in the 40s last week prior to his surgery.  Pt. Has a lt. Kidney removed.  Pt. Is alert and oriented X4.  Skin is p/w/d  Pt. Is reporting sob for a few weeks.    Pt. Placed on the monitor and pacer pads.

## 2013-08-28 NOTE — Patient Instructions (Signed)
Please proceed to the [facility] Emergency Room  Your physician recommends that you schedule a follow-up appointment in: 2 weeks with Dr. Fletcher Anon

## 2013-08-29 ENCOUNTER — Encounter (HOSPITAL_COMMUNITY)
Admission: EM | Disposition: A | Discharge status: Skilled Nursing Facility | Payer: Self-pay | Source: Home / Self Care | Attending: Cardiovascular Disease

## 2013-08-29 DIAGNOSIS — I441 Atrioventricular block, second degree: Secondary | ICD-10-CM

## 2013-08-29 DIAGNOSIS — Z950 Presence of cardiac pacemaker: Secondary | ICD-10-CM

## 2013-08-29 DIAGNOSIS — I369 Nonrheumatic tricuspid valve disorder, unspecified: Secondary | ICD-10-CM

## 2013-08-29 HISTORY — DX: Presence of cardiac pacemaker: Z95.0

## 2013-08-29 HISTORY — PX: PERMANENT PACEMAKER INSERTION: SHX5480

## 2013-08-29 HISTORY — PX: TRANSTHORACIC ECHOCARDIOGRAM: SHX275

## 2013-08-29 LAB — TROPONIN I: Troponin I: <0.3 ng/mL (ref <0.30)

## 2013-08-29 LAB — BASIC METABOLIC PANEL
ANION GAP: 12 (ref 5–15)
BUN: 37 mg/dL — ABNORMAL HIGH (ref 6–23)
CALCIUM: 8.4 mg/dL (ref 8.4–10.5)
CO2: 29 mEq/L (ref 19–32)
Chloride: 98 mEq/L (ref 96–112)
Creatinine, Ser: 2.9 mg/dL — ABNORMAL HIGH (ref 0.50–1.35)
GFR, EST AFRICAN AMERICAN: 22 mL/min — AB (ref >90)
GFR, EST NON AFRICAN AMERICAN: 19 mL/min — AB (ref >90)
GLUCOSE: 118 mg/dL — AB (ref 70–99)
POTASSIUM: 4.1 meq/L (ref 3.7–5.3)
SODIUM: 139 meq/L (ref 137–147)

## 2013-08-29 SURGERY — PERMANENT PACEMAKER INSERTION
Anesthesia: LOCAL

## 2013-08-29 MED ORDER — SODIUM CHLORIDE 0.9 % IV SOLN
INTRAVENOUS | Status: DC
  Administered 2013-08-29: 15:00:00 via INTRAVENOUS

## 2013-08-29 MED ORDER — ACETAMINOPHEN 325 MG PO TABS: 325.0000 mg | ORAL_TABLET | ORAL | Status: DC | PRN

## 2013-08-29 MED ORDER — ONDANSETRON HCL 4 MG/2ML IJ SOLN: 4.0000 mg | Freq: Four times a day (QID) | INTRAMUSCULAR | Status: DC | PRN

## 2013-08-29 MED ORDER — VANCOMYCIN HCL IN DEXTROSE 1-5 GM/200ML-% IV SOLN
1000.0000 mg | Freq: Two times a day (BID) | INTRAVENOUS | Status: AC
  Administered 2013-08-30: 1000 mg via INTRAVENOUS
  Filled 2013-08-29: qty 200

## 2013-08-29 MED ORDER — HEPARIN (PORCINE) IN NACL 2-0.9 UNIT/ML-% IJ SOLN
INTRAMUSCULAR | Status: AC
  Filled 2013-08-29: qty 500

## 2013-08-29 MED ORDER — LIDOCAINE HCL (PF) 1 % IJ SOLN
INTRAMUSCULAR | Status: AC
  Filled 2013-08-29: qty 30

## 2013-08-29 MED ORDER — SODIUM CHLORIDE 0.9 % IR SOLN
80.0000 mg | Status: DC
  Filled 2013-08-29 (×2): qty 2

## 2013-08-29 MED ORDER — FUROSEMIDE 10 MG/ML IJ SOLN
INTRAMUSCULAR | Status: AC
  Filled 2013-08-29: qty 4

## 2013-08-29 MED ORDER — VANCOMYCIN HCL IN DEXTROSE 1-5 GM/200ML-% IV SOLN
1000.0000 mg | INTRAVENOUS | Status: DC
  Filled 2013-08-29 (×2): qty 200

## 2013-08-29 MED ORDER — FUROSEMIDE 10 MG/ML IJ SOLN
40.0000 mg | Freq: Once | INTRAMUSCULAR | Status: AC
  Administered 2013-08-29: 40 mg via INTRAVENOUS

## 2013-08-29 MED ORDER — MIDAZOLAM HCL 5 MG/5ML IJ SOLN
INTRAMUSCULAR | Status: AC
  Filled 2013-08-29: qty 5

## 2013-08-29 MED ORDER — FENTANYL CITRATE 0.05 MG/ML IJ SOLN
INTRAMUSCULAR | Status: AC
  Filled 2013-08-29: qty 2

## 2013-08-29 NOTE — Progress Notes (Signed)
INITIAL NUTRITION ASSESSMENT  DOCUMENTATION CODES Per approved criteria  -Not Applicable   INTERVENTION:  If diet advanced to clear liquids, Provide Resource Breeze po TID, each supplement provides 250 kcal and 9 grams of protein.   If diet is advanced from clears, Provide Ensure Complete po BID, each supplement provides 350 kcal and 13 grams of protein   Will continue to monitor.    NUTRITION DIAGNOSIS: Inadequate oral intake related to decreased appetite as evidenced by Poor Po intake of 0%.   Goal: Pt to meet >/= 90% of their estimated nutrition needs   Monitor:  PO and supplemental intake, weight, labs, I/O's   Reason for Assessment: Pt identified as at nutrition risk on the Malnutrition Screen Tool Score 2  Admitting Dx: <principal problem not specified>  ASSESSMENT:  80 yo man with a h/o HTN, PAF, LBBB, and CAD with preserved LV function who was admitted from rehab when he was found to have 2:1 AV block in the setting of LBBB. He has had severe fatigue and weakness.   PO intake: 0% Pt and family reports decreased appetite, pt feels like he has lost weight but is probably d/t loss of fluid. Per H&P, there is evidence of mild to moderate volume overload. Pt states that all he had to eat last week was liquids. States he is at his normal weight currently (at 195 lb).   Pt requests receiving Ensure supplements, will send BID when diet is advanced.  Nutrition focused physical exam shows no sign of depletion of muscle mass or body fat.  Labs reviewed:  Glucose 118 High BUN & Creatinine  Height: Ht Readings from Last 1 Encounters:  08/28/13 5\' 9"  (1.753 m)    Weight: Wt Readings from Last 1 Encounters:  08/29/13 196 lb 13.9 oz (89.3 kg)    Ideal Body Weight: 160 lb  % Ideal Body Weight: 123%  Wt Readings from Last 10 Encounters:  08/29/13 196 lb 13.9 oz (89.3 kg)  08/28/13 198 lb 4 oz (89.926 kg)  08/22/13 192 lb 8 oz (87.317 kg)  08/22/13 192 lb 8 oz  (87.317 kg)  08/15/13 192 lb 8 oz (87.317 kg)  06/19/13 191 lb (86.637 kg)  06/19/13 191 lb (86.637 kg)  05/15/13 195 lb 8 oz (88.678 kg)  02/13/13 198 lb (89.812 kg)  08/04/11 186 lb (84.369 kg)    Usual Body Weight: 195 lb  % Usual Body Weight: 100%  BMI:  Body mass index is 29.06 kg/(m^2).  Estimated Nutritional Needs: Kcal: 2100-2300 Protein: 110-120g Fluid: >1.5 L/day  Skin: intact, surgical incision, RLE and LLE edema  Diet Order: NPO  EDUCATION NEEDS: -Education not appropriate at this time   Intake/Output Summary (Last 24 hours) at 08/29/13 1221 Last data filed at 08/29/13 0916  Gross per 24 hour  Intake    360 ml  Output   2155 ml  Net  -1795 ml    Last BM: 8/28   Labs:   Recent Labs Lab 08/23/13 0413 08/25/13 0405 08/28/13 1513 08/28/13 1529 08/28/13 1936 08/29/13 0200  NA 140 136*  --  135*  --  139  K 3.8 4.1  --  4.3  --  4.1  CL 101 100  --  98  --  98  CO2 29 27  --   --   --  29  BUN 22 19  --  44*  --  37*  CREATININE 2.18* 2.34*  --  2.90* 2.70* 2.90*  CALCIUM 8.3*  8.6  --   --   --  8.4  MG  --   --  2.0  --   --   --   GLUCOSE 161* 127*  --  126*  --  118*    CBG (last 3)  No results found for this basename: GLUCAP,  in the last 72 hours  Scheduled Meds: . antiseptic oral rinse  7 mL Mouth Rinse BID  . aspirin EC  81 mg Oral Daily  . heparin  5,000 Units Subcutaneous Q8H  . isosorbide mononitrate  30 mg Oral QHS  . latanoprost  1 drop Both Eyes QHS  . loratadine  10 mg Oral Daily  . multivitamin with minerals  1 tablet Oral Daily  . pantoprazole  40 mg Oral Daily  . senna-docusate  1 tablet Oral BID  . simvastatin  20 mg Oral q1800  . sodium chloride  3 mL Intravenous Q12H    Continuous Infusions:   Past Medical History  Diagnosis Date  . GERD (gastroesophageal reflux disease)   . H/O hiatal hernia   . Arthritis     hands  . BPH (benign prostatic hypertrophy)   . History of atrial fibrillation     a. 2006.  .  Glaucoma of both eyes   . Transitional cell carcinoma of bladder FOLLOWED BY DR Gaynelle Arabian    BLADDER INSTILLATION TX'S  . Hyperlipidemia   . Hypertension   . LBBB (left bundle branch block)   . First degree heart block   . Coronary artery disease     a. 2006 s/p PCI/Stenting x 2 to the RCA;  b. 05/03/12 Cardiac CT: Patent RCA stent, nonobs dzs;  c. 01/2013 Ex MV: nl EF, no ischemia.  Marland Kitchen History of diverticulitis   . Left Renal Pelvis Cancer     a. 08/2013 s/p L robotic assisted nephro-ureterectomy.  Marland Kitchen History of kidney stones   . HTN (hypertension)   . Hyperlipidemia   . Renal insufficiency     a. Creat rose to 2.34 post-op L nephrectomy.  . Sinus bradycardia     Past Surgical History  Procedure Laterality Date  . Cystoscopy with biopsy  04/13/2011  RECURRENT TCC OF BLADDER    Procedure: CYSTOSCOPY WITH BIOPSY;  Surgeon: [name], MD;  Location: [facility];  Service: Urology;  Laterality: N/A;  Cold Cup Biopsy  . Transurethral resection of bladder tumor  12-22-2010    BLADDER CANCER  . Appendectomy  02/1988  . Cataract extraction w/ intraocular lens  implant, bilateral  2011  . Transurethral resection of bladder tumor  08/04/2011    Procedure: TRANSURETHRAL RESECTION OF BLADDER TUMOR (TURBT);  Surgeon: [name], MD;  Location: [facility];  Service: Urology;  Laterality: N/A;  . Coronary angioplasty with stent placement  2006    [facility]    X2 STENTS  TO RCA  . Cystoscopy with retrograde pyelogram, ureteroscopy and stent placement Left 06/19/2013    Procedure: CYSTOSCOPY WITH LEFT RETROGRADE PYELOGRAM, LEFT URETEROSCOPY, ureteral balloon dilatation, BIOPSY LEFT KIDNEY;  Surgeon: [name], MD;  Location: [facility];  Service: Urology;  Laterality: Left;  . Robot assited laparoscopic nephroureterectomy Left 08/22/2013    Procedure: ROBOT ASSITED LAPAROSCOPIC NEPHROURETERECTOMY;  Surgeon: [name], MD;  Location: [facility];  Service:  Urology;  Laterality: Left;  . Cystoscopy w/ ureteral stent placement Left 08/22/2013    Procedure: CYSTOSCOPY WITH RETROGRADE PYELOGRAM/URETERAL STENT PLACEMENT;  Surgeon: [name], MD;  Location: [facility];  Service: Urology;  Laterality: Left;  . Inguinal hernia repair Left 08/22/2013    Procedure: LAPAROSCOPIC INGUINAL HERNIA;  Surgeon: [name], MD;  Location: [facility];  Service: Urology;  Laterality: Left;   Clayton Bibles, MS, [place] Licensed Dietitian Nutritionist Pager: [phone]

## 2013-08-29 NOTE — Progress Notes (Signed)
  Echocardiogram 2D Echocardiogram has been performed.  Jacob [name] 08/29/2013, 11:24 AM

## 2013-08-29 NOTE — Progress Notes (Signed)
Complained of shortness of breath, lungs with scattered crackles, MD aware, lasix 40 mg iv given. Continue to monitor.

## 2013-08-29 NOTE — Progress Notes (Signed)
Not coming back from the cath lab, belongings sent to the cath. Lab.

## 2013-08-29 NOTE — CV Procedure (Signed)
SURGEON:  Cristopher Peru, MD     PREPROCEDURE DIAGNOSIS:  Symptomatic Bradycardia due to 2:1 AV block    POSTPROCEDURE DIAGNOSIS:  Same as preprocedure diagnosis     PROCEDURES:   1. Pacemaker implantation.     INTRODUCTION: Jacob [name] is a 80 y.o. male  with a history of bradycardia who presents today for pacemaker implantation.  The patient reports intermittent episodes of dizziness over the past few months.  No reversible causes have been identified.  The patient therefore presents today for pacemaker implantation.     DESCRIPTION OF PROCEDURE:  Informed written consent was obtained, and   the patient was brought to the electrophysiology lab in a fasting state.  The patient required no sedation for the procedure today.  The patients left chest was prepped and draped in the usual sterile fashion by the EP lab staff. The skin overlying the left deltopectoral region was infiltrated with lidocaine for local analgesia.  A 4-cm incision was made over the left deltopectoral region.  A left subcutaneous pacemaker pocket was fashioned using a combination of sharp and blunt dissection. Electrocautery was required to assure hemostasis.    RA/RV Lead Placement: The left axillary vein was therefore directly visualized and cannulated.  Through the left axillary vein, a Medtronic  (serial number [id number]) right atrial lead and a Medtronic (serial number [id number]) right ventricular lead were advanced with fluoroscopic visualization into the right atrial appendage and right ventricular apical septal positions respectively.  Initial atrial lead P- waves measured 2.5 mV with impedance of 495 ohms and a threshold of 1.0 V at 0.5 msec.  Right ventricular lead R-waves measured 8.1 mV with an impedance of 707 ohms and a threshold of 0.7 V at 0.5 msec.  Both leads were secured to the pectoralis fascia using #2-0 silk over the suture sleeves.   Device Placement:  The leads were then connected to a Medtronic  dual-chamber (serial number [id number]) pacemaker.  The pocket was irrigated with copious gentamicin solution.  The pacemaker was then placed into the pocket.  The pocket was then closed in 2 layers with 2.0 Vicryl suture for the subcutaneous and subcuticular layers.  Steri-Strips and a sterile dressing were then applied.  There were no early apparent complications.     CONCLUSIONS:   1. Successful implantation of a Medtronic dual-chamber pacemaker for symptomatic bradycardia due to 2:1 AV block  2. No early apparent complications.           Cristopher Peru, MD 08/29/2013 6:20 PM

## 2013-08-29 NOTE — Consult Note (Signed)
Reason for Consult:Symptomatic 2:1 AV block  Referring Physician: Dr. Geri Seminole is an 80 y.o. male.   HPI: The patient is an 80 yo man with a h/o HTN, PAF, LBBB, and CAD with preserved LV function who was admitted from rehab when he was found to have 2:1 AV block in the setting of LBBB. He has had severe fatigue and weakness. He was on non AV nodal blocking drugs and no reversible causes have been identified. His heart block has persisted. He has not had frank syncope but notes episodes of near syncope.   PMH: Past Medical History  Diagnosis Date  . GERD (gastroesophageal reflux disease)   . H/O hiatal hernia   . Arthritis     hands  . BPH (benign prostatic hypertrophy)   . History of atrial fibrillation     a. 2006.  . Glaucoma of both eyes   . Transitional cell carcinoma of bladder FOLLOWED BY DR Gaynelle Arabian    BLADDER INSTILLATION TX'S  . Hyperlipidemia   . Hypertension   . LBBB (left bundle branch block)   . First degree heart block   . Coronary artery disease     a. 2006 s/p PCI/Stenting x 2 to the RCA;  b. 05/03/12 Cardiac CT: Patent RCA stent, nonobs dzs;  c. 01/2013 Ex MV: nl EF, no ischemia.  Marland Kitchen History of diverticulitis   . Left Renal Pelvis Cancer     a. 08/2013 s/p L robotic assisted nephro-ureterectomy.  Marland Kitchen History of kidney stones   . HTN (hypertension)   . Hyperlipidemia   . Renal insufficiency     a. Creat rose to 2.34 post-op L nephrectomy.  . Sinus bradycardia     PSHX: Past Surgical History  Procedure Laterality Date  . Cystoscopy with biopsy  04/13/2011  RECURRENT TCC OF BLADDER    Procedure: CYSTOSCOPY WITH BIOPSY;  Surgeon: [name], MD;  Location: [facility];  Service: Urology;  Laterality: N/A;  Cold Cup Biopsy  . Transurethral resection of bladder tumor  12-22-2010    BLADDER CANCER  . Appendectomy  02/1988  . Cataract extraction w/ intraocular lens  implant, bilateral  2011  . Transurethral resection of bladder tumor  08/04/2011     Procedure: TRANSURETHRAL RESECTION OF BLADDER TUMOR (TURBT);  Surgeon: [name], MD;  Location: [facility];  Service: Urology;  Laterality: N/A;  . Coronary angioplasty with stent placement  2006    [facility]    X2 STENTS  TO RCA  . Cystoscopy with retrograde pyelogram, ureteroscopy and stent placement Left 06/19/2013    Procedure: CYSTOSCOPY WITH LEFT RETROGRADE PYELOGRAM, LEFT URETEROSCOPY, ureteral balloon dilatation, BIOPSY LEFT KIDNEY;  Surgeon: [name], MD;  Location: [facility];  Service: Urology;  Laterality: Left;  . Robot assited laparoscopic nephroureterectomy Left 08/22/2013    Procedure: ROBOT ASSITED LAPAROSCOPIC NEPHROURETERECTOMY;  Surgeon: [name], MD;  Location: [facility];  Service: Urology;  Laterality: Left;  . Cystoscopy w/ ureteral stent placement Left 08/22/2013    Procedure: CYSTOSCOPY WITH RETROGRADE PYELOGRAM/URETERAL STENT PLACEMENT;  Surgeon: [name], MD;  Location: [facility];  Service: Urology;  Laterality: Left;  . Inguinal hernia repair Left 08/22/2013    Procedure: LAPAROSCOPIC INGUINAL HERNIA;  Surgeon: [name], MD;  Location: [facility];  Service: Urology;  Laterality: Left;    FAMHX: Family History  Problem Relation Age of Onset  . Heart attack Mother   . Heart attack Father  Social History:  reports that he quit smoking about 12 years ago. His smoking use included Cigarettes. He has a 50 pack-year smoking history. He has never used smokeless tobacco. He reports that he does not drink alcohol or use illicit drugs.  Allergies:  Allergies  Allergen Reactions  . Penicillins Hives    BLISTERS    Medications: reviewed  Dg Chest Portable 1 View  08/28/2013   CLINICAL DATA:  Shortness of breath.  EXAM: PORTABLE CHEST - 1 VIEW  COMPARISON:  One-view chest 08/25/2013  FINDINGS: The heart is mildly enlarged. Pneumoperitoneum a has resolved. Mild bibasilar airspace disease likely reflects  atelectasis. Aeration has improved. Defibrillator pads are in place.  IMPRESSION: 1. Borderline cardiomegaly without failure. 2. Interval resolution of the pneumoperitoneum. 3. Persistent bibasilar airspace disease, likely reflecting atelectasis. Overall aeration is improved.   Electronically Signed   By: Lawrence Santiago M.D.   On: 08/28/2013 16:16    ROS  As stated in the HPI and negative for all other systems.  Physical Exam  Vitals:Blood pressure 152/32, pulse 39, temperature 98.4 F (36.9 C), temperature source Oral, resp. rate 17, height 5\' 9"  (1.753 m), weight 196 lb 13.9 oz (89.3 kg), SpO2 95.00%.  stable appearing 80 yo man,NAD HEENT: Unremarkable Neck:  No JVD, no thyromegally Back:  No CVA tenderness Lungs:  Clear except for basilar rales HEART:  Regular rate rhythm, no murmurs, no rubs, no clicks Abd:  soft, positive bowel sounds, no organomegally, no rebound, no guarding Ext:  2 plus pulses, trace edema, no cyanosis, no clubbing Skin:  No rashes no nodules Neuro:  CN II through XII intact, motor grossly intact  ECG - nsr with 2:1 AV block and incomplete LBBB  Assessment/Plan: 1. Symptomatic mobitz 2 second degree AV block 2. CAD Rec: as there is no reversible cause of his heart block, I have recommended proceeding with insertion of a DDD PM. The risks/benefits/goals/expectations of the procedure have been discussed with the patient and he wishes to proceed.  Carleene Overlie TaylorMD 08/29/2013, 2:30 PM

## 2013-08-29 NOTE — Care Management Note (Addendum)
    Page 1 of 1   09/01/2013     5:44:27 PM CARE MANAGEMENT NOTE 09/01/2013  Patient:  WAYLYN, TENBRINK   Account Number:  [id number]  Date Initiated:  08/29/2013  Documentation initiated by:  Elissa Hefty  Subjective/Objective Assessment:   adm w brady, heart block     Action/Plan:   lived alone prior to last hops but had gone to snf for rehab post last adm   Anticipated DC Date:  09/01/2013   Anticipated DC Plan:  [place] referral  Clinical Social Worker      DC Planning Services  CM consult      Choice offered to / List presented to:             Status of service:  Completed, signed off Medicare Important Message given?  YES (If response is "NO", the following Medicare IM given date fields will be blank) Date Medicare IM given:  09/01/2013 Medicare IM given by:  Arpita Fentress Date Additional Medicare IM given:   Additional Medicare IM given by:    Discharge Disposition:  [place]  Per UR Regulation:  Reviewed for med. necessity/level of care/duration of stay  If discussed at [place] of Stay Meetings, dates discussed:    Comments:  09/01/13 Ellan Lambert, RN, BSN [phone] Pt from [place] prior to admission.  Pt discharging back to SNF today, per CSW arrangements.

## 2013-08-29 NOTE — Progress Notes (Signed)
To the cath lab.by bed , vanco sent with the pt.

## 2013-08-30 ENCOUNTER — Inpatient Hospital Stay (HOSPITAL_COMMUNITY): Payer: Medicare Other

## 2013-08-30 LAB — BASIC METABOLIC PANEL
ANION GAP: 11 (ref 5–15)
BUN: 34 mg/dL — AB (ref 6–23)
CALCIUM: 8.8 mg/dL (ref 8.4–10.5)
CO2: 31 mEq/L (ref 19–32)
CREATININE: 2.62 mg/dL — AB (ref 0.50–1.35)
Chloride: 96 mEq/L (ref 96–112)
GFR calc Af Amer: 24 mL/min — ABNORMAL LOW (ref >90)
GFR, EST NON AFRICAN AMERICAN: 21 mL/min — AB (ref >90)
Glucose, Bld: 110 mg/dL — ABNORMAL HIGH (ref 70–99)
Potassium: 4.3 mEq/L (ref 3.7–5.3)
Sodium: 138 mEq/L (ref 137–147)

## 2013-08-30 MED ORDER — YOU HAVE A PACEMAKER BOOK
Freq: Once | Status: AC
  Administered 2013-08-30: 05:00:00
  Filled 2013-08-30: qty 1

## 2013-08-30 NOTE — Plan of Care (Signed)
Problem: Phase I Progression Outcomes Goal: Voiding-avoid urinary catheter unless indicated Outcome: Not Met (add Reason) Not to be removed until sees Urologist on Tuesday.

## 2013-08-30 NOTE — Progress Notes (Signed)
Patient ID: ERMAL HABERER, male   DOB: 03/14/1928, 80 y.o.   MRN: [id number]   Patient Name: Jacob [name] Date of Encounter: 08/30/2013     Active Problems:   Bradycardia   Symptomatic bradycardia    SUBJECTIVE  " I slept the best I have slept since my surgery". Denies chest pain or sob. Minimal incisional tenderness.  CURRENT MEDS . antiseptic oral rinse  7 mL Mouth Rinse BID  . aspirin EC  81 mg Oral Daily  . heparin  5,000 Units Subcutaneous Q8H  . isosorbide mononitrate  30 mg Oral QHS  . latanoprost  1 drop Both Eyes QHS  . loratadine  10 mg Oral Daily  . multivitamin with minerals  1 tablet Oral Daily  . pantoprazole  40 mg Oral Daily  . senna-docusate  1 tablet Oral BID  . simvastatin  20 mg Oral q1800  . sodium chloride  3 mL Intravenous Q12H    OBJECTIVE  Filed Vitals:   08/29/13 2006 08/30/13 0005 08/30/13 0414 08/30/13 0425  BP: 164/49 130/45 120/47   Pulse: 65 60 64   Temp: 98.2 F (36.8 C) 98.2 F (36.8 C) 98 F (36.7 C)   TempSrc: Oral Oral Oral   Resp: 18 18 18    Height:      Weight:  198 lb 10.2 oz (90.1 kg)  198 lb 10.2 oz (90.1 kg)  SpO2: 93% 95% 94%     Intake/Output Summary (Last 24 hours) at 08/30/13 0755 Last data filed at 08/30/13 0415  Gross per 24 hour  Intake    590 ml  Output   2750 ml  Net  -2160 ml   Filed Weights   08/29/13 0500 08/30/13 0005 08/30/13 0425  Weight: 196 lb 13.9 oz (89.3 kg) 198 lb 10.2 oz (90.1 kg) 198 lb 10.2 oz (90.1 kg)    PHYSICAL EXAM  General: Pleasant, 80 yo man, NAD. Neuro: Alert and oriented X 3. Moves all extremities spontaneously. HEENT:  Normal  Neck: Supple without bruits or JVD. Lungs:  Resp regular and unlabored, CTA. Incision with no hematoma. Heart: RRR no s3, s4, or murmurs. Abdomen: Soft, non-tender, non-distended, BS + x 4.  Extremities: No clubbing, cyanosis or edema. DP/PT/Radials 2+ and equal bilaterally.  Accessory Clinical Findings  CBC  Recent Labs  08/28/13 1513  08/28/13 1529 08/28/13 1936  WBC 6.7  --  6.8  HGB 11.0* 11.2* 10.5*  HCT 33.0* 33.0* 32.1*  MCV 94.0  --  97.0  PLT 194  --  427   Basic Metabolic Panel  Recent Labs  08/28/13 1513  08/29/13 0200 08/30/13 0435  NA  --   < > 139 138  K  --   < > 4.1 4.3  CL  --   < > 98 96  CO2  --   --  29 31  GLUCOSE  --   < > 118* 110*  BUN  --   < > 37* 34*  CREATININE  --   < > 2.90* 2.62*  CALCIUM  --   --  8.4 8.8  MG 2.0  --   --   --   < > = values in this interval not displayed. Liver Function Tests No results found for this basename: AST, ALT, ALKPHOS, BILITOT, PROT, ALBUMIN,  in the last 72 hours No results found for this basename: LIPASE, AMYLASE,  in the last 72 hours Cardiac Enzymes  Recent Labs  08/28/13 1936 08/29/13 0200  08/29/13 0750  TROPONINI <0.30 <0.30 <0.30   BNP No components found with this basename: POCBNP,  D-Dimer No results found for this basename: DDIMER,  in the last 72 hours Hemoglobin A1C No results found for this basename: HGBA1C,  in the last 72 hours Fasting Lipid Panel No results found for this basename: CHOL, HDL, LDLCALC, TRIG, CHOLHDL, LDLDIRECT,  in the last 72 hours Thyroid Function Tests  Recent Labs  08/28/13 1936  TSH 1.750    TELE  NSR with p synchronous ventricular pacing  Radiology/Studies  Dg Chest 2 View  08/15/2013   CLINICAL DATA:  Preop for left nephrectomy  EXAM: CHEST  2 VIEW  COMPARISON:  08/04/2011  FINDINGS: Cardiomediastinal silhouette is stable. No acute infiltrate or pleural effusion. No pulmonary edema. Mild elevation of the right hemidiaphragm. Degenerative changes thoracic spine.  IMPRESSION: No active cardiopulmonary disease.   Electronically Signed   By: Lahoma Crocker M.D.   On: 08/15/2013 11:15   Dg Retrograde Pyelogram  08/22/2013   CLINICAL DATA:  Cancer of the left renal pelvis.  EXAM: RETROGRADE PYELOGRAM  COMPARISON:  CT scan dated 05/27/2013  FINDINGS: Left retrograde pyelogram demonstrates a mass  obstructing the left upper pole infundibulum. The rest of the left renal collecting system appears normal including the entire left ureter.  IMPRESSION: Mass obstructing the upper pole infundibulum of the left kidney.   Electronically Signed   By: Rozetta Nunnery M.D.   On: 08/22/2013 08:44   Dg Chest Portable 1 View  08/28/2013   CLINICAL DATA:  Shortness of breath.  EXAM: PORTABLE CHEST - 1 VIEW  COMPARISON:  One-view chest 08/25/2013  FINDINGS: The heart is mildly enlarged. Pneumoperitoneum a has resolved. Mild bibasilar airspace disease likely reflects atelectasis. Aeration has improved. Defibrillator pads are in place.  IMPRESSION: 1. Borderline cardiomegaly without failure. 2. Interval resolution of the pneumoperitoneum. 3. Persistent bibasilar airspace disease, likely reflecting atelectasis. Overall aeration is improved.   Electronically Signed   By: Lawrence Santiago M.D.   On: 08/28/2013 16:16   Dg Chest Port 1v Same Day  08/25/2013   CLINICAL DATA:  Shortness of breath.  EXAM: PORTABLE CHEST - 1 VIEW SAME DAY  COMPARISON:  August 15, 2013.  FINDINGS: Stable cardiomediastinal silhouette. Mildly increased interstitial densities are noted throughout both lungs which may represent pulmonary edema. No pneumothorax or significant pleural effusion is noted. Pneumoperitoneum is noted under both hemidiaphragms consistent with a history of recent abdominal surgery.  IMPRESSION: Mildly increased interstitial densities are noted throughout both lungs concerning for possible pulmonary edema.   Electronically Signed   By: Sabino Dick M.D.   On: 08/25/2013 11:52    ASSESSMENT AND PLAN  1. Symptomatic 2:1 AV block 2. S/p PPM with normal device function 3. Disposition: the patient was at rehab when he was admitted with symptomatic bradycardia. We have called rehab in [place] and they are not able to except the patient back today but will accept back to continue his rehab on Monday. We will transfer to tele and  plan discharge Monday morning.  Gregg Taylor,M.D.  Gregg Taylor,M.D.  08/30/2013 7:55 AM

## 2013-08-31 DIAGNOSIS — N289 Disorder of kidney and ureter, unspecified: Secondary | ICD-10-CM

## 2013-08-31 LAB — BASIC METABOLIC PANEL
Anion gap: 12 (ref 5–15)
BUN: 29 mg/dL — AB (ref 6–23)
CALCIUM: 8.9 mg/dL (ref 8.4–10.5)
CO2: 30 mEq/L (ref 19–32)
Chloride: 94 mEq/L — ABNORMAL LOW (ref 96–112)
Creatinine, Ser: 2.35 mg/dL — ABNORMAL HIGH (ref 0.50–1.35)
GFR calc Af Amer: 28 mL/min — ABNORMAL LOW (ref >90)
GFR, EST NON AFRICAN AMERICAN: 24 mL/min — AB (ref >90)
GLUCOSE: 101 mg/dL — AB (ref 70–99)
Potassium: 4 mEq/L (ref 3.7–5.3)
Sodium: 136 mEq/L — ABNORMAL LOW (ref 137–147)

## 2013-08-31 NOTE — Progress Notes (Signed)
IV not working and painful.  Pt said he is going home tomorrow and does not want to be stuck.  Payton Emerald, RN

## 2013-08-31 NOTE — Progress Notes (Signed)
The patient was seen and examined, and I agree with the assessment and plan as documented above. Pt doing well after pacemaker implantation. Denies symptoms of significant chest pain/shortness of breath. Plan to discharge to rehab facility in [place] on 8/31.

## 2013-08-31 NOTE — Progress Notes (Signed)
Patient Profile: 80 y/o male admitted 08/29/13 for PPM insertion for symptomatic high-grade AV block. Now awaiting placement at SNF.   Subjective: Feels great. No complaints. Sitting in chair reading newspaper. Denies chest pain and dyspnea.    Objective: Vital signs in last 24 hours: Temp:  [97 F (36.1 C)-99 F (37.2 C)] 98.8 F (37.1 C) (08/30 0504) Pulse Rate:  [63-68] 63 (08/30 0504) Resp:  [18-20] 19 (08/30 0504) BP: (137-152)/(47-55) 137/55 mmHg (08/30 0504) SpO2:  [92 %-94 %] 93 % (08/30 0504) Weight:  [188 lb 7.9 oz (85.5 kg)] 188 lb 7.9 oz (85.5 kg) (08/30 0504) Last BM Date: 08/29/13  Intake/Output from previous day: 08/29 0701 - 08/30 0700 In: 360 [P.O.:360] Out: 1771 [Urine:1770; Stool:1] Intake/Output this shift:    Medications Current Facility-Administered Medications  Medication Dose Route Frequency Provider Last Rate Last Dose  . 0.9 %  sodium chloride infusion  250 mL Intravenous PRN Rogelia Mire, NP      . acetaminophen (TYLENOL) tablet 325-650 mg  325-650 mg Oral Q4H PRN [name], MD      . antiseptic oral rinse (CPC / CETYLPYRIDINIUM CHLORIDE 0.05%) solution 7 mL  7 mL Mouth Rinse BID Wellington Hampshire, MD   7 mL at 08/30/13 0926  . aspirin EC tablet 81 mg  81 mg Oral Daily Rogelia Mire, NP   81 mg at 08/30/13 0924  . heparin injection 5,000 Units  5,000 Units Subcutaneous Q8H Wellington Hampshire, MD   5,000 Units at 08/31/13 0631  . isosorbide mononitrate (IMDUR) 24 hr tablet 30 mg  30 mg Oral QHS Rogelia Mire, NP   30 mg at 08/30/13 2228  . latanoprost (XALATAN) 0.005 % ophthalmic solution 1 drop  1 drop Both Eyes QHS Rogelia Mire, NP   1 drop at 08/30/13 2228  . loratadine (CLARITIN) tablet 10 mg  10 mg Oral Daily Rogelia Mire, NP   10 mg at 08/30/13 0924  . multivitamin with minerals tablet 1 tablet  1 tablet Oral Daily Wellington Hampshire, MD   1 tablet at 08/30/13 [phone]  . ondansetron (ZOFRAN) injection 4 mg  4 mg  Intravenous Q6H PRN Rogelia Mire, NP      . pantoprazole (PROTONIX) EC tablet 40 mg  40 mg Oral Daily Rogelia Mire, NP   40 mg at 08/30/13 0824  . senna-docusate (Senokot-S) tablet 1 tablet  1 tablet Oral BID Rogelia Mire, NP   1 tablet at 08/30/13 2228  . simvastatin (ZOCOR) tablet 20 mg  20 mg Oral q1800 Rogelia Mire, NP   20 mg at 08/30/13 1714  . sodium chloride 0.9 % injection 3 mL  3 mL Intravenous Q12H Rogelia Mire, NP   3 mL at 08/30/13 2228  . sodium chloride 0.9 % injection 3 mL  3 mL Intravenous PRN Rogelia Mire, NP      . traMADol Veatrice Bourbon) tablet 50 mg  50 mg Oral Q6H PRN Rogelia Mire, NP   50 mg at 08/30/13 2233    PE: General appearance: alert, cooperative and no distress Lungs: clear to auscultation bilaterally and slight bilateral wheezing Heart: regular rate and rhythm Extremities: trace bilateral pretibial edema Pulses: 2+ and symmetric Skin: warm and dry Neurologic: Grossly normal  Lab Results:   Recent Labs  08/28/13 1513 08/28/13 1529 08/28/13 1936  WBC 6.7  --  6.8  HGB 11.0* 11.2* 10.5*  HCT 33.0* 33.0* 32.1*  PLT 194  --  177   BMET  Recent Labs  08/29/13 0200 08/30/13 0435 08/31/13 0530  NA 139 138 136*  K 4.1 4.3 4.0  CL 98 96 94*  CO2 29 31 30   GLUCOSE 118* 110* 101*  BUN 37* 34* 29*  CREATININE 2.90* 2.62* 2.35*  CALCIUM 8.4 8.8 8.9     Assessment/Plan  Active Problems:   Bradycardia   Symptomatic bradycardia  1. S/p PPM insertion for symptomatic bradycardia: stable. Denies CP and SOB. Post-op CXR yesterday demonstrated proper lead placement w/o evidence for pneumothorax. Device interrogation yesterday revealed normal functioning. Telemetry shows paced rhythm. HR in the upper 60s. Incision site is covered in dressing with no visible drainage.   2. Renal Insufficiency: SCr continues to trend downward. S/p left nephrectomy 08/22/13 secondary to malignancy. Scr. now at 2.35 (2.90 on  admit).   Dispo: Awaiting on bed at SNF. Hopeful discharge on Monday.      LOS: 3 days    Raushanah Osmundson M. Rosita Fire, PA-C 08/31/2013 8:30 AM

## 2013-09-01 ENCOUNTER — Encounter: Payer: Self-pay | Admitting: Internal Medicine

## 2013-09-01 DIAGNOSIS — I498 Other specified cardiac arrhythmias: Secondary | ICD-10-CM

## 2013-09-01 MED ORDER — AMLODIPINE BESYLATE 5 MG PO TABS
5.0000 mg | ORAL_TABLET | Freq: Every day | ORAL | Status: DC
  Administered 2013-09-01: 5 mg via ORAL
  Filled 2013-09-01: qty 1

## 2013-09-01 MED ORDER — AMLODIPINE BESYLATE 5 MG PO TABS: 5.0000 mg | ORAL_TABLET | Freq: Every day | ORAL | Status: DC

## 2013-09-01 NOTE — Discharge Instructions (Signed)
° ° °  Supplemental Discharge Instructions for  Pacemaker/Defibrillator Patients  Activity No heavy lifting or vigorous activity with your left/right arm for 6 to 8 weeks.  Do not raise your left/right arm above your head for one week.  Gradually raise your affected arm as drawn below.           __  NO DRIVING until seen in follow-up (7-10 days)  WOUND CARE   Keep the wound area clean and dry.  Do not get this area wet for one week. No showers for one week; The tape/steri-strips on your wound will fall off; do not pull them off.  No bandage is needed on the site.  DO  NOT apply any creams, oils, or ointments to the wound area.   If you notice any drainage or discharge from the wound, any swelling or bruising at the site, or you develop a fever > 101? F after you are discharged home, call the office at once.  Special Instructions   You are still able to use cellular telephones; use the ear opposite the side where you have your pacemaker/defibrillator.  Avoid carrying your cellular phone near your device.   When traveling through airports, show security personnel your identification card to avoid being screened in the metal detectors.  Ask the security personnel to use the hand wand.   Avoid arc welding equipment, MRI testing (magnetic resonance imaging), TENS units (transcutaneous nerve stimulators).  Call the office for questions about other devices.   Avoid electrical appliances that are in poor condition or are not properly grounded.   Microwave ovens are safe to be near or to operate.  Additional information for defibrillator patients should your device go off:   If your device goes off ONCE and you feel fine afterward, notify the device clinic nurses.   If your device goes off ONCE and you do not feel well afterward, call 911.   If your device goes off TWICE, call 911.   If your device goes off THREE times in one day, call 911.  DO NOT DRIVE YOURSELF OR A FAMILY MEMBER WITH A  DEFIBRILLATOR TO THE HOSPITAL--CALL 911.

## 2013-09-01 NOTE — Progress Notes (Signed)
Patient ID: Jacob [name], male   DOB: 04-05-1928, 80 y.o.   MRN: [id number]   Patient Name: Jacob [name] Date of Encounter: 09/01/2013     Active Problems:   Bradycardia   Symptomatic bradycardia    SUBJECTIVE  Denies chest pain or sob. Minimal incisional tenderness.  CURRENT MEDS . antiseptic oral rinse  7 mL Mouth Rinse BID  . aspirin EC  81 mg Oral Daily  . heparin  5,000 Units Subcutaneous Q8H  . isosorbide mononitrate  30 mg Oral QHS  . latanoprost  1 drop Both Eyes QHS  . loratadine  10 mg Oral Daily  . multivitamin with minerals  1 tablet Oral Daily  . pantoprazole  40 mg Oral Daily  . senna-docusate  1 tablet Oral BID  . simvastatin  20 mg Oral q1800  . sodium chloride  3 mL Intravenous Q12H    OBJECTIVE  Filed Vitals:   08/31/13 0504 08/31/13 1315 08/31/13 2015 09/01/13 0619  BP: 137/55 154/53 142/70 171/60  Pulse: 63 66 66 73  Temp: 98.8 F (37.1 C) 98.6 F (37 C) 98.4 F (36.9 C) 98.7 F (37.1 C)  TempSrc: Oral Oral Oral Oral  Resp: 19 18 18 19   Height:      Weight: 188 lb 7.9 oz (85.5 kg)   185 lb 10 oz (84.2 kg)  SpO2: 93% 94% 93% 92%    Intake/Output Summary (Last 24 hours) at 09/01/13 0748 Last data filed at 09/01/13 6381  Gross per 24 hour  Intake    483 ml  Output   1750 ml  Net  -1267 ml   Filed Weights   08/30/13 0425 08/31/13 0504 09/01/13 0619  Weight: 198 lb 10.2 oz (90.1 kg) 188 lb 7.9 oz (85.5 kg) 185 lb 10 oz (84.2 kg)    PHYSICAL EXAM  General: Pleasant, 80 yo man, NAD. Neuro: Alert and oriented X 3. Moves all extremities spontaneously. HEENT:  Normal  Neck: Supple without bruits or JVD. Lungs:  Resp regular and unlabored, CTA. Incision with no hematoma. Heart: RRR no s3, s4, or murmurs. Abdomen: Soft, non-tender, non-distended, BS + x 4.  Extremities: No clubbing, cyanosis or edema. DP/PT/Radials 2+ and equal bilaterally.  Accessory Clinical Findings  CBC No results found for this basename: WBC, NEUTROABS,  HGB, HCT, MCV, PLT,  in the last 72 hours Basic Metabolic Panel  Recent Labs  08/30/13 0435 08/31/13 0530  NA 138 136*  K 4.3 4.0  CL 96 94*  CO2 31 30  GLUCOSE 110* 101*  BUN 34* 29*  CREATININE 2.62* 2.35*  CALCIUM 8.8 8.9   Liver Function Tests No results found for this basename: AST, ALT, ALKPHOS, BILITOT, PROT, ALBUMIN,  in the last 72 hours No results found for this basename: LIPASE, AMYLASE,  in the last 72 hours Cardiac Enzymes  Recent Labs  08/29/13 0750  TROPONINI <0.30   TELE  NSR with p synchronous ventricular pacing  ASSESSMENT AND PLAN  1. Symptomatic 2:1 AV block Doing well s/p PPM  2. CRI Improving creatinine  Plan to transfer back to snf today Routine wound care and device follow-up with Dr Noreene Larsson MD  09/01/2013 7:48 AM

## 2013-09-01 NOTE — Discharge Summary (Signed)
Physician Discharge Summary  Patient ID: Jacob [name] MRN: [id number] DOB/AGE: 09/20/1928 80 y.o.  Primary Cardiologist: Dr. Fletcher Anon Electrophysiologist: Dr. Lovena Le  Admit date: 08/28/2013 Discharge date: 09/01/2013  Admission Diagnoses: Symptomatic Bradycardia  Discharge Diagnoses:  Active Problems:   Bradycardia   Symptomatic bradycardia   Discharged Condition: Stable  Patient Profile: 80 y/o male admitted for symptomatic bradycardia and a/c diastolic CHF   HPI: The patient is a 80 y/o male, followed by Dr. Fletcher Anon. He has a h/o nl LV fxn and CAD s/p PCI/stenting of the RCA x 2 in 2006 @ [place]. His last myoview was earlier this year and was non-ischemic. 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 Post-op course was complicated by mild volume overload requiring IV lasix. Also, he developed renal insuff, bumping his creat to 2.34. He was very weak and deconditioned and was discharged to [facility] for rehabilitation. He was discharged on O2. Despite his renal insuff, he was ordered to resume HCTZ and losartan.  He was seen in clinic on 08/28/13 and complained of fatigue and dyspnea as well as intermittent lightheadedness with position changes. He denied chest pain but noted early satiety, weight gain, mild lower extremity edema and increasing abdominal girth. Weight had increased to 200 lb (dry weight 192 lb). In the office an EKG demonstrated 2:1 heart block with rates in the 30s and 40s. He was admitted by Dr. Fletcher Anon from the office to [facility] for treatment for symptomatic bradycardia and a/c diastolic HF.   Hospital Course: The patient was admitted to stepdown. Pacer pads were placed. W/U revealed no reversible causes.  Review of his medications did not reveal any AV nodal blocking agents. TSH was WNL as was K and Mg. Troponins were negative x 3. BNP was slightly elevated at 970.2. SCr increased to 2.9.  Subsequently, his HCTZ and losartan were both held. He was given IV Lasix and had good diuresis. Repeat 2D echo demonstarted normal LVF with EF at 60-65%. Wall motion was normal w/o abnormality. Grade I diastolic dysfunction was noted. Electrophysiology was consulted for recommendations regarding his AV block. He was evaluated by Dr. Lovena Le who recommended PPM insertion.  On 08/29/2013 he underwent successful implantation of a Medtronic dual-chamber PPM. This was performed by Dr. Lovena Le. He tolerated the procedure well and left the OR in stable condition. There were no post-op complications. He denied chest pain and dyspnea. On post-op day 1, a CXR was obtained and demonstrated proper lead placement w/o evidence for pneumothorax. Device interrogation revealed normal device functioning. His incision site was stable and w/o complications.   His hospitalization was prolonged due to placement at SNF. During this time he remained stable, although his BP increased while off of his HCTZ and losartan. His SCr remained elevated but improved slowly down to 2.35 day of discharge. He was placed on 5 mg of Norvasc for better BP control. His home dose of Imdur was also continued.   He was last seen and examined by Dr. Rayann Heman who determined he was stable for discharge back to SNF. He was provided detailed instructions regarding proper wound care and activity restrictions/limitations. He will be followed in the [facility] in 7-10 days for wound check. He will f/u with Dr. Lovena Le for pacer check in 90s days.    Consults: Electrophysiology  CV Procedure: PPM insertion 08/29/13  DESCRIPTION OF PROCEDURE: Informed written consent was obtained, and the patient was brought  to the electrophysiology lab in a fasting state. The patient required no sedation for the procedure today. The patients left chest was prepped and draped in the usual sterile fashion by the EP lab staff. The skin overlying the left deltopectoral region  was infiltrated with lidocaine for local analgesia. A 4-cm incision was made over the left deltopectoral region. A left subcutaneous pacemaker pocket was fashioned using a combination of sharp and blunt dissection. Electrocautery was required to assure hemostasis.  RA/RV Lead Placement:  The left axillary vein was therefore directly visualized and cannulated. Through the left axillary vein, a Medtronic (serial number [id number]) right atrial lead and a Medtronic (serial number [id number]) right ventricular lead were advanced with fluoroscopic visualization into the right atrial appendage and right ventricular apical septal positions respectively. Initial atrial lead P- waves measured 2.5 mV with impedance of 495 ohms and a threshold of 1.0 V at 0.5 msec. Right ventricular lead R-waves measured 8.1 mV with an impedance of 707 ohms and a threshold of 0.7 V at 0.5 msec. Both leads were secured to the pectoralis fascia using #2-0 silk over the suture sleeves.  Device Placement:  The leads were then connected to a Medtronic dual-chamber (serial number [id number]) pacemaker. The pocket was irrigated with copious gentamicin solution. The pacemaker was then placed into the pocket. The pocket was then closed in 2 layers with 2.0 Vicryl suture for the subcutaneous and subcuticular layers. Steri-Strips and a sterile dressing were then applied. There were no early apparent complications.    Significant Diagnostic Studies:   2D echo 08/29/13 Study Conclusions  - Left ventricle: The cavity size was normal. Wall thickness was increased in a pattern of mild LVH. Systolic function was normal. The estimated ejection fraction was in the range of 60% to 65%. Wall motion was normal; there were no regional wall motion abnormalities. Doppler parameters are consistent with abnormal left ventricular relaxation (grade 1 diastolic dysfunction). - Mitral valve: There was mild regurgitation. - Left atrium: The atrium was  mildly dilated. - Right atrium: The atrium was mildly dilated. - Tricuspid valve: There was moderate regurgitation. - Pulmonary arteries: PA peak pressure: 51 mm Hg (S).  Impressions:  - Rhythm appears to be NSR with 2:1 AV block.   Post-op CXR 08/30/13 FINDINGS:  Cardiac shadow is stable. Two lead pacing device is now seen. No  pneumothorax is noted. The lungs are well aerated bilaterally. Mild  persistent right basilar atelectasis is seen.  IMPRESSION:  No pneumothorax following pacer placement.    Treatments: See Hospital Course  Discharge Exam: Blood pressure 171/60, pulse 73, temperature 98.7 F (37.1 C), temperature source Oral, resp. rate 19, height 5\' 9"  (1.753 m), weight 185 lb 10 oz (84.2 kg), SpO2 92.00%.   Disposition: 03-Skilled Nursing Facility      Discharge Instructions   Diet - low sodium heart healthy    Complete by:  As directed      Increase activity slowly    Complete by:  As directed             Medication List    STOP taking these medications       hydrochlorothiazide 25 MG tablet  Commonly known as:  HYDRODIURIL     losartan 50 MG tablet  Commonly known as:  COZAAR      TAKE these medications       amLODipine 5 MG tablet  Commonly known as:  NORVASC  Take 1 tablet (5 mg total) by mouth daily.  aspirin 81 MG tablet  Take 81 mg by mouth daily.     cetirizine 10 MG tablet  Commonly known as:  ZYRTEC  - Take 10 mg by mouth every morning. ALLERGIES  -      isosorbide mononitrate 30 MG 24 hr tablet  Commonly known as:  IMDUR  Take 30 mg by mouth at bedtime.     latanoprost 0.005 % ophthalmic solution  Commonly known as:  XALATAN  Place 1 drop into both eyes at bedtime.     lovastatin 20 MG tablet  Commonly known as:  MEVACOR  Take 20 mg by mouth at bedtime.     multivitamins ther. w/minerals Tabs tablet  Take 1 tablet by mouth daily.     OMEGA 3 PO  Take 1,200 mg by mouth 2 (two) times daily.     omeprazole 20 MG  capsule  Commonly known as:  PRILOSEC  Take 20 mg by mouth daily.     ranolazine 500 MG 12 hr tablet  Commonly known as:  RANEXA  Take 500 mg by mouth 2 (two) times daily.     senna-docusate 8.6-50 MG per tablet  Commonly known as:  Senokot-S  Take 1 tablet by mouth 2 (two) times daily. While taking pain meds to prevent constipation     traMADol 50 MG tablet  Commonly known as:  ULTRAM  Take 1 tablet (50 mg total) by mouth every 6 (six) hours as needed for moderate pain or severe pain. Post-operatively.       Follow-up Information   Follow up with [facility] On 09/11/2013. (11:00 am for pacemaker wound check )    Contact information:   [place] [address] [phone]      Follow up with Cristopher Peru, MD On 12/01/2013. (11:45 am (90 day pacemaker check))    Specialty:  Cardiology   Contact information:   [address] [address] [phone]       Follow up with Lyda Jester, PA-C On 09/29/2013. (10:30 am (general cardiology follow-up with Dr. Tyrell Antonio PA))    Contact information:   [address] [place] [address] [phone]    TIME SPENT ON DISCHARGE, INCLUDING PHYSICIAN TIME: >30 MINUTES  Signed: Lyda Jester 09/01/2013, 9:09 AM  Thompson Grayer MD

## 2013-09-01 NOTE — Progress Notes (Signed)
Report give to [facility] facility. Discharge instructions given. Pt verbalized understanding.

## 2013-09-01 NOTE — Clinical Social Work Note (Signed)
Clinical Social Worker facilitated patient discharge including contacting patient family and facility to confirm patient discharge plans.  Clinical information faxed to facility and family agreeable with plan.  CSW arranged transport via patient brother to [facility].  RN to call report prior to discharge.  Clinical Social Worker will sign off for now as social work intervention is no longer needed. Please consult [place] again if new need arises.  Barbette Or, [place]

## 2013-09-01 NOTE — Progress Notes (Signed)
Note/chart reviewed. Agree with note.   Ketan Renz RD, LDN, CNSC [phone] Pager [phone] After Hours Pager   

## 2013-09-02 ENCOUNTER — Encounter: Payer: Self-pay | Admitting: Internal Medicine

## 2013-09-02 NOTE — Clinical Social Work Note (Signed)
Clinical Social Work Department BRIEF PSYCHOSOCIAL ASSESSMENT 09/02/2013  Patient:  Jacob [name], Jacob [name]     Account Number:  [id number]     Admit date:  08/28/2013  Clinical Social Worker:  Myles Lipps  Date/Time:  09/01/2013 03:00 PM  Referred by:  Care Management  Date Referred:  09/01/2013 Referred for  SNF Placement   Other Referral:   Interview type:  Patient Other interview type:   Patient brother and sister in law at bedside    PSYCHOSOCIAL DATA Living Status:  [place] Admitted from facility:  [place] Level of care:  [place] Primary support name:  Ruslan, Mccabe  [phone] Primary support relationship to patient:  [place] Degree of support available:   Strong    CURRENT CONCERNS Current Concerns  Post-Acute Placement   Other Concerns:    SOCIAL WORK ASSESSMENT / PLAN Clinical Social Worker met with patient and family at bedside to offer support and discuss patient plans at discharge.  Patient states that he is from [facility] and would like to return at discharge.  CSW spoke with facility admissions coordinator, Joelene Millin, who states that patient is welcome to return and will return to the same room.  Per CM, patient is medically ready for discharge today.  Patient brother states that he will provide patient with transportation at discharge.  CSW remains available for support and to facilitate patient discharge back to [facility].   Assessment/plan status:  Psychosocial Support/Ongoing Assessment of Needs Other assessment/ plan:   Information/referral to community resources:   No additional resources needed at this time - patient family to provide transport and facility familiar with patient situation    PATIENT'S/FAMILY'S RESPONSE TO PLAN OF CARE: Patient alert and oriented x3 laying in bed with family at bedside.  Patient with good family support and is familiar with [facility] and the time frame that he is to participate  with therapies.  Patient verbalized understanding of CSW role and appreciation for support and concern.

## 2013-09-04 NOTE — ED Provider Notes (Signed)
I saw and evaluated the patient, reviewed the resident's note and I agree with the findings and plan.  Orders placed during the hospital encounter of 08/28/13  . EKG 12-LEAD  . EKG 12-LEAD  . EKG 12-LEAD  . EKG 12-LEAD  . EKG 12-LEAD  . EKG 12-LEAD  . EKG  . EKG 12-LEAD  . EKG 12-LEAD       Tanna Furry, MD 09/04/13 2128

## 2013-09-11 ENCOUNTER — Ambulatory Visit: Payer: Medicare Other

## 2013-09-16 ENCOUNTER — Telehealth: Payer: Self-pay | Admitting: *Deleted

## 2013-09-16 NOTE — Telephone Encounter (Signed)
That is fine 

## 2013-09-16 NOTE — Telephone Encounter (Signed)
Doren Custard (from pt/cardiac) rehab needs verbal order to continue plan of care  [phone]

## 2013-09-16 NOTE — Telephone Encounter (Signed)
LVM for PT

## 2013-09-18 ENCOUNTER — Ambulatory Visit (INDEPENDENT_AMBULATORY_CARE_PROVIDER_SITE_OTHER): Payer: Medicare Other | Admitting: *Deleted

## 2013-09-18 DIAGNOSIS — I498 Other specified cardiac arrhythmias: Secondary | ICD-10-CM

## 2013-09-18 DIAGNOSIS — R001 Bradycardia, unspecified: Secondary | ICD-10-CM

## 2013-09-19 LAB — MDC_IDC_ENUM_SESS_TYPE_INCLINIC
Battery Impedance: 100 Ohm
Battery Remaining Longevity: 126 mo
Brady Statistic AP VP Percent: 5 %
Brady Statistic AS VP Percent: 94 %
Brady Statistic AS VS Percent: 0 %
Date Time Interrogation Session: 20150917135458
Lead Channel Impedance Value: 539 Ohm
Lead Channel Impedance Value: 674 Ohm
Lead Channel Pacing Threshold Amplitude: 0.5 V
Lead Channel Pacing Threshold Pulse Width: 0.4 ms
Lead Channel Pacing Threshold Pulse Width: 0.4 ms
Lead Channel Sensing Intrinsic Amplitude: 4 mV
Lead Channel Setting Pacing Amplitude: 3.5 V
Lead Channel Setting Sensing Sensitivity: 2.8 mV
MDC IDC MSMT BATTERY VOLTAGE: 2.8 V
MDC IDC MSMT LEADCHNL RV PACING THRESHOLD AMPLITUDE: 0.75 V
MDC IDC MSMT LEADCHNL RV SENSING INTR AMPL: 4 mV
MDC IDC SET LEADCHNL RV PACING AMPLITUDE: 3.5 V
MDC IDC SET LEADCHNL RV PACING PULSEWIDTH: 0.4 ms
MDC IDC STAT BRADY AP VS PERCENT: 0 %

## 2013-09-19 NOTE — Progress Notes (Signed)
Wound check appointment. Steri-strips removed. Wound without redness or edema. Incision edges approximated, wound well healed. Normal device function. Thresholds, sensing, and impedances consistent with implant measurements. Device programmed at 3.5V for extra safety margin until 3 month visit. Histogram distribution appropriate for patient and level of activity. No mode switches or high ventricular rates noted. Patient educated about wound care, arm mobility, lifting restrictions. ROV in 3 months with GT.

## 2013-09-23 ENCOUNTER — Encounter: Payer: Self-pay | Admitting: Internal Medicine

## 2013-09-24 NOTE — Telephone Encounter (Signed)
LVM 9/23

## 2013-09-24 NOTE — Telephone Encounter (Signed)
Spoke with physical therapist  Informed him that Dr. Fletcher Anon approves patient to have cardiac rehab

## 2013-09-29 ENCOUNTER — Ambulatory Visit: Payer: Medicare Other | Admitting: Cardiology

## 2013-10-27 DIAGNOSIS — N1831 Chronic kidney disease, stage 3a: Secondary | ICD-10-CM | POA: Insufficient documentation

## 2013-10-27 DIAGNOSIS — Z85528 Personal history of other malignant neoplasm of kidney: Secondary | ICD-10-CM | POA: Insufficient documentation

## 2013-10-27 DIAGNOSIS — N183 Chronic kidney disease, stage 3 unspecified: Secondary | ICD-10-CM | POA: Insufficient documentation

## 2013-10-27 DIAGNOSIS — Z905 Acquired absence of kidney: Secondary | ICD-10-CM | POA: Insufficient documentation

## 2013-10-27 DIAGNOSIS — R7302 Impaired glucose tolerance (oral): Secondary | ICD-10-CM | POA: Insufficient documentation

## 2013-12-01 ENCOUNTER — Encounter: Payer: Self-pay | Admitting: Internal Medicine

## 2013-12-01 ENCOUNTER — Ambulatory Visit (INDEPENDENT_AMBULATORY_CARE_PROVIDER_SITE_OTHER): Payer: Medicare Other | Admitting: Internal Medicine

## 2013-12-01 VITALS — BP 138/50 | HR 64 | Ht 69.0 in | Wt 193.0 lb

## 2013-12-01 DIAGNOSIS — I10 Essential (primary) hypertension: Secondary | ICD-10-CM

## 2013-12-01 DIAGNOSIS — I441 Atrioventricular block, second degree: Secondary | ICD-10-CM | POA: Insufficient documentation

## 2013-12-01 DIAGNOSIS — R001 Bradycardia, unspecified: Secondary | ICD-10-CM

## 2013-12-01 DIAGNOSIS — Z950 Presence of cardiac pacemaker: Secondary | ICD-10-CM

## 2013-12-01 DIAGNOSIS — E785 Hyperlipidemia, unspecified: Secondary | ICD-10-CM

## 2013-12-01 LAB — MDC_IDC_ENUM_SESS_TYPE_INCLINIC
Brady Statistic AS VS Percent: 0 %
Lead Channel Impedance Value: 499 Ohm
Lead Channel Impedance Value: 633 Ohm
Lead Channel Pacing Threshold Pulse Width: 0.4 ms
Lead Channel Sensing Intrinsic Amplitude: 5.6 mV
Lead Channel Setting Pacing Amplitude: 2 V
Lead Channel Setting Pacing Pulse Width: 0.4 ms
Lead Channel Setting Sensing Sensitivity: 2.8 mV
MDC IDC MSMT BATTERY IMPEDANCE: 100 Ohm
MDC IDC MSMT BATTERY REMAINING LONGEVITY: 154 mo
MDC IDC MSMT BATTERY VOLTAGE: 2.8 V
MDC IDC MSMT LEADCHNL RA PACING THRESHOLD AMPLITUDE: 0.5 V
MDC IDC MSMT LEADCHNL RA PACING THRESHOLD PULSEWIDTH: 0.4 ms
MDC IDC MSMT LEADCHNL RA SENSING INTR AMPL: 4 mV
MDC IDC MSMT LEADCHNL RV PACING THRESHOLD AMPLITUDE: 0.75 V
MDC IDC SESS DTM: 20151130154204
MDC IDC SET LEADCHNL RV PACING AMPLITUDE: 2.5 V
MDC IDC STAT BRADY AP VP PERCENT: 10 %
MDC IDC STAT BRADY AP VS PERCENT: 0 %
MDC IDC STAT BRADY AS VP PERCENT: 89 %

## 2013-12-01 NOTE — Patient Instructions (Signed)
Remote monitoring is used to monitor your pacemaker from home. This monitoring reduces the number of office visits required to check your device to one time per year. It allows [place] to keep an eye on the functioning of your device to ensure it is working properly. You are scheduled for a device check from home on 03-04-2014. You may send your transmission at any time that day. If you have a wireless device, the transmission will be sent automatically. After your physician reviews your transmission, you will receive a postcard with your next transmission date.  Your physician recommends that you schedule a follow-up appointment in: 9 months with Dr.Klein in [facility]

## 2013-12-01 NOTE — Assessment & Plan Note (Signed)
His Medtronic dual-chamber pacemaker is working normally. Today we reduced his lower rate 55, and increased his AV delays to allow for intrinsic conduction. We'll plan to recheck in several months in our [place] office per patient request.

## 2013-12-01 NOTE — Progress Notes (Signed)
HPI  Jacob [name] returns today for follow-up. He is a very pleasant 80 year old man with symptomatic Mobitz 2 second-degree AV block, who underwent permanent pacemaker insertion approximately 3 months ago. The patient also has hypertension, and coronary artery disease, status post stenting. In the interim, he has had no syncope or dizziness. He does note mild fatigue. He is pacing approximately 99% of the time. He denies anginal symptoms. No peripheral edema. The patient requests that he be transferred to our [place] clinic because he does not like driving in traffic in [place].  Allergies  Allergen Reactions  . Penicillins Hives    BLISTERS     Current Outpatient Prescriptions  Medication Sig Dispense Refill  . acetaminophen (TYLENOL) 325 MG tablet Take 650 mg by mouth every 4 (four) hours as needed.    Marland Kitchen amLODipine (NORVASC) 5 MG tablet Take 1 tablet (5 mg total) by mouth daily. 30 tablet 5  . aspirin 81 MG tablet Take 81 mg by mouth daily.    . cetirizine (ZYRTEC) 10 MG tablet Take 10 mg by mouth every morning. ALLERGIES     . isosorbide mononitrate (IMDUR) 30 MG 24 hr tablet Take 30 mg by mouth at bedtime.     Marland Kitchen latanoprost (XALATAN) 0.005 % ophthalmic solution Place 1 drop into both eyes at bedtime.     Marland Kitchen losartan (COZAAR) 50 MG tablet Take 1 tablet by mouth daily.    Marland Kitchen lovastatin (MEVACOR) 20 MG tablet Take 20 mg by mouth at bedtime.     . Multiple Vitamins-Minerals (MULTIVITAMINS THER. W/MINERALS) TABS Take 1 tablet by mouth daily.     . Omega-3 Fatty Acids (OMEGA 3 PO) Take 1,200 mg by mouth 2 (two) times daily.     Marland Kitchen omeprazole (PRILOSEC) 20 MG capsule Take 20 mg by mouth daily.    . ranolazine (RANEXA) 500 MG 12 hr tablet Take 500 mg by mouth 2 (two) times daily.    Marland Kitchen senna-docusate (SENOKOT-S) 8.6-50 MG per tablet Take 1 tablet by mouth 2 (two) times daily. While taking pain meds to prevent constipation (Patient taking differently: Take 1 tablet by mouth daily.  While taking pain meds to prevent constipation) 30 tablet 0   No current facility-administered medications for this visit.     Past Medical History  Diagnosis Date  . GERD (gastroesophageal reflux disease)   . H/O hiatal hernia   . Arthritis     hands  . BPH (benign prostatic hypertrophy)   . History of atrial fibrillation     a. 2006.  . Glaucoma of both eyes   . Transitional cell carcinoma of bladder FOLLOWED BY DR Gaynelle Arabian    BLADDER INSTILLATION TX'S  . Hyperlipidemia   . Hypertension   . LBBB (left bundle branch block)   . First degree heart block   . Coronary artery disease     a. 2006 s/p PCI/Stenting x 2 to the RCA;  b. 05/03/12 Cardiac CT: Patent RCA stent, nonobs dzs;  c. 01/2013 Ex MV: nl EF, no ischemia.  Marland Kitchen History of diverticulitis   . Left Renal Pelvis Cancer     a. 08/2013 s/p L robotic assisted nephro-ureterectomy.  Marland Kitchen History of kidney stones   . HTN (hypertension)   . Hyperlipidemia   . Renal insufficiency     a. Creat rose to 2.34 post-op L nephrectomy.  . Sinus bradycardia     ROS:   All systems reviewed and negative except as noted in the  HPI.   Past Surgical History  Procedure Laterality Date  . Cystoscopy with biopsy  04/13/2011  RECURRENT TCC OF BLADDER    Procedure: CYSTOSCOPY WITH BIOPSY;  Surgeon: [name], MD;  Location: [facility];  Service: Urology;  Laterality: N/A;  Cold Cup Biopsy  . Transurethral resection of bladder tumor  12-22-2010    BLADDER CANCER  . Appendectomy  02/1988  . Cataract extraction w/ intraocular lens  implant, bilateral  2011  . Transurethral resection of bladder tumor  08/04/2011    Procedure: TRANSURETHRAL RESECTION OF BLADDER TUMOR (TURBT);  Surgeon: [name], MD;  [facility];  Service: Urology;  Laterality: N/A;  . Coronary angioplasty with stent placement  2006    [facility]    X2 STENTS  TO RCA  . Cystoscopy with retrograde pyelogram, ureteroscopy and stent placement Left  06/19/2013    Procedure: CYSTOSCOPY WITH LEFT RETROGRADE PYELOGRAM, LEFT URETEROSCOPY, ureteral balloon dilatation, BIOPSY LEFT KIDNEY;  Surgeon: [name], MD;  Location: [facility];  Service: Urology;  Laterality: Left;  . Robot assited laparoscopic nephroureterectomy Left 08/22/2013    Procedure: ROBOT ASSITED LAPAROSCOPIC NEPHROURETERECTOMY;  Surgeon: [name], MD;  Location: [facility];  Service: Urology;  Laterality: Left;  . Cystoscopy w/ ureteral stent placement Left 08/22/2013    Procedure: CYSTOSCOPY WITH RETROGRADE PYELOGRAM/URETERAL STENT PLACEMENT;  Surgeon: [name], MD;  Location: [facility];  Service: Urology;  Laterality: Left;  . Inguinal hernia repair Left 08/22/2013    Procedure: LAPAROSCOPIC INGUINAL HERNIA;  Surgeon: [name], MD;  Location: [facility];  Service: Urology;  Laterality: Left;     Family History  Problem Relation Age of Onset  . Heart attack Mother   . Heart attack Father      History   Social History  . Marital Status: Widowed    Spouse Name: N/A    Number of Children: N/A  . Years of Education: N/A   Occupational History  . Not on file.   Social History Main Topics  . Smoking status: Former Smoker -- 1.00 packs/day for 50 years    Types: Cigarettes    Quit date: 11/02/2000  . Smokeless tobacco: Never Used  . Alcohol Use: No  . Drug Use: No  . Sexual Activity: Not on file   Other Topics Concern  . Not on file   Social History Narrative     BP 138/50 mmHg  Pulse 64  Ht 5\' 9"  (1.753 m)  Wt 193 lb (87.544 kg)  BMI 28.49 kg/m2  Physical Exam:  Well appearing  80 year old man, NAD HEENT: Unremarkable Neck:  6 cm  JVD, no thyromegally Lymphatics:  No adenopathy Back:  No CVA tenderness Lungs:  Clear, With no wheezes rales or rhonchi. Well-healed pacemaker incision  HEART:  Regular rate rhythm, no murmurs, no rubs, no clicks Abd:  soft, positive bowel sounds, no organomegally, no rebound, no  guarding Ext:  2 plus pulses, no edema, no cyanosis, no clubbing Skin:  No rashes no nodules Neuro:  CN II through XII intact, motor grossly intact   DEVICE  Normal device function.  See PaceArt for details.   Assess/Plan:

## 2013-12-01 NOTE — Assessment & Plan Note (Signed)
His blood pressure is reasonably well controlled. He is encouraged to maintain a low-sodium diet and take his medications.

## 2013-12-01 NOTE — Assessment & Plan Note (Signed)
He will continue his statin therapy. He will call us if he develops muscle cramps or muscle aches. He is encouraged to maintain a low-fat diet.

## 2013-12-01 NOTE — Assessment & Plan Note (Signed)
Today the patient's underlying rhythm demonstrates first-degree AV block, and his pacemaker has been reprogrammed to allow for intrinsic conduction.

## 2013-12-02 ENCOUNTER — Encounter: Payer: Self-pay | Admitting: Internal Medicine

## 2013-12-11 ENCOUNTER — Encounter (HOSPITAL_COMMUNITY): Payer: Self-pay | Admitting: Internal Medicine

## 2014-01-07 ENCOUNTER — Other Ambulatory Visit: Payer: Self-pay | Admitting: Urology

## 2014-01-08 ENCOUNTER — Other Ambulatory Visit: Payer: Self-pay | Admitting: Urology

## 2014-01-09 ENCOUNTER — Encounter (HOSPITAL_BASED_OUTPATIENT_CLINIC_OR_DEPARTMENT_OTHER): Payer: Self-pay | Admitting: *Deleted

## 2014-01-09 NOTE — Progress Notes (Signed)
NPO AFTER MN. ARRIVE AT 1000. NEEDS ISTAT. CURRENT EKG AND PACER CHECK IN CHART AND EPIC. WILL TAKE AM MEDS W/ SIPS OF WATER DOS.

## 2014-01-13 ENCOUNTER — Ambulatory Visit (HOSPITAL_BASED_OUTPATIENT_CLINIC_OR_DEPARTMENT_OTHER)
Admission: RE | Admit: 2014-01-13 | Discharge: 2014-01-13 | Disposition: A | Discharge status: Home / Self Care | Payer: Medicare Other | Source: Ambulatory Visit | Attending: Urology | Admitting: Urology

## 2014-01-13 ENCOUNTER — Encounter (HOSPITAL_BASED_OUTPATIENT_CLINIC_OR_DEPARTMENT_OTHER)
Admission: RE | Disposition: A | Discharge status: Home / Self Care | Payer: Self-pay | Source: Ambulatory Visit | Attending: Urology

## 2014-01-13 ENCOUNTER — Encounter (HOSPITAL_BASED_OUTPATIENT_CLINIC_OR_DEPARTMENT_OTHER): Payer: Self-pay | Admitting: *Deleted

## 2014-01-13 ENCOUNTER — Ambulatory Visit (HOSPITAL_BASED_OUTPATIENT_CLINIC_OR_DEPARTMENT_OTHER): Payer: Medicare Other | Admitting: Anesthesiology

## 2014-01-13 DIAGNOSIS — N289 Disorder of kidney and ureter, unspecified: Secondary | ICD-10-CM | POA: Insufficient documentation

## 2014-01-13 DIAGNOSIS — I10 Essential (primary) hypertension: Secondary | ICD-10-CM | POA: Insufficient documentation

## 2014-01-13 DIAGNOSIS — N400 Enlarged prostate without lower urinary tract symptoms: Secondary | ICD-10-CM | POA: Insufficient documentation

## 2014-01-13 DIAGNOSIS — Z9861 Coronary angioplasty status: Secondary | ICD-10-CM | POA: Diagnosis not present

## 2014-01-13 DIAGNOSIS — I447 Left bundle-branch block, unspecified: Secondary | ICD-10-CM | POA: Diagnosis not present

## 2014-01-13 DIAGNOSIS — E785 Hyperlipidemia, unspecified: Secondary | ICD-10-CM | POA: Diagnosis not present

## 2014-01-13 DIAGNOSIS — H409 Unspecified glaucoma: Secondary | ICD-10-CM | POA: Diagnosis not present

## 2014-01-13 DIAGNOSIS — Z87891 Personal history of nicotine dependence: Secondary | ICD-10-CM | POA: Diagnosis not present

## 2014-01-13 DIAGNOSIS — I4891 Unspecified atrial fibrillation: Secondary | ICD-10-CM | POA: Diagnosis not present

## 2014-01-13 DIAGNOSIS — Z950 Presence of cardiac pacemaker: Secondary | ICD-10-CM | POA: Insufficient documentation

## 2014-01-13 DIAGNOSIS — Z85528 Personal history of other malignant neoplasm of kidney: Secondary | ICD-10-CM | POA: Insufficient documentation

## 2014-01-13 DIAGNOSIS — I441 Atrioventricular block, second degree: Secondary | ICD-10-CM | POA: Insufficient documentation

## 2014-01-13 DIAGNOSIS — C679 Malignant neoplasm of bladder, unspecified: Secondary | ICD-10-CM | POA: Diagnosis present

## 2014-01-13 DIAGNOSIS — I2510 Atherosclerotic heart disease of native coronary artery without angina pectoris: Secondary | ICD-10-CM | POA: Diagnosis not present

## 2014-01-13 DIAGNOSIS — K219 Gastro-esophageal reflux disease without esophagitis: Secondary | ICD-10-CM | POA: Diagnosis not present

## 2014-01-13 DIAGNOSIS — Z880 Allergy status to penicillin: Secondary | ICD-10-CM | POA: Insufficient documentation

## 2014-01-13 HISTORY — PX: TRANSURETHRAL RESECTION OF BLADDER TUMOR: SHX2575

## 2014-01-13 HISTORY — PX: CYSTOSCOPY W/ RETROGRADES: SHX1426

## 2014-01-13 HISTORY — DX: Presence of cardiac pacemaker: Z95.0

## 2014-01-13 HISTORY — DX: Bradycardia, unspecified: R00.1

## 2014-01-13 HISTORY — DX: Atrioventricular block, second degree: I44.1

## 2014-01-13 HISTORY — DX: Personal history of other malignant neoplasm of kidney: Z85.528

## 2014-01-13 HISTORY — DX: Malignant neoplasm of bladder, unspecified: C67.9

## 2014-01-13 LAB — POCT I-STAT 4, (NA,K, GLUC, HGB,HCT)
GLUCOSE: 112 mg/dL — AB (ref 70–99)
HEMATOCRIT: 41 % (ref 39.0–52.0)
Hemoglobin: 13.9 g/dL (ref 13.0–17.0)
Potassium: 4.8 mmol/L (ref 3.5–5.1)
Sodium: 142 mmol/L (ref 135–145)

## 2014-01-13 SURGERY — TURBT (TRANSURETHRAL RESECTION OF BLADDER TUMOR)
Anesthesia: General | Site: Ureter | Laterality: Right

## 2014-01-13 MED ORDER — PROPOFOL 10 MG/ML IV BOLUS
INTRAVENOUS | Status: DC | PRN
  Administered 2014-01-13: 150 mg via INTRAVENOUS
  Administered 2014-01-13: 50 mg via INTRAVENOUS

## 2014-01-13 MED ORDER — LIDOCAINE HCL (CARDIAC) 20 MG/ML IV SOLN
INTRAVENOUS | Status: DC | PRN
  Administered 2014-01-13: 50 mg via INTRAVENOUS

## 2014-01-13 MED ORDER — FENTANYL CITRATE 0.05 MG/ML IJ SOLN
INTRAMUSCULAR | Status: DC | PRN
  Administered 2014-01-13: 50 ug via INTRAVENOUS

## 2014-01-13 MED ORDER — FENTANYL CITRATE 0.05 MG/ML IJ SOLN
INTRAMUSCULAR | Status: AC
  Filled 2014-01-13: qty 6

## 2014-01-13 MED ORDER — LACTATED RINGERS IV SOLN
INTRAVENOUS | Status: DC
  Administered 2014-01-13: 11:00:00 via INTRAVENOUS
  Filled 2014-01-13: qty 1000

## 2014-01-13 MED ORDER — SENNOSIDES-DOCUSATE SODIUM 8.6-50 MG PO TABS: 1.0000 | ORAL_TABLET | Freq: Two times a day (BID) | ORAL | Status: DC

## 2014-01-13 MED ORDER — CIPROFLOXACIN IN D5W 200 MG/100ML IV SOLN
200.0000 mg | Freq: Two times a day (BID) | INTRAVENOUS | Status: DC
  Administered 2014-01-13: 200 mg via INTRAVENOUS
  Filled 2014-01-13: qty 100

## 2014-01-13 MED ORDER — CIPROFLOXACIN IN D5W 200 MG/100ML IV SOLN
INTRAVENOUS | Status: AC
Start: 2014-01-13 — End: 2014-01-13
  Filled 2014-01-13: qty 100

## 2014-01-13 MED ORDER — ONDANSETRON HCL 4 MG/2ML IJ SOLN
4.0000 mg | Freq: Once | INTRAMUSCULAR | Status: DC | PRN
  Filled 2014-01-13: qty 2

## 2014-01-13 MED ORDER — EPHEDRINE SULFATE 50 MG/ML IJ SOLN
INTRAMUSCULAR | Status: DC | PRN
  Administered 2014-01-13: 10 mg via INTRAVENOUS
  Administered 2014-01-13: 5 mg via INTRAVENOUS

## 2014-01-13 MED ORDER — MIDAZOLAM HCL 2 MG/2ML IJ SOLN
INTRAMUSCULAR | Status: AC
  Filled 2014-01-13: qty 2

## 2014-01-13 MED ORDER — TRAMADOL HCL 50 MG PO TABS: 50.0000 mg | ORAL_TABLET | Freq: Four times a day (QID) | ORAL | Status: DC | PRN

## 2014-01-13 MED ORDER — IOHEXOL 350 MG/ML SOLN
INTRAVENOUS | Status: DC | PRN
  Administered 2014-01-13: 10 mL

## 2014-01-13 MED ORDER — FENTANYL CITRATE 0.05 MG/ML IJ SOLN
25.0000 ug | INTRAMUSCULAR | Status: DC | PRN
  Filled 2014-01-13: qty 1

## 2014-01-13 MED ORDER — SODIUM CHLORIDE 0.9 % IR SOLN
Status: DC | PRN
  Administered 2014-01-13: 3000 mL

## 2014-01-13 SURGICAL SUPPLY — 20 items
BAG URINE DRAINAGE (UROLOGICAL SUPPLIES) IMPLANT
BAG URINE LEG 19OZ MD ST LTX (BAG) IMPLANT
BAG URINE LEG 500ML (DRAIN) IMPLANT
CATH FOLEY 2WAY SLVR  5CC 22FR (CATHETERS)
CATH FOLEY 2WAY SLVR 30CC 20FR (CATHETERS) IMPLANT
CATH FOLEY 2WAY SLVR 5CC 22FR (CATHETERS) IMPLANT
CATH INTERMIT  6FR 70CM (CATHETERS) ×4 IMPLANT
CLOTH BEACON ORANGE TIMEOUT ST (SAFETY) ×4 IMPLANT
DRAPE CAMERA CLOSED 9X96 (DRAPES) ×4 IMPLANT
ELECT LOOP MED HF 24F 12D CBL (CLIP) ×4 IMPLANT
GLOVE BIO SURGEON STRL SZ 6.5 (GLOVE) ×3 IMPLANT
GLOVE BIO SURGEON STRL SZ7.5 (GLOVE) ×4 IMPLANT
GLOVE BIO SURGEONS STRL SZ 6.5 (GLOVE) ×1
GLOVE BIOGEL PI IND STRL 6.5 (GLOVE) ×4 IMPLANT
GLOVE BIOGEL PI INDICATOR 6.5 (GLOVE) ×4
GOWN STRL REUS W/ TWL LRG LVL3 (GOWN DISPOSABLE) ×2 IMPLANT
GOWN STRL REUS W/TWL LRG LVL3 (GOWN DISPOSABLE) ×2
GOWN STRL REUS W/TWL XL LVL3 (GOWN DISPOSABLE) ×4 IMPLANT
IV NS IRRIG 3000ML ARTHROMATIC (IV SOLUTION) ×4 IMPLANT
PACK CYSTO (CUSTOM PROCEDURE TRAY) ×4 IMPLANT

## 2014-01-13 NOTE — Anesthesia Postprocedure Evaluation (Signed)
  Anesthesia Post-op Note  Patient: Jacob [name]  Procedure(s) Performed: Procedure(s): TRANSURETHRAL RESECTION OF BLADDER TUMOR (TURBT) (N/A) CYSTOSCOPY WITH RETROGRADE PYELOGRAM (Right)  Patient Location: PACU  Anesthesia Type:General  Level of Consciousness: awake and alert   Airway and Oxygen Therapy: Patient Spontanous Breathing  Post-op Pain: none  Post-op Assessment: Post-op Vital signs reviewed  Post-op Vital Signs: Reviewed  Last Vitals:  Filed Vitals:   01/13/14 1238  BP: 119/59  Pulse: 79  Temp:   Resp: 21    Complications: No apparent anesthesia complications

## 2014-01-13 NOTE — Transfer of Care (Signed)
Immediate Anesthesia Transfer of Care Note  Patient: Jacob [name]  Procedure(s) Performed: Procedure(s) (LRB): TRANSURETHRAL RESECTION OF BLADDER TUMOR (TURBT) (N/A) CYSTOSCOPY WITH RETROGRADE PYELOGRAM (Right)  Patient Location: PACU  Anesthesia Type: General  Level of Consciousness: awake, oriented, sedated and patient cooperative  Airway & Oxygen Therapy: Patient Spontanous Breathing and Patient connected to face mask oxygen  Post-op Assessment: Report given to PACU RN and Post -op Vital signs reviewed and stable  Post vital signs: Reviewed and stable  Complications: No apparent anesthesia complications

## 2014-01-13 NOTE — H&P (Signed)
Jacob [name] is an 81 y.o. male.    Chief Complaint: Pre-op Transurethral Resection BLadder Tumor  HPI:    1 - Left Renal Pelvis Cancer - s/p left nephroureterectomy 08/2013 for large volume pTaG3 renal pelvis cancer with negative margins.   Post-op Surveillance: 01/2014 - Cysto small left wall bladder tumor (about 2.5cm) / CT No bulky disesae (final read pending)  2 - Bladder Cancer -  2012 - TaG3 by TURBT 2013 - Induction BCG x 6 2014 - NED by surv cystos  3 - Solitary Kidney / Renal Insufficiency - s/p nephroureterectomy as per above. Cr post-op 2-2.5 range.   PMH sig for CAD/Stent (no deficits at baseline, just ASA 81), CHF/Bradycardia/Pacer,  App[place].  He does live alone.  His PCP is Dr. Vedia Coffer in [place].   Today Jacob [name] is seen to proceed with TURBT / retrogrades for his recurrent bladder cancer.    Past Medical History  Diagnosis Date  . GERD (gastroesophageal reflux disease)   . H/O hiatal hernia   . Arthritis     hands  . BPH (benign prostatic hypertrophy)   . History of atrial fibrillation     a. 2006.  . Glaucoma of both eyes   . Hyperlipidemia   . History of diverticulitis   . History of kidney stones   . Hyperlipidemia   . Renal insufficiency     a. Creat rose to 2.34 post-op L nephrectomy.  . Hypertension   . LBBB (left bundle branch block)   . Mobitz type 2 second degree AV block   . Symptomatic bradycardia     s/p  pacemaker placement 08-29-2013  . S/P placement of cardiac pacemaker MEDTRONIC last pacer check 12-01-2013    08-29-2013  for symptomatic bradycardia and mobitz 2 second degree heart block  . History of renal cell carcinoma     08-22-2013  s/p  left nephrouretectomy  . Recurrent bladder transitional cell carcinoma monitored by dr [name]    s/p turb's  and chemo bladder instillations  . Coronary artery disease cardiologist-- dr Fletcher Anon  and dr gregg taylor    a. 2006 s/p PCI/Stenting x 2 to the RCA;  b. 05/03/12 Cardiac CT: Patent  RCA stent, nonobs dzs;  c. 01/2013 Ex MV: nl EF, no ischemia.  . At risk for sleep apnea     STOP-BANG=  4     SENT TO PCP [id number]    Past Surgical History  Procedure Laterality Date  . Cystoscopy with biopsy  04/13/2011      Procedure: CYSTOSCOPY WITH BIOPSY;  Surgeon: [name], MD;  Location: [facility];  Service: Urology;  Laterality: N/A;  Cold Cup Biopsy  . Transurethral resection of bladder tumor  12-22-2010  . Appendectomy  02/1988  . Cataract extraction w/ intraocular lens  implant, bilateral  2011  . Transurethral resection of bladder tumor  08/04/2011    Procedure: TRANSURETHRAL RESECTION OF BLADDER TUMOR (TURBT);  Surgeon: [name], MD;  Location: [facility];  Service: Urology;  Laterality: N/A;  . Coronary angioplasty with stent placement  2006    [facility]    X2 STENTS  TO RCA  . Cystoscopy with retrograde pyelogram, ureteroscopy and stent placement Left 06/19/2013    Procedure: CYSTOSCOPY WITH LEFT RETROGRADE PYELOGRAM, LEFT URETEROSCOPY, ureteral balloon dilatation, BIOPSY LEFT KIDNEY;  Surgeon: [name], MD;  Location: [facility];  Service: Urology;  Laterality: Left;  . Robot assited laparoscopic nephroureterectomy Left 08/22/2013  Procedure: ROBOT ASSITED LAPAROSCOPIC NEPHROURETERECTOMY;  Surgeon: [name], MD;  Location: [facility];  Service: Urology;  Laterality: Left;  . Cystoscopy w/ ureteral stent placement Left 08/22/2013    Procedure: CYSTOSCOPY WITH RETROGRADE PYELOGRAM/URETERAL STENT PLACEMENT;  Surgeon: [name], MD;  Location: [facility];  Service: Urology;  Laterality: Left;  . Inguinal hernia repair Left 08/22/2013    Procedure: LAPAROSCOPIC INGUINAL HERNIA;  Surgeon: [name], MD;  Location: [facility];  Service: Urology;  Laterality: Left;  . Permanent pacemaker insertion N/A 08/29/2013    Procedure: PERMANENT PACEMAKER INSERTION;  Surgeon: [name], MD;  Location: [facility];  Service:  Cardiovascular;  Laterality: N/A;  Medtronic dual-chamber  . Transthoracic echocardiogram  08-29-2013    mild LVH/  ef 60-65% /  grade I diastolic dysfunction/  mild MR/  mild LAE  and RAE/  moderate TR    Family History  Problem Relation Age of Onset  . Heart attack Mother   . Heart attack Father    Social History:  reports that he quit smoking about 13 years ago. His smoking use included Cigarettes. He has a 50 pack-year smoking history. He has never used smokeless tobacco. He reports that he does not drink alcohol or use illicit drugs.  Allergies:  Allergies  Allergen Reactions  . Penicillins Hives    BLISTERS    No prescriptions prior to admission    No results found for this or any previous visit (from the past 48 hour(s)). No results found.  Review of Systems  Constitutional: Negative.  Negative for fever and chills.  HENT: Negative.   Eyes: Negative.   Respiratory: Negative.   Cardiovascular: Negative.   Gastrointestinal: Negative.   Genitourinary: Positive for hematuria.  Musculoskeletal: Negative.   Skin: Negative.   Neurological: Negative.   Endo/Heme/Allergies: Negative.   Psychiatric/Behavioral: Negative.     Height 5\' 9"  (1.753 m), weight 87.544 kg (193 lb). Physical Exam  Constitutional: He appears well-developed.  HENT:  Head: Normocephalic.  Eyes: Pupils are equal, round, and reactive to light.  Neck: Normal range of motion.  Cardiovascular: Normal rate.   Respiratory: Effort normal.  GI: Soft.  Genitourinary: Penis normal.  Neurological: He is alert.  Skin: Skin is warm and dry.  Psychiatric: He has a normal mood and affect. His behavior is normal. Judgment and thought content normal.     Assessment/Plan   1 - Left Renal Pelvis Cancer - up to date on surveillance.   2 - Bladder Cancer - likley early recurrence by cysto last office visit.   We rediscussed operative biopsy / transurethral resection as the best next step for diagnostic and  therapeutic purposes with goals being to remove all visible cancer and obtain tissue for pathologic exam. We rediscussed that for some low-grade tumors, this may be all the treatment required, but that for many other tumors such as high-grade lesions, further therapy including surgery and or chemotherapy may be warranted. We also reoutlined the fact that any bladder cancer diagnosis will require close follow-up with periodic upper and lower tract evaluation. We rediscussed risks including bleeding, infection, damage to kidney / ureter / bladder including bladder perforation which can typically managed with prolonged foley catheterization. We rementioned anesthetic and other rare risks including DVT, PE, MI, and mortality. I also rementioned that adjunctive procedures such as ureteral stenting, retrograde pyelography, and ureteroscopy may be necessary to fully evaluate the urinary tract depending on intra-operative findings.   After answering all questions to the  patient's satisfaction, they wish to proceed today as planned.    3 - Solitary Kidney / Renal Insufficiency - GFR and electrolytes stable by recent labs, will monitor.  Keyaria Lawson 01/13/2014, 8:27 AM

## 2014-01-13 NOTE — Discharge Instructions (Signed)
1 - You may have urinary urgency (bladder spasms) and bloody urine on / off x few days. This is normal.  2 - Call MD or go to ER for fever >102, severe pain / nausea / vomiting not relieved by medications, or acute change in medical status                                               Transurethral Procedure  Medications: Resume all your other meds from home  Activity: 1. No heavy lifting > 10 pounds for 2 weeks. 2. No sexual activity for 2 weeks. 3. No strenuous activity for 2 weeks. 4. No driving while on narcotic pain medications. 5. Drink plenty of water. 6. Continue to walk at home - you can still get blood clots when you are at home so keep     active but don't over do it. 7. Your urine may have some blood in it - make sure you drink plenty of water. Call or           come to the ER immediately if you catheter stops draining or you are unable to urinate.  Foley Catheter ( if you go home with a catheter in place ) 1. Make sure your catheter is attached to your leg at all times - do not let anything pull on      it. 2. If the urine in your tube starts looking dark red or if it stops draining, call us immediately    or come to the ER. 3. Drink plenty of water. If you notice your urine looking dark, sit down, relax and drink         lots of water. 4. You will be given a leg bag as well as an overnight bag for your catheter- MAKE SURE CATHETER IS ATTACHED TO YOUR LEG AT ALL TIMES WITH TAPE OR LEG STRAP  Bathing: You can shower. You can take a bath unless you have a foley catheter in place.  Signs / Symptoms to call: 1. Call if you have a fever greater than 101.5  2. Uncontrolled nausea / vomiting, uncontrolled pain / dizziness, unable to urinate, leg         swelling / leg pain, or any other concerns.   You can reach [place] at [place] Instructions  Activity: Get plenty of rest for the remainder of the day. A responsible adult should stay with  you for 24 hours following the procedure.  For the next 24 hours, DO NOT: -Drive a car -Paediatric nurse -Drink alcoholic beverages -Take any medication unless instructed by your physician -Make any legal decisions or sign important papers.  Meals: Start with liquid foods such as gelatin or soup. Progress to regular foods as tolerated. Avoid greasy, spicy, heavy foods. If nausea and/or vomiting occur, drink only clear liquids until the nausea and/or vomiting subsides. Call your physician if vomiting continues.  Special Instructions/Symptoms: Your throat may feel dry or sore from the anesthesia or the breathing tube placed in your throat during surgery. If this causes discomfort, gargle with warm salt water. The discomfort should disappear within 24 hours.

## 2014-01-13 NOTE — Anesthesia Procedure Notes (Signed)
Procedure Name: LMA Insertion Date/Time: 01/13/2014 11:37 AM Performed by: Denna Haggard D Pre-anesthesia Checklist: Patient identified, Emergency Drugs available, Suction available and Patient being monitored Patient Re-evaluated:Patient Re-evaluated prior to inductionOxygen Delivery Method: Circle System Utilized Preoxygenation: Pre-oxygenation with 100% oxygen Intubation Type: IV induction Ventilation: Mask ventilation without difficulty LMA: LMA inserted LMA Size: 5.0 Number of attempts: 1 Airway Equipment and Method: bite block Placement Confirmation: positive ETCO2 Tube secured with: Tape Dental Injury: Teeth and Oropharynx as per pre-operative assessment

## 2014-01-13 NOTE — Anesthesia Preprocedure Evaluation (Signed)
Anesthesia Evaluation  Patient identified by MRN, date of birth, ID band Patient awake    Reviewed: Allergy & Precautions, H&P , NPO status , Patient's Chart, lab work & pertinent test results  Airway Mallampati: III  TM Distance: >3 FB Neck ROM: Full    Dental  (+) Edentulous Upper, Edentulous Lower   Pulmonary former smoker,  breath sounds clear to auscultation- rhonchi        Cardiovascular hypertension, + CAD and + Cardiac Stents + dysrhythmias (LBBB, Hx of afib, 2nd degree AV block) + pacemaker Rhythm:Regular Rate:Normal     Neuro/Psych negative neurological ROS  negative psych ROS   GI/Hepatic Neg liver ROS, hiatal hernia, GERD-  Medicated,  Endo/Other  negative endocrine ROS  Renal/GU Renal InsufficiencyRenal disease (Left renal pelvis cancer)  negative genitourinary   Musculoskeletal negative musculoskeletal ROS (+)   Abdominal   Peds  Hematology negative hematology ROS (+)   Anesthesia Other Findings   Reproductive/Obstetrics negative OB ROS                             Anesthesia Physical  Anesthesia Plan  ASA: III  Anesthesia Plan: General   Post-op Pain Management:    Induction: Intravenous  Airway Management Planned: Oral ETT and LMA  Additional Equipment: None  Intra-op Plan:   Post-operative Plan: Extubation in OR  Informed Consent: I have reviewed the patients History and Physical, chart, labs and discussed the procedure including the risks, benefits and alternatives for the proposed anesthesia with the patient or authorized representative who has indicated his/her understanding and acceptance.     Plan Discussed with: CRNA and Surgeon  Anesthesia Plan Comments:         Anesthesia Quick Evaluation

## 2014-01-13 NOTE — Brief Op Note (Signed)
01/13/2014  12:00 PM  PATIENT:  Jacob [name]  81 y.o. male  PRE-OPERATIVE DIAGNOSIS:  RECURRENT BLADDER CANCER  POST-OPERATIVE DIAGNOSIS:  RECURRENT BLADDER CANCER  PROCEDURE:  Procedure(s): TRANSURETHRAL RESECTION OF BLADDER TUMOR (TURBT) (N/A) CYSTOSCOPY WITH RETROGRADE PYELOGRAM (Right)  SURGEON:  Surgeon(s) and Role:    * [name], MD - Primary  PHYSICIAN ASSISTANT:   ASSISTANTS: none   ANESTHESIA:   general  EBL:  Total I/O In: 200 [I.V.:200] Out: -   BLOOD ADMINISTERED:none  DRAINS: none   LOCAL MEDICATIONS USED:  NONE  SPECIMEN:  Source of Specimen:  1 - bladder tumors, 2 - base of bladder tumors  DISPOSITION OF SPECIMEN:  PATHOLOGY  COUNTS:  YES  TOURNIQUET:  * No tourniquets in log *  DICTATION: .Other Dictation: Dictation Number [id number]  PLAN OF CARE: Discharge to home after PACU  PATIENT DISPOSITION:  PACU - hemodynamically stable.   Delay start of Pharmacological VTE agent (>24hrs) due to surgical blood loss or risk of bleeding: not applicable

## 2014-01-14 ENCOUNTER — Encounter (HOSPITAL_BASED_OUTPATIENT_CLINIC_OR_DEPARTMENT_OTHER): Payer: Self-pay | Admitting: Urology

## 2014-01-14 NOTE — Op Note (Signed)
NAMEMarland [name]  Jacob, [name] NO.:  [id number]  MEDICAL RECORD NO.:  [id number]  LOCATION:                                 FACILITY:  PHYSICIAN:  [name], MD     DATE OF BIRTH:  1928/02/26  DATE OF PROCEDURE:  01/13/2014 DATE OF DISCHARGE:  01/13/2014                              OPERATIVE REPORT   DIAGNOSIS:  Recurrent bladder cancer.  PROCEDURES: 1. Transurethral resection of bladder tumor, volume medium. 2. Right retrograde pyelogram with interpretation.  ESTIMATED BLOOD LOSS:  Nil.  COMPLICATIONS:  None.  SPECIMENS: 1. Bladder tumor. 2. Base of bladder tumor.  FINDINGS: 1. Multifocal small-to-moderate volume papillary bladder tumor arising     from the left lateral wall and right upper wall, total volume     approximately 2.5 centimeter squared. 2. Absence of the left ureteral orifice consistent with prior left     nephroureterectomy. 3. Unremarkable right retrograde pyelogram, somewhat bifid-appearing     right kidney.  INDICATION:  Jacob [name] is a very pleasant 81 year old gentleman with recurrent transitional cell carcinoma.  He has had bladder tumor times several as well as induction of BCG.  He had left upper tract large volume high-grade cancer as well, that was managed with nephroureterectomy previously.  He was found on surveillance to have recurrent bladder tumor of small volume.  He was having some hematuria intermittently.  Options were discussed for management including transurethral resection with goal of complete resection of this before becoming larger and he wished to proceed.  Informed consent was obtained and placed in the medical record.  PROCEDURE IN DETAIL:  Patient being Jacob [name], was verified. Procedure being transurethral resection of bladder tumor with retrograde pyelography was confirmed.  Procedure was carried out.  Time-out was performed.  Intravenous antibiotics were administered.  General LMA anesthesia  was introduced.  The patient was placed into a low lithotomy position and a sterile field was created by prepping and draping the patient's penis, perineum, and proximal thighs using iodine x3.  Next, cystourethroscopy was performed using 22-French rigid cystoscope with 12- degree offset lens.  Inspection of the anterior and posterior urethra were unremarkable.  Resection of the urinary bladder revealed multifocal papillary bladder tumors including right upper wall foci and multifocal left lateral wall foci were significantly superior to the area of prior left ureteral orifice, which had previously been resected with nephroureterectomy.  The right ureteral orifice was in normal anatomic position with efflux of clear urine.  The right ureteral orifice was cannulated with a 6-French end-hole catheter and right retrograde pyelogram was obtained.  Right retrograde pyelogram demonstrated a single right ureter with single-system right kidney.  No filling defects or narrowing noted.  The kidney was somewhat bifid in appearance with a separate upper pole calyx and small infundibulum connecting this to the main collecting system and renal pelvis area.  The cystoscope was then exchanged with 26-French ACMI continuous-flow resectoscope sheath, and very careful systematic resection was performed of all these aforementioned foci of tumor down what appeared to be fibromuscular stroma.  These collected tumor fragments were set aside for permanent pathology, labeled bladder tumor. Next, cold cup biopsy forceps were used  to obtain representative deep muscular bites and sent separately as deep margin of bladder tumor. Following these maneuvers, additional hemostasis was achieved with coagulation current using the resectoscope loop and there appeared to be excellent hemostasis.  There was no evidence of bladder perforation and it was not felt that catheterization would be warranted.  Bladder was emptied  per cystoscope.  Procedure was then terminated.  The patient tolerated the procedure well.  There were no immediate periprocedural complications.  The patient was taken to the Postanesthesia Care Unit in stable condition.          ______________________________ [name], MD     TM/MEDQ  D:  01/13/2014  T:  01/13/2014  Job:  [id number]

## 2014-03-04 ENCOUNTER — Encounter: Payer: Self-pay | Admitting: Internal Medicine

## 2014-03-04 ENCOUNTER — Ambulatory Visit (INDEPENDENT_AMBULATORY_CARE_PROVIDER_SITE_OTHER): Payer: Medicare Other | Admitting: *Deleted

## 2014-03-04 DIAGNOSIS — I441 Atrioventricular block, second degree: Secondary | ICD-10-CM

## 2014-03-04 LAB — MDC_IDC_ENUM_SESS_TYPE_REMOTE
Brady Statistic AP VP Percent: 2 %
Brady Statistic AP VS Percent: 6 %
Brady Statistic AS VS Percent: 47 %
Date Time Interrogation Session: 20160302124454
Lead Channel Impedance Value: 514 Ohm
Lead Channel Impedance Value: 566 Ohm
Lead Channel Pacing Threshold Pulse Width: 0.4 ms
Lead Channel Sensing Intrinsic Amplitude: 16 mV
Lead Channel Sensing Intrinsic Amplitude: 2.8 mV
Lead Channel Setting Pacing Amplitude: 2.5 V
Lead Channel Setting Pacing Pulse Width: 0.4 ms
Lead Channel Setting Sensing Sensitivity: 2.8 mV
MDC IDC MSMT BATTERY IMPEDANCE: 100 Ohm
MDC IDC MSMT BATTERY REMAINING LONGEVITY: 154 mo
MDC IDC MSMT BATTERY VOLTAGE: 2.8 V
MDC IDC MSMT LEADCHNL RA PACING THRESHOLD AMPLITUDE: 0.625 V
MDC IDC MSMT LEADCHNL RA PACING THRESHOLD PULSEWIDTH: 0.4 ms
MDC IDC MSMT LEADCHNL RV PACING THRESHOLD AMPLITUDE: 0.75 V
MDC IDC SET LEADCHNL RA PACING AMPLITUDE: 2 V
MDC IDC STAT BRADY AS VP PERCENT: 46 %

## 2014-03-04 NOTE — Progress Notes (Signed)
Remote pacemaker transmission.   

## 2014-04-06 ENCOUNTER — Encounter: Payer: Self-pay | Admitting: Cardiology

## 2014-06-08 ENCOUNTER — Ambulatory Visit (INDEPENDENT_AMBULATORY_CARE_PROVIDER_SITE_OTHER): Payer: Medicare Other | Admitting: *Deleted

## 2014-06-08 DIAGNOSIS — I441 Atrioventricular block, second degree: Secondary | ICD-10-CM

## 2014-06-09 NOTE — Progress Notes (Signed)
Remote pacemaker transmission.   

## 2014-06-11 LAB — CUP PACEART REMOTE DEVICE CHECK
Battery Impedance: 100 Ohm
Battery Voltage: 2.8 V
Brady Statistic AP VP Percent: 3 %
Brady Statistic AP VS Percent: 5 %
Brady Statistic AS VP Percent: 56 %
Date Time Interrogation Session: 20160606130336
Lead Channel Impedance Value: 518 Ohm
Lead Channel Impedance Value: 528 Ohm
Lead Channel Pacing Threshold Amplitude: 0.75 V
Lead Channel Pacing Threshold Pulse Width: 0.4 ms
Lead Channel Pacing Threshold Pulse Width: 0.4 ms
Lead Channel Sensing Intrinsic Amplitude: 16 mV
Lead Channel Sensing Intrinsic Amplitude: 2.8 mV
Lead Channel Setting Pacing Amplitude: 2 V
Lead Channel Setting Pacing Pulse Width: 0.4 ms
Lead Channel Setting Sensing Sensitivity: 2.8 mV
MDC IDC MSMT BATTERY REMAINING LONGEVITY: 162 mo
MDC IDC MSMT LEADCHNL RV PACING THRESHOLD AMPLITUDE: 0.75 V
MDC IDC SET LEADCHNL RV PACING AMPLITUDE: 2.5 V
MDC IDC STAT BRADY AS VS PERCENT: 36 %

## 2014-07-01 ENCOUNTER — Encounter: Payer: Self-pay | Admitting: Cardiology

## 2014-07-08 ENCOUNTER — Encounter: Payer: Self-pay | Admitting: Internal Medicine

## 2014-08-18 ENCOUNTER — Encounter: Payer: Self-pay | Admitting: Internal Medicine

## 2014-08-18 ENCOUNTER — Ambulatory Visit (INDEPENDENT_AMBULATORY_CARE_PROVIDER_SITE_OTHER): Payer: Medicare Other | Admitting: Internal Medicine

## 2014-08-18 VITALS — BP 180/72 | HR 59 | Ht 69.0 in | Wt 190.8 lb

## 2014-08-18 DIAGNOSIS — R0789 Other chest pain: Secondary | ICD-10-CM

## 2014-08-18 DIAGNOSIS — R001 Bradycardia, unspecified: Secondary | ICD-10-CM

## 2014-08-18 DIAGNOSIS — Z950 Presence of cardiac pacemaker: Secondary | ICD-10-CM

## 2014-08-18 DIAGNOSIS — I441 Atrioventricular block, second degree: Secondary | ICD-10-CM | POA: Diagnosis not present

## 2014-08-18 LAB — CUP PACEART INCLINIC DEVICE CHECK
Battery Impedance: 110 Ohm
Battery Voltage: 2.8 V
Brady Statistic AP VS Percent: 7.3 %
Brady Statistic AS VS Percent: 49.4 %
Date Time Interrogation Session: 20160816152008
Lead Channel Pacing Threshold Amplitude: 0.75 V
Lead Channel Pacing Threshold Amplitude: 0.75 V
Lead Channel Pacing Threshold Pulse Width: 0.4 ms
Lead Channel Pacing Threshold Pulse Width: 0.4 ms
Lead Channel Sensing Intrinsic Amplitude: 8 mV
Lead Channel Setting Pacing Amplitude: 2.5 V
Lead Channel Setting Pacing Pulse Width: 0.4 ms
Lead Channel Setting Sensing Sensitivity: 2.8 mV
MDC IDC MSMT LEADCHNL RA IMPEDANCE VALUE: 521 Ohm
MDC IDC MSMT LEADCHNL RA SENSING INTR AMPL: 4 mV
MDC IDC MSMT LEADCHNL RV IMPEDANCE VALUE: 521 Ohm
MDC IDC SET LEADCHNL RA PACING AMPLITUDE: 2 V
MDC IDC STAT BRADY AP VP PERCENT: 1.9 %
MDC IDC STAT BRADY AS VP PERCENT: 41.3 %

## 2014-08-18 MED ORDER — AMLODIPINE BESYLATE 10 MG PO TABS: 10.0000 mg | ORAL_TABLET | Freq: Every day | ORAL | Status: DC

## 2014-08-18 NOTE — Progress Notes (Signed)
Patient Care Team: [name], MD as PCP - General (Internal Medicine)   HPI  Jacob [name] is a 81 y.o. male Seen in follow-up for a pacemaker implanted 8/15 by Dr. Harriet Masson. for symptomatic bradycardia and 2-1 heart block.  He had an antecedent history of left bundle branch block and coronary artery disease with preserved LV function. Echo 8/15 demonstrated EF 60-65% without wall motion abnormalities.  He has a history of atrial fibrillation and hypertension.  He was discharged with aspirin  no atrial fibrillation however has been recorded on his device.  Records and Results Reviewed included office records from Dr. Vedia Coffer demonstrated an LDL of 95 and a clear chest x-ray report  Past Medical History  Diagnosis Date  . GERD (gastroesophageal reflux disease)   . H/O hiatal hernia   . Arthritis     hands  . BPH (benign prostatic hypertrophy)   . History of atrial fibrillation     a. 2006.  . Glaucoma of both eyes   . Hyperlipidemia   . History of diverticulitis   . History of kidney stones   . Hyperlipidemia   . Renal insufficiency     a. Creat rose to 2.34 post-op L nephrectomy.  . Hypertension   . LBBB (left bundle branch block)   . Mobitz type 2 second degree AV block   . Symptomatic bradycardia     s/p  pacemaker placement 08-29-2013  . S/P placement of cardiac pacemaker MEDTRONIC last pacer check 12-01-2013    08-29-2013  for symptomatic bradycardia and mobitz 2 second degree heart block  . History of renal cell carcinoma     08-22-2013  s/p  left nephrouretectomy  . Recurrent bladder transitional cell carcinoma monitored by dr [name]    s/p turb's  and chemo bladder instillations  . Coronary artery disease cardiologist-- dr Fletcher Anon  and dr gregg taylor    a. 2006 s/p PCI/Stenting x 2 to the RCA;  b. 05/03/12 Cardiac CT: Patent RCA stent, nonobs dzs;  c. 01/2013 Ex MV: nl EF, no ischemia.  . At risk for sleep apnea     STOP-BANG=  4     SENT TO PCP  06-19-5013    Past Surgical History  Procedure Laterality Date  . Cystoscopy with biopsy  04/13/2011      Procedure: CYSTOSCOPY WITH BIOPSY;  Surgeon: [name], MD;  Location: [facility];  Service: Urology;  Laterality: N/A;  Cold Cup Biopsy  . Transurethral resection of bladder tumor  12-22-2010  . Appendectomy  02/1988  . Cataract extraction w/ intraocular lens  implant, bilateral  2011  . Transurethral resection of bladder tumor  08/04/2011    Procedure: TRANSURETHRAL RESECTION OF BLADDER TUMOR (TURBT);  Surgeon: [name], MD;  Location: [facility];  Service: Urology;  Laterality: N/A;  . Coronary angioplasty with stent placement  2006    [facility]    X2 STENTS  TO RCA  . Cystoscopy with retrograde pyelogram, ureteroscopy and stent placement Left 06/19/2013    Procedure: CYSTOSCOPY WITH LEFT RETROGRADE PYELOGRAM, LEFT URETEROSCOPY, ureteral balloon dilatation, BIOPSY LEFT KIDNEY;  Surgeon: [name], MD;  Location: [facility];  Service: Urology;  Laterality: Left;  . Robot assited laparoscopic nephroureterectomy Left 08/22/2013    Procedure: ROBOT ASSITED LAPAROSCOPIC NEPHROURETERECTOMY;  Surgeon: [name], MD;  [facility]: [facility];  Service: Urology;  Laterality: Left;  . Cystoscopy w/ ureteral stent placement Left 08/22/2013  Procedure: CYSTOSCOPY WITH RETROGRADE PYELOGRAM/URETERAL STENT PLACEMENT;  Surgeon: [name], MD;  [facility];  Service: Urology;  Laterality: Left;  . Inguinal hernia repair Left 08/22/2013    Procedure: LAPAROSCOPIC INGUINAL HERNIA;  Surgeon: [name], MD;  [facility]: [facility];  Service: Urology;  Laterality: Left;  . Permanent pacemaker insertion N/A 08/29/2013    Procedure: PERMANENT PACEMAKER INSERTION;  Surgeon: [name], MD;  [facility] C[facility];  Service: Cardiovascular;  Laterality: N/A;  Medtronic dual-chamber  . Transthoracic echocardiogram  08-29-2013    mild LVH/  ef 60-65% /   grade I diastolic dysfunction/  mild MR/  mild LAE  and RAE/  moderate TR  . Transurethral resection of bladder tumor N/A 01/13/2014    Procedure: TRANSURETHRAL RESECTION OF BLADDER TUMOR (TURBT);  Surgeon: [name], MD;  Location: [facility];  Service: Urology;  Laterality: N/A;  . Cystoscopy w/ retrogrades Right 01/13/2014    Procedure: CYSTOSCOPY WITH RETROGRADE PYELOGRAM;  Surgeon: [name], MD;  Location: [facility];  Service: Urology;  Laterality: Right;    Current Outpatient Prescriptions  Medication Sig Dispense Refill  . acetaminophen (TYLENOL) 325 MG tablet Take 650 mg by mouth every 4 (four) hours as needed.    Marland Kitchen amLODipine (NORVASC) 5 MG tablet Take 1 tablet (5 mg total) by mouth daily. (Patient taking differently: Take 5 mg by mouth every morning. ) 30 tablet 5  . aspirin 81 MG tablet Take 81 mg by mouth daily.    . cetirizine (ZYRTEC) 10 MG tablet Take 10 mg by mouth every morning. ALLERGIES     . isosorbide mononitrate (IMDUR) 30 MG 24 hr tablet Take 30 mg by mouth at bedtime.     Marland Kitchen latanoprost (XALATAN) 0.005 % ophthalmic solution Place 1 drop into both eyes at bedtime.     Marland Kitchen losartan (COZAAR) 50 MG tablet Take 1 tablet by mouth every morning.     . lovastatin (MEVACOR) 20 MG tablet Take 20 mg by mouth at bedtime.     . Multiple Vitamins-Minerals (MULTIVITAMINS THER. W/MINERALS) TABS Take 1 tablet by mouth daily.     . Omega-3 Fatty Acids (OMEGA 3 PO) Take 1,200 mg by mouth 2 (two) times daily.     Marland Kitchen omeprazole (PRILOSEC) 20 MG capsule Take 20 mg by mouth every morning.     . senna-docusate (SENOKOT-S) 8.6-50 MG per tablet Take 1 tablet by mouth 2 (two) times daily. While taking pain meds to prevent constipation (Patient taking differently: Take 1 tablet by mouth daily. While taking pain meds to prevent constipation) 30 tablet 0   No current facility-administered medications for this visit.    Allergies  Allergen Reactions  .  Penicillins Hives    BLISTERS      Review of Systems negative except from HPI and PMH  Physical Exam BP 180/72 mmHg  Pulse 59  Ht '5\' 9"'$  (1.753 m)  Wt 190 lb 12 oz (86.524 kg)  BMI 28.16 kg/m2 Well developed and well nourished in no acute distress HENT normal E scleral and icterus clear Neck Supple JVP flat; carotids brisk and full Clear to ausculation Device pocket well healed; without hematoma or erythema.  There is no tethering Regular rate and rhythm, no murmurs gallops or rub Soft with active bowel sounds lower abdominal scar No clubbing cyanosis no Edema Alert and oriented, grossly normal motor and sensory function Skin Warm and Dry  ECG demonstrated sinus rhythm at 59 Intervals 23/14/45 Right  bundle branch block  Assessment and  Plan  Hypertension  Symptomatic bradycardia with 2 to one block  Bidirectional bundle branch block  Pacemaker-Medtronic The patient's device was interrogated.  The information was reviewed. No changes were made in the programming.      Overall he is well. His blood pressure is poorly controlled. We will increase his amlodipine from 5--10. He isNo episodes of dizziness or lightheadedness. to follow-up with his PCP in a couple of weeks where further modifications of his blood pressure can be pursued

## 2014-08-18 NOTE — Patient Instructions (Addendum)
Medication Instructions: - Increase amlodipine to 10 mg once daily  Labwork: - none  Procedures/Testing: - none  Follow-Up: - Remote monitoring is used to monitor your Pacemaker of ICD from home. This monitoring reduces the number of office visits required to check your device to one time per year. It allows [place] to keep an eye on the functioning of your device to ensure it is working properly. You are scheduled for a device check from home on 11/17/14. You may send your transmission at any time that day. If you have a wireless device, the transmission will be sent automatically. After your physician reviews your transmission, you will receive a postcard with your next transmission date.  - Your physician wants you to follow-up in: 1 year with Dr. Caryl Comes. You will receive a reminder letter in the mail two months in advance. If you don't receive a letter, please call our office to schedule the follow-up appointment.  Any Additional Special Instructions Will Be Listed Below (If Applicable).

## 2014-08-25 ENCOUNTER — Encounter: Payer: Self-pay | Admitting: Internal Medicine

## 2014-11-17 ENCOUNTER — Ambulatory Visit (INDEPENDENT_AMBULATORY_CARE_PROVIDER_SITE_OTHER): Payer: Medicare Other | Admitting: *Deleted

## 2014-11-17 DIAGNOSIS — I441 Atrioventricular block, second degree: Secondary | ICD-10-CM | POA: Diagnosis not present

## 2014-11-17 NOTE — Progress Notes (Signed)
Remote pacemaker transmission.   

## 2014-11-25 LAB — CUP PACEART REMOTE DEVICE CHECK
Brady Statistic AP VS Percent: 7 %
Brady Statistic AS VS Percent: 52 %
Date Time Interrogation Session: 20161115125651
Implantable Lead Implant Date: 20150903
Implantable Lead Location: 753859
Implantable Lead Location: 753860
Implantable Lead Model: 5076
Lead Channel Pacing Threshold Amplitude: 0.75 V
Lead Channel Pacing Threshold Amplitude: 0.875 V
Lead Channel Pacing Threshold Pulse Width: 0.4 ms
Lead Channel Sensing Intrinsic Amplitude: 2.8 mV
Lead Channel Setting Pacing Amplitude: 2.5 V
Lead Channel Setting Pacing Pulse Width: 0.4 ms
Lead Channel Setting Sensing Sensitivity: 2.8 mV
MDC IDC LEAD IMPLANT DT: 20150903
MDC IDC MSMT BATTERY IMPEDANCE: 110 Ohm
MDC IDC MSMT BATTERY REMAINING LONGEVITY: 152 mo
MDC IDC MSMT BATTERY VOLTAGE: 2.8 V
MDC IDC MSMT LEADCHNL RA IMPEDANCE VALUE: 499 Ohm
MDC IDC MSMT LEADCHNL RA PACING THRESHOLD PULSEWIDTH: 0.4 ms
MDC IDC MSMT LEADCHNL RV IMPEDANCE VALUE: 529 Ohm
MDC IDC MSMT LEADCHNL RV SENSING INTR AMPL: 8 mV
MDC IDC SET LEADCHNL RA PACING AMPLITUDE: 2 V
MDC IDC STAT BRADY AP VP PERCENT: 1 %
MDC IDC STAT BRADY AS VP PERCENT: 39 %

## 2015-02-16 ENCOUNTER — Ambulatory Visit (INDEPENDENT_AMBULATORY_CARE_PROVIDER_SITE_OTHER): Payer: Medicare Other | Admitting: *Deleted

## 2015-02-16 ENCOUNTER — Telehealth: Payer: Self-pay | Admitting: Cardiology

## 2015-02-16 DIAGNOSIS — I441 Atrioventricular block, second degree: Secondary | ICD-10-CM | POA: Diagnosis not present

## 2015-02-16 NOTE — Telephone Encounter (Signed)
Spoke with pt and reminded pt of remote transmission that is due today. Pt verbalized understanding.   

## 2015-02-16 NOTE — Progress Notes (Signed)
Remote pacemaker transmission.   

## 2015-02-17 ENCOUNTER — Encounter: Payer: Self-pay | Admitting: Cardiology

## 2015-02-17 LAB — CUP PACEART REMOTE DEVICE CHECK
Battery Remaining Longevity: 146 mo
Battery Voltage: 2.8 V
Brady Statistic AP VP Percent: 5 %
Brady Statistic AP VS Percent: 5 %
Brady Statistic AS VP Percent: 61 %
Implantable Lead Implant Date: 20150903
Implantable Lead Implant Date: 20150903
Implantable Lead Model: 5076
Implantable Lead Model: 5076
Lead Channel Impedance Value: 529 Ohm
Lead Channel Pacing Threshold Amplitude: 0.75 V
Lead Channel Pacing Threshold Amplitude: 0.75 V
Lead Channel Pacing Threshold Pulse Width: 0.4 ms
Lead Channel Pacing Threshold Pulse Width: 0.4 ms
Lead Channel Sensing Intrinsic Amplitude: 2.8 mV
Lead Channel Setting Pacing Amplitude: 2 V
Lead Channel Setting Pacing Amplitude: 2.5 V
Lead Channel Setting Sensing Sensitivity: 2.8 mV
MDC IDC LEAD LOCATION: 753859
MDC IDC LEAD LOCATION: 753860
MDC IDC MSMT BATTERY IMPEDANCE: 110 Ohm
MDC IDC MSMT LEADCHNL RA IMPEDANCE VALUE: 478 Ohm
MDC IDC MSMT LEADCHNL RV SENSING INTR AMPL: 8 mV
MDC IDC SESS DTM: 20170214171151
MDC IDC SET LEADCHNL RV PACING PULSEWIDTH: 0.4 ms
MDC IDC STAT BRADY AS VS PERCENT: 29 %

## 2015-03-27 ENCOUNTER — Emergency Department
Admission: EM | Admit: 2015-03-27 | Discharge: 2015-03-28 | Payer: Medicare Other | Attending: Emergency Medicine | Admitting: Emergency Medicine

## 2015-03-27 ENCOUNTER — Inpatient Hospital Stay
Admission: AD | Admit: 2015-03-27 | Payer: Self-pay | Source: Other Acute Inpatient Hospital | Admitting: Pulmonary Disease

## 2015-03-27 ENCOUNTER — Encounter: Payer: Self-pay | Admitting: Emergency Medicine

## 2015-03-27 ENCOUNTER — Emergency Department: Payer: Medicare Other

## 2015-03-27 DIAGNOSIS — Z8679 Personal history of other diseases of the circulatory system: Secondary | ICD-10-CM | POA: Insufficient documentation

## 2015-03-27 DIAGNOSIS — I2510 Atherosclerotic heart disease of native coronary artery without angina pectoris: Secondary | ICD-10-CM | POA: Insufficient documentation

## 2015-03-27 DIAGNOSIS — Z950 Presence of cardiac pacemaker: Secondary | ICD-10-CM | POA: Insufficient documentation

## 2015-03-27 DIAGNOSIS — E785 Hyperlipidemia, unspecified: Secondary | ICD-10-CM | POA: Diagnosis not present

## 2015-03-27 DIAGNOSIS — J810 Acute pulmonary edema: Secondary | ICD-10-CM | POA: Diagnosis not present

## 2015-03-27 DIAGNOSIS — R0902 Hypoxemia: Secondary | ICD-10-CM | POA: Insufficient documentation

## 2015-03-27 DIAGNOSIS — N400 Enlarged prostate without lower urinary tract symptoms: Secondary | ICD-10-CM | POA: Diagnosis not present

## 2015-03-27 DIAGNOSIS — J189 Pneumonia, unspecified organism: Secondary | ICD-10-CM | POA: Insufficient documentation

## 2015-03-27 DIAGNOSIS — Z87891 Personal history of nicotine dependence: Secondary | ICD-10-CM | POA: Diagnosis not present

## 2015-03-27 DIAGNOSIS — R05 Cough: Secondary | ICD-10-CM | POA: Diagnosis present

## 2015-03-27 DIAGNOSIS — Z95818 Presence of other cardiac implants and grafts: Secondary | ICD-10-CM | POA: Insufficient documentation

## 2015-03-27 DIAGNOSIS — C679 Malignant neoplasm of bladder, unspecified: Secondary | ICD-10-CM | POA: Diagnosis not present

## 2015-03-27 DIAGNOSIS — I119 Hypertensive heart disease without heart failure: Secondary | ICD-10-CM | POA: Insufficient documentation

## 2015-03-27 DIAGNOSIS — Z85528 Personal history of other malignant neoplasm of kidney: Secondary | ICD-10-CM | POA: Insufficient documentation

## 2015-03-27 LAB — CBC WITH DIFFERENTIAL/PLATELET
BASOS ABS: 0 10*3/uL (ref 0–0.1)
Basophils Relative: 0 %
Eosinophils Absolute: 0 10*3/uL (ref 0–0.7)
Eosinophils Relative: 0 %
HEMATOCRIT: 36.9 % — AB (ref 40.0–52.0)
HEMOGLOBIN: 12 g/dL — AB (ref 13.0–18.0)
Lymphocytes Relative: 16 %
Lymphs Abs: 1.1 10*3/uL (ref 1.0–3.6)
MCH: 30.4 pg (ref 26.0–34.0)
MCHC: 32.6 g/dL (ref 32.0–36.0)
MCV: 93.2 fL (ref 80.0–100.0)
Monocytes Absolute: 0.9 10*3/uL (ref 0.2–1.0)
Monocytes Relative: 13 %
NEUTROS ABS: 5.1 10*3/uL (ref 1.4–6.5)
NEUTROS PCT: 71 %
PLATELETS: 237 10*3/uL (ref 150–440)
RBC: 3.96 MIL/uL — ABNORMAL LOW (ref 4.40–5.90)
RDW: 14.1 % (ref 11.5–14.5)
WBC: 7.1 10*3/uL (ref 3.8–10.6)

## 2015-03-27 LAB — BASIC METABOLIC PANEL
ANION GAP: 4 — AB (ref 5–15)
BUN: 30 mg/dL — AB (ref 6–20)
CHLORIDE: 106 mmol/L (ref 101–111)
CO2: 31 mmol/L (ref 22–32)
Calcium: 8.4 mg/dL — ABNORMAL LOW (ref 8.9–10.3)
Creatinine, Ser: 1.82 mg/dL — ABNORMAL HIGH (ref 0.61–1.24)
GFR calc Af Amer: 38 mL/min — ABNORMAL LOW (ref >60)
GFR, EST NON AFRICAN AMERICAN: 32 mL/min — AB (ref >60)
GLUCOSE: 146 mg/dL — AB (ref 65–99)
POTASSIUM: 4.4 mmol/L (ref 3.5–5.1)
Sodium: 141 mmol/L (ref 135–145)

## 2015-03-27 LAB — TROPONIN I: Troponin I: 0.05 ng/mL — ABNORMAL HIGH (ref <0.031)

## 2015-03-27 LAB — BRAIN NATRIURETIC PEPTIDE: B Natriuretic Peptide: 642 pg/mL — ABNORMAL HIGH (ref 0.0–100.0)

## 2015-03-27 MED ORDER — DEXTROSE 5 % IV SOLN
500.0000 mg | INTRAVENOUS | Status: DC
  Administered 2015-03-27: 500 mg via INTRAVENOUS
  Filled 2015-03-27: qty 500

## 2015-03-27 MED ORDER — ALBUTEROL SULFATE (2.5 MG/3ML) 0.083% IN NEBU
5.0000 mg | INHALATION_SOLUTION | Freq: Once | RESPIRATORY_TRACT | Status: AC
  Administered 2015-03-27: 5 mg via RESPIRATORY_TRACT
  Filled 2015-03-27: qty 6

## 2015-03-27 MED ORDER — CEFTRIAXONE SODIUM 1 G IJ SOLR
1.0000 g | Freq: Once | INTRAMUSCULAR | Status: AC
  Administered 2015-03-27: 1 g via INTRAVENOUS
  Filled 2015-03-27: qty 10

## 2015-03-27 MED ORDER — FUROSEMIDE 10 MG/ML IJ SOLN
40.0000 mg | Freq: Once | INTRAMUSCULAR | Status: AC
  Administered 2015-03-27: 40 mg via INTRAVENOUS
  Filled 2015-03-27: qty 4

## 2015-03-27 NOTE — ED Notes (Signed)
Patient brought to ED room 14 from triage on 5 liter [place], patient reports that approx 5 weeks ago he was treated for bronchitis and a sinus infection. Patient was given antibiotics and prednisone. Patient states that after finishing the medication his symptoms started to flare up again. Last night patient was unable to sleep laying flat.  Patient O2 sats currently between 89-91% on 5 liter [place], patient with accessory muscle use. Non-rebreather applied, patients sats improved to 100%

## 2015-03-27 NOTE — ED Notes (Signed)
Cough x 4 weeks

## 2015-03-27 NOTE — ED Notes (Signed)
Pt's family updated on bed assignment situation, [facility] at [facility] may not be available until tomorrow afternoon.  Per son, prefer to transfer to [facility] if possible, if not, [facility] okay.  Dr. Marcelene Butte to be notified of pt's family decision.

## 2015-03-27 NOTE — ED Provider Notes (Signed)
-----------------------------------------   11:04 PM on 03/27/2015 -----------------------------------------   Blood pressure 140/56, pulse 63, temperature 98.4 F (36.9 C), temperature source Oral, resp. rate 21, height '5\' 9"'$  (1.753 m), weight 195 lb (88.451 kg), SpO2 98 %.  Assuming care from Dr. Silverio Lay.  In short, Jacob [name] is a 82 y.o. male with a chief complaint of Cough .  Refer to the original H&P for additional details.  The current plan of care [facility]. [facility] by [facility] but apparently no beds were available and he cannot give [place] any timeline when a bed would become available and was intubated that he could not occur possibly until tomorrow. He was felt that the patient still required inpatient ICU management and when given the options patient's family selected [facility]. I been in discussion reviewed the case with the intensivist at [facility] and the patient was accepted. Appropriate and power of paperwork was filled out and the patient will likely be transported by local EMS services.   Daymon Larsen, MD 03/27/15 [phone]

## 2015-03-27 NOTE — ED Provider Notes (Addendum)
[facility] Emergency Department Provider Note  Time seen: 4:33 PM  I have reviewed the triage vital signs and the nursing notes.   HISTORY  Chief Complaint Cough    HPI Jacob [name] is a 82 y.o. male with a past medical history of gastric reflux, A. fib, hyperlipidemia, hypertension, COPD, CAD status post cardiac stents, presents the emergency department with difficulty breathing. According to the patient for the last3 weeks he has been progressively more short of breath. States it has come much worse over the past several days. Upon arrival to the emergency department patient has coarse breath sounds, with a room air saturation of 75%. Saturation increased to 89% on 5 L, patient placed on nonrebreather. Patient denies any chest pain. Does state lower extremity swelling which is new for the patient. Describes shortness breath as significant. Occasional cough. White sputum production.    Past Medical History  Diagnosis Date  . GERD (gastroesophageal reflux disease)   . H/O hiatal hernia   . Arthritis     hands  . BPH (benign prostatic hypertrophy)   . History of atrial fibrillation     a. 2006.  . Glaucoma of both eyes   . Hyperlipidemia   . History of diverticulitis   . History of kidney stones   . Hyperlipidemia   . Renal insufficiency     a. Creat rose to 2.34 post-op L nephrectomy.  . Hypertension   . LBBB (left bundle branch block)   . Mobitz type 2 second degree AV block   . Symptomatic bradycardia     s/p  pacemaker placement 08-29-2013  . S/P placement of cardiac pacemaker MEDTRONIC last pacer check 12-01-2013    08-29-2013  for symptomatic bradycardia and mobitz 2 second degree heart block  . History of renal cell carcinoma     08-22-2013  s/p  left nephrouretectomy  . Recurrent bladder transitional cell carcinoma ([place]) monitored by dr [name]    s/p turb's  and chemo bladder instillations  . Coronary artery disease  cardiologist-- dr Fletcher Anon  and dr gregg taylor    a. 2006 s/p PCI/Stenting x 2 to the RCA;  b. 05/03/12 Cardiac CT: Patent RCA stent, nonobs dzs;  c. 01/2013 Ex MV: nl EF, no ischemia.  . At risk for sleep apnea     STOP-BANG=  4     SENT TO PCP 06-19-5013    Patient Active Problem List   Diagnosis Date Noted  . Pacemaker 12/01/2013  . Mobitz type 2 second degree AV block 12/01/2013  . Bradycardia 08/28/2013  . Symptomatic bradycardia 08/28/2013  . Sinus bradycardia   . Renal insufficiency   . HTN (hypertension)   . Left Renal Pelvis Cancer   . Urothelial carcinoma of kidney ([place]) 08/22/2013  . Left bundle branch block 02/14/2013  . Coronary artery disease   . Hyperlipidemia   . Hypertension     Past Surgical History  Procedure Laterality Date  . Cystoscopy with biopsy  04/13/2011      Procedure: CYSTOSCOPY WITH BIOPSY;  Surgeon: [name], MD;  Location: [facility];  Service: Urology;  Laterality: N/A;  Cold Cup Biopsy  . Transurethral resection of bladder tumor  12-22-2010  . Appendectomy  02/1988  . Cataract extraction w/ intraocular lens  implant, bilateral  2011  . Transurethral resection of bladder tumor  08/04/2011    Procedure: TRANSURETHRAL RESECTION OF BLADDER TUMOR (TURBT);  Surgeon: [name], MD;  Location: [facility];  Service: Urology;  Laterality: N/A;  . Coronary angioplasty with stent placement  2006    [facility]    X2 STENTS  TO RCA  . Cystoscopy with retrograde pyelogram, ureteroscopy and stent placement Left 06/19/2013    Procedure: CYSTOSCOPY WITH LEFT RETROGRADE PYELOGRAM, LEFT URETEROSCOPY, ureteral balloon dilatation, BIOPSY LEFT KIDNEY;  Surgeon: [name], MD;  Location: [facility];  Service: Urology;  Laterality: Left;  . Robot assited laparoscopic nephroureterectomy Left 08/22/2013    Procedure: ROBOT ASSITED LAPAROSCOPIC NEPHROURETERECTOMY;  Surgeon: [name], MD;  Location: [facility];  Service:  Urology;  Laterality: Left;  . Cystoscopy w/ ureteral stent placement Left 08/22/2013    Procedure: CYSTOSCOPY WITH RETROGRADE PYELOGRAM/URETERAL STENT PLACEMENT;  Surgeon: [name], MD;  [facility];  Service: Urology;  Laterality: Left;  . Inguinal hernia repair Left 08/22/2013    Procedure: LAPAROSCOPIC INGUINAL HERNIA;  Surgeon: [name], MD;  [facility];  Service: Urology;  Laterality: Left;  . Permanent pacemaker insertion N/A 08/29/2013    Procedure: PERMANENT PACEMAKER INSERTION;  Surgeon: [name], MD;  Location: [facility] CATH LAB;  Service: Cardiovascular;  Laterality: N/A;  Medtronic dual-chamber  . Transthoracic echocardiogram  08-29-2013    mild LVH/  ef 60-65% /  grade I diastolic dysfunction/  mild MR/  mild LAE  and RAE/  moderate TR  . Transurethral resection of bladder tumor N/A 01/13/2014    Procedure: TRANSURETHRAL RESECTION OF BLADDER TUMOR (TURBT);  Surgeon: [name], MD;  Location: [facility];  Service: Urology;  Laterality: N/A;  . Cystoscopy w/ retrogrades Right 01/13/2014    Procedure: CYSTOSCOPY WITH RETROGRADE PYELOGRAM;  Surgeon: [name], MD;  Location: [facility];  Service: Urology;  Laterality: Right;    Current Outpatient Rx  Name  Route  Sig  Dispense  Refill  . acetaminophen (TYLENOL) 325 MG tablet   Oral   Take 650 mg by mouth every 4 (four) hours as needed.         Marland Kitchen amLODipine (NORVASC) 10 MG tablet   Oral   Take 1 tablet (10 mg total) by mouth daily.   90 tablet   3   . aspirin 81 MG tablet   Oral   Take 81 mg by mouth daily.         . cetirizine (ZYRTEC) 10 MG tablet   Oral   Take 10 mg by mouth every morning. ALLERGIES          . isosorbide mononitrate (IMDUR) 30 MG 24 hr tablet   Oral   Take 30 mg by mouth at bedtime.          Marland Kitchen latanoprost (XALATAN) 0.005 % ophthalmic solution   Both Eyes   Place 1 drop into both eyes at bedtime.          Marland Kitchen losartan (COZAAR)  50 MG tablet   Oral   Take 1 tablet by mouth every morning.          . lovastatin (MEVACOR) 20 MG tablet   Oral   Take 20 mg by mouth at bedtime.          . Multiple Vitamins-Minerals (MULTIVITAMINS THER. W/MINERALS) TABS   Oral   Take 1 tablet by mouth daily.          . Omega-3 Fatty Acids (OMEGA 3 PO)   Oral   Take 1,200 mg by mouth 2 (two) times daily.          Marland Kitchen  omeprazole (PRILOSEC) 20 MG capsule   Oral   Take 20 mg by mouth every morning.          . senna-docusate (SENOKOT-S) 8.6-50 MG per tablet   Oral   Take 1 tablet by mouth 2 (two) times daily. While taking pain meds to prevent constipation Patient taking differently: Take 1 tablet by mouth daily. While taking pain meds to prevent constipation   30 tablet   0     Allergies Penicillins  Family History  Problem Relation Age of Onset  . Heart attack Mother   . Heart attack Father     Social History Social History  Substance Use Topics  . Smoking status: Former Smoker -- 1.00 packs/day for 50 years    Types: Cigarettes    Quit date: 11/02/2000  . Smokeless tobacco: Never Used  . Alcohol Use: No    Review of Systems Constitutional: Negative for fever. Cardiovascular: Negative for chest pain. Respiratory: Positive for shortness breath. Positive cough white sputum. Gastrointestinal: Negative for abdominal pain Musculoskeletal: Negative for back pain. Neurological: Negative for headache 10-point ROS otherwise negative.  ____________________________________________   PHYSICAL EXAM:  VITAL SIGNS: ED Triage Vitals  Enc Vitals Group     BP 03/27/15 1536 140/60 mmHg     Pulse Rate 03/27/15 1536 89     Resp 03/27/15 1536 22     Temp 03/27/15 1536 98.4 F (36.9 C)     Temp Source 03/27/15 1536 Oral     SpO2 03/27/15 1536 75 %     Weight 03/27/15 1536 195 lb (88.451 kg)     Height 03/27/15 1536 '5\' 9"'$  (1.753 m)     Head Cir --      Peak Flow --      Pain Score --      Pain Loc --       Pain Edu? --      Excl. in [place]? --     Constitutional: Alert and oriented. Well appearing and in mild distress due to dyspnea. Eyes: Normal exam ENT   Head: Normocephalic and atraumatic.   Mouth/Throat: Mucous membranes are moist. Cardiovascular: Normal rate, regular rhythm. No murmur Respiratory: Moderate tachypnea, moderate respiratory distress. Diffuse rales/wheeze, no obvious focal rhonchi. Gastrointestinal: Soft and nontender. No distention.   Musculoskeletal: Nontender with normal range of motion in all extremities. Moderate lower extremity edema bilaterally. Nontender. Neurologic:  Normal speech and language. No gross focal neurologic deficits  Psychiatric: Mood and affect are normal. Speech and behavior are normal.  ____________________________________________    EKG  EKG reviewed and interpreted by myself shows a ventricular paced rhythm at 87 bpm, widened QRS consistent with paced rhythm, left axis deviation.  ____________________________________________    RADIOLOGY  Chest x-ray consistent with multifocal pneumonia.  ____________________________________________   CRITICAL CARE Performed by: Harvest Dark   Total critical care time: 60 minutes  Critical care time was exclusive of separately billable procedures and treating other patients.  Critical care was necessary to treat or prevent imminent or life-threatening deterioration.  Critical care was time spent personally by me on the following activities: development of treatment plan with patient and/or surrogate as well as nursing, discussions with consultants, evaluation of patient's response to treatment, examination of patient, obtaining history from patient or surrogate, ordering and performing treatments and interventions, ordering and review of laboratory studies, ordering and review of radiographic studies, pulse oximetry and re-evaluation of patient's condition.    INITIAL IMPRESSION /  ASSESSMENT AND PLAN /  ED COURSE  Pertinent labs & imaging results that were available during my care of the patient were reviewed by me and considered in my medical decision making (see chart for details).  Patient presents with considerable shortness of breath, mild to moderate distress due to dyspnea. Patient presents from triage from home, with a room air saturation of 75%. Currently on a nonrebreather satting 99-100 percent. We will check labs, chest x-ray. Highly suspect CHF/pulmonary edema. Patient denies any history of CHF/pulmonary edema in the past.  Chest x-ray consistent multifocal pneumonia. They should has an elevated BNP, on my review the chest x-ray there appears to be a degree of pulmonary edema, patient also has lower extremity edema. We will cover with antibiotics. Dose Lasix.  Patient still on nonrebreather, becoming fatigued. We'll place the patient on BiPAP. We do not currently have ICU beds, the patient will need to be transferred. I will discuss with [place], and intensive care unit for transfer.  ----------------------------------------- 7:29 PM on 03/27/2015 -----------------------------------------  I was able to talk to [facility] intensive care unit. Dr. Tamala Julian has accepted the patient has a transfer. As soon as we are assigned a bed we will proceed with transfer. Patient doing much better on BiPAP.  ____________________________________________   FINAL CLINICAL IMPRESSION(S) / ED DIAGNOSES  Dyspnea Multifocal pneumonia Pulmonary edema  Harvest Dark, MD 03/27/15 1929  Harvest Dark, MD 03/27/15 2036

## 2015-03-27 NOTE — ED Notes (Signed)
02 3l applied, SAT to 88 percent, increased to 6l, to room 14.

## 2015-03-27 NOTE — ED Notes (Signed)
RT currently at bedside, patient currently being switched over to [place]

## 2015-03-28 DIAGNOSIS — C679 Malignant neoplasm of bladder, unspecified: Secondary | ICD-10-CM

## 2015-03-28 DIAGNOSIS — K219 Gastro-esophageal reflux disease without esophagitis: Secondary | ICD-10-CM | POA: Insufficient documentation

## 2015-03-28 DIAGNOSIS — J449 Chronic obstructive pulmonary disease, unspecified: Secondary | ICD-10-CM | POA: Insufficient documentation

## 2015-03-28 DIAGNOSIS — J9621 Acute and chronic respiratory failure with hypoxia: Secondary | ICD-10-CM

## 2015-03-28 HISTORY — DX: Malignant neoplasm of bladder, unspecified: C67.9

## 2015-03-28 HISTORY — DX: Acute and chronic respiratory failure with hypoxia: J96.21

## 2015-03-28 NOTE — ED Notes (Signed)
Pt transported by [facility] w/ CPAP to [place]

## 2015-04-01 LAB — CULTURE, BLOOD (ROUTINE X 2)
Culture: NO GROWTH
Culture: NO GROWTH

## 2015-05-18 ENCOUNTER — Ambulatory Visit (INDEPENDENT_AMBULATORY_CARE_PROVIDER_SITE_OTHER): Payer: Medicare Other | Admitting: *Deleted

## 2015-05-18 DIAGNOSIS — I441 Atrioventricular block, second degree: Secondary | ICD-10-CM | POA: Diagnosis not present

## 2015-05-18 NOTE — Progress Notes (Signed)
Remote pacemaker transmission.   

## 2015-06-04 LAB — CUP PACEART REMOTE DEVICE CHECK
Battery Impedance: 110 Ohm
Battery Voltage: 2.8 V
Brady Statistic AP VP Percent: 4 %
Brady Statistic AP VS Percent: 5 %
Date Time Interrogation Session: 20170516100602
Implantable Lead Implant Date: 20150903
Implantable Lead Implant Date: 20150903
Implantable Lead Location: 753859
Implantable Lead Model: 5076
Implantable Lead Model: 5076
Lead Channel Impedance Value: 478 Ohm
Lead Channel Impedance Value: 506 Ohm
Lead Channel Pacing Threshold Amplitude: 0.875 V
Lead Channel Sensing Intrinsic Amplitude: 1.4 mV
Lead Channel Setting Pacing Pulse Width: 0.4 ms
MDC IDC LEAD LOCATION: 753860
MDC IDC MSMT BATTERY REMAINING LONGEVITY: 146 mo
MDC IDC MSMT LEADCHNL RA PACING THRESHOLD PULSEWIDTH: 0.4 ms
MDC IDC MSMT LEADCHNL RV PACING THRESHOLD AMPLITUDE: 0.75 V
MDC IDC MSMT LEADCHNL RV PACING THRESHOLD PULSEWIDTH: 0.4 ms
MDC IDC MSMT LEADCHNL RV SENSING INTR AMPL: 8 mV
MDC IDC SET LEADCHNL RA PACING AMPLITUDE: 2 V
MDC IDC SET LEADCHNL RV PACING AMPLITUDE: 2.5 V
MDC IDC SET LEADCHNL RV SENSING SENSITIVITY: 2.8 mV
MDC IDC STAT BRADY AS VP PERCENT: 55 %
MDC IDC STAT BRADY AS VS PERCENT: 36 %

## 2015-06-17 ENCOUNTER — Encounter: Payer: Self-pay | Admitting: Cardiology

## 2015-08-17 ENCOUNTER — Encounter: Payer: Self-pay | Admitting: Internal Medicine

## 2015-08-17 ENCOUNTER — Ambulatory Visit (INDEPENDENT_AMBULATORY_CARE_PROVIDER_SITE_OTHER): Payer: Medicare Other | Admitting: Internal Medicine

## 2015-08-17 VITALS — BP 126/60 | HR 55 | Ht 69.0 in | Wt 199.2 lb

## 2015-08-17 DIAGNOSIS — Z950 Presence of cardiac pacemaker: Secondary | ICD-10-CM | POA: Diagnosis not present

## 2015-08-17 DIAGNOSIS — R001 Bradycardia, unspecified: Secondary | ICD-10-CM | POA: Diagnosis not present

## 2015-08-17 DIAGNOSIS — I441 Atrioventricular block, second degree: Secondary | ICD-10-CM | POA: Diagnosis not present

## 2015-08-17 DIAGNOSIS — I447 Left bundle-branch block, unspecified: Secondary | ICD-10-CM

## 2015-08-17 LAB — CUP PACEART INCLINIC DEVICE CHECK
Date Time Interrogation Session: 20170815141426
Implantable Lead Implant Date: 20150903
Implantable Lead Implant Date: 20150903
Implantable Lead Location: 753859
Implantable Lead Location: 753860
Implantable Lead Model: 5076
Implantable Lead Model: 5076

## 2015-08-17 NOTE — Patient Instructions (Addendum)
Medication Instructions: - Your physician recommends that you continue on your current medications as directed. Please refer to the Current Medication list given to you today.  Labwork: - none  Procedures/Testing: - none  Follow-Up: - Remote monitoring is used to monitor your Pacemaker of ICD from home. This monitoring reduces the number of office visits required to check your device to one time per year. It allows [place] to keep an eye on the functioning of your device to ensure it is working properly. You are scheduled for a device check from home on 11/16/15. You may send your transmission at any time that day. If you have a wireless device, the transmission will be sent automatically. After your physician reviews your transmission, you will receive a postcard with your next transmission date.  - Your physician wants you to follow-up in: 1 year with Dr. Caryl Comes. You will receive a reminder letter in the mail two months in advance. If you don't receive a letter, please call our office to schedule the follow-up appointment.   Any Additional Special Instructions Will Be Listed Below (If Applicable). ** call the office in 1-2 weeks to update Dr. Caryl Comes on how you are feeling with the device adjustments he made today** [phone]- Dr. Caryl Comes is typically here on Tuesday/ Thursday mornings in [place].   If you need a refill on your cardiac medications before your next appointment, please call your pharmacy.

## 2015-08-17 NOTE — Progress Notes (Signed)
Patient Care Team: [name], MD as PCP - General (Internal Medicine)   HPI  Jacob [name] is a 82 y.o. male Seen in follow-up for a pacemaker implanted 8/15 by Dr. Elliot Cousin thanks now I had a number. for symptomatic bradycardia and 2-1 heart block.  He had an antecedent history of left bundle branch block and coronary artery disease with preserved LV function. Echo 8/15 demonstrated EF 60-65% without wall motion abnormalities.  He has a history of atrial fibrillation and hypertension.  He was discharged with aspirin  no atrial fibrillation however has been recorded on his device.  He was seen in the hospital in March for pneumonia. These records were reviewed. At this juncture he has no complaints of chest pain or shortness of breath. He's had no edema.  Records and Results Reviewed included office records from Dr. Vedia Coffer demonstrated an LDL of 95 and a clear chest x-ray report  Past Medical History:  Diagnosis Date  . Arthritis    hands  . At risk for sleep apnea    STOP-BANG=  4     SENT TO PCP [id number]  . BPH (benign prostatic hypertrophy)   . Coronary artery disease cardiologist-- dr Fletcher Anon  and dr gregg taylor   a. 2006 s/p PCI/Stenting x 2 to the RCA;  b. 05/03/12 Cardiac CT: Patent RCA stent, nonobs dzs;  c. 01/2013 Ex MV: nl EF, no ischemia.  Marland Kitchen GERD (gastroesophageal reflux disease)   . Glaucoma of both eyes   . H/O hiatal hernia   . History of atrial fibrillation    a. 2006.  Marland Kitchen History of diverticulitis   . History of kidney stones   . History of renal cell carcinoma    08-22-2013  s/p  left nephrouretectomy  . Hyperlipidemia   . Hyperlipidemia   . Hypertension   . LBBB (left bundle branch block)   . Mobitz type 2 second degree AV block   . Recurrent bladder transitional cell carcinoma ([place]) monitored by dr [name]   s/p turb's  and chemo bladder instillations  . Renal insufficiency    a. Creat rose to 2.34 post-op L nephrectomy.  . S/P  placement of cardiac pacemaker MEDTRONIC last pacer check 12-01-2013   08-29-2013  for symptomatic bradycardia and mobitz 2 second degree heart block  . Symptomatic bradycardia    s/p  pacemaker placement 08-29-2013    Past Surgical History:  Procedure Laterality Date  . APPENDECTOMY  02/1988  . CATARACT EXTRACTION W/ INTRAOCULAR LENS  IMPLANT, BILATERAL  2011  . CORONARY ANGIOPLASTY WITH STENT PLACEMENT  2006    [facility]   X2 STENTS  TO RCA  . CYSTOSCOPY W/ RETROGRADES Right 01/13/2014   Procedure: CYSTOSCOPY WITH RETROGRADE PYELOGRAM;  Surgeon: [name], MD;  [facility];  Service: Urology;  Laterality: Right;  . CYSTOSCOPY W/ URETERAL STENT PLACEMENT Left 08/22/2013   Procedure: CYSTOSCOPY WITH RETROGRADE PYELOGRAM/URETERAL STENT PLACEMENT;  Surgeon: [name], MD;  Location[facility];  Service: Urology;  Laterality: Left;  . CYSTOSCOPY WITH BIOPSY  04/13/2011     Procedure: CYSTOSCOPY WITH BIOPSY;  Surgeon: [name], MD;  Location: [facility];  Service: Urology;  Laterality: N/A;  Cold Cup Biopsy  . CYSTOSCOPY WITH RETROGRADE PYELOGRAM, URETEROSCOPY AND STENT PLACEMENT Left 06/19/2013   Procedure: CYSTOSCOPY WITH LEFT RETROGRADE PYELOGRAM, LEFT URETEROSCOPY, ureteral balloon dilatation, BIOPSY LEFT KIDNEY;  Surgeon: [name], MD;  Location: [place];  Service: Urology;  Laterality: Left;  . INGUINAL HERNIA REPAIR Left 08/22/2013   Procedure: LAPAROSCOPIC INGUINAL HERNIA;  Surgeon: [name], MD;  [facility];  Service: Urology;  Laterality: Left;  . PERMANENT PACEMAKER INSERTION N/A 08/29/2013   Procedure: PERMANENT PACEMAKER INSERTION;  Surgeon: [name], MD;  Location: [facility];  Service: Cardiovascular;  Laterality: N/A;  Medtronic dual-chamber  . ROBOT ASSITED LAPAROSCOPIC NEPHROURETERECTOMY Left 08/22/2013   Procedure: ROBOT ASSITED LAPAROSCOPIC NEPHROURETERECTOMY;  Surgeon: [name], MD;  Location:  [facility];  Service: Urology;  Laterality: Left;  . TRANSTHORACIC ECHOCARDIOGRAM  08-29-2013   mild LVH/  ef 60-65% /  grade I diastolic dysfunction/  mild MR/  mild LAE  and RAE/  moderate TR  . TRANSURETHRAL RESECTION OF BLADDER TUMOR  12-22-2010  . TRANSURETHRAL RESECTION OF BLADDER TUMOR  08/04/2011   Procedure: TRANSURETHRAL RESECTION OF BLADDER TUMOR (TURBT);  Surgeon: [name], MD;  Location: [facility];  Service: Urology;  Laterality: N/A;  . TRANSURETHRAL RESECTION OF BLADDER TUMOR N/A 01/13/2014   Procedure: TRANSURETHRAL RESECTION OF BLADDER TUMOR (TURBT);  Surgeon: [name], MD;  Location: [facility];  Service: Urology;  Laterality: N/A;    Current Outpatient Prescriptions  Medication Sig Dispense Refill  . acetaminophen (TYLENOL) 325 MG tablet Take 650 mg by mouth every 4 (four) hours as needed.    Marland Kitchen albuterol (ACCUNEB) 0.63 MG/3ML nebulizer solution Take 1 ampule by nebulization every 6 (six) hours as needed for wheezing.    Marland Kitchen amLODipine (NORVASC) 5 MG tablet Take 5 mg by mouth daily.    Marland Kitchen aspirin 81 MG tablet Take 81 mg by mouth daily.    . cetirizine (ZYRTEC) 10 MG tablet Take 10 mg by mouth every morning. ALLERGIES     . Fluticasone-Salmeterol (ADVAIR) 100-50 MCG/DOSE AEPB Inhale 1 puff into the lungs 2 (two) times daily.    . furosemide (LASIX) 40 MG tablet Take 40 mg by mouth daily.    . isosorbide mononitrate (IMDUR) 30 MG 24 hr tablet Take 30 mg by mouth at bedtime.     Marland Kitchen latanoprost (XALATAN) 0.005 % ophthalmic solution Place 1 drop into both eyes at bedtime.     Marland Kitchen losartan (COZAAR) 50 MG tablet Take 1 tablet by mouth every morning.     . lovastatin (MEVACOR) 20 MG tablet Take 20 mg by mouth at bedtime.     . metoprolol tartrate (LOPRESSOR) 25 MG tablet Take 12.5 mg by mouth every 12 (twelve) hours.    . Multiple Vitamins-Minerals (MULTIVITAMINS THER. W/MINERALS) TABS Take 1 tablet by mouth daily.     . Omega-3 Fatty Acids  (OMEGA 3 PO) Take 1,200 mg by mouth 2 (two) times daily.     Marland Kitchen omeprazole (PRILOSEC) 20 MG capsule Take 20 mg by mouth every morning.     . senna-docusate (SENOKOT-S) 8.6-50 MG per tablet Take 1 tablet by mouth 2 (two) times daily. While taking pain meds to prevent constipation (Patient taking differently: Take 1 tablet by mouth daily as needed. While taking pain meds to prevent constipation) 30 tablet 0   No current facility-administered medications for this visit.     Allergies  Allergen Reactions  . Penicillins Hives    BLISTERS      Review of Systems negative except from HPI and PMH  Physical Exam BP 126/60 (BP Location: Left Arm, Patient Position: Sitting, Cuff Size: Normal)   Pulse (!) 55   Ht '5\' 9"'$  (1.753  m)   Wt 199 lb 4 oz (90.4 kg)   BMI 29.42 kg/m  Well developed and well nourished in no acute distress HENT normal E scleral and icterus clear Neck Supple JVP flat; carotids brisk and full Clear to ausculation Device pocket well healed; without hematoma or erythema.  There is no tethering Regular rate and rhythm, no murmurs gallops or rub Soft with active bowel sounds lower abdominal scar No clubbing cyanosis no Edema Alert and oriented, grossly normal motor and sensory function Skin Warm and Dry  ECG demonstrated AV pacing with a long AV interval at 55/m   Assessment and  Plan  Hypertension  Symptomatic bradycardia with 2:1 block  Bidirectional bundle branch block  Pacemaker-Medtronic The patient's device was interrogated.  The information was reviewed. No changes were made in the programming.     Blood pressure is much better.  Exercise intolerance may be related to chronotropic incompetence. I increased his lower rate limit 55-60, I have shortness AV delay from 300--200/180, and I have activated rate response with an upper sensor rate of 120 bpm.

## 2015-08-31 ENCOUNTER — Telehealth: Payer: Self-pay | Admitting: Internal Medicine

## 2015-08-31 NOTE — Telephone Encounter (Signed)
Per 08/17/15 office note:  ** call the office in 1-2 weeks to update Dr. Caryl Comes on how you are feeling with the device adjustments he made today** [phone]- Dr. Caryl Comes is typically here on Tuesday/ Thursday mornings in [place].   Will forward to Dr. Caryl Comes to review.

## 2015-08-31 NOTE — Telephone Encounter (Signed)
Pt states he feels a "little bit better, not a whole lot, but can walk better". He states Dr. Caryl Comes wanted him to check in and let him know how he was doing.

## 2015-11-02 ENCOUNTER — Telehealth: Payer: Self-pay | Admitting: Internal Medicine

## 2015-11-02 NOTE — Telephone Encounter (Signed)
New message  Patient Enrolled in [facility] program  Wants to confirm or deny congestive heart failure diagnosis  If so, what is recent EF?

## 2015-11-03 NOTE — Telephone Encounter (Signed)
I left a message on Jacob [name]'s identified voice mail that I see no confirmed diagnosis of CHF and last EF was 08/2013 at 60-65%

## 2015-11-16 ENCOUNTER — Ambulatory Visit (INDEPENDENT_AMBULATORY_CARE_PROVIDER_SITE_OTHER): Payer: Medicare Other | Admitting: *Deleted

## 2015-11-16 DIAGNOSIS — I441 Atrioventricular block, second degree: Secondary | ICD-10-CM

## 2015-11-16 NOTE — Progress Notes (Signed)
Remote pacemaker transmission.   

## 2015-11-19 IMAGING — RF DG RETROGRADE PYELOGRAM
1 series · 4 of 4 positions shown · non-contrast
Comparison: CT scan dated 05/27/2013

CLINICAL DATA: Cancer of the left renal pelvis.

EXAM:
RETROGRADE PYELOGRAM

[Series 1: run · 4 of 4 slices shown]
[im 1/4]
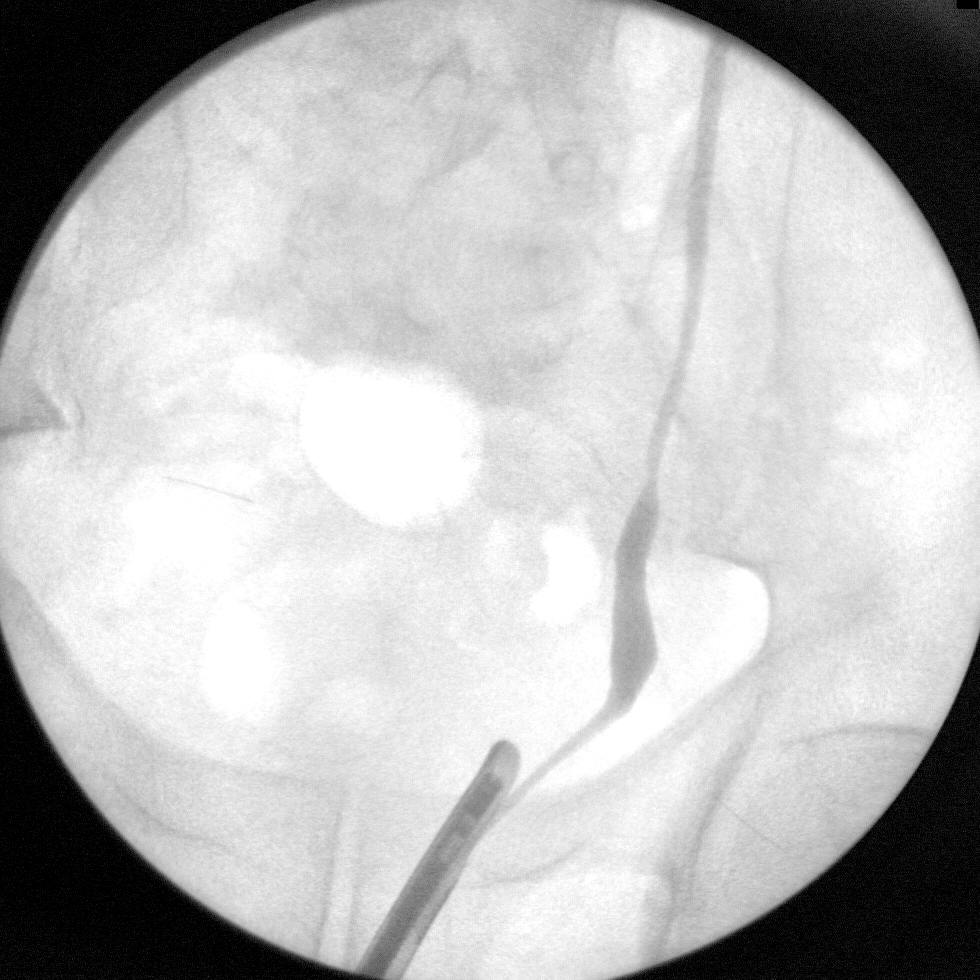
[im 2/4]
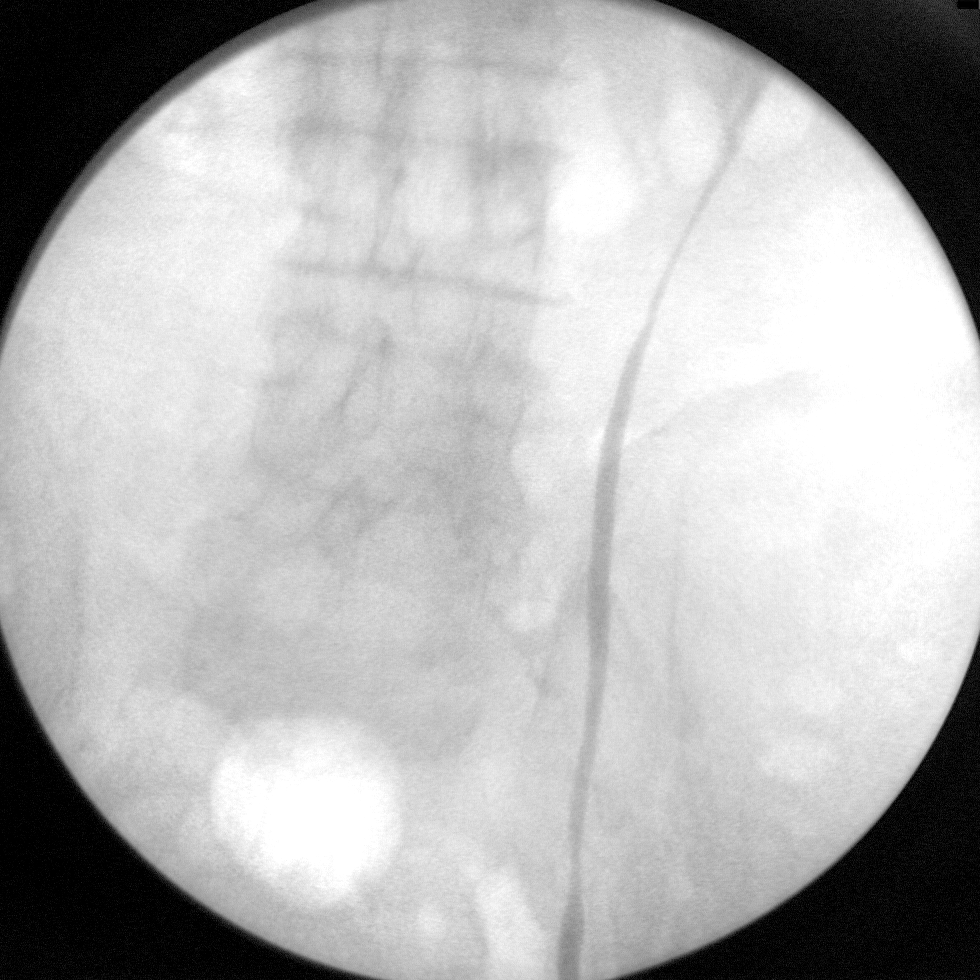
[im 3/4]
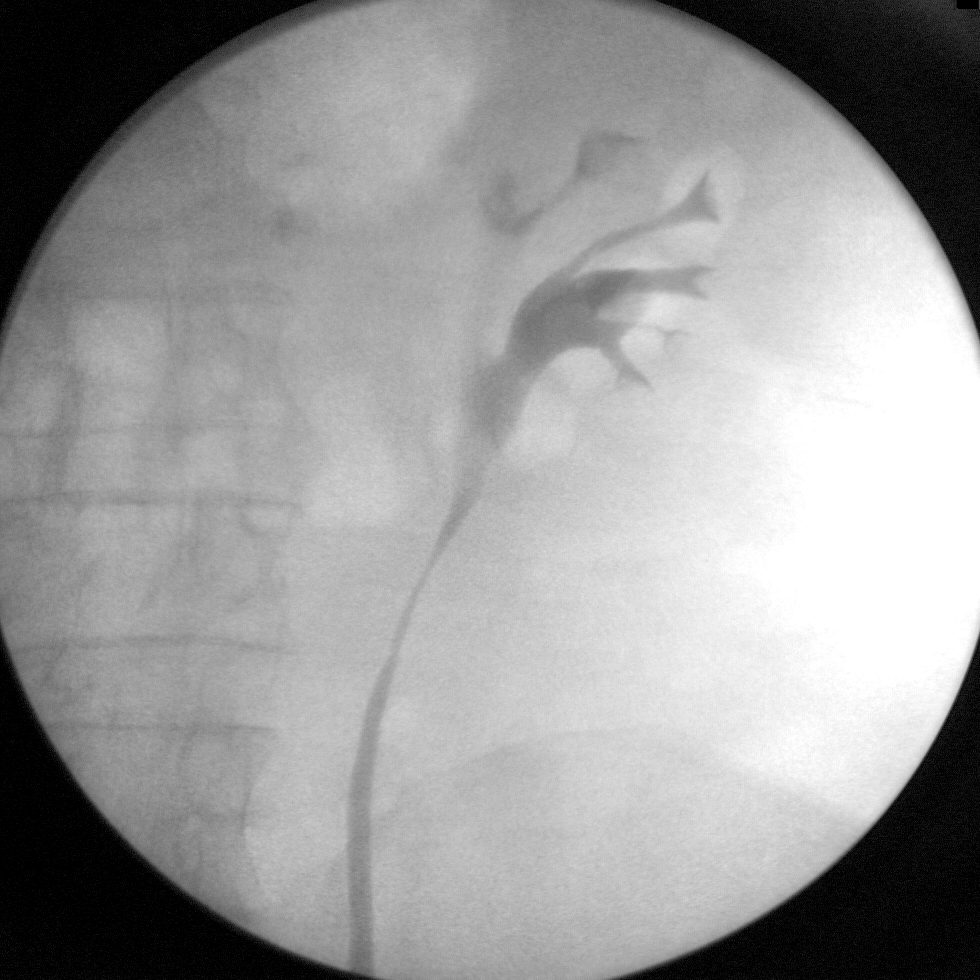
[im 4/4]
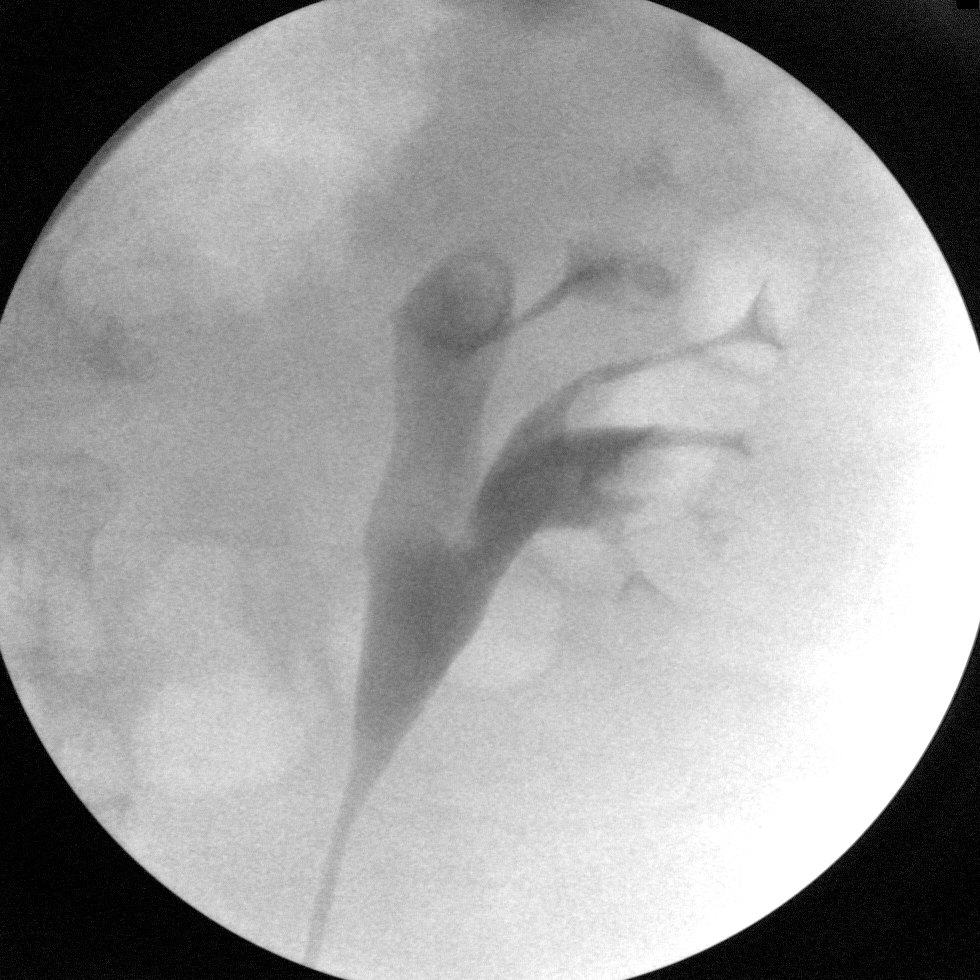

[4 of 4 positions shown; findings below may reference images not displayed]

FINDINGS: Left retrograde pyelogram demonstrates a mass obstructing the left
upper pole infundibulum. The rest of the left renal collecting
system appears normal including the entire left ureter.
IMPRESSION: Mass obstructing the upper pole infundibulum of the left kidney.

## 2015-11-26 LAB — CUP PACEART REMOTE DEVICE CHECK
Brady Statistic AS VS Percent: 0 %
Implantable Lead Implant Date: 20150903
Implantable Lead Location: 753860
Lead Channel Impedance Value: 529 Ohm
Lead Channel Impedance Value: 550 Ohm
Lead Channel Pacing Threshold Amplitude: 0.625 V
Lead Channel Pacing Threshold Pulse Width: 0.4 ms
Lead Channel Setting Pacing Amplitude: 2 V
Lead Channel Setting Sensing Sensitivity: 2.8 mV
MDC IDC LEAD IMPLANT DT: 20150903
MDC IDC LEAD LOCATION: 753859
MDC IDC MSMT BATTERY IMPEDANCE: 134 Ohm
MDC IDC MSMT BATTERY REMAINING LONGEVITY: 121 mo
MDC IDC MSMT BATTERY VOLTAGE: 2.79 V
MDC IDC MSMT LEADCHNL RA PACING THRESHOLD AMPLITUDE: 0.75 V
MDC IDC MSMT LEADCHNL RA PACING THRESHOLD PULSEWIDTH: 0.4 ms
MDC IDC MSMT LEADCHNL RA SENSING INTR AMPL: 2.8 mV
MDC IDC PG IMPLANT DT: 20150903
MDC IDC SESS DTM: 20171114122325
MDC IDC SET LEADCHNL RV PACING AMPLITUDE: 2.5 V
MDC IDC SET LEADCHNL RV PACING PULSEWIDTH: 0.4 ms
MDC IDC STAT BRADY AP VP PERCENT: 73 %
MDC IDC STAT BRADY AP VS PERCENT: 0 %
MDC IDC STAT BRADY AS VP PERCENT: 27 %

## 2016-01-06 ENCOUNTER — Other Ambulatory Visit: Payer: Self-pay

## 2016-01-31 ENCOUNTER — Ambulatory Visit (INDEPENDENT_AMBULATORY_CARE_PROVIDER_SITE_OTHER): Payer: Medicare Other | Admitting: Urology

## 2016-01-31 ENCOUNTER — Encounter: Payer: Self-pay | Admitting: Urology

## 2016-01-31 VITALS — BP 159/78 | HR 72 | Ht 69.0 in | Wt 203.8 lb

## 2016-01-31 DIAGNOSIS — R972 Elevated prostate specific antigen [PSA]: Secondary | ICD-10-CM | POA: Diagnosis not present

## 2016-01-31 DIAGNOSIS — Z8551 Personal history of malignant neoplasm of bladder: Secondary | ICD-10-CM

## 2016-01-31 LAB — URINALYSIS, COMPLETE
BILIRUBIN UA: NEGATIVE
GLUCOSE, UA: NEGATIVE
Ketones, UA: NEGATIVE
Leukocytes, UA: NEGATIVE
NITRITE UA: NEGATIVE
RBC UA: NEGATIVE
Specific Gravity, UA: 1.015 (ref 1.005–1.030)
Urobilinogen, Ur: 0.2 mg/dL (ref 0.2–1.0)
pH, UA: 5.5 (ref 5.0–7.5)

## 2016-01-31 LAB — MICROSCOPIC EXAMINATION: BACTERIA UA: NONE SEEN

## 2016-01-31 NOTE — Progress Notes (Signed)
01/31/2016 9:59 AM   Jacob [name] [birth year] [id number]  Referring provider: [name] [address] [place], [place] [address]  Chief Complaint  Patient presents with  . New Patient (Initial Visit)    History of Bladder cancer    HPI: 1 - Left Renal Pelvis Cancer - s/p left nephroureterectomy 08/2013 for large volume pTaG3 renal pelvis cancer with negative margins by Dr. [name].  Post-op Surveillance: 01/2014 - Cysto small left wall bladder tumor (about 2.5cm) / CT No bulky disesae --> TaG3 by TURBT 01/2015 - Cysto / CT NED  2 - Bladder Cancer -  2012 - TaG3 by TURBT 2013 - Induction BCG x 6 2014 - NED by surv cystos 01/2014 - TaG3 by TURBT --> Re-Induction BCG x6; 04/2014 Cysto NED; 07/2014 Cysto NED 01/2015 - Cysto / CT NED  3 - Solitary Kidney / Renal Insufficiency - s/p nephroureterectomy as per above. Cr post-op 2-2.5 range.   4 - new issue today -- elevated PSA - pt PSA was 4.5 with Dr. Rosario Jacks Jan 2017. No h/o BPH. No FH PCa. Denies prior PSA elevation.   PMH sig for CAD/Stent (no deficits at baseline, just ASA 81), CHF/Bradycardia/Pacer, Appy. He does live alone and his brother helps him with transportation. His PCP is Dr. Vedia Coffer in [place].   Today, pt is seen for the above. UA is normal without hematuria. He has no weak stream or urgency. No gross hematuria. No flank pain. He feels a "knot" LLQ. It's been there since 2015 kidney removal. Not changing in size. He has an incisional hernia on CT.   PMH: Past Medical History:  Diagnosis Date  . Arthritis    hands  . At risk for sleep apnea    STOP-BANG=  4     SENT TO PCP 06-19-5013  . BPH (benign prostatic hypertrophy)   . Coronary artery disease cardiologist-- dr Fletcher Anon  and dr gregg taylor   a. 2006 s/p PCI/Stenting x 2 to the RCA;  b. 05/03/12 Cardiac CT: Patent RCA stent, nonobs dzs;  c. 01/2013 Ex MV: nl EF, no ischemia.  Marland Kitchen GERD (gastroesophageal reflux disease)   . Glaucoma of both eyes   . H/O hiatal  hernia   . History of atrial fibrillation    a. 2006.  Marland Kitchen History of diverticulitis   . History of kidney stones   . History of renal cell carcinoma    08-22-2013  s/p  left nephrouretectomy  . Hyperlipidemia   . Hyperlipidemia   . Hypertension   . LBBB (left bundle branch block)   . Mobitz type 2 second degree AV block   . Recurrent bladder transitional cell carcinoma ([place]) monitored by dr [name]   s/p turb's  and chemo bladder instillations  . Renal insufficiency    a. Creat rose to 2.34 post-op L nephrectomy.  . S/P placement of cardiac pacemaker MEDTRONIC last pacer check 12-01-2013   08-29-2013  for symptomatic bradycardia and mobitz 2 second degree heart block  . Symptomatic bradycardia    s/p  pacemaker placement 08-29-2013    Surgical History: Past Surgical History:  Procedure Laterality Date  . APPENDECTOMY  02/1988  . CATARACT EXTRACTION W/ INTRAOCULAR LENS  IMPLANT, BILATERAL  2011  . CORONARY ANGIOPLASTY WITH STENT PLACEMENT  2006    [facility]   X2 STENTS  TO RCA  . CYSTOSCOPY W/ RETROGRADES Right 01/13/2014   Procedure: CYSTOSCOPY WITH RETROGRADE PYELOGRAM;  Surgeon: [name], MD;  Location: [facility];  Service: Urology;  Laterality: Right;  . CYSTOSCOPY W/ URETERAL STENT PLACEMENT Left 08/22/2013   Procedure: CYSTOSCOPY WITH RETROGRADE PYELOGRAM/URETERAL STENT PLACEMENT;  Surgeon: [name], MD;  [facility];  Service: Urology;  Laterality: Left;  . CYSTOSCOPY WITH BIOPSY  04/13/2011     Procedure: CYSTOSCOPY WITH BIOPSY;  Surgeon: [name], MD;  [facility];  Service: Urology;  Laterality: N/A;  Cold Cup Biopsy  . CYSTOSCOPY WITH RETROGRADE PYELOGRAM, URETEROSCOPY AND STENT PLACEMENT Left 06/19/2013   Procedure: CYSTOSCOPY WITH LEFT RETROGRADE PYELOGRAM, LEFT URETEROSCOPY, ureteral balloon dilatation, BIOPSY LEFT KIDNEY;  Surgeon: [name], MD;  [facility];  Service: Urology;   Laterality: Left;  . INGUINAL HERNIA REPAIR Left 08/22/2013   Procedure: LAPAROSCOPIC INGUINAL HERNIA;  Surgeon: [name], MD;  [facility];  Service: Urology;  Laterality: Left;  . PERMANENT PACEMAKER INSERTION N/A 08/29/2013   Procedure: PERMANENT PACEMAKER INSERTION;  Surgeon: [name], MD;  Location: [facility];  Service: Cardiovascular;  Laterality: N/A;  Medtronic dual-chamber  . ROBOT ASSITED LAPAROSCOPIC NEPHROURETERECTOMY Left 08/22/2013   Procedure: ROBOT ASSITED LAPAROSCOPIC NEPHROURETERECTOMY;  Surgeon: [name], MD;  [facility];  Service: Urology;  Laterality: Left;  . TRANSTHORACIC ECHOCARDIOGRAM  08-29-2013   mild LVH/  ef 60-65% /  grade I diastolic dysfunction/  mild MR/  mild LAE  and RAE/  moderate TR  . TRANSURETHRAL RESECTION OF BLADDER TUMOR  12-22-2010  . TRANSURETHRAL RESECTION OF BLADDER TUMOR  08/04/2011   Procedure: TRANSURETHRAL RESECTION OF BLADDER TUMOR (TURBT);  Surgeon: [name], MD;  Location: [facility];  Service: Urology;  Laterality: N/A;  . TRANSURETHRAL RESECTION OF BLADDER TUMOR N/A 01/13/2014   Procedure: TRANSURETHRAL RESECTION OF BLADDER TUMOR (TURBT);  Surgeon: [name], MD;  Location: [facility];  Service: Urology;  Laterality: N/A;    Home Medications:  Allergies as of 01/31/2016      Reactions   Penicillins Hives   BLISTERS      Medication List       Accurate as of 01/31/16  9:59 AM. Always use your most recent med list.          acetaminophen 325 MG tablet Commonly known as:  TYLENOL Take 650 mg by mouth every 4 (four) hours as needed.   albuterol 0.63 MG/3ML nebulizer solution Commonly known as:  ACCUNEB Take 1 ampule by nebulization every 6 (six) hours as needed for wheezing.   amLODipine 5 MG tablet Commonly known as:  NORVASC Take 5 mg by mouth daily.   aspirin 81 MG tablet Take 81 mg by mouth daily.   cetirizine 10 MG tablet Commonly known as:   ZYRTEC Take 10 mg by mouth every morning. ALLERGIES   Fluticasone-Salmeterol 100-50 MCG/DOSE Aepb Commonly known as:  ADVAIR Inhale 1 puff into the lungs 2 (two) times daily.   furosemide 40 MG tablet Commonly known as:  LASIX Take 40 mg by mouth daily.   isosorbide mononitrate 30 MG 24 hr tablet Commonly known as:  IMDUR Take 30 mg by mouth at bedtime.   latanoprost 0.005 % ophthalmic solution Commonly known as:  XALATAN Place 1 drop into both eyes at bedtime.   losartan 50 MG tablet Commonly known as:  COZAAR Take 1 tablet by mouth every morning.   lovastatin 20 MG tablet Commonly known as:  MEVACOR Take 20 mg by mouth at bedtime.   metoprolol tartrate 25 MG tablet Commonly  known as:  LOPRESSOR Take 12.5 mg by mouth every 12 (twelve) hours.   multivitamins ther. w/minerals Tabs tablet Take 1 tablet by mouth daily.   OMEGA 3 PO Take 1,200 mg by mouth 2 (two) times daily.   omeprazole 20 MG capsule Commonly known as:  PRILOSEC Take 20 mg by mouth every morning.   senna-docusate 8.6-50 MG tablet Commonly known as:  Senokot-S Take 1 tablet by mouth 2 (two) times daily. While taking pain meds to prevent constipation       Allergies:  Allergies  Allergen Reactions  . Penicillins Hives    BLISTERS    Family History: Family History  Problem Relation Age of Onset  . Heart attack Mother   . Heart attack Father     Social History:  reports that he quit smoking about 15 years ago. His smoking use included Cigarettes. He has a 50.00 pack-year smoking history. He has never used smokeless tobacco. He reports that he does not drink alcohol or use drugs.  ROS:                                        Physical Exam: There were no vitals taken for this visit.  Constitutional:  Alert and oriented, No acute distress. HEENT: [place] AT, moist mucus membranes.  Trachea midline, no masses. Cardiovascular: No clubbing, cyanosis, or edema. Respiratory:  Normal respiratory effort, no increased work of breathing. GI: Abdomen is soft, nontender, nondistended, no abdominal masses; incisional; hernia reducible, non-tender GU: No CVA tenderness; DRE: prostate about 50 grams, smooth with maybe some subtle nodularity right base, all landmarks preserved.  Skin: No rashes, bruises or suspicious lesions. Lymph: No cervical or inguinal adenopathy. Neurologic: Grossly intact, no focal deficits, moving all 4 extremities. Psychiatric: Normal mood and affect.  Laboratory Data: Lab Results  Component Value Date   WBC 7.1 03/27/2015   HGB 12.0 (L) 03/27/2015   HCT 36.9 (L) 03/27/2015   MCV 93.2 03/27/2015   PLT 237 03/27/2015    Lab Results  Component Value Date   CREATININE 1.82 (H) 03/27/2015    No results found for: PSA  No results found for: TESTOSTERONE  No results found for: HGBA1C  Urinalysis    Component Value Date/Time   COLORURINE YELLOW 08/28/2013 [place] 08/28/2013 1650   APPEARANCEUR Clear 07/07/2011 1544   LABSPEC 1.009 08/28/2013 1650   LABSPEC 1.035 07/07/2011 1544   PHURINE 5.5 08/28/2013 1650   GLUCOSEU NEGATIVE 08/28/2013 1650   GLUCOSEU Negative 07/07/2011 1544   HGBUR LARGE (A) 08/28/2013 1650   BILIRUBINUR NEGATIVE 08/28/2013 1650   BILIRUBINUR Negative 07/07/2011 1544   KETONESUR NEGATIVE 08/28/2013 1650   PROTEINUR 30 (A) 08/28/2013 1650   UROBILINOGEN 0.2 08/28/2013 1650   NITRITE NEGATIVE 08/28/2013 1650   LEUKOCYTESUR NEGATIVE 08/28/2013 1650   LEUKOCYTESUR Negative 07/07/2011 1544    Pertinent Imaging: CT reviewed   Assessment & Plan:    1. History of bladder cancer and upper tract urothelial ca - eval with non-con CT and f/u for cystoscopy.   2. PSA elevation - I had a long discussion with the patient on the nature of elevated PSA - benign vs malignant causes. We discussed age specific levels and that PCa can be seen on a biopsy with very low PSA levels (<=2.5). We discussed the  nature risks and benefits of continued surveillance, other lab tests, imaging  as well as prostate biopsy. We discussed the management of prostate cancer might include active surveillance or treatment depending on biopsy findings. All questions answered. Will continue surveillance, CT as above. Will need repeat PSA and DRE in 3-6 months.   - Urinalysis, Complete   No Follow-up on file.  Festus Aloe, [facility] [address] [place], [place] [address] [phone]

## 2016-02-08 ENCOUNTER — Ambulatory Visit
Admission: RE | Admit: 2016-02-08 | Discharge: 2016-02-08 | Disposition: A | Discharge status: Home / Self Care | Payer: Medicare Other | Source: Ambulatory Visit | Attending: Urology | Admitting: Urology

## 2016-02-08 DIAGNOSIS — N400 Enlarged prostate without lower urinary tract symptoms: Secondary | ICD-10-CM | POA: Diagnosis not present

## 2016-02-08 DIAGNOSIS — Z8551 Personal history of malignant neoplasm of bladder: Secondary | ICD-10-CM | POA: Insufficient documentation

## 2016-02-08 DIAGNOSIS — K429 Umbilical hernia without obstruction or gangrene: Secondary | ICD-10-CM | POA: Diagnosis not present

## 2016-02-08 DIAGNOSIS — R590 Localized enlarged lymph nodes: Secondary | ICD-10-CM | POA: Insufficient documentation

## 2016-02-08 DIAGNOSIS — R972 Elevated prostate specific antigen [PSA]: Secondary | ICD-10-CM | POA: Insufficient documentation

## 2016-02-08 DIAGNOSIS — Z87442 Personal history of urinary calculi: Secondary | ICD-10-CM | POA: Insufficient documentation

## 2016-02-09 ENCOUNTER — Telehealth: Payer: Self-pay

## 2016-02-09 NOTE — Telephone Encounter (Signed)
Festus Aloe, MD  Lestine Box, LPN        Notify patient his CT scan looked OK -- there was a lung nodule (these are not uncommon for the radiologist to find) and the radiologist recommended a CT of the chest. Please order a CT Chest without contrast next available. We also need to see him back for cystoscopy.    Spoke with pt in reference to CT results. Made pt aware nodule was found on lung and CT of chest recommended. Pt stated that CTs cost to much and he would prefer not to have another done at this time. Reinforced with pt the need of the CT chest. Pt voiced understanding stating he would like to wait at this time. Pt has cysto appt scheduled.

## 2016-02-15 ENCOUNTER — Ambulatory Visit (INDEPENDENT_AMBULATORY_CARE_PROVIDER_SITE_OTHER): Payer: Medicare Other | Admitting: *Deleted

## 2016-02-15 DIAGNOSIS — I441 Atrioventricular block, second degree: Secondary | ICD-10-CM

## 2016-02-15 NOTE — Progress Notes (Signed)
Remote pacemaker transmission.   

## 2016-02-16 ENCOUNTER — Other Ambulatory Visit: Payer: Self-pay

## 2016-02-16 ENCOUNTER — Encounter: Payer: Self-pay | Admitting: Cardiology

## 2016-02-17 LAB — CUP PACEART REMOTE DEVICE CHECK
Battery Impedance: 158 Ohm
Battery Remaining Longevity: 115 mo
Brady Statistic AP VP Percent: 72 %
Brady Statistic AS VP Percent: 28 %
Brady Statistic AS VS Percent: 0 %
Implantable Lead Implant Date: 20150903
Implantable Lead Location: 753860
Implantable Lead Model: 5076
Implantable Lead Model: 5076
Implantable Pulse Generator Implant Date: 20150903
Lead Channel Impedance Value: 515 Ohm
Lead Channel Impedance Value: 527 Ohm
Lead Channel Pacing Threshold Amplitude: 0.75 V
Lead Channel Pacing Threshold Amplitude: 0.75 V
Lead Channel Sensing Intrinsic Amplitude: 2.8 mV
Lead Channel Setting Pacing Amplitude: 2 V
MDC IDC LEAD IMPLANT DT: 20150903
MDC IDC LEAD LOCATION: 753859
MDC IDC MSMT BATTERY VOLTAGE: 2.79 V
MDC IDC MSMT LEADCHNL RA PACING THRESHOLD PULSEWIDTH: 0.4 ms
MDC IDC MSMT LEADCHNL RV PACING THRESHOLD PULSEWIDTH: 0.4 ms
MDC IDC SESS DTM: 20180213130407
MDC IDC SET LEADCHNL RV PACING AMPLITUDE: 2.5 V
MDC IDC SET LEADCHNL RV PACING PULSEWIDTH: 0.4 ms
MDC IDC SET LEADCHNL RV SENSING SENSITIVITY: 2.8 mV
MDC IDC STAT BRADY AP VS PERCENT: 0 %

## 2016-02-28 ENCOUNTER — Ambulatory Visit (INDEPENDENT_AMBULATORY_CARE_PROVIDER_SITE_OTHER): Payer: Medicare Other | Admitting: Urology

## 2016-02-28 ENCOUNTER — Encounter: Payer: Self-pay | Admitting: Urology

## 2016-02-28 VITALS — BP 133/64 | HR 65 | Ht 69.0 in | Wt 200.5 lb

## 2016-02-28 DIAGNOSIS — R972 Elevated prostate specific antigen [PSA]: Secondary | ICD-10-CM | POA: Diagnosis not present

## 2016-02-28 DIAGNOSIS — R911 Solitary pulmonary nodule: Secondary | ICD-10-CM

## 2016-02-28 DIAGNOSIS — C679 Malignant neoplasm of bladder, unspecified: Secondary | ICD-10-CM | POA: Diagnosis not present

## 2016-02-28 LAB — URINALYSIS, COMPLETE
Bilirubin, UA: NEGATIVE
GLUCOSE, UA: NEGATIVE
KETONES UA: NEGATIVE
Leukocytes, UA: NEGATIVE
NITRITE UA: NEGATIVE
RBC, UA: NEGATIVE
SPEC GRAV UA: 1.01 (ref 1.005–1.030)
UUROB: 0.2 mg/dL (ref 0.2–1.0)
pH, UA: 5.5 (ref 5.0–7.5)

## 2016-02-28 LAB — MICROSCOPIC EXAMINATION: Bacteria, UA: NONE SEEN

## 2016-02-28 MED ORDER — LIDOCAINE HCL 2 % EX GEL
1.0000 "application " | Freq: Once | CUTANEOUS | Status: AC
  Administered 2016-02-28: 1 via URETHRAL

## 2016-02-28 MED ORDER — CIPROFLOXACIN HCL 500 MG PO TABS
500.0000 mg | ORAL_TABLET | Freq: Once | ORAL | Status: AC
  Administered 2016-02-28: 500 mg via ORAL

## 2016-02-28 NOTE — Progress Notes (Signed)
   02/28/16  CC:  Chief Complaint  Patient presents with  . Cysto    bladder carcinoma     HPI:  HPI: 1 - Left Renal Pelvis Cancer - s/p left nephroureterectomy 08/2013 for large volume pTaG3 renal pelvis cancer with negative margins by Dr. [name]. He has an incisional/periumbilical hernia.  Post-op Surveillance: 01/2014 - Cysto small left wall bladder tumor (about 2.5cm) / CT No bulky disesae --> TaG3 by TURBT 01/2015 - Cysto / CT NED 02/2016 CT negative apart from 7 mm pulm nodule   2 - Bladder Cancer -  2012 - TaG3 by TURBT 2013 - Induction BCG x 6 2014 - NED by surv cystos 01/2014 - TaG3 by TURBT --> Re-Induction BCG x6; 04/2014 Cysto NED; 07/2014 Cysto NED 01/2015 - Cysto / CT NED 02/2016 CT 02/2016 CT negative apart from 7 mm pulm nodule   3- elevated PSA - pt PSA was 4.5 with Dr. Rosario Jacks Jan 2018. No h/o BPH. No FH PCa. Denies prior PSA elevation. DRE Jan 2018 showed a 50 + gram prostate with some subtle induration right base. Prostate measured 67 grams on Jan 2018 CT.   4 - Solitary Kidney / Renal Insufficiency - s/p nephroureterectomy as per above. Cr post-op 2-2.5 range.   PMH sig for CAD/Stent (no deficits at baseline, just ASA 81), CHF/Bradycardia/Pacer, Appy. He does live alone and his brother helps him with transportation. His PCP is Dr. Vedia Coffer in [place].   Patient returns today for cystoscopy, but also to discuss CT results, set up chest CT and set up PSA follow-up. I reviewed the CT images.    There were no vitals taken for this visit. NED. A&Ox3.   No respiratory distress   Abd soft, NT, ND Normal phallus with bilateral descended testicles  Cystoscopy Procedure Note  Patient identification was confirmed, informed consent was obtained, and patient was prepped using Betadine solution.  Lidocaine jelly was administered per urethral meatus.    Preoperative abx where received prior to procedure.     Pre-Procedure: - Inspection reveals a normal caliber ureteral  meatus.  Procedure: The flexible cystoscope was introduced without difficulty - No urethral strictures/lesions are present. There is a well-healed false passage, small tic in the inferior bulb., -  prostate elongated with lateral lobe hypertrophy.  - elevated bladder neck - Right ureteral orifices identified - Bladder mucosa  reveals no ulcers, tumors, or lesions - faint erythema at dome/anterior  - No bladder stones - mild trabeculation  Retroflexion shows normal bladder neck.    Post-Procedure: - Patient tolerated the procedure well  Assessment/ Plan:  1) h/o upper tract urothelial/bladder ca - cysto was overall benign. Very faint erythema at dome, nothing conspicuous. Cytology was sent. Discussed CT pulm nodule and importance of CT (pt c/o CT cost). Discussed need to see rest of lungs given h/o cancer and CT with 7 mm pulmonary nodule. Chest CT ordered.   2) PSA elevation/induration right prostate - check PSA in 3 months with repeat DRE.  PSAD is normal.

## 2016-03-02 ENCOUNTER — Other Ambulatory Visit: Payer: Self-pay | Admitting: Urology

## 2016-03-03 ENCOUNTER — Telehealth: Payer: Self-pay

## 2016-03-03 DIAGNOSIS — R972 Elevated prostate specific antigen [PSA]: Secondary | ICD-10-CM

## 2016-03-03 NOTE — Telephone Encounter (Signed)
Festus Aloe, MD  Lestine Box, LPN        Notify patient his urine cytology was negative -- they didn't see any cancer cells in the urine. We'll see him in 3 months with a PSA prior. Also, I ordered a Chest CT to be done next available.    Spoke with pt in reference to urine cytology results, PSA and CT. Pt voiced understanding of all.

## 2016-04-19 ENCOUNTER — Ambulatory Visit
Admission: RE | Admit: 2016-04-19 | Discharge: 2016-04-19 | Disposition: A | Discharge status: Home / Self Care | Payer: Medicare Other | Source: Ambulatory Visit | Attending: Urology | Admitting: Urology

## 2016-04-19 DIAGNOSIS — R911 Solitary pulmonary nodule: Secondary | ICD-10-CM | POA: Diagnosis present

## 2016-04-19 DIAGNOSIS — C679 Malignant neoplasm of bladder, unspecified: Secondary | ICD-10-CM | POA: Diagnosis not present

## 2016-04-21 ENCOUNTER — Telehealth: Payer: Self-pay

## 2016-04-21 DIAGNOSIS — R911 Solitary pulmonary nodule: Secondary | ICD-10-CM

## 2016-04-21 NOTE — Telephone Encounter (Signed)
Jacob Aloe, MD  Lestine Box, LPN        Notify the patient there is still a very small nodule (19m) in his lung. I don't know much about the lungs as a urologist and the radiologist did not seem to concerned, but I want to refer him to one of the oncologists at the cancer center to look over the CT's.   Refer to oncology at cancer center for lung nodule. Thanks.    LMOM

## 2016-04-21 NOTE — Telephone Encounter (Signed)
Spoke with pt in reference lung nodule and needing to see oncology. Pt voiced understanding. Referral placed.

## 2016-04-24 ENCOUNTER — Other Ambulatory Visit: Payer: Self-pay | Admitting: *Deleted

## 2016-05-07 DIAGNOSIS — R911 Solitary pulmonary nodule: Secondary | ICD-10-CM

## 2016-05-07 HISTORY — DX: Solitary pulmonary nodule: R91.1

## 2016-05-07 NOTE — Progress Notes (Signed)
[place]  [phone]) [phone] Fax:(336) [phone]  ID: [name] OB: [birth year]  MR#: [id number]  JSE#:[id number]  Patient Care Team: [name] as PCP - General (Internal Medicine)  CHIEF COMPLAINT: Nodule of the left lung.  INTERVAL HISTORY: Patient is an 83 year old male with a history of bladder cancer who was noted to have an enlarging pulmonary nodule which was seen on routine imaging for his known invasive bladder cancer. He currently feels well and is asymptomatic. He has no neurological point. He denies any recent fevers or illnesses. He has a good appetite and denies weight loss. He denies any chest pain, shortness of breath, cough, or hemoptysis. He has no nausea, vomiting, constipation, or diarrhea. He has no new urinary complaints. Patient feels at his baseline and offers no specific complaints today.  REVIEW OF SYSTEMS:   Review of Systems  Constitutional: Negative.  Negative for fever, malaise/fatigue and weight loss.  Respiratory: Negative.  Negative for cough, hemoptysis and shortness of breath.   Cardiovascular: Negative.  Negative for chest pain and leg swelling.  Gastrointestinal: Negative.  Negative for abdominal pain.  Genitourinary: Negative.   Musculoskeletal: Negative.   Skin: Negative.  Negative for rash.  Neurological: Negative.  Negative for sensory change and weakness.  Psychiatric/Behavioral: Negative.  The patient is not nervous/anxious.     As per HPI. Otherwise, a complete review of systems is negative.  PAST MEDICAL HISTORY: Past Medical History:  Diagnosis Date  . Arthritis    hands  . At risk for sleep apnea    STOP-BANG=  4     SENT TO PCP 06-19-5013  . BPH (benign prostatic hypertrophy)   . Coronary artery disease cardiologist-- dr Fletcher Anon  and dr gregg taylor   a. 2006 s/p PCI/Stenting x 2 to the RCA;  b. 05/03/12 Cardiac CT: Patent RCA stent, nonobs dzs;  c. 01/2013 Ex MV: nl EF, no ischemia.  Marland Kitchen GERD  (gastroesophageal reflux disease)   . Glaucoma of both eyes   . H/O hiatal hernia   . History of atrial fibrillation    a. 2006.  Marland Kitchen History of diverticulitis   . History of kidney stones   . History of renal cell carcinoma    08-22-2013  s/p  left nephrouretectomy  . Hyperlipidemia   . Hyperlipidemia   . Hypertension   . LBBB (left bundle branch block)   . Mobitz type 2 second degree AV block   . Recurrent bladder transitional cell carcinoma ([place]) monitored by dr [name]   s/p turb's  and chemo bladder instillations  . Renal insufficiency    a. Creat rose to 2.34 post-op L nephrectomy.  . S/P placement of cardiac pacemaker MEDTRONIC last pacer check 12-01-2013   08-29-2013  for symptomatic bradycardia and mobitz 2 second degree heart block  . Symptomatic bradycardia    s/p  pacemaker placement 08-29-2013    PAST SURGICAL HISTORY: Past Surgical History:  Procedure Laterality Date  . APPENDECTOMY  02/1988  . CATARACT EXTRACTION W/ INTRAOCULAR LENS  IMPLANT, BILATERAL  2011  . CORONARY ANGIOPLASTY WITH STENT PLACEMENT  2006    [facility]   X2 STENTS  TO RCA  . CYSTOSCOPY W/ RETROGRADES Right 01/13/2014   Procedure: CYSTOSCOPY WITH RETROGRADE PYELOGRAM;  Surgeon: [name], MD;  Location: [facility];  Service: Urology;  Laterality: Right;  . CYSTOSCOPY W/ URETERAL STENT PLACEMENT Left 08/22/2013   Procedure: CYSTOSCOPY WITH RETROGRADE PYELOGRAM/URETERAL STENT PLACEMENT;  Surgeon: [name], MD;  Location[facility];  Service: Urology;  Laterality: Left;  . CYSTOSCOPY WITH BIOPSY  04/13/2011     Procedure: CYSTOSCOPY WITH BIOPSY;  Surgeon: [name], MD;  Location: [facility];  Service: Urology;  Laterality: N/A;  Cold Cup Biopsy  . CYSTOSCOPY WITH RETROGRADE PYELOGRAM, URETEROSCOPY AND STENT PLACEMENT Left 06/19/2013   Procedure: CYSTOSCOPY WITH LEFT RETROGRADE PYELOGRAM, LEFT URETEROSCOPY, ureteral balloon dilatation, BIOPSY LEFT KIDNEY;  Surgeon: [name], MD;  Location: [facility];  Service: Urology;  Laterality: Left;  . INGUINAL HERNIA REPAIR Left 08/22/2013   Procedure: LAPAROSCOPIC INGUINAL HERNIA;  Surgeon: [name], MD;  [facility];  Service: Urology;  Laterality: Left;  . PERMANENT PACEMAKER INSERTION N/A 08/29/2013   Procedure: PERMANENT PACEMAKER INSERTION;  Surgeon: [name], MD;  Location: [facility] CATH [facility];  Service: Cardiovascular;  Laterality: N/A;  Medtronic dual-chamber  . ROBOT ASSITED LAPAROSCOPIC NEPHROURETERECTOMY Left 08/22/2013   Procedure: ROBOT ASSITED LAPAROSCOPIC NEPHROURETERECTOMY;  Surgeon: [name], MD;  [facility];  Service: Urology;  Laterality: Left;  . TRANSTHORACIC ECHOCARDIOGRAM  08-29-2013   mild LVH/  ef 60-65% /  grade I diastolic dysfunction/  mild MR/  mild LAE  and RAE/  moderate TR  . TRANSURETHRAL RESECTION OF BLADDER TUMOR  12-22-2010  . TRANSURETHRAL RESECTION OF BLADDER TUMOR  08/04/2011   Procedure: TRANSURETHRAL RESECTION OF BLADDER TUMOR (TURBT);  Surgeon: [name], MD;  Location: [facility];  Service: Urology;  Laterality: N/A;  . TRANSURETHRAL RESECTION OF BLADDER TUMOR N/A 01/13/2014   Procedure: TRANSURETHRAL RESECTION OF BLADDER TUMOR (TURBT);  Surgeon: [name], MD;  Location: [facility];  Service: Urology;  Laterality: N/A;    FAMILY HISTORY: Family History  Problem Relation Age of Onset  . Heart attack Mother   . Heart attack Father   . Prostate cancer Neg Hx   . Bladder Cancer Neg Hx   . Kidney cancer Neg Hx     ADVANCED DIRECTIVES (Y/N):  N  HEALTH MAINTENANCE: Social History  Substance Use Topics  . Smoking status: Former Smoker    Packs/day: 1.00    Years: 50.00    Types: Cigarettes    Quit date: 11/02/2000  . Smokeless tobacco: Never Used  . Alcohol use No     Colonoscopy:  PAP:  Bone density:  Lipid panel:  Allergies  Allergen Reactions  . Penicillins Hives     BLISTERS    Current Outpatient Prescriptions  Medication Sig Dispense Refill  . acetaminophen (TYLENOL) 325 MG tablet Take 650 mg by mouth every 4 (four) hours as needed.    Marland Kitchen aspirin 81 MG tablet Take 81 mg by mouth daily.    . furosemide (LASIX) 40 MG tablet Take 40 mg by mouth daily.    . isosorbide mononitrate (IMDUR) 30 MG 24 hr tablet Take 30 mg by mouth at bedtime.     Marland Kitchen latanoprost (XALATAN) 0.005 % ophthalmic solution Place 1 drop into both eyes at bedtime.     Marland Kitchen losartan (COZAAR) 50 MG tablet Take 1 tablet by mouth every morning.     . lovastatin (MEVACOR) 20 MG tablet Take 20 mg by mouth at bedtime.     . metoprolol tartrate (LOPRESSOR) 25 MG tablet Take 12.5 mg by mouth every 12 (twelve) hours.    . Multiple Vitamins-Minerals (MULTIVITAMINS THER. W/MINERALS) TABS Take 1 tablet by mouth daily.     . ranitidine (ZANTAC) 150 MG tablet Take 150 mg  by mouth at bedtime.    . senna-docusate (SENOKOT-S) 8.6-50 MG per tablet Take 1 tablet by mouth 2 (two) times daily. While taking pain meds to prevent constipation 30 tablet 0   No current facility-administered medications for this visit.     OBJECTIVE: Vitals:   05/09/16 1243  BP: (!) 162/84  Pulse: 94     Body mass index is 29.25 kg/m.    ECOG FS:0 - Asymptomatic  General: Well-developed, well-nourished, no acute distress. Eyes: Pink conjunctiva, anicteric sclera. HEENT: Normocephalic, moist mucous membranes, clear oropharnyx. Lungs: Clear to auscultation bilaterally. Heart: Regular rate and rhythm. No rubs, murmurs, or gallops. Abdomen: Soft, nontender, nondistended. No organomegaly noted, normoactive bowel sounds. Musculoskeletal: No edema, cyanosis, or clubbing. Neuro: Alert, answering all questions appropriately. Cranial nerves grossly intact. Skin: No rashes or petechiae noted. Psych: Normal affect. Lymphatics: No cervical, calvicular, axillary or inguinal LAD.   LAB RESULTS:  Lab Results  Component Value Date    NA 141 03/27/2015   K 4.4 03/27/2015   CL 106 03/27/2015   CO2 31 03/27/2015   GLUCOSE 146 (H) 03/27/2015   BUN 30 (H) 03/27/2015   CREATININE 1.82 (H) 03/27/2015   CALCIUM 8.4 (L) 03/27/2015   PROT 7.6 07/07/2011   ALBUMIN 3.6 07/07/2011   AST 23 07/07/2011   ALT 23 07/07/2011   ALKPHOS 69 07/07/2011   BILITOT 0.5 07/07/2011   GFRNONAA 32 (L) 03/27/2015   GFRAA 38 (L) 03/27/2015    Lab Results  Component Value Date   WBC 7.1 03/27/2015   NEUTROABS 5.1 03/27/2015   HGB 12.0 (L) 03/27/2015   HCT 36.9 (L) 03/27/2015   MCV 93.2 03/27/2015   PLT 237 03/27/2015     STUDIES: Ct Chest Wo Contrast  Result Date: 04/19/2016 CLINICAL DATA:  Lung nodule. EXAM: CT CHEST WITHOUT CONTRAST TECHNIQUE: Multidetector CT imaging of the chest was performed following the standard protocol without IV contrast. COMPARISON:  Abdomen and pelvis CT from 02/08/2016, 01/14/2015 and 01/06/2014 FINDINGS: Cardiovascular: The heart size is normal. No pericardial effusion. Coronary artery calcification is noted. Permanent pacemaker again noted. Atherosclerotic calcification is noted in the wall of the thoracic aorta. Mediastinum/Nodes: No mediastinal lymphadenopathy. No evidence for gross hilar lymphadenopathy although assessment is limited by the lack of intravenous contrast on today's study. The esophagus has normal imaging features. There is no axillary lymphadenopathy. Lungs/Pleura: Centrilobular and paraseptal emphysema. 6 mm right middle lobe nodule (image 101 series 3) not included on prior abdomen CT, was seen on 01/06/2014 study and is unchanged, consistent with benign process. 2-3 mm posterior right lower lobe nodule (image 125) also unchanged since 01/06/2014. Tiny posterior left lower lobe nodule seen image 115 is stable. 7 mm subpleural lingular nodule has clearly progressed since 01/14/2015 and close assessment indicates that this area of the lingula was included on the 01/06/2014 exam and there is no  nodule at this location on that study. Upper Abdomen: Medial segment left liver lesion again identified, incompletely characterized on today's study. Musculoskeletal: Bone windows reveal no worrisome lytic or sclerotic osseous lesions. IMPRESSION: 1. 7 mm subpleural lingula nodule has progressed since 01/14/2015 and is new since 01/06/2014. Given the continued growth, primary bronchogenic neoplasm is a distinct concern. This lesion falls below the size threshold for reliable resolution on PET imaging, especially this close to the hemidiaphragm. Close continued attention recommended. Electronically Signed   By: Misty Stanley M.D.   On: 04/19/2016 11:57    ASSESSMENT: Nodule of the left lung.  PLAN:    1. Nodule of the left lung: CT scan results reviewed independently and reported as above with a 7 mm subpleural lingular nodule progressed since January 2017. Case was previously discussed at cancer conference and it was decided upon a given the slow growth of this tumor as well as the patient's advanced age, observation would be the best strategy. After lengthy discussion with the patient, he agrees and will return to clinic in 6 months with repeat imaging and further evaluation. If nodule continues to progress, will consider biopsy or possible surgical removal at that time.  Approximately 60 minutes was spent in discussion of which greater than 50% was consultation.  Patient expressed understanding and was in agreement with this plan. He also understands that He can call clinic at any time with any questions, concerns, or complaints.   Cancer Staging No matching staging information was found for the patient.  Lloyd Huger, MD   05/15/2016 11:10 AM

## 2016-05-09 ENCOUNTER — Encounter: Payer: Self-pay | Admitting: Oncology

## 2016-05-09 ENCOUNTER — Encounter: Payer: Self-pay | Admitting: *Deleted

## 2016-05-09 ENCOUNTER — Inpatient Hospital Stay: Payer: Medicare Other | Attending: Oncology | Admitting: Oncology

## 2016-05-09 DIAGNOSIS — K449 Diaphragmatic hernia without obstruction or gangrene: Secondary | ICD-10-CM | POA: Diagnosis not present

## 2016-05-09 DIAGNOSIS — Z950 Presence of cardiac pacemaker: Secondary | ICD-10-CM | POA: Insufficient documentation

## 2016-05-09 DIAGNOSIS — Z87442 Personal history of urinary calculi: Secondary | ICD-10-CM | POA: Diagnosis not present

## 2016-05-09 DIAGNOSIS — K219 Gastro-esophageal reflux disease without esophagitis: Secondary | ICD-10-CM | POA: Insufficient documentation

## 2016-05-09 DIAGNOSIS — N400 Enlarged prostate without lower urinary tract symptoms: Secondary | ICD-10-CM | POA: Insufficient documentation

## 2016-05-09 DIAGNOSIS — I441 Atrioventricular block, second degree: Secondary | ICD-10-CM | POA: Insufficient documentation

## 2016-05-09 DIAGNOSIS — H409 Unspecified glaucoma: Secondary | ICD-10-CM | POA: Diagnosis not present

## 2016-05-09 DIAGNOSIS — Z955 Presence of coronary angioplasty implant and graft: Secondary | ICD-10-CM | POA: Diagnosis not present

## 2016-05-09 DIAGNOSIS — I4891 Unspecified atrial fibrillation: Secondary | ICD-10-CM | POA: Diagnosis not present

## 2016-05-09 DIAGNOSIS — Z7982 Long term (current) use of aspirin: Secondary | ICD-10-CM | POA: Insufficient documentation

## 2016-05-09 DIAGNOSIS — Z85528 Personal history of other malignant neoplasm of kidney: Secondary | ICD-10-CM | POA: Insufficient documentation

## 2016-05-09 DIAGNOSIS — R911 Solitary pulmonary nodule: Secondary | ICD-10-CM | POA: Insufficient documentation

## 2016-05-09 DIAGNOSIS — I10 Essential (primary) hypertension: Secondary | ICD-10-CM | POA: Diagnosis not present

## 2016-05-09 DIAGNOSIS — E785 Hyperlipidemia, unspecified: Secondary | ICD-10-CM | POA: Insufficient documentation

## 2016-05-09 DIAGNOSIS — I2510 Atherosclerotic heart disease of native coronary artery without angina pectoris: Secondary | ICD-10-CM | POA: Insufficient documentation

## 2016-05-09 DIAGNOSIS — Z87891 Personal history of nicotine dependence: Secondary | ICD-10-CM | POA: Insufficient documentation

## 2016-05-09 DIAGNOSIS — Z8551 Personal history of malignant neoplasm of bladder: Secondary | ICD-10-CM | POA: Insufficient documentation

## 2016-05-09 DIAGNOSIS — Z905 Acquired absence of kidney: Secondary | ICD-10-CM | POA: Diagnosis not present

## 2016-05-09 NOTE — Progress Notes (Signed)
  Oncology Nurse Navigator Documentation  Navigator Location: [facility] (05/09/16 1200) Referral date to RadOnc/MedOnc: 04/21/16 (05/09/16 1200) )Navigator Encounter Type: Initial MedOnc (05/09/16 1200)   Abnormal Finding Date: 04/19/16 (05/09/16 1200)                   Treatment Phase: Abnormal Scans (05/09/16 1200) Barriers/Navigation Needs: No barriers at this time;No Questions;No Needs (05/09/16 1200)                Acuity: Level 2 (05/09/16 1200)   Acuity Level 2: Ongoing guidance and education throughout treatment as needed (05/09/16 1200)    Met with patient during initial consult with Dr. [name] for lung nodule. All questions were addressed during visit. Pt had no further concerns or barriers. Informed pt that will follow results from CT scan in 6 months. Instructed to call with questions.  Time Spent with Patient: 30 (05/09/16 1200)

## 2016-05-16 ENCOUNTER — Encounter: Payer: Medicare Other | Admitting: *Deleted

## 2016-05-16 ENCOUNTER — Telehealth: Payer: Self-pay | Admitting: Internal Medicine

## 2016-05-16 NOTE — Telephone Encounter (Signed)
New message    Pt is calling about his home remote pacer check today. He tried to do it and he states he got a low battery symbol pop up.

## 2016-05-16 NOTE — Telephone Encounter (Signed)
Attempted to help pt send remote transmission. After on unsuccessful attempt instructed pt to call tech support.

## 2016-05-16 NOTE — Telephone Encounter (Signed)
LMOVM for pt to return call 

## 2016-05-18 ENCOUNTER — Encounter: Payer: Self-pay | Admitting: Cardiology

## 2016-05-18 NOTE — Progress Notes (Signed)
Letter  

## 2016-05-22 ENCOUNTER — Other Ambulatory Visit: Payer: Medicare Other

## 2016-05-22 DIAGNOSIS — R972 Elevated prostate specific antigen [PSA]: Secondary | ICD-10-CM

## 2016-05-23 ENCOUNTER — Ambulatory Visit (INDEPENDENT_AMBULATORY_CARE_PROVIDER_SITE_OTHER): Payer: Medicare Other | Admitting: *Deleted

## 2016-05-23 ENCOUNTER — Telehealth: Payer: Self-pay

## 2016-05-23 DIAGNOSIS — I441 Atrioventricular block, second degree: Secondary | ICD-10-CM

## 2016-05-23 LAB — FPSA% REFLEX
% FREE PSA: 19.4 %
PSA, FREE: 0.93 ng/mL

## 2016-05-23 LAB — PSA TOTAL (REFLEX TO FREE): PROSTATE SPECIFIC AG, SERUM: 4.8 ng/mL — AB (ref 0.0–4.0)

## 2016-05-23 NOTE — Telephone Encounter (Signed)
Spoke with pt in reference to PSA results. Pt voiced understanding.  

## 2016-05-23 NOTE — Progress Notes (Signed)
Remote pacemaker transmission.   

## 2016-05-23 NOTE — Telephone Encounter (Signed)
Festus Aloe, MD  Lestine Box, LPN        Notify patient - PSA remains elevated but overall stable. F/u as planned. Give result.    LMOM

## 2016-05-24 ENCOUNTER — Encounter: Payer: Self-pay | Admitting: Cardiology

## 2016-05-24 ENCOUNTER — Ambulatory Visit (INDEPENDENT_AMBULATORY_CARE_PROVIDER_SITE_OTHER): Payer: Medicare Other | Admitting: Urology

## 2016-05-24 ENCOUNTER — Encounter: Payer: Self-pay | Admitting: Urology

## 2016-05-24 VITALS — BP 143/66 | HR 83 | Ht 69.0 in | Wt 199.1 lb

## 2016-05-24 DIAGNOSIS — R972 Elevated prostate specific antigen [PSA]: Secondary | ICD-10-CM | POA: Diagnosis not present

## 2016-05-24 NOTE — Progress Notes (Signed)
Letter  

## 2016-05-24 NOTE — Progress Notes (Signed)
05/24/2016 10:56 AM   Jacob [name] [birth year] [id number]  Referring provider: [name] [address] [place],  [address]  Chief Complaint  Patient presents with  . Follow-up    results on CT, PSA    HPI:   1 - Left Renal Pelvis Cancer - s/p left nephroureterectomy 08/2013 for large volume pTaG3 renal pelvis cancer with negative margins by Dr. [name]. He has an incisional/periumbilical hernia.  Post-op Surveillance: 01/2014 - Cysto small left wall bladder tumor (about 2.5cm) / CT No bulky disesae --> TaG3 by TURBT 01/2015 - Cysto / CT NED 02/2016 CT negative apart from 7 mm pulm nodule  05/2016 following with Dr. [name] for pulm nodule    2 - Bladder Cancer -  2012 - TaG3 by TURBT 2013 - Induction BCG x 6 2014 - NED by surv cystos 01/2014 - TaG3 by TURBT --> Re-Induction BCG x6; 04/2014 Cysto NED; 07/2014 Cysto NED 01/2015 - Cysto / CTNED 02/2016 CT 02/2016 CT negative apart from 7 mm pulm nodule; normal cystoscopy   3- elevated PSA - pt PSA was 4.5 with Dr. Rosario Jacks Jan 2018. No h/o BPH. No FH PCa. Denies prior PSA elevation. DRE Jan 2018 showed a 50 + gram prostate with some subtle induration right base, but felt more benign today. Prostate measured 67 grams on Jan 2018 CT.   4 - Renal Insufficiency - solitary kidney s/p nephroureterectomy as per above. Cr post-op 2-2.5 range.   PMH sig for CAD/Stent (no deficits at baseline, just ASA 81), CHF/Bradycardia/Pacer, Appy. He does live alone and his brother helps him with transportation. His PCP is Dr. Vedia Coffer in [place].   Pt returns today. I reviewed Dr. Gary Fleet notes. They are doing surveillance on the pulm nodule. PSA was repeated at noted to be up slightly to 4.8 with 19% free. He's had no gross hematuria.    PMH: Past Medical History:  Diagnosis Date  . Arthritis    hands  . At risk for sleep apnea    STOP-BANG=  4     SENT TO PCP 06-19-5013  . BPH (benign prostatic hypertrophy)   . Coronary artery  disease cardiologist-- dr Fletcher Anon  and dr gregg taylor   a. 2006 s/p PCI/Stenting x 2 to the RCA;  b. 05/03/12 Cardiac CT: Patent RCA stent, nonobs dzs;  c. 01/2013 Ex MV: nl EF, no ischemia.  Marland Kitchen GERD (gastroesophageal reflux disease)   . Glaucoma of both eyes   . H/O hiatal hernia   . History of atrial fibrillation    a. 2006.  Marland Kitchen History of diverticulitis   . History of kidney stones   . History of renal cell carcinoma    08-22-2013  s/p  left nephrouretectomy  . Hyperlipidemia   . Hyperlipidemia   . Hypertension   . LBBB (left bundle branch block)   . Mobitz type 2 second degree AV block   . Recurrent bladder transitional cell carcinoma ([place]) monitored by dr [name]   s/p turb's  and chemo bladder instillations  . Renal insufficiency    a. Creat rose to 2.34 post-op L nephrectomy.  . S/P placement of cardiac pacemaker MEDTRONIC last pacer check 12-01-2013   08-29-2013  for symptomatic bradycardia and mobitz 2 second degree heart block  . Symptomatic bradycardia    s/p  pacemaker placement 08-29-2013    Surgical History: Past Surgical History:  Procedure Laterality Date  . APPENDECTOMY  02/1988  . CATARACT EXTRACTION W/ INTRAOCULAR LENS  IMPLANT,  BILATERAL  2011  . CORONARY ANGIOPLASTY WITH STENT PLACEMENT  2006    [facility]   X2 STENTS  TO RCA  . CYSTOSCOPY W/ RETROGRADES Right 01/13/2014   Procedure: CYSTOSCOPY WITH RETROGRADE PYELOGRAM;  Surgeon: [name], MD;  Location: [facility];  Service: Urology;  Laterality: Right;  . CYSTOSCOPY W/ URETERAL STENT PLACEMENT Left 08/22/2013   Procedure: CYSTOSCOPY WITH RETROGRADE PYELOGRAM/URETERAL STENT PLACEMENT;  Surgeon: [name], MD;  [facility];  Service: Urology;  Laterality: Left;  . CYSTOSCOPY WITH BIOPSY  04/13/2011     Procedure: CYSTOSCOPY WITH BIOPSY;  Surgeon: [name], MD;  Location[facility];  Service: Urology;  Laterality: N/A;  Cold Cup Biopsy  . CYSTOSCOPY WITH RETROGRADE PYELOGRAM,  URETEROSCOPY AND STENT PLACEMENT Left 06/19/2013   Procedure: CYSTOSCOPY WITH LEFT RETROGRADE PYELOGRAM, LEFT URETEROSCOPY, ureteral balloon dilatation, BIOPSY LEFT KIDNEY;  Surgeon: [name], MD;  Location: [facility];  Service: Urology;  Laterality: Left;  . INGUINAL HERNIA REPAIR Left 08/22/2013   Procedure: LAPAROSCOPIC INGUINAL HERNIA;  Surgeon: [name], MD;  [facility];  Service: Urology;  Laterality: Left;  . PERMANENT PACEMAKER INSERTION N/A 08/29/2013   Procedure: PERMANENT PACEMAKER INSERTION;  Surgeon: [name], MD;  Location: [facility];  Service: Cardiovascular;  Laterality: N/A;  Medtronic dual-chamber  . ROBOT ASSITED LAPAROSCOPIC NEPHROURETERECTOMY Left 08/22/2013   Procedure: ROBOT ASSITED LAPAROSCOPIC NEPHROURETERECTOMY;  Surgeon: [name], MD;  [facility];  Service: Urology;  Laterality: Left;  . TRANSTHORACIC ECHOCARDIOGRAM  08-29-2013   mild LVH/  ef 60-65% /  grade I diastolic dysfunction/  mild MR/  mild LAE  and RAE/  moderate TR  . TRANSURETHRAL RESECTION OF BLADDER TUMOR  12-22-2010  . TRANSURETHRAL RESECTION OF BLADDER TUMOR  08/04/2011   Procedure: TRANSURETHRAL RESECTION OF BLADDER TUMOR (TURBT);  Surgeon: [name], MD;  Location: [facility];  Service: Urology;  Laterality: N/A;  . TRANSURETHRAL RESECTION OF BLADDER TUMOR N/A 01/13/2014   Procedure: TRANSURETHRAL RESECTION OF BLADDER TUMOR (TURBT);  Surgeon: [name], MD;  Location: [facility];  Service: Urology;  Laterality: N/A;    Home Medications:  Allergies as of 05/24/2016      Reactions   Penicillins Hives   BLISTERS      Medication List       Accurate as of 05/24/16 10:56 AM. Always use your most recent med list.          acetaminophen 325 MG tablet Commonly known as:  TYLENOL Take 650 mg by mouth every 4 (four) hours as needed.   aspirin 81 MG tablet Take 81 mg by mouth daily.   furosemide  40 MG tablet Commonly known as:  LASIX Take 40 mg by mouth daily.   isosorbide mononitrate 30 MG 24 hr tablet Commonly known as:  IMDUR Take 30 mg by mouth at bedtime.   latanoprost 0.005 % ophthalmic solution Commonly known as:  XALATAN Place 1 drop into both eyes at bedtime.   losartan 50 MG tablet Commonly known as:  COZAAR Take 1 tablet by mouth every morning.   lovastatin 20 MG tablet Commonly known as:  MEVACOR Take 20 mg by mouth at bedtime.   metoprolol tartrate 25 MG tablet Commonly known as:  LOPRESSOR Take 12.5 mg by mouth every 12 (twelve) hours.   multivitamins ther. w/minerals Tabs tablet Take 1 tablet by mouth daily.   ranitidine 150 MG tablet Commonly known as:  ZANTAC Take 150 mg by mouth at bedtime.   senna-docusate 8.6-50 MG tablet Commonly known as:  Senokot-S Take 1 tablet by mouth 2 (two) times daily. While taking pain meds to prevent constipation       Allergies:  Allergies  Allergen Reactions  . Penicillins Hives    BLISTERS    Family History: Family History  Problem Relation Age of Onset  . Heart attack Mother   . Heart attack Father   . Prostate cancer Neg Hx   . Bladder Cancer Neg Hx   . Kidney cancer Neg Hx     Social History:  reports that he quit smoking about 15 years ago. His smoking use included Cigarettes. He has a 50.00 pack-year smoking history. He has never used smokeless tobacco. He reports that he does not drink alcohol or use drugs.  ROS:                                        Physical Exam: There were no vitals taken for this visit.  Constitutional:  Alert and oriented, No acute distress. HEENT: [place] AT, moist mucus membranes.  Trachea midline, no masses. Cardiovascular: No clubbing, cyanosis, or edema. Respiratory: Normal respiratory effort, no increased work of breathing. GI: Abdomen is soft, nontender, nondistended, no abdominal masses GU: No CVA tenderness.  DRE: prostate ~ 40 grams,  no hard area or nodules, median sulcus and lateral edges all preserved.  Skin: No rashes, bruises or suspicious lesions. Lymph: No cervical or inguinal adenopathy. Neurologic: Grossly intact, no focal deficits, moving all 4 extremities. Psychiatric: Normal mood and affect.  Laboratory Data: Lab Results  Component Value Date   WBC 7.1 03/27/2015   HGB 12.0 (L) 03/27/2015   HCT 36.9 (L) 03/27/2015   MCV 93.2 03/27/2015   PLT 237 03/27/2015    Lab Results  Component Value Date   CREATININE 1.82 (H) 03/27/2015    No results found for: PSA  No results found for: TESTOSTERONE  No results found for: HGBA1C  Urinalysis    Component Value Date/Time   COLORURINE YELLOW 08/28/2013 1650   APPEARANCEUR Clear 02/28/2016 1031   LABSPEC 1.009 08/28/2013 1650   LABSPEC 1.035 07/07/2011 1544   PHURINE 5.5 08/28/2013 1650   GLUCOSEU Negative 02/28/2016 1031   GLUCOSEU Negative 07/07/2011 1544   HGBUR LARGE (A) 08/28/2013 1650   BILIRUBINUR Negative 02/28/2016 1031   BILIRUBINUR Negative 07/07/2011 1544   KETONESUR NEGATIVE 08/28/2013 1650   PROTEINUR Trace (A) 02/28/2016 1031   PROTEINUR 30 (A) 08/28/2013 1650   UROBILINOGEN 0.2 08/28/2013 1650   NITRITE Negative 02/28/2016 1031   NITRITE NEGATIVE 08/28/2013 1650   LEUKOCYTESUR Negative 02/28/2016 1031   LEUKOCYTESUR Negative 07/07/2011 1544      Assessment & Plan:    1) bladder ca -  See in 3 months for cystoscopy.   2) PSa - we talked again about nature of PSA elevation - benign and malignant causes. Discussed possibility of PCa, but his DRE feels more benign today. Discussed nature r/b of surveillance vs biopsy and we'll continue surveillance. Will get another PSA in 6-9 mo.   There are no diagnoses linked to this encounter.  No Follow-up on file.  Festus Aloe, [facility] [address] [place], [place] [address] [phone]

## 2016-05-25 LAB — CUP PACEART REMOTE DEVICE CHECK
Battery Impedance: 158 Ohm
Battery Voltage: 2.79 V
Brady Statistic AP VP Percent: 73 %
Brady Statistic AS VP Percent: 27 %
Brady Statistic AS VS Percent: 0 %
Implantable Lead Implant Date: 20150903
Implantable Lead Location: 753859
Implantable Lead Model: 5076
Implantable Pulse Generator Implant Date: 20150903
Lead Channel Impedance Value: 506 Ohm
Lead Channel Impedance Value: 537 Ohm
Lead Channel Setting Pacing Amplitude: 2 V
Lead Channel Setting Pacing Amplitude: 2.5 V
Lead Channel Setting Sensing Sensitivity: 2.8 mV
MDC IDC LEAD IMPLANT DT: 20150903
MDC IDC LEAD LOCATION: 753860
MDC IDC MSMT BATTERY REMAINING LONGEVITY: 115 mo
MDC IDC MSMT LEADCHNL RA PACING THRESHOLD AMPLITUDE: 0.75 V
MDC IDC MSMT LEADCHNL RA PACING THRESHOLD PULSEWIDTH: 0.4 ms
MDC IDC MSMT LEADCHNL RV PACING THRESHOLD AMPLITUDE: 0.75 V
MDC IDC MSMT LEADCHNL RV PACING THRESHOLD PULSEWIDTH: 0.4 ms
MDC IDC SESS DTM: 20180522112405
MDC IDC SET LEADCHNL RV PACING PULSEWIDTH: 0.4 ms
MDC IDC STAT BRADY AP VS PERCENT: 0 %

## 2016-08-08 ENCOUNTER — Ambulatory Visit (INDEPENDENT_AMBULATORY_CARE_PROVIDER_SITE_OTHER): Payer: Medicare Other | Admitting: Internal Medicine

## 2016-08-08 ENCOUNTER — Encounter: Payer: Self-pay | Admitting: Internal Medicine

## 2016-08-08 VITALS — BP 136/80 | HR 75 | Ht 69.0 in | Wt 197.5 lb

## 2016-08-08 DIAGNOSIS — R001 Bradycardia, unspecified: Secondary | ICD-10-CM

## 2016-08-08 DIAGNOSIS — Z950 Presence of cardiac pacemaker: Secondary | ICD-10-CM | POA: Diagnosis not present

## 2016-08-08 DIAGNOSIS — I441 Atrioventricular block, second degree: Secondary | ICD-10-CM

## 2016-08-08 LAB — CUP PACEART INCLINIC DEVICE CHECK
Battery Impedance: 181 Ohm
Battery Remaining Longevity: 110 mo
Battery Voltage: 2.79 V
Brady Statistic AP VP Percent: 74 %
Brady Statistic AS VP Percent: 25 %
Implantable Lead Implant Date: 20150903
Implantable Lead Implant Date: 20150903
Implantable Lead Location: 753859
Implantable Lead Model: 5076
Implantable Lead Model: 5076
Implantable Pulse Generator Implant Date: 20150903
Lead Channel Impedance Value: 492 Ohm
Lead Channel Pacing Threshold Amplitude: 0.75 V
Lead Channel Pacing Threshold Pulse Width: 0.4 ms
Lead Channel Sensing Intrinsic Amplitude: 4 mV
Lead Channel Setting Pacing Amplitude: 2 V
Lead Channel Setting Pacing Amplitude: 2.5 V
Lead Channel Setting Pacing Pulse Width: 0.4 ms
MDC IDC LEAD LOCATION: 753860
MDC IDC MSMT LEADCHNL RV IMPEDANCE VALUE: 513 Ohm
MDC IDC MSMT LEADCHNL RV PACING THRESHOLD AMPLITUDE: 0.75 V
MDC IDC MSMT LEADCHNL RV PACING THRESHOLD PULSEWIDTH: 0.4 ms
MDC IDC MSMT LEADCHNL RV SENSING INTR AMPL: 11.2 mV
MDC IDC SESS DTM: 20180807150802
MDC IDC SET LEADCHNL RV SENSING SENSITIVITY: 2.8 mV
MDC IDC STAT BRADY AP VS PERCENT: 0 %
MDC IDC STAT BRADY AS VS PERCENT: 0 %

## 2016-08-08 NOTE — Patient Instructions (Signed)
Medication Instructions: - Your physician recommends that you continue on your current medications as directed. Please refer to the Current Medication list given to you today.  Labwork: - none ordered  Procedures/Testing: - none ordered  Follow-Up: - Remote monitoring is used to monitor your Pacemaker of ICD from home. This monitoring reduces the number of office visits required to check your device to one time per year. It allows [place] to keep an eye on the functioning of your device to ensure it is working properly. You are scheduled for a device check from home on 11/07/16. You may send your transmission at any time that day. If you have a wireless device, the transmission will be sent automatically. After your physician reviews your transmission, you will receive a postcard with your next transmission date.  - Your physician wants you to follow-up in: 1 year with Dr. Caryl Comes. You will receive a reminder letter in the mail two months in advance. If you don't receive a letter, please call our office to schedule the follow-up appointment.   Any Additional Special Instructions Will Be Listed Below (If Applicable).     If you need a refill on your cardiac medications before your next appointment, please call your pharmacy.

## 2016-08-08 NOTE — Progress Notes (Signed)
Patient Care Team: [name], MD as PCP - General (Internal Medicine)   HPI  Jacob [name] is a 83 y.o. male Seen in follow-up for a pacemaker implanted 8/15 by Dr. Elliot Cousin  for symptomatic bradycardia and 2-1 heart block.  He had an antecedent history of left bundle branch block and coronary artery disease with preserved LV function. Echo 8/15 demonstrated EF 60-65% without wall motion abnormalities.  He has a history of atrial fibrillation and hypertension.  He was discharged with aspirin;  no atrial fibrillation however has been recorded on his device.  He has a history of renal cell carcinoma for which he underwent nephrectomy. He has modest persistent renal insufficiency. Notes 2/18 [facility] nephrology were reviewed  Date Cr K Hgb  3/18 2.27 4.8 12.9             Past Medical History:  Diagnosis Date  . Arthritis    hands  . At risk for sleep apnea    STOP-BANG=  4     SENT TO PCP 06-19-5013  . BPH (benign prostatic hypertrophy)   . Coronary artery disease cardiologist-- dr Fletcher Anon  and dr gregg taylor   a. 2006 s/p PCI/Stenting x 2 to the RCA;  b. 05/03/12 Cardiac CT: Patent RCA stent, nonobs dzs;  c. 01/2013 Ex MV: nl EF, no ischemia.  Marland Kitchen GERD (gastroesophageal reflux disease)   . Glaucoma of both eyes   . H/O hiatal hernia   . History of atrial fibrillation    a. 2006.  Marland Kitchen History of diverticulitis   . History of kidney stones   . History of renal cell carcinoma    08-22-2013  s/p  left nephrouretectomy  . Hyperlipidemia   . Hyperlipidemia   . Hypertension   . LBBB (left bundle branch block)   . Mobitz type 2 second degree AV block   . Recurrent bladder transitional cell carcinoma ([place]) monitored by dr [name]   s/p turb's  and chemo bladder instillations  . Renal insufficiency    a. Creat rose to 2.34 post-op L nephrectomy.  . S/P placement of cardiac pacemaker MEDTRONIC last pacer check 12-01-2013   08-29-2013  for symptomatic bradycardia and  mobitz 2 second degree heart block  . Symptomatic bradycardia    s/p  pacemaker placement 08-29-2013    Past Surgical History:  Procedure Laterality Date  . APPENDECTOMY  02/1988  . CATARACT EXTRACTION W/ INTRAOCULAR LENS  IMPLANT, BILATERAL  2011  . CORONARY ANGIOPLASTY WITH STENT PLACEMENT  2006    [facility]   X2 STENTS  TO RCA  . CYSTOSCOPY W/ RETROGRADES Right 01/13/2014   Procedure: CYSTOSCOPY WITH RETROGRADE PYELOGRAM;  Surgeon: [name], MD;  Location: [facility];  Service: Urology;  Laterality: Right;  . CYSTOSCOPY W/ URETERAL STENT PLACEMENT Left 08/22/2013   Procedure: CYSTOSCOPY WITH RETROGRADE PYELOGRAM/URETERAL STENT PLACEMENT;  Surgeon: [name], MD;  Location: [facility];  Service: Urology;  Laterality: Left;  . CYSTOSCOPY WITH BIOPSY  04/13/2011     Procedure: CYSTOSCOPY WITH BIOPSY;  Surgeon: [name], MD;  Location: [facility];  Service: Urology;  Laterality: N/A;  Cold Cup Biopsy  . CYSTOSCOPY WITH RETROGRADE PYELOGRAM, URETEROSCOPY AND STENT PLACEMENT Left 06/19/2013   Procedure: CYSTOSCOPY WITH LEFT RETROGRADE PYELOGRAM, LEFT URETEROSCOPY, ureteral balloon dilatation, BIOPSY LEFT KIDNEY;  Surgeon: [name], MD;  Location: [facility];  Service: Urology;  Laterality: Left;  . INGUINAL HERNIA REPAIR Left 08/22/2013  Procedure: LAPAROSCOPIC INGUINAL HERNIA;  Surgeon: [name], MD;  Location: [facility];  Service: Urology;  Laterality: Left;  . PERMANENT PACEMAKER INSERTION N/A 08/29/2013   Procedure: PERMANENT PACEMAKER INSERTION;  Surgeon: [name], MD;  Location: [facility];  Service: Cardiovascular;  Laterality: N/A;  Medtronic dual-chamber  . ROBOT ASSITED LAPAROSCOPIC NEPHROURETERECTOMY Left 08/22/2013   Procedure: ROBOT ASSITED LAPAROSCOPIC NEPHROURETERECTOMY;  Surgeon: [name], MD;  Location: [facility];  Service: Urology;  Laterality: Left;  . TRANSTHORACIC ECHOCARDIOGRAM  08-29-2013   mild LVH/  ef 60-65%  /  grade I diastolic dysfunction/  mild MR/  mild LAE  and RAE/  moderate TR  . TRANSURETHRAL RESECTION OF BLADDER TUMOR  12-22-2010  . TRANSURETHRAL RESECTION OF BLADDER TUMOR  08/04/2011   Procedure: TRANSURETHRAL RESECTION OF BLADDER TUMOR (TURBT);  Surgeon: [name], MD;  Location: [facility];  Service: Urology;  Laterality: N/A;  . TRANSURETHRAL RESECTION OF BLADDER TUMOR N/A 01/13/2014   Procedure: TRANSURETHRAL RESECTION OF BLADDER TUMOR (TURBT);  Surgeon: [name], MD;  Location: [facility];  Service: Urology;  Laterality: N/A;    Current Outpatient Prescriptions  Medication Sig Dispense Refill  . acetaminophen (TYLENOL) 325 MG tablet Take 650 mg by mouth every 4 (four) hours as needed.    Marland Kitchen aspirin 81 MG tablet Take 81 mg by mouth daily.    . furosemide (LASIX) 40 MG tablet Take 40 mg by mouth daily as needed.     . isosorbide mononitrate (IMDUR) 30 MG 24 hr tablet Take 30 mg by mouth at bedtime.     Marland Kitchen latanoprost (XALATAN) 0.005 % ophthalmic solution Place 1 drop into both eyes at bedtime.     Marland Kitchen levocetirizine (XYZAL) 5 MG tablet Take 5 mg by mouth every evening.     Marland Kitchen losartan (COZAAR) 50 MG tablet Take 1 tablet by mouth every morning.     . lovastatin (MEVACOR) 20 MG tablet Take 20 mg by mouth at bedtime.     . metoprolol tartrate (LOPRESSOR) 25 MG tablet Take 12.5 mg by mouth every 12 (twelve) hours.    . Multiple Vitamins-Minerals (MULTIVITAMINS THER. W/MINERALS) TABS Take 1 tablet by mouth daily.     . Omega-3 Fatty Acids (FISH OIL) 1200 MG CPDR Take by mouth.    Marland Kitchen omeprazole (PRILOSEC) 20 MG capsule Take 20 mg by mouth daily.    Marland Kitchen PROAIR HFA 108 (90 Base) MCG/ACT inhaler Inhale 1 puff into the lungs every 4 (four) hours as needed.     . senna-docusate (SENOKOT-S) 8.6-50 MG per tablet Take 1 tablet by mouth 2 (two) times daily. While taking pain meds to prevent constipation 30 tablet 0   No current facility-administered  medications for this visit.     Allergies  Allergen Reactions  . Penicillins Hives    BLISTERS      Review of Systems negative except from HPI and PMH  Physical Exam BP 136/80 (BP Location: Left Arm, Patient Position: Sitting, Cuff Size: Normal)   Pulse 75   Ht 5\' 9"  (1.753 m)   Wt 197 lb 8 oz (89.6 kg)   BMI 29.17 kg/m  Well developed and nourished in no acute distress HENT normal Neck supple with JVP-flat Wheezes and decreased air movement Device pocket well healed; without hematoma or erythema.  There is no tethering   Regular rate and rhythm, no murmurs or gallops Abd-soft with active BS No Clubbing cyanosis edema Skin-warm and dry A &  Oriented  Grossly normal sensory and motor function   ECG demonstrates AV pacing.   Assessment and  Plan  Hypertension  Symptomatic bradycardia with 2:1 block  Bidirectional bundle branch block  Ventricular tachycardia-nonsustained  COPD Pacemaker-Medtronic The patient's device was interrogated.  The information was reviewed. No changes were made in the programming.     Blood Pressure well controlled    Heart rate excursion is better but he still has poor exercise tolerance. Based on his lung examination and his CT scan describing emphysema I wonder if most of his dyspnea is not related to this. I suggested that he talk to Dr. Vedia Coffer about pulmonary consultation as dyspnea is his major limitation   Ventricular tachycardia-nonsustained in the context of normal LV function so. We will just follow.

## 2016-08-15 ENCOUNTER — Telehealth: Payer: Self-pay | Admitting: Urology

## 2016-08-15 NOTE — Telephone Encounter (Signed)
Patient lm on vm to cx app did not want to reschd   Sharyn Lull

## 2016-08-22 ENCOUNTER — Other Ambulatory Visit: Payer: Medicare Other

## 2016-11-07 ENCOUNTER — Ambulatory Visit (INDEPENDENT_AMBULATORY_CARE_PROVIDER_SITE_OTHER): Payer: Medicare Other | Admitting: *Deleted

## 2016-11-07 DIAGNOSIS — R001 Bradycardia, unspecified: Secondary | ICD-10-CM | POA: Diagnosis not present

## 2016-11-07 NOTE — Progress Notes (Signed)
Remote pacemaker transmission.   

## 2016-11-09 ENCOUNTER — Encounter: Payer: Self-pay | Admitting: Cardiology

## 2016-11-11 LAB — CUP PACEART REMOTE DEVICE CHECK
Battery Impedance: 181 Ohm
Brady Statistic AP VP Percent: 74 %
Brady Statistic AP VS Percent: 0 %
Brady Statistic AS VS Percent: 0 %
Date Time Interrogation Session: 20181106131610
Implantable Lead Implant Date: 20150903
Implantable Lead Location: 753860
Implantable Lead Model: 5076
Implantable Lead Model: 5076
Lead Channel Impedance Value: 513 Ohm
Lead Channel Pacing Threshold Amplitude: 0.75 V
Lead Channel Pacing Threshold Amplitude: 0.75 V
Lead Channel Pacing Threshold Pulse Width: 0.4 ms
Lead Channel Setting Pacing Pulse Width: 0.4 ms
MDC IDC LEAD IMPLANT DT: 20150903
MDC IDC LEAD LOCATION: 753859
MDC IDC MSMT BATTERY REMAINING LONGEVITY: 110 mo
MDC IDC MSMT BATTERY VOLTAGE: 2.79 V
MDC IDC MSMT LEADCHNL RA IMPEDANCE VALUE: 484 Ohm
MDC IDC MSMT LEADCHNL RV PACING THRESHOLD PULSEWIDTH: 0.4 ms
MDC IDC PG IMPLANT DT: 20150903
MDC IDC SET LEADCHNL RA PACING AMPLITUDE: 2 V
MDC IDC SET LEADCHNL RV PACING AMPLITUDE: 2.5 V
MDC IDC SET LEADCHNL RV SENSING SENSITIVITY: 2.8 mV
MDC IDC STAT BRADY AS VP PERCENT: 26 %

## 2016-11-13 ENCOUNTER — Ambulatory Visit
Admission: RE | Admit: 2016-11-13 | Discharge: 2016-11-13 | Disposition: A | Discharge status: Home / Self Care | Payer: Medicare Other | Source: Ambulatory Visit | Attending: Oncology | Admitting: Oncology

## 2016-11-13 DIAGNOSIS — J432 Centrilobular emphysema: Secondary | ICD-10-CM | POA: Diagnosis not present

## 2016-11-13 DIAGNOSIS — Z905 Acquired absence of kidney: Secondary | ICD-10-CM | POA: Diagnosis not present

## 2016-11-13 DIAGNOSIS — I2510 Atherosclerotic heart disease of native coronary artery without angina pectoris: Secondary | ICD-10-CM | POA: Diagnosis not present

## 2016-11-13 DIAGNOSIS — I700 Atherosclerosis of aorta: Secondary | ICD-10-CM | POA: Diagnosis not present

## 2016-11-13 DIAGNOSIS — K769 Liver disease, unspecified: Secondary | ICD-10-CM | POA: Diagnosis not present

## 2016-11-13 DIAGNOSIS — R918 Other nonspecific abnormal finding of lung field: Secondary | ICD-10-CM | POA: Insufficient documentation

## 2016-11-13 DIAGNOSIS — R911 Solitary pulmonary nodule: Secondary | ICD-10-CM

## 2016-11-13 LAB — POCT I-STAT CREATININE: CREATININE: 1.9 mg/dL — AB (ref 0.61–1.24)

## 2016-11-13 MED ORDER — IOPAMIDOL (ISOVUE-300) INJECTION 61%
60.0000 mL | Freq: Once | INTRAVENOUS | Status: AC | PRN
  Administered 2016-11-13: 60 mL via INTRAVENOUS

## 2016-11-15 NOTE — Progress Notes (Signed)
[facility]  [phone] [phone]) [phone]  ID: [name] OB: 10/12/1928  MR#: [id number]  IHK#:[id number]  Patient Care Team: [name], MD as PCP - General (Internal Medicine)  CHIEF COMPLAINT: Nodule of the left lung.  INTERVAL HISTORY: Patient returns to clinic today for further evaluation and discussion of his imaging results.  He continues to feel well and is asymptomatic.  He has no neurologic complaints. He denies any recent fevers or illnesses. He has a good appetite and denies weight loss. He denies any chest pain, shortness of breath, cough, or hemoptysis. He has no nausea, vomiting, constipation, or diarrhea. He has no new urinary complaints. Patient feels at his baseline and offers no specific complaints today.  REVIEW OF SYSTEMS:   Review of Systems  Constitutional: Negative.  Negative for fever, malaise/fatigue and weight loss.  Respiratory: Negative.  Negative for cough, hemoptysis and shortness of breath.   Cardiovascular: Negative.  Negative for chest pain and leg swelling.  Gastrointestinal: Negative.  Negative for abdominal pain.  Genitourinary: Negative.   Musculoskeletal: Negative.   Skin: Negative.  Negative for rash.  Neurological: Negative.  Negative for sensory change and weakness.  Psychiatric/Behavioral: Negative.  The patient is not nervous/anxious.     As per HPI. Otherwise, a complete review of systems is negative.  PAST MEDICAL HISTORY: Past Medical History:  Diagnosis Date  . Arthritis    hands  . At risk for sleep apnea    STOP-BANG=  4     SENT TO PCP 06-19-5013  . BPH (benign prostatic hypertrophy)   . Coronary artery disease cardiologist-- dr Fletcher Anon  and dr gregg taylor   a. 2006 s/p PCI/Stenting x 2 to the RCA;  b. 05/03/12 Cardiac CT: Patent RCA stent, nonobs dzs;  c. 01/2013 Ex MV: nl EF, no ischemia.  Marland Kitchen GERD (gastroesophageal reflux disease)   . Glaucoma of both eyes   . H/O hiatal hernia   .  History of atrial fibrillation    a. 2006.  Marland Kitchen History of diverticulitis   . History of kidney stones   . History of renal cell carcinoma    08-22-2013  s/p  left nephrouretectomy  . Hyperlipidemia   . Hyperlipidemia   . Hypertension   . LBBB (left bundle branch block)   . Mobitz type 2 second degree AV block   . Recurrent bladder transitional cell carcinoma ([place]) monitored by dr [name]   s/p turb's  and chemo bladder instillations  . Renal insufficiency    a. Creat rose to 2.34 post-op L nephrectomy.  . S/P placement of cardiac pacemaker [employer] last pacer check 12-01-2013   08-29-2013  for symptomatic bradycardia and mobitz 2 second degree heart block  . Symptomatic bradycardia    s/p  pacemaker placement 08-29-2013    PAST SURGICAL HISTORY: Past Surgical History:  Procedure Laterality Date  . APPENDECTOMY  02/1988  . CATARACT EXTRACTION W/ INTRAOCULAR LENS  IMPLANT, BILATERAL  2011  . CORONARY ANGIOPLASTY WITH STENT PLACEMENT  2006    [facility]   X2 STENTS  TO RCA  . CYSTOSCOPY W/ RETROGRADES Right 01/13/2014   Procedure: CYSTOSCOPY WITH RETROGRADE PYELOGRAM;  Surgeon: [name], MD;  [facility]  Service: Urology;  Laterality: Right;  . CYSTOSCOPY W/ URETERAL STENT PLACEMENT Left 08/22/2013   Procedure: CYSTOSCOPY WITH RETROGRADE PYELOGRAM/URETERAL STENT PLACEMENT;  Surgeon: [name], MD;  Location: [facility];  Service: Urology;  Laterality: Left;  . CYSTOSCOPY WITH BIOPSY  04/13/2011     Procedure: CYSTOSCOPY WITH BIOPSY;  Surgeon: [name], MD;  Location: [facility];  Service: Urology;  Laterality: N/A;  Cold Cup Biopsy  . CYSTOSCOPY WITH RETROGRADE PYELOGRAM, URETEROSCOPY AND STENT PLACEMENT Left 06/19/2013   Procedure: CYSTOSCOPY WITH LEFT RETROGRADE PYELOGRAM, LEFT URETEROSCOPY, ureteral balloon dilatation, BIOPSY LEFT KIDNEY;  Surgeon: [name], MD;  Location: [facility];  Service: Urology;  Laterality:  Left;  . INGUINAL HERNIA REPAIR Left 08/22/2013   Procedure: LAPAROSCOPIC INGUINAL HERNIA;  Surgeon: [name], MD;  Location: [facility];  Service: Urology;  Laterality: Left;  . PERMANENT PACEMAKER INSERTION N/A 08/29/2013   Procedure: PERMANENT PACEMAKER INSERTION;  Surgeon: [name], MD;  Location: [facility] LAB;  Service: Cardiovascular;  Laterality: N/A;  Medtronic dual-chamber  . ROBOT ASSITED LAPAROSCOPIC NEPHROURETERECTOMY Left 08/22/2013   Procedure: ROBOT ASSITED LAPAROSCOPIC NEPHROURETERECTOMY;  Surgeon: [name], MD;  Location: [facility];  Service: Urology;  Laterality: Left;  . TRANSTHORACIC ECHOCARDIOGRAM  08-29-2013   mild LVH/  ef 60-65% /  grade I diastolic dysfunction/  mild MR/  mild LAE  and RAE/  moderate TR  . TRANSURETHRAL RESECTION OF BLADDER TUMOR  12-22-2010  . TRANSURETHRAL RESECTION OF BLADDER TUMOR  08/04/2011   Procedure: TRANSURETHRAL RESECTION OF BLADDER TUMOR (TURBT);  Surgeon: [name], MD;  Location: [facility];  Service: Urology;  Laterality: N/A;  . TRANSURETHRAL RESECTION OF BLADDER TUMOR N/A 01/13/2014   Procedure: TRANSURETHRAL RESECTION OF BLADDER TUMOR (TURBT);  Surgeon: [name], MD;  Location: [facility];  Service: Urology;  Laterality: N/A;    FAMILY HISTORY: Family History  Problem Relation Age of Onset  . Heart attack Mother   . Heart attack Father   . Prostate cancer Neg Hx   . Bladder Cancer Neg Hx   . Kidney cancer Neg Hx     ADVANCED DIRECTIVES (Y/N):  N  HEALTH MAINTENANCE: Social History   Tobacco Use  . Smoking status: Former Smoker    Packs/day: 1.00    Years: 50.00    Pack years: 50.00    Types: Cigarettes    Last attempt to quit: 11/02/2000    Years since quitting: 16.0  . Smokeless tobacco: Never Used  Substance Use Topics  . Alcohol use: No  . Drug use: No     Colonoscopy:  PAP:  Bone density:  Lipid panel:  Allergies  Allergen Reactions  . Penicillins  Hives    BLISTERS    Current Outpatient Medications  Medication Sig Dispense Refill  . acetaminophen (TYLENOL) 325 MG tablet Take 650 mg by mouth every 4 (four) hours as needed.    Marland Kitchen aspirin 81 MG tablet Take 81 mg by mouth daily.    Marland Kitchen DEXILANT 60 MG capsule     . furosemide (LASIX) 40 MG tablet Take 40 mg by mouth daily as needed.     . isosorbide mononitrate (IMDUR) 30 MG 24 hr tablet Take 30 mg by mouth at bedtime.     Marland Kitchen latanoprost (XALATAN) 0.005 % ophthalmic solution Place 1 drop into both eyes at bedtime.     Marland Kitchen losartan (COZAAR) 50 MG tablet Take 1 tablet by mouth every morning.     . lovastatin (MEVACOR) 20 MG tablet Take 20 mg by mouth at bedtime.     . metoprolol tartrate (LOPRESSOR) 25 MG tablet Take 12.5 mg by mouth every 12 (twelve) hours.    . Multiple Vitamins-Minerals (MULTIVITAMINS THER.  W/MINERALS) TABS Take 1 tablet by mouth daily.     . Omega-3 Fatty Acids (FISH OIL) 1200 MG CPDR Take by mouth.    Marland Kitchen PROAIR HFA 108 (90 Base) MCG/ACT inhaler Inhale 1 puff into the lungs every 4 (four) hours as needed.     . senna-docusate (SENOKOT-S) 8.6-50 MG per tablet Take 1 tablet by mouth 2 (two) times daily. While taking pain meds to prevent constipation 30 tablet 0   No current facility-administered medications for this visit.     OBJECTIVE: Vitals:   11/16/16 1356  BP: (!) 157/69  Pulse: 85  Resp: 20  Temp: (!) 97.5 F (36.4 C)     Body mass index is 28.8 kg/m.    ECOG FS:0 - Asymptomatic  General: Well-developed, well-nourished, no acute distress. Eyes: Pink conjunctiva, anicteric sclera. Lungs: Clear to auscultation bilaterally. Heart: Regular rate and rhythm. No rubs, murmurs, or gallops. Abdomen: Soft, nontender, nondistended. No organomegaly noted, normoactive bowel sounds. Musculoskeletal: No edema, cyanosis, or clubbing. Neuro: Alert, answering all questions appropriately. Cranial nerves grossly intact. Skin: No rashes or petechiae noted. Psych: Normal  affect.   LAB RESULTS:  Lab Results  Component Value Date   NA 141 03/27/2015   K 4.4 03/27/2015   CL 106 03/27/2015   CO2 31 03/27/2015   GLUCOSE 146 (H) 03/27/2015   BUN 30 (H) 03/27/2015   CREATININE 1.90 (H) 11/13/2016   CALCIUM 8.4 (L) 03/27/2015   PROT 7.6 07/07/2011   ALBUMIN 3.6 07/07/2011   AST 23 07/07/2011   ALT 23 07/07/2011   ALKPHOS 69 07/07/2011   BILITOT 0.5 07/07/2011   GFRNONAA 32 (L) 03/27/2015   GFRAA 38 (L) 03/27/2015    Lab Results  Component Value Date   WBC 7.1 03/27/2015   NEUTROABS 5.1 03/27/2015   HGB 12.0 (L) 03/27/2015   HCT 36.9 (L) 03/27/2015   MCV 93.2 03/27/2015   PLT 237 03/27/2015     STUDIES: Ct Chest W Contrast  Result Date: 11/13/2016 CLINICAL DATA:  Bladder and renal cancer. Left lower lobe pulmonary nodule. Ex-smoker. EXAM: CT CHEST WITH CONTRAST TECHNIQUE: Multidetector CT imaging of the chest was performed during intravenous contrast administration. CONTRAST:  36mL ISOVUE-300 IOPAMIDOL (ISOVUE-300) INJECTION 61% COMPARISON:  No supraclavicular adenopathy. No mediastinal or hilar adenopathy. FINDINGS: Cardiovascular: Pacer. Aortic and branch vessel atherosclerosis. Tortuous thoracic aorta. Normal heart size, without pericardial effusion. Multivessel coronary artery atherosclerosis. No central pulmonary embolism, on this non-dedicated study. Mediastinum/Nodes: No mediastinal or hilar adenopathy. Lungs/Pleura: No pleural fluid. Moderate centrilobular emphysema with biapical pleural-parenchymal scarring. Triangular nodule within the right middle lobe measures 8 mm on image 110/ series 3 versus 6 mm on the prior. Morphology favors the subpleural lymph node. 5 mm right lower lobe pulmonary nodule is unchanged on image 115/series 3. A subpleural 4 mm nodule in the right lower lobe on image 105/series 3 is similar. Pleural-based lingular nodule measures 9 x 8 mm on image 107/series 3 versus 7 x 7 mm on the prior exam (when remeasured). 12 mm  craniocaudal on image 163 sagittal. 9 mm on the prior. Similar 3 mm left apical pulmonary nodule on image 44/series 3. Upper Abdomen: 3.4 cm left subcapsular hypoattenuating liver lesion is at the site of a 2.8 cm fluid density lesion back on 01/06/2014. This was fluid density back on 07/27/2011. Normal imaged portions of the spleen, pancreas, right adrenal gland, right kidney. Proximal gastric underdistention. Mild left adrenal nodularity is present over multiple prior exams. Left nephrectomy. Musculoskeletal:  Moderate thoracic spondylosis. IMPRESSION: 1. Interval enlargement of a lingular nodule, suspicious for developing primary bronchogenic neoplasm versus metastatic disease. This is likely within PET resolution. Should be considered for PET. Other pulmonary nodules are primarily similar. A right middle lobe nodule may have enlarged minimally but has morphology which favors a subpleural lymph node. 2. Left hepatic lobe lesion is favored to represent a cyst, complicated by hemorrhage or proteinaceous material. Greater than fluid density today, but at the site of a simple appearing cyst back in 2013. 3. Coronary artery atherosclerosis. Aortic Atherosclerosis (ICD10-I70.0). 4.  Emphysema (ICD10-J43.9). Electronically Signed   By: Abigail Miyamoto M.D.   On: 11/13/2016 13:12    ASSESSMENT: Nodule of the left lung.  PLAN:    1. Nodule of the left lung: CT scan results from November 13, 2016 reviewed independently and reported as above with mild progression of the subpleural lingular nodule.  Case was previously discussed at cancer conference and it was decided upon a given the slow growth of this tumor as well as the patient's advanced age, observation would be the best strategy. After lengthy discussion with the patient, he does not wish to pursue PET scan, biopsy, or referral to radiation oncology at this time.  He did agree to repeat CT scan in 6 months to assess for interval change.  Return to clinic 1-2 days  after his imaging to discuss the results.   Approximately 20 minutes was spent in discussion of which greater than 50% was consultation.  Patient expressed understanding and was in agreement with this plan. He also understands that He can call clinic at any time with any questions, concerns, or complaints.   Cancer Staging No matching staging information was found for the patient.  Lloyd Huger, MD   11/17/2016 8:55 AM

## 2016-11-16 ENCOUNTER — Inpatient Hospital Stay: Payer: Medicare Other | Attending: Oncology | Admitting: Oncology

## 2016-11-16 ENCOUNTER — Other Ambulatory Visit: Payer: Self-pay

## 2016-11-16 ENCOUNTER — Encounter: Payer: Self-pay | Admitting: Oncology

## 2016-11-16 VITALS — BP 157/69 | HR 85 | Temp 97.5°F | Resp 20 | Wt 195.0 lb

## 2016-11-16 DIAGNOSIS — N400 Enlarged prostate without lower urinary tract symptoms: Secondary | ICD-10-CM | POA: Insufficient documentation

## 2016-11-16 DIAGNOSIS — Z9841 Cataract extraction status, right eye: Secondary | ICD-10-CM | POA: Diagnosis not present

## 2016-11-16 DIAGNOSIS — Z87891 Personal history of nicotine dependence: Secondary | ICD-10-CM | POA: Insufficient documentation

## 2016-11-16 DIAGNOSIS — Z85528 Personal history of other malignant neoplasm of kidney: Secondary | ICD-10-CM | POA: Diagnosis not present

## 2016-11-16 DIAGNOSIS — R911 Solitary pulmonary nodule: Secondary | ICD-10-CM | POA: Diagnosis not present

## 2016-11-16 DIAGNOSIS — Z905 Acquired absence of kidney: Secondary | ICD-10-CM | POA: Diagnosis not present

## 2016-11-16 DIAGNOSIS — Z7982 Long term (current) use of aspirin: Secondary | ICD-10-CM | POA: Insufficient documentation

## 2016-11-16 DIAGNOSIS — Z955 Presence of coronary angioplasty implant and graft: Secondary | ICD-10-CM | POA: Insufficient documentation

## 2016-11-16 DIAGNOSIS — H409 Unspecified glaucoma: Secondary | ICD-10-CM | POA: Insufficient documentation

## 2016-11-16 DIAGNOSIS — I2510 Atherosclerotic heart disease of native coronary artery without angina pectoris: Secondary | ICD-10-CM | POA: Diagnosis not present

## 2016-11-16 DIAGNOSIS — Z79899 Other long term (current) drug therapy: Secondary | ICD-10-CM | POA: Diagnosis not present

## 2016-11-16 DIAGNOSIS — Z87442 Personal history of urinary calculi: Secondary | ICD-10-CM | POA: Insufficient documentation

## 2016-11-16 DIAGNOSIS — I10 Essential (primary) hypertension: Secondary | ICD-10-CM | POA: Insufficient documentation

## 2016-11-16 DIAGNOSIS — I700 Atherosclerosis of aorta: Secondary | ICD-10-CM | POA: Diagnosis not present

## 2016-11-16 DIAGNOSIS — Z9221 Personal history of antineoplastic chemotherapy: Secondary | ICD-10-CM

## 2016-11-16 DIAGNOSIS — K219 Gastro-esophageal reflux disease without esophagitis: Secondary | ICD-10-CM | POA: Insufficient documentation

## 2016-11-16 DIAGNOSIS — Z950 Presence of cardiac pacemaker: Secondary | ICD-10-CM | POA: Diagnosis not present

## 2016-11-16 DIAGNOSIS — K449 Diaphragmatic hernia without obstruction or gangrene: Secondary | ICD-10-CM | POA: Insufficient documentation

## 2016-11-16 DIAGNOSIS — J439 Emphysema, unspecified: Secondary | ICD-10-CM | POA: Insufficient documentation

## 2016-11-16 DIAGNOSIS — I4891 Unspecified atrial fibrillation: Secondary | ICD-10-CM | POA: Insufficient documentation

## 2016-11-16 DIAGNOSIS — E785 Hyperlipidemia, unspecified: Secondary | ICD-10-CM | POA: Diagnosis not present

## 2016-11-16 NOTE — Progress Notes (Signed)
Patient denies any concerns today.  

## 2016-11-29 NOTE — Progress Notes (Signed)
[facility] Consultation      Assessment and Plan:  83 year old male with chronic cough and symptoms of chronic bronchitis and emphysema.  Emphysema/chronic bronchitis with chronic cough. -Discussed that his cough appeared to have improved with use of Symbicort which is consistent with chronic bronchitis. - Is instructed to continue Symbicort 2 puffs twice daily, rinse mouth after use, a new prescription was sent. -He is asked to call us back if his cough recurs or his breathing worsens. -The patient has significant debility/deconditioning, I am not sure that he would be able to coordinate for a full pulmonary function test, will consider spirometry at next visit.  Lung nodule. - We discussed the possible ramifications of the current lung nodule and possible follow-up. - Patient states that he would like treatment if necessary if this was cancer, and would like to keep going "as long as I can". -We will therefore continue serial follow-up of the lung nodules, the patient has a follow-up CAT scan already scheduled in 4 months, we will see him back after that CAT scan has been performed.   Date: 11/30/2016  MRN# [id number] Jacob [name] November 29, 1928    Jacob [name] is a 83 y.o. old male seen in consultation for chief complaint of:    Chief Complaint  Patient presents with  . Advice Only    Referred by Dr. Lysle Rubens for sob and wheezing.   . Lung Lesion    seen on  11/13/16 CT  . Cough    clear mucus patient reports cough wakes him up at night. He has been coughing since June 2018.  Marland Kitchen Shortness of Breath    with exhertion    HPI:   He has been having a cough for about 5 months, he has received z pack and proair, he thinks the zpack may have helped for a few days. Yesterday he got a symbicort inhaler, he took 2 puffs last night, and felt much better.  He smoked 1 ppd until 15 years ago. He drives a car, he does not feel limited by his breathing. He lives  by himself in his own house, he does not cook but only eats out. He does light cleaning and hires someone to clean the house. He hires someone to do the yardwork. He used to do it himself but he got bad allergies, particularly with cleaning leaves in the fall.  He has been diagnosed with chronic bronchitis in the past.   He has a constant runny nose, he takes zyrtec daily which helps. He has reflux symptoms and takes dexilant which helps, but symptoms are not resolved. He dose not eat after 6 pm.  He does not think that he snores at night. He is occasionally takes a nap during the day.   Images personally reviewed; Ct chest 11/13/16;  Diffuse bilateral homogenous emphysema. 9 mm pleural based nodule in lingula larger than previous CT on 04/19/16. Other bilateral nodules. There is mild subcarinal and right hilar lymphadenopathy.    PMHX:   Past Medical History:  Diagnosis Date  . Arthritis    hands  . At risk for sleep apnea    STOP-BANG=  4     SENT TO PCP [id number]  . BPH (benign prostatic hypertrophy)   . Coronary artery disease cardiologist-- dr Fletcher Anon  and dr gregg taylor   a. 2006 s/p PCI/Stenting x 2 to the RCA;  b. 05/03/12 Cardiac CT: Patent RCA stent, nonobs dzs;  c. 01/2013 Ex MV:  nl EF, no ischemia.  Marland Kitchen GERD (gastroesophageal reflux disease)   . Glaucoma of both eyes   . H/O hiatal hernia   . History of atrial fibrillation    a. 2006.  Marland Kitchen History of diverticulitis   . History of kidney stones   . History of renal cell carcinoma    08-22-2013  s/p  left nephrouretectomy  . Hyperlipidemia   . Hyperlipidemia   . Hypertension   . LBBB (left bundle branch block)   . Mobitz type 2 second degree AV block   . Recurrent bladder transitional cell carcinoma ([place]) monitored by dr [name]   s/p turb's  and chemo bladder instillations  . Renal insufficiency    a. Creat rose to 2.34 post-op L nephrectomy.  . S/P placement of cardiac pacemaker MED[employer] last pacer check 12-01-2013    08-29-2013  for symptomatic bradycardia and mobitz 2 second degree heart block  . Symptomatic bradycardia    s/p  pacemaker placement 08-29-2013   Surgical Hx:  Past Surgical History:  Procedure Laterality Date  . APPENDECTOMY  02/1988  . CATARACT EXTRACTION W/ INTRAOCULAR LENS  IMPLANT, BILATERAL  2011  . CORONARY ANGIOPLASTY WITH STENT PLACEMENT  2006    [facility]   X2 STENTS  TO RCA  . CYSTOSCOPY W/ RETROGRADES Right 01/13/2014   Procedure: CYSTOSCOPY WITH RETROGRADE PYELOGRAM;  Surgeon: [name], MD;  Location: [facility];  Service: Urology;  Laterality: Right;  . CYSTOSCOPY W/ URETERAL STENT PLACEMENT Left 08/22/2013   Procedure: CYSTOSCOPY WITH RETROGRADE PYELOGRAM/URETERAL STENT PLACEMENT;  Surgeon: [name], MD;  Location: [facility];  Service: Urology;  Laterality: Left;  . CYSTOSCOPY WITH BIOPSY  04/13/2011     Procedure: CYSTOSCOPY WITH BIOPSY;  Surgeon: [name], MD;  Location: [facility];  Service: Urology;  Laterality: N/A;  Cold Cup Biopsy  . CYSTOSCOPY WITH RETROGRADE PYELOGRAM, URETEROSCOPY AND STENT PLACEMENT Left 06/19/2013   Procedure: CYSTOSCOPY WITH LEFT RETROGRADE PYELOGRAM, LEFT URETEROSCOPY, ureteral balloon dilatation, BIOPSY LEFT KIDNEY;  Surgeon: [name], MD;  Location: [facility];  Service: Urology;  Laterality: Left;  . INGUINAL HERNIA REPAIR Left 08/22/2013   Procedure: LAPAROSCOPIC INGUINAL HERNIA;  Surgeon: [name], MD;  Location: [facility];  Service: Urology;  Laterality: Left;  . PERMANENT PACEMAKER INSERTION N/A 08/29/2013   Procedure: PERMANENT PACEMAKER INSERTION;  Surgeon: [name], MD;  Location: [facility];  Service: Cardiovascular;  Laterality: N/A;  Medtronic dual-chamber  . ROBOT ASSITED LAPAROSCOPIC NEPHROURETERECTOMY Left 08/22/2013   Procedure: ROBOT ASSITED LAPAROSCOPIC NEPHROURETERECTOMY;  Surgeon: [name], MD;  Location: [facility];  Service: Urology;  Laterality: Left;  .  TRANSTHORACIC ECHOCARDIOGRAM  08-29-2013   mild LVH/  ef 60-65% /  grade I diastolic dysfunction/  mild MR/  mild LAE  and RAE/  moderate TR  . TRANSURETHRAL RESECTION OF BLADDER TUMOR  12-22-2010  . TRANSURETHRAL RESECTION OF BLADDER TUMOR  08/04/2011   Procedure: TRANSURETHRAL RESECTION OF BLADDER TUMOR (TURBT);  Surgeon: [name], MD;  Location: [facility];  Service: Urology;  Laterality: N/A;  . TRANSURETHRAL RESECTION OF BLADDER TUMOR N/A 01/13/2014   Procedure: TRANSURETHRAL RESECTION OF BLADDER TUMOR (TURBT);  Surgeon: [name], MD;  Location: [facility];  Service: Urology;  Laterality: N/A;   Family Hx:  Family History  Problem Relation Age of Onset  . Heart attack Mother   . Heart attack Father   . Prostate cancer Neg Hx   .  Bladder Cancer Neg Hx   . Kidney cancer Neg Hx    Social Hx:   Social History   Tobacco Use  . Smoking status: Former Smoker    Packs/day: 1.00    Years: 50.00    Pack years: 50.00    Types: Cigarettes    Last attempt to quit: 11/02/2000    Years since quitting: 16.0  . Smokeless tobacco: Never Used  Substance Use Topics  . Alcohol use: No  . Drug use: No   Medication:    Current Outpatient Medications:  .  acetaminophen (TYLENOL) 325 MG tablet, Take 650 mg by mouth every 4 (four) hours as needed., Disp: , Rfl:  .  aspirin 81 MG tablet, Take 81 mg by mouth daily., Disp: , Rfl:  .  azithromycin (ZITHROMAX) 250 MG tablet, Take 250 mg by mouth as directed., Disp: , Rfl:  .  budesonide-formoterol (SYMBICORT) 160-4.5 MCG/ACT inhaler, Inhale 1 puff into the lungs 2 (two) times daily. , Disp: , Rfl:  .  DEXILANT 60 MG capsule, , Disp: , Rfl:  .  furosemide (LASIX) 40 MG tablet, Take 20 mg by mouth daily as needed. , Disp: , Rfl:  .  isosorbide mononitrate (IMDUR) 30 MG 24 hr tablet, Take 30 mg by mouth at bedtime. , Disp: , Rfl:  .  latanoprost (XALATAN) 0.005 % ophthalmic solution, Place 1 drop into both  eyes at bedtime. , Disp: , Rfl:  .  losartan (COZAAR) 50 MG tablet, Take 1 tablet by mouth every morning. , Disp: , Rfl:  .  lovastatin (MEVACOR) 20 MG tablet, Take 20 mg by mouth at bedtime. , Disp: , Rfl:  .  metoprolol tartrate (LOPRESSOR) 25 MG tablet, Take 12.5 mg by mouth every 12 (twelve) hours., Disp: , Rfl:  .  Multiple Vitamins-Minerals (MULTIVITAMINS THER. W/MINERALS) TABS, Take 1 tablet by mouth daily. , Disp: , Rfl:  .  Omega-3 Fatty Acids (FISH OIL) 1200 MG CPDR, Take by mouth., Disp: , Rfl:  .  predniSONE (DELTASONE) 10 MG tablet, Take 10 mg by mouth as directed., Disp: , Rfl:  .  PROAIR HFA 108 (90 Base) MCG/ACT inhaler, Inhale 1 puff into the lungs every 4 (four) hours as needed. , Disp: , Rfl:  .  senna-docusate (SENOKOT-S) 8.6-50 MG per tablet, Take 1 tablet by mouth 2 (two) times daily. While taking pain meds to prevent constipation, Disp: 30 tablet, Rfl: 0   Allergies:  Penicillins  Review of Systems: Gen:  Denies  fever, sweats, chills HEENT: Denies blurred vision, double vision. bleeds, sore throat Cvc:  No dizziness, chest pain. Resp:   Denies cough or sputum production, shortness of breath Gi: Denies swallowing difficulty, stomach pain. Gu:  Denies bladder incontinence, burning urine Ext:   No Joint pain, stiffness. Skin: No skin rash,  hives  Endoc:  No polyuria, polydipsia. Psych: No depression, insomnia. Other:  All other systems were reviewed with the patient and were negative other that what is mentioned in the HPI.   Physical Examination:   VS: BP 136/90 (BP Location: Left Arm, Cuff Size: Normal)   Pulse 85   Resp 16   Ht 5\' 9"  (1.753 m)   Wt 191 lb (86.6 kg)   SpO2 94%   BMI 28.21 kg/m   General Appearance: No distress  Neuro:without focal findings,  speech normal,  HEENT: PERRLA, EOM intact.   Pulmonary: normal breath sounds, No wheezing.  CardiovascularNormal S1,S2.  No m/r/g.   Abdomen: Benign, Soft,  non-tender. Renal:  No costovertebral  tenderness  GU:  No performed at this time. Endoc: No evident thyromegaly, no signs of acromegaly. Skin:   warm, no rashes, no ecchymosis  Extremities: normal, no cyanosis, clubbing.  Other findings:    LABORATORY PANEL:   CBC No results for input(s): WBC, HGB, HCT, PLT in the last 168 hours. ------------------------------------------------------------------------------------------------------------------  Chemistries  No results for input(s): NA, K, CL, CO2, GLUCOSE, BUN, CREATININE, CALCIUM, MG, AST, ALT, ALKPHOS, BILITOT in the last 168 hours.  Invalid input(s): GFRCGP ------------------------------------------------------------------------------------------------------------------  Cardiac Enzymes No results for input(s): TROPONINI in the last 168 hours. ------------------------------------------------------------  RADIOLOGY:  No results found.     Thank  you for the consultation and for allowing [facility] to assist in the care of your patient. Our recommendations are noted above.  Please contact us if we can be of further service.   Marda Stalker, MD.  Board Certified in Internal Medicine, Pulmonary Medicine, [place], and Sleep Medicine.  [facility] Office Number: [phone]  Patricia Pesa, M.D.  Merton Border, M.D  11/30/2016

## 2016-11-30 ENCOUNTER — Encounter: Payer: Self-pay | Admitting: Internal Medicine

## 2016-11-30 ENCOUNTER — Ambulatory Visit (INDEPENDENT_AMBULATORY_CARE_PROVIDER_SITE_OTHER): Payer: Medicare Other | Admitting: Internal Medicine

## 2016-11-30 VITALS — BP 136/90 | HR 85 | Resp 16 | Ht 69.0 in | Wt 191.0 lb

## 2016-11-30 DIAGNOSIS — J431 Panlobular emphysema: Secondary | ICD-10-CM

## 2016-11-30 DIAGNOSIS — R059 Cough, unspecified: Secondary | ICD-10-CM

## 2016-11-30 DIAGNOSIS — R05 Cough: Secondary | ICD-10-CM | POA: Diagnosis not present

## 2016-11-30 DIAGNOSIS — R911 Solitary pulmonary nodule: Secondary | ICD-10-CM | POA: Diagnosis not present

## 2016-11-30 MED ORDER — BUDESONIDE-FORMOTEROL FUMARATE 160-4.5 MCG/ACT IN AERO: 2.0000 | INHALATION_SPRAY | Freq: Two times a day (BID) | RESPIRATORY_TRACT | 12 refills | Status: DC

## 2016-11-30 NOTE — Patient Instructions (Addendum)
--  2 puffs twice daily, rinse mouth after using.   --We will see you back after your scheduled CT scan in April.   --Call us back sooner if the cough or breathing gets worse before then.

## 2017-01-30 ENCOUNTER — Other Ambulatory Visit: Payer: Self-pay

## 2017-02-06 ENCOUNTER — Ambulatory Visit (INDEPENDENT_AMBULATORY_CARE_PROVIDER_SITE_OTHER): Payer: Medicare Other | Admitting: *Deleted

## 2017-02-06 DIAGNOSIS — I441 Atrioventricular block, second degree: Secondary | ICD-10-CM | POA: Diagnosis not present

## 2017-02-06 NOTE — Progress Notes (Signed)
Remote pacemaker transmission.   

## 2017-02-07 ENCOUNTER — Encounter: Payer: Self-pay | Admitting: Cardiology

## 2017-02-28 LAB — CUP PACEART REMOTE DEVICE CHECK
Battery Impedance: 229 Ohm
Battery Remaining Longevity: 103 mo
Battery Voltage: 2.79 V
Brady Statistic AP VP Percent: 74 %
Implantable Lead Implant Date: 20150903
Implantable Lead Location: 753859
Implantable Lead Model: 5076
Implantable Pulse Generator Implant Date: 20150903
Lead Channel Impedance Value: 484 Ohm
Lead Channel Impedance Value: 524 Ohm
Lead Channel Setting Pacing Amplitude: 2 V
Lead Channel Setting Pacing Amplitude: 2.5 V
Lead Channel Setting Pacing Pulse Width: 0.4 ms
Lead Channel Setting Sensing Sensitivity: 2.8 mV
MDC IDC LEAD IMPLANT DT: 20150903
MDC IDC LEAD LOCATION: 753860
MDC IDC MSMT LEADCHNL RA PACING THRESHOLD AMPLITUDE: 0.875 V
MDC IDC MSMT LEADCHNL RA PACING THRESHOLD PULSEWIDTH: 0.4 ms
MDC IDC MSMT LEADCHNL RV PACING THRESHOLD AMPLITUDE: 0.75 V
MDC IDC MSMT LEADCHNL RV PACING THRESHOLD PULSEWIDTH: 0.4 ms
MDC IDC SESS DTM: 20190205131145
MDC IDC STAT BRADY AP VS PERCENT: 0 %
MDC IDC STAT BRADY AS VP PERCENT: 26 %
MDC IDC STAT BRADY AS VS PERCENT: 0 %

## 2017-04-03 ENCOUNTER — Telehealth: Payer: Self-pay

## 2017-04-03 NOTE — Telephone Encounter (Signed)
Jacob Aloe, MD  Lestine Box, LPN        Notify patient -- overdue for f/u of bladder cancer and cancer of the kidney and elevated PSA. F/u next available.    LMOM

## 2017-04-03 NOTE — Telephone Encounter (Signed)
Pt called office LMOM stating he was returning Chelsea's phone call.  Please advise.  Thank you.

## 2017-04-04 NOTE — Telephone Encounter (Signed)
Jacob [name] spoke with pt and made a f/u with an MD with a possible cysto.

## 2017-04-04 NOTE — Telephone Encounter (Signed)
Pt ended up having many questions in reference to why he needed to have a f/u appt. Pt inquired about why he needed to be seen. Reinforced with pt of bladder cancer.

## 2017-05-08 ENCOUNTER — Ambulatory Visit (INDEPENDENT_AMBULATORY_CARE_PROVIDER_SITE_OTHER): Payer: Medicare Other | Admitting: *Deleted

## 2017-05-08 DIAGNOSIS — I441 Atrioventricular block, second degree: Secondary | ICD-10-CM

## 2017-05-08 NOTE — Progress Notes (Signed)
Remote pacemaker transmission.   

## 2017-05-09 ENCOUNTER — Encounter: Payer: Self-pay | Admitting: Cardiology

## 2017-05-09 ENCOUNTER — Encounter: Payer: Self-pay | Admitting: Urology

## 2017-05-09 ENCOUNTER — Ambulatory Visit (INDEPENDENT_AMBULATORY_CARE_PROVIDER_SITE_OTHER): Payer: Medicare Other | Admitting: Urology

## 2017-05-09 VITALS — BP 156/74 | HR 80 | Ht 69.0 in | Wt 200.0 lb

## 2017-05-09 DIAGNOSIS — Z8551 Personal history of malignant neoplasm of bladder: Secondary | ICD-10-CM

## 2017-05-09 DIAGNOSIS — R972 Elevated prostate specific antigen [PSA]: Secondary | ICD-10-CM

## 2017-05-09 LAB — URINALYSIS, COMPLETE
Bilirubin, UA: NEGATIVE
GLUCOSE, UA: NEGATIVE
KETONES UA: NEGATIVE
LEUKOCYTES UA: NEGATIVE
Nitrite, UA: NEGATIVE
PROTEIN UA: NEGATIVE
RBC, UA: NEGATIVE
Specific Gravity, UA: <1.005 — ABNORMAL LOW (ref 1.005–1.030)
Urobilinogen, Ur: 0.2 mg/dL (ref 0.2–1.0)
pH, UA: 5.5 (ref 5.0–7.5)

## 2017-05-09 NOTE — Progress Notes (Signed)
   05/09/17  CC:  Chief Complaint  Patient presents with  . Cysto    HPI: 84 year old male with personal history of left upper tract urothelial carcinoma as well as bladder cancer who is overdue for surveillance cystoscopy (cancelled previous appt).Marland Kitchen  He presents today to the office for this.  He also has a pulmonary nodule being followed by pulmonology and oncology.  He has a follow-up CT scheduled for later this month.  He has no urinary issues today.  He has not seen any blood in his urine.  He is very hesitant to have cystoscopy today.    1 - Left Renal Pelvis Cancer - s/p left nephroureterectomy 08/2013 for large volume pTaG3 renal pelvis cancer with negative margins by Dr. [name]. He has an incisional/periumbilical hernia.  Post-op Surveillance: 01/2014 - Cysto small left wall bladder tumor (about 2.5cm) / CT No bulky disesae --> TaG3 by TURBT 01/2015 - Cysto / CT NED 02/2016 CT negative apart from 7 mm pulm nodule  05/2016 following with Dr. [name] for pulm nodule    2 - Bladder Cancer -  2012 - TaG3 by TURBT 2013 - Induction BCG x 6 2014 - NED by surv cystos 01/2014 - TaG3 by TURBT --> Re-Induction BCG x6; 04/2014 Cysto NED; 07/2014 Cysto NED 01/2015 - Cysto / CTNED 02/2016 CT 02/2016 CT negative apart from 7 mm pulm nodule; normal cystoscopy   3- elevated PSA - pt PSA was 4.5 with Dr. Rosario Jacks Jan 2018. No h/o BPH. No FH PCa. Denies prior PSA elevation. DRE Jan 2018 showed a 50 + gram prostate with some subtle induration right base, but felt more benign today. Prostate measured 67 grams on Jan 2018 CT. Repeat PSA 05/2016 4.8.    4- Renal Insufficiency - solitary kidney s/p nephroureterectomy as per above. Cr post-op 2-2.5 range.    Blood pressure (!) 156/74, pulse 80, height 5\' 9"  (1.753 m), weight 200 lb (90.7 kg). NED. A&Ox3.   No respiratory distress   Abd soft, NT, ND Normal phallus with bilateral descended testicles  Cystoscopy Procedure Note  Patient identification  was confirmed, informed consent was obtained, and patient was prepped using Betadine solution.  Lidocaine jelly was administered per urethral meatus.    Preoperative abx where received prior to procedure.     Pre-Procedure: - Inspection reveals a normal caliber ureteral meatus.  Procedure: The flexible cystoscope was introduced without difficulty - No urethral strictures/lesions are present. - Enlarged prostate with trilobar coaptation - Normal bladder neck -Right UO with clear reflux of urine, left UO surgically absent - Bladder mucosa  reveals no ulcers, tumors, or lesions other than stellate scar and posterior bladder wall - No bladder stones - No trabeculation  Retroflexion shows unremarkable   Post-Procedure: - Patient tolerated the procedure well  Assessment/ Plan:  1. History of bladder cancer Extensive history of upper tract urothelial bladder cancer No evidence of recurrence today on cystoscopy - Urinalysis, Complete  2. Elevated PSA Not addressed today, patient was somewhat disgruntled and hesitant to proceed with cystoscopy and not interested in discussing any additional issues today Given his age and comorbidities as well as reassuring prostate exam, PSA is likely appropriate for his age Given his age and comorbidities, treatment of prostate cancer may be limited benefit Will check PSA/rectal exam next visit  Return in about 6 months (around 11/09/2017) for cysto.   [name], MD

## 2017-05-16 ENCOUNTER — Other Ambulatory Visit: Payer: Self-pay | Admitting: Oncology

## 2017-05-16 ENCOUNTER — Ambulatory Visit
Admission: RE | Admit: 2017-05-16 | Discharge: 2017-05-16 | Disposition: A | Discharge status: Home / Self Care | Payer: Medicare Other | Source: Ambulatory Visit | Attending: Oncology | Admitting: Oncology

## 2017-05-16 DIAGNOSIS — J432 Centrilobular emphysema: Secondary | ICD-10-CM | POA: Diagnosis not present

## 2017-05-16 DIAGNOSIS — K7689 Other specified diseases of liver: Secondary | ICD-10-CM | POA: Diagnosis not present

## 2017-05-16 DIAGNOSIS — I700 Atherosclerosis of aorta: Secondary | ICD-10-CM | POA: Diagnosis not present

## 2017-05-16 DIAGNOSIS — R911 Solitary pulmonary nodule: Secondary | ICD-10-CM

## 2017-05-16 LAB — POCT I-STAT CREATININE: Creatinine, Ser: 2.1 mg/dL — ABNORMAL HIGH (ref 0.61–1.24)

## 2017-05-16 NOTE — Progress Notes (Addendum)
[facility] Consultation      Assessment and Plan:  84 year old male with chronic cough and symptoms of chronic bronchitis and emphysema.  Emphysema/chronic bronchitis with chronic cough. -Improved with Symbicort. - Is instructed to continue Symbicort 2 puffs twice daily, rinse mouth after use.  Lung nodule. - We discussed that lung nodule on most recent CT chest is significantly increased in size. - Given the peripheral location of the nodule, CT-guided lung biopsy versus resection would be most ideal.  Patient currently has elevated creatinine, therefore we will hold off on PET scanning for now.  Orders Placed This Encounter  Procedures  . CT BIOPSY   Return in about 1 week (around 05/25/2017).  Addendum 05/21/2017; Discussed with radiology, recommend PET scan, for which the patient's renal function should not be affected.  If no other targets are found, interventional radiology agreeable to proceeding with needle biopsy.  Date: 05/16/2017  MRN# [id number] Jacob [name] 10-Sep-1928    Jacob [name] is a 84 y.o. old male seen in consultation for chief complaint of:    Chief Complaint  Patient presents with  . Emphysema    pt here for follow CT scan on 05/16/17.  Marland Kitchen Shortness of Breath    stable per patient  . Cough    pt has chronic bronchitis clear mucus; improved since starting Symbicort.    HPI:   He has been having a cough for about 5 months, he has received z pack and proair, he thinks the zpack may have helped for a few days. Yesterday he got a symbicort inhaler, he took 2 puffs last night, and felt much better.  He smoked 1 ppd until 15 years ago. He drives a car, he does not feel limited by his breathing. He lives by himself in his own house, he does not cook but only eats out. He does light cleaning and hires someone to clean the house. He hires someone to do the yardwork. He used to do it himself but he got bad allergies, particularly with  cleaning leaves in the fall.  He continues to have occasional cough, he is taking symbicort twice daily and feels that it is helping. The breathing is better, there is less sputum and the cough is also less.  He had a repeat CT chest ordered, and review of this CT, the lingular nodule appears to have significantly increased in size.   He does not think that he snores at night. He is occasionally takes a nap during the day.   Images personally reviewed; CT chest 05/16/2017 Ct chest 11/13/16;  Diffuse bilateral homogenous emphysema. 9 mm pleural based nodule in lingula larger than previous CT on 04/19/16. Other bilateral nodules. There is mild subcarinal and right hilar lymphadenopathy.   Social Hx:   Social History   Tobacco Use  . Smoking status: Former Smoker    Packs/day: 1.00    Years: 50.00    Pack years: 50.00    Types: Cigarettes    Last attempt to quit: 11/02/2000    Years since quitting: 16.5  . Smokeless tobacco: Never Used  Substance Use Topics  . Alcohol use: No  . Drug use: No   Medication:    Current Outpatient Medications:  .  acetaminophen (TYLENOL) 325 MG tablet, Take 650 mg by mouth every 4 (four) hours as needed., Disp: , Rfl:  .  aspirin 81 MG tablet, Take 81 mg by mouth daily., Disp: , Rfl:  .  azithromycin (ZITHROMAX)  250 MG tablet, Take 250 mg by mouth as directed., Disp: , Rfl:  .  budesonide-formoterol (SYMBICORT) 160-4.5 MCG/ACT inhaler, Inhale 2 puffs into the lungs 2 (two) times daily. Rinse mouth after use., Disp: 1 Inhaler, Rfl: 12 .  DEXILANT 60 MG capsule, , Disp: , Rfl:  .  furosemide (LASIX) 40 MG tablet, Take 20 mg by mouth daily as needed. , Disp: , Rfl:  .  isosorbide mononitrate (IMDUR) 30 MG 24 hr tablet, Take 30 mg by mouth at bedtime. , Disp: , Rfl:  .  latanoprost (XALATAN) 0.005 % ophthalmic solution, Place 1 drop into both eyes at bedtime. , Disp: , Rfl:  .  losartan (COZAAR) 50 MG tablet, Take 1 tablet by mouth every morning. , Disp: , Rfl:    .  lovastatin (MEVACOR) 20 MG tablet, Take 20 mg by mouth at bedtime. , Disp: , Rfl:  .  metoprolol tartrate (LOPRESSOR) 25 MG tablet, Take 12.5 mg by mouth every 12 (twelve) hours., Disp: , Rfl:  .  Multiple Vitamins-Minerals (MULTIVITAMINS THER. W/MINERALS) TABS, Take 1 tablet by mouth daily. , Disp: , Rfl:  .  Omega-3 Fatty Acids (FISH OIL) 1200 MG CPDR, Take by mouth., Disp: , Rfl:  .  predniSONE (DELTASONE) 10 MG tablet, Take 10 mg by mouth as directed., Disp: , Rfl:  .  PROAIR HFA 108 (90 Base) MCG/ACT inhaler, Inhale 1 puff into the lungs every 4 (four) hours as needed. , Disp: , Rfl:  .  senna-docusate (SENOKOT-S) 8.6-50 MG per tablet, Take 1 tablet by mouth 2 (two) times daily. While taking pain meds to prevent constipation, Disp: 30 tablet, Rfl: 0   Allergies:  Penicillins  Review of Systems: Gen:  Denies  fever, sweats, chills HEENT: Denies blurred vision, double vision. bleeds, sore throat Cvc:  No dizziness, chest pain. Resp:   Denies cough or sputum production, shortness of breath Gi: Denies swallowing difficulty, stomach pain. Gu:  Denies bladder incontinence, burning urine Ext:   No Joint pain, stiffness. Skin: No skin rash,  hives  Endoc:  No polyuria, polydipsia. Psych: No depression, insomnia. Other:  All other systems were reviewed with the patient and were negative other that what is mentioned in the HPI.   Physical Examination:   VS: BP 122/82 (BP Location: Left Arm, Cuff Size: Large)   Pulse 85   Resp 16   Ht 5\' 9"  (1.753 m)   Wt 199 lb (90.3 kg)   SpO2 100%   BMI 29.39 kg/m   General Appearance: No distress  Neuro:without focal findings,  speech normal,  HEENT: PERRLA, EOM intact.   Pulmonary: normal breath sounds, No wheezing.  CardiovascularNormal S1,S2.  No m/r/g.   Abdomen: Benign, Soft, non-tender. Renal:  No costovertebral tenderness  GU:  No performed at this time. Endoc: No evident thyromegaly, no signs of acromegaly. Skin:   warm, no  rashes, no ecchymosis  Extremities: normal, no cyanosis, clubbing.  Other findings:    LABORATORY PANEL:   CBC No results for input(s): WBC, HGB, HCT, PLT in the last 168 hours. ------------------------------------------------------------------------------------------------------------------  Chemistries  Recent Labs  Lab 05/16/17 1308  CREATININE 2.10*   ------------------------------------------------------------------------------------------------------------------  Cardiac Enzymes No results for input(s): TROPONINI in the last 168 hours. ------------------------------------------------------------  RADIOLOGY:  No results found.     Thank  you for the consultation and for allowing [facility] to assist in the care of your patient. Our recommendations are noted above.  Please contact us if  we can be of further service.   Marda Stalker, MD.  Board Certified in Internal Medicine, Pulmonary Medicine, [place], and Sleep Medicine.  [facility] Office Number: [phone]  Patricia Pesa, M.D.  Merton Border, M.D  05/16/2017

## 2017-05-18 ENCOUNTER — Encounter: Payer: Self-pay | Admitting: Internal Medicine

## 2017-05-18 ENCOUNTER — Ambulatory Visit (INDEPENDENT_AMBULATORY_CARE_PROVIDER_SITE_OTHER): Payer: Medicare Other | Admitting: Internal Medicine

## 2017-05-18 VITALS — BP 122/82 | HR 85 | Resp 16 | Ht 69.0 in | Wt 199.0 lb

## 2017-05-18 DIAGNOSIS — J431 Panlobular emphysema: Secondary | ICD-10-CM | POA: Diagnosis not present

## 2017-05-18 DIAGNOSIS — R911 Solitary pulmonary nodule: Secondary | ICD-10-CM

## 2017-05-18 DIAGNOSIS — R918 Other nonspecific abnormal finding of lung field: Secondary | ICD-10-CM | POA: Diagnosis not present

## 2017-05-18 NOTE — Patient Instructions (Addendum)
Continue symbicort.  Will have you see the radiologist for a lung biopsy.  Follow up with [place] 1 week after biopsy.

## 2017-05-20 NOTE — Progress Notes (Signed)
[place]  [phone] Fax:(336) [phone]  ID: Jacob [name] OB: Sep 25, 1928  MR#: [id number]  UTM#:[id number]  Patient Care Team: [name], MD as PCP - General (Internal Medicine)  CHIEF COMPLAINT: Nodule of the left lung.  INTERVAL HISTORY: Patient returns to clinic today for further evaluation and discussion of his imaging results.  He currently feels well and is asymptomatic.  He has no neurologic complaints. He denies any recent fevers or illnesses. He has a good appetite and denies weight loss. He denies any chest pain, shortness of breath, cough, or hemoptysis. He has no nausea, vomiting, constipation, or diarrhea. He has no new urinary complaints.  Patient offers no specific complaints today.  REVIEW OF SYSTEMS:   Review of Systems  Constitutional: Negative.  Negative for fever, malaise/fatigue and weight loss.  Respiratory: Negative.  Negative for cough, hemoptysis and shortness of breath.   Cardiovascular: Negative.  Negative for chest pain and leg swelling.  Gastrointestinal: Negative.  Negative for abdominal pain.  Genitourinary: Negative.  Negative for dysuria.  Musculoskeletal: Negative.  Negative for back pain.  Skin: Negative.  Negative for rash.  Neurological: Negative.  Negative for sensory change, focal weakness and weakness.  Psychiatric/Behavioral: Negative.  The patient is not nervous/anxious.     As per HPI. Otherwise, a complete review of systems is negative.  PAST MEDICAL HISTORY: Past Medical History:  Diagnosis Date  . Arthritis    hands  . At risk for sleep apnea    STOP-BANG=  4     SENT TO PCP [id number]  . BPH (benign prostatic hypertrophy)   . Coronary artery disease cardiologist-- dr Fletcher Anon  and dr gregg taylor   a. 2006 s/p PCI/Stenting x 2 to the RCA;  b. 05/03/12 Cardiac CT: Patent RCA stent, nonobs dzs;  c. 01/2013 Ex MV: nl EF, no ischemia.  Marland Kitchen GERD (gastroesophageal reflux disease)   . Glaucoma of both  eyes   . H/O hiatal hernia   . History of atrial fibrillation    a. 2006.  Marland Kitchen History of diverticulitis   . History of kidney stones   . History of renal cell carcinoma    08-22-2013  s/p  left nephrouretectomy  . Hyperlipidemia   . Hyperlipidemia   . Hypertension   . LBBB (left bundle branch block)   . Mobitz type 2 second degree AV block   . Recurrent bladder transitional cell carcinoma ([place]) monitored by dr [name]   s/p turb's  and chemo bladder instillations  . Renal insufficiency    a. Creat rose to 2.34 post-op L nephrectomy.  . S/P placement of cardiac pacemaker MEDTRONIC last pacer check 12-01-2013   08-29-2013  for symptomatic bradycardia and mobitz 2 second degree heart block  . Symptomatic bradycardia    s/p  pacemaker placement 08-29-2013    PAST SURGICAL HISTORY: Past Surgical History:  Procedure Laterality Date  . APPENDECTOMY  02/1988  . CATARACT EXTRACTION W/ INTRAOCULAR LENS  IMPLANT, BILATERAL  2011  . CORONARY ANGIOPLASTY WITH STENT PLACEMENT  2006    [facility]   X2 STENTS  TO RCA  . CYSTOSCOPY W/ RETROGRADES Right 01/13/2014   Procedure: CYSTOSCOPY WITH RETROGRADE PYELOGRAM;  Surgeon: [name], MD;  Location: [facility];  Service: Urology;  Laterality: Right;  . CYSTOSCOPY W/ URETERAL STENT PLACEMENT Left 08/22/2013   Procedure: CYSTOSCOPY WITH RETROGRADE PYELOGRAM/URETERAL STENT PLACEMENT;  Surgeon: [name], MD;  Location: [facility];  Service: Urology;  Laterality: Left;  .  CYSTOSCOPY WITH BIOPSY  04/13/2011     Procedure: CYSTOSCOPY WITH BIOPSY;  Surgeon: [name], MD;  Location: [facility];  Service: Urology;  Laterality: N/A;  Cold Cup Biopsy  . CYSTOSCOPY WITH RETROGRADE PYELOGRAM, URETEROSCOPY AND STENT PLACEMENT Left 06/19/2013   Procedure: CYSTOSCOPY WITH LEFT RETROGRADE PYELOGRAM, LEFT URETEROSCOPY, ureteral balloon dilatation, BIOPSY LEFT KIDNEY;  Surgeon: [name], MD;  Location: [facility];   Service: Urology;  Laterality: Left;  . INGUINAL HERNIA REPAIR Left 08/22/2013   Procedure: LAPAROSCOPIC INGUINAL HERNIA;  Surgeon: [name], MD;  Location: [facility];  Service: Urology;  Laterality: Left;  . PERMANENT PACEMAKER INSERTION N/A 08/29/2013   Procedure: PERMANENT PACEMAKER INSERTION;  Surgeon: [name], MD;  Location: [facility] CATH LAB;  Service: Cardiovascular;  Laterality: N/A;  Medtronic dual-chamber  . ROBOT ASSITED LAPAROSCOPIC NEPHROURETERECTOMY Left 08/22/2013   Procedure: ROBOT ASSITED LAPAROSCOPIC NEPHROURETERECTOMY;  Surgeon: [name], MD;  Location: [facility];  Service: Urology;  Laterality: Left;  . TRANSTHORACIC ECHOCARDIOGRAM  08-29-2013   mild LVH/  ef 60-65% /  grade I diastolic dysfunction/  mild MR/  mild LAE  and RAE/  moderate TR  . TRANSURETHRAL RESECTION OF BLADDER TUMOR  12-22-2010  . TRANSURETHRAL RESECTION OF BLADDER TUMOR  08/04/2011   Procedure: TRANSURETHRAL RESECTION OF BLADDER TUMOR (TURBT);  Surgeon: [name], MD;  Location: [facility];  Service: Urology;  Laterality: N/A;  . TRANSURETHRAL RESECTION OF BLADDER TUMOR N/A 01/13/2014   Procedure: TRANSURETHRAL RESECTION OF BLADDER TUMOR (TURBT);  Surgeon: [name], MD;  Location: [facility];  Service: Urology;  Laterality: N/A;    FAMILY HISTORY: Family History  Problem Relation Age of Onset  . Heart attack Mother   . Heart attack Father   . Prostate cancer Neg Hx   . Bladder Cancer Neg Hx   . Kidney cancer Neg Hx     ADVANCED DIRECTIVES (Y/N):  N  HEALTH MAINTENANCE: Social History   Tobacco Use  . Smoking status: Former Smoker    Packs/day: 1.00    Years: 50.00    Pack years: 50.00    Types: Cigarettes    Last attempt to quit: 11/02/2000    Years since quitting: 16.5  . Smokeless tobacco: Never Used  Substance Use Topics  . Alcohol use: No  . Drug use: No     Colonoscopy:  PAP:  Bone density:  Lipid panel:  Allergies    Allergen Reactions  . Penicillins Hives    BLISTERS    Current Outpatient Medications  Medication Sig Dispense Refill  . aspirin 81 MG tablet Take 81 mg by mouth daily.    . budesonide-formoterol (SYMBICORT) 160-4.5 MCG/ACT inhaler Inhale 2 puffs into the lungs 2 (two) times daily. Rinse mouth after use. 1 Inhaler 12  . DEXILANT 60 MG capsule     . isosorbide mononitrate (IMDUR) 30 MG 24 hr tablet Take 30 mg by mouth at bedtime.     Marland Kitchen latanoprost (XALATAN) 0.005 % ophthalmic solution Place 1 drop into both eyes at bedtime.     Marland Kitchen losartan (COZAAR) 50 MG tablet Take 1 tablet by mouth every morning.     . lovastatin (MEVACOR) 20 MG tablet Take 20 mg by mouth at bedtime.     . metoprolol tartrate (LOPRESSOR) 25 MG tablet Take 12.5 mg by mouth every 12 (twelve) hours.    . Multiple Vitamins-Minerals (MULTIVITAMINS THER. W/MINERALS) TABS Take 1 tablet by mouth daily.     Marland Kitchen  Omega-3 Fatty Acids (FISH OIL) 1200 MG CPDR Take by mouth.    Marland Kitchen PROAIR HFA 108 (90 Base) MCG/ACT inhaler Inhale 1 puff into the lungs every 4 (four) hours as needed.     . senna-docusate (SENOKOT-S) 8.6-50 MG per tablet Take 1 tablet by mouth 2 (two) times daily. While taking pain meds to prevent constipation 30 tablet 0  . acetaminophen (TYLENOL) 325 MG tablet Take 650 mg by mouth every 4 (four) hours as needed.    . furosemide (LASIX) 40 MG tablet Take 20 mg by mouth daily as needed.      No current facility-administered medications for this visit.     OBJECTIVE: Vitals:   05/22/17 1420  BP: (!) 161/73  Pulse: 88  Resp: 20  Temp: (!) 96.2 F (35.7 C)     Body mass index is 29.99 kg/m.    ECOG FS:0 - Asymptomatic  General: Well-developed, well-nourished, no acute distress. Eyes: Pink conjunctiva, anicteric sclera. Lungs: Clear to auscultation bilaterally. Heart: Regular rate and rhythm. No rubs, murmurs, or gallops. Abdomen: Soft, nontender, nondistended. No organomegaly noted, normoactive bowel  sounds. Musculoskeletal: No edema, cyanosis, or clubbing. Neuro: Alert, answering all questions appropriately. Cranial nerves grossly intact. Skin: No rashes or petechiae noted. Psych: Normal affect.  LAB RESULTS:  Lab Results  Component Value Date   NA 141 03/27/2015   K 4.4 03/27/2015   CL 106 03/27/2015   CO2 31 03/27/2015   GLUCOSE 146 (H) 03/27/2015   BUN 30 (H) 03/27/2015   CREATININE 2.10 (H) 05/16/2017   CALCIUM 8.4 (L) 03/27/2015   PROT 7.6 07/07/2011   ALBUMIN 3.6 07/07/2011   AST 23 07/07/2011   ALT 23 07/07/2011   ALKPHOS 69 07/07/2011   BILITOT 0.5 07/07/2011   GFRNONAA 32 (L) 03/27/2015   GFRAA 38 (L) 03/27/2015    Lab Results  Component Value Date   WBC 7.1 03/27/2015   NEUTROABS 5.1 03/27/2015   HGB 12.0 (L) 03/27/2015   HCT 36.9 (L) 03/27/2015   MCV 93.2 03/27/2015   PLT 237 03/27/2015     STUDIES: Ct Chest Wo Contrast  Result Date: 05/17/2017 CLINICAL DATA:  Follow-up pulmonary nodules. History of left nephroureterectomy 08/22/2013 for urothelial carcinoma of the left renal pelvis. History of TURBT for bladder carcinoma 01/13/2014. former smoker. EXAM: CT CHEST WITHOUT CONTRAST TECHNIQUE: Multidetector CT imaging of the chest was performed following the standard protocol without IV contrast. COMPARISON:  11/13/2016 chest CT. FINDINGS: Cardiovascular: Normal heart size. No significant pericardial effusion/thickening. Left anterior descending, left circumflex and right coronary atherosclerosis. Stable configuration of 2 lead left subclavian pacemaker with lead tips in the right atrium and right ventricular apex. Atherosclerotic nonaneurysmal thoracic aorta. Stable top-normal caliber main pulmonary artery (3.2 cm diameter). Mediastinum/Nodes: No discrete thyroid nodules. Unremarkable esophagus. No axillary adenopathy. Mildly enlarged 1.0 cm subcarinal node (series 2/image 76), stable using similar measurement technique. No new pathologically enlarged  mediastinal nodes. No discrete hilar adenopathy on these noncontrast images. Lungs/Pleura: No pneumothorax. No pleural effusion. Moderate centrilobular emphysema with mild diffuse bronchial wall thickening. No acute consolidative airspace disease or lung masses. Dominant irregular solid 1.8 x 1.4 cm lingular pulmonary nodule abutting the peripheral pleural surface (series 3/image 106), significantly increased from 0.9 x 0.8 cm. A few additional scattered small solid pulmonary nodules in both lungs measuring up to 6 mm in the right middle lobe (series 3/image 105) are unchanged. No new significant pulmonary nodules. Upper abdomen: There is a 3.2 x 2.1 cm segment  4A left liver lobe mass anteriorly (series 2/image 138) with near isodensity to the liver parenchyma, stable in size since 01/06/2014 CT abdomen study, where the density of this lesion was simple fluid. Left nephrectomy. Colonic diverticulosis. Musculoskeletal: No aggressive appearing focal osseous lesions. Marked thoracic spondylosis. IMPRESSION: 1. Continued growth of irregular solid 1.8 cm peripheral lingular pulmonary nodule, most compatible with a malignant nodule, either primary bronchogenic carcinoma or metastasis. Consider PET-CT and/or tissue sample for further characterization. 2. Additional scattered subcentimeter pulmonary nodules in both lungs are all stable. No new significant pulmonary nodules. 3. Mild subcarinal lymphadenopathy, nonspecific, stable, more likely reactive. No new or progressive thoracic adenopathy. 4. Moderate centrilobular emphysema with mild diffuse bronchial wall thickening, suggesting COPD. 5. Segment 4A left liver lobe mass, isodense to the liver, stable in size since 2016 CT abdomen study, where it was seen to represent a simple liver cyst. Findings are suggestive of a hemorrhagic/proteinaceous liver cyst. Aortic Atherosclerosis (ICD10-I70.0) and Emphysema (ICD10-J43.9). Electronically Signed   By: Ilona Sorrel M.D.   On:  05/17/2017 10:26    ASSESSMENT: Nodule of the left lung.  PLAN:    1. Nodule of the left lung: CT scan results from May 17, 2017 reviewed independently and report as above with continued progression and enlargement of patient's known subpleural lingular nodule.  Case was discussed at cancer conference as well as with pulmonology.  He has a PET scan scheduled for tomorrow and he has been instructed to keep this appointment.  Radiation oncology has agreed that no biopsy would be necessary for an enlarging lesion that is PET positive.  Patient previously was hesitant to undergo treatment, but has agreed to radiation treatment.  He continues to refuse systemic treatment.  Referral was given to radiation oncology for evaluation next week.  Patient will return to clinic approximately 3 months after his treatment for further evaluation.   Approximately 30 minutes spent in discussion and planning of which greater than 50% was consultation.  Patient expressed understanding and was in agreement with this plan. He also understands that He can call clinic at any time with any questions, concerns, or complaints.   Cancer Staging No matching staging information was found for the patient.  Lloyd Huger, MD   05/25/2017 1:43 PM

## 2017-05-21 ENCOUNTER — Other Ambulatory Visit: Payer: Self-pay | Admitting: Internal Medicine

## 2017-05-21 DIAGNOSIS — R911 Solitary pulmonary nodule: Secondary | ICD-10-CM

## 2017-05-21 NOTE — Addendum Note (Signed)
Addended by: Laverle Hobby on: 05/21/2017 03:18 PM   Modules accepted: Orders

## 2017-05-21 NOTE — Progress Notes (Signed)
  Initial order incorrect PET skull base to thigh changed.

## 2017-05-22 ENCOUNTER — Inpatient Hospital Stay: Payer: Medicare Other | Attending: Oncology | Admitting: Oncology

## 2017-05-22 ENCOUNTER — Encounter: Payer: Self-pay | Admitting: Oncology

## 2017-05-22 ENCOUNTER — Other Ambulatory Visit: Payer: Self-pay

## 2017-05-22 VITALS — BP 161/73 | HR 88 | Temp 96.2°F | Resp 20 | Wt 203.1 lb

## 2017-05-22 DIAGNOSIS — Z87891 Personal history of nicotine dependence: Secondary | ICD-10-CM | POA: Diagnosis not present

## 2017-05-22 DIAGNOSIS — R911 Solitary pulmonary nodule: Secondary | ICD-10-CM | POA: Diagnosis not present

## 2017-05-22 DIAGNOSIS — Z8551 Personal history of malignant neoplasm of bladder: Secondary | ICD-10-CM | POA: Diagnosis not present

## 2017-05-22 DIAGNOSIS — Z7982 Long term (current) use of aspirin: Secondary | ICD-10-CM | POA: Insufficient documentation

## 2017-05-22 NOTE — Progress Notes (Signed)
Here for follow up. Stated feeling stressed out " being here " stated x few days feeling " a little vertigo"  Has had before-encouraged to f/u w PCP

## 2017-05-23 ENCOUNTER — Other Ambulatory Visit: Payer: Self-pay | Admitting: Internal Medicine

## 2017-05-24 ENCOUNTER — Telehealth: Payer: Self-pay | Admitting: *Deleted

## 2017-05-24 NOTE — Telephone Encounter (Signed)
Pt discussed at case conference today and recommendation was for patient to keep appointment for PET scan on Friday 5/24 and follow up with Dr. Baruch Gouty next week to discuss radiation treatment. Pt notified of recommendations from conference and did not have any further questions.

## 2017-05-25 ENCOUNTER — Encounter
Admission: RE | Admit: 2017-05-25 | Discharge: 2017-05-25 | Disposition: A | Discharge status: Home / Self Care | Payer: Medicare Other | Source: Ambulatory Visit | Attending: Internal Medicine | Admitting: Internal Medicine

## 2017-05-25 DIAGNOSIS — R911 Solitary pulmonary nodule: Secondary | ICD-10-CM | POA: Insufficient documentation

## 2017-05-25 LAB — CUP PACEART REMOTE DEVICE CHECK
Battery Impedance: 229 Ohm
Brady Statistic AP VP Percent: 76 %
Brady Statistic AP VS Percent: 0 %
Brady Statistic AS VS Percent: 0 %
Date Time Interrogation Session: 20190507121924
Implantable Lead Implant Date: 20150903
Implantable Lead Implant Date: 20150903
Implantable Lead Model: 5076
Implantable Lead Model: 5076
Lead Channel Impedance Value: 478 Ohm
Lead Channel Impedance Value: 521 Ohm
Lead Channel Pacing Threshold Amplitude: 0.75 V
Lead Channel Pacing Threshold Pulse Width: 0.4 ms
Lead Channel Setting Pacing Amplitude: 2.5 V
MDC IDC LEAD LOCATION: 753859
MDC IDC LEAD LOCATION: 753860
MDC IDC MSMT BATTERY REMAINING LONGEVITY: 103 mo
MDC IDC MSMT BATTERY VOLTAGE: 2.79 V
MDC IDC MSMT LEADCHNL RA PACING THRESHOLD AMPLITUDE: 0.875 V
MDC IDC MSMT LEADCHNL RV PACING THRESHOLD PULSEWIDTH: 0.4 ms
MDC IDC PG IMPLANT DT: 20150903
MDC IDC SET LEADCHNL RA PACING AMPLITUDE: 2 V
MDC IDC SET LEADCHNL RV PACING PULSEWIDTH: 0.4 ms
MDC IDC SET LEADCHNL RV SENSING SENSITIVITY: 2.8 mV
MDC IDC STAT BRADY AS VP PERCENT: 23 %

## 2017-05-25 LAB — GLUCOSE, CAPILLARY: Glucose-Capillary: 104 mg/dL — ABNORMAL HIGH (ref 65–99)

## 2017-05-25 MED ORDER — FLUDEOXYGLUCOSE F - 18 (FDG) INJECTION
10.5000 | Freq: Once | INTRAVENOUS | Status: AC | PRN
  Administered 2017-05-25: 10 via INTRAVENOUS

## 2017-05-29 ENCOUNTER — Other Ambulatory Visit: Payer: Self-pay

## 2017-05-29 ENCOUNTER — Ambulatory Visit
Admission: RE | Admit: 2017-05-29 | Discharge: 2017-05-29 | Disposition: A | Discharge status: Home / Self Care | Payer: Medicare Other | Source: Ambulatory Visit | Attending: Radiation Oncology | Admitting: Radiation Oncology

## 2017-05-29 ENCOUNTER — Encounter: Payer: Self-pay | Admitting: Radiation Oncology

## 2017-05-29 VITALS — BP 157/80 | HR 86 | Temp 97.1°F | Resp 21 | Ht 69.0 in | Wt 200.9 lb

## 2017-05-29 DIAGNOSIS — Z7982 Long term (current) use of aspirin: Secondary | ICD-10-CM | POA: Diagnosis not present

## 2017-05-29 DIAGNOSIS — R911 Solitary pulmonary nodule: Secondary | ICD-10-CM | POA: Diagnosis not present

## 2017-05-29 DIAGNOSIS — R972 Elevated prostate specific antigen [PSA]: Secondary | ICD-10-CM | POA: Insufficient documentation

## 2017-05-29 DIAGNOSIS — Z79899 Other long term (current) drug therapy: Secondary | ICD-10-CM | POA: Insufficient documentation

## 2017-05-29 DIAGNOSIS — I2510 Atherosclerotic heart disease of native coronary artery without angina pectoris: Secondary | ICD-10-CM | POA: Diagnosis not present

## 2017-05-29 DIAGNOSIS — I4891 Unspecified atrial fibrillation: Secondary | ICD-10-CM | POA: Diagnosis not present

## 2017-05-29 DIAGNOSIS — Z8551 Personal history of malignant neoplasm of bladder: Secondary | ICD-10-CM | POA: Diagnosis not present

## 2017-05-29 DIAGNOSIS — R001 Bradycardia, unspecified: Secondary | ICD-10-CM | POA: Diagnosis not present

## 2017-05-29 DIAGNOSIS — E785 Hyperlipidemia, unspecified: Secondary | ICD-10-CM | POA: Insufficient documentation

## 2017-05-29 DIAGNOSIS — I447 Left bundle-branch block, unspecified: Secondary | ICD-10-CM | POA: Diagnosis not present

## 2017-05-29 DIAGNOSIS — Z87891 Personal history of nicotine dependence: Secondary | ICD-10-CM | POA: Insufficient documentation

## 2017-05-29 DIAGNOSIS — Z8719 Personal history of other diseases of the digestive system: Secondary | ICD-10-CM | POA: Diagnosis not present

## 2017-05-29 DIAGNOSIS — K219 Gastro-esophageal reflux disease without esophagitis: Secondary | ICD-10-CM | POA: Diagnosis not present

## 2017-05-29 DIAGNOSIS — I10 Essential (primary) hypertension: Secondary | ICD-10-CM | POA: Diagnosis not present

## 2017-05-29 DIAGNOSIS — Z87442 Personal history of urinary calculi: Secondary | ICD-10-CM | POA: Insufficient documentation

## 2017-05-29 DIAGNOSIS — J439 Emphysema, unspecified: Secondary | ICD-10-CM | POA: Diagnosis not present

## 2017-05-29 DIAGNOSIS — N400 Enlarged prostate without lower urinary tract symptoms: Secondary | ICD-10-CM | POA: Insufficient documentation

## 2017-05-29 DIAGNOSIS — M129 Arthropathy, unspecified: Secondary | ICD-10-CM | POA: Diagnosis not present

## 2017-05-29 NOTE — Consult Note (Signed)
NEW PATIENT EVALUATION  Name: Jacob [name]  MRN: [id number]  Date:   05/29/2017     DOB: 1928-03-05   This 84 y.o. male patient presents to the clinic for initial evaluation of a stage I non-small cell lung cancer of the left lung.  REFERRING PHYSICIAN: [name], MD  CHIEF COMPLAINT:  Chief Complaint  Patient presents with  . Lung Mass    Initial Eval    DIAGNOSIS: The encounter diagnosis was Nodule of left lung.   PREVIOUS INVESTIGATIONS:  PET scan and CT scans reviewed Clinical notes reviewed Pathology reports reviewed  HPI: patient is an 84 year old malewith a history of left upper tract urothelial carcinoma as well as bladder cancer who is been followed by urology. He has been having CT scans for observation and back in February 2018 was noted to have a 7 mm pulmonary nodule left lung.he most recent had a repeat cystoscopy in May showing no evidence of urothelial carcinoma.on serial CT scans his lesion has continued to grow is now up to 1.8 cm back 2 weeks prior consistent with primary bronchogenic carcinoma. Patient does have central lobar emphysema with diffuse bronchial wall thickening suggestive of COPD.patient had a PET CT on May 24 showing moderate metabolic activity associated with the lingular nodule concerning for bronchogenic carcinoma. There was no evidence of mediastinal adenopathy. He did have focal activity in the right lateral mid prostate gland does have a PSA over 4 although that is being followed.He is without complaint T specifically denies cough hemoptysis or chest tightness. He was presented at our weekly tumor conference and tumor boards decision was to proceed with SB RT to this lesion.  PLANNED TREATMENT REGIMEN: SB RT to left lung  PAST MEDICAL HISTORY:  has a past medical history of Arthritis, At risk for sleep apnea, BPH (benign prostatic hypertrophy), Coronary artery disease (cardiologist-- dr Fletcher Anon  and dr gregg taylor), GERD (gastroesophageal  reflux disease), Glaucoma of both eyes, H/O hiatal hernia, History of atrial fibrillation, History of diverticulitis, History of kidney stones, History of renal cell carcinoma, Hyperlipidemia, Hyperlipidemia, Hypertension, LBBB (left bundle branch block), Mobitz type 2 second degree AV block, Recurrent bladder transitional cell carcinoma ([place]) (monitored by dr [name]), Renal insufficiency, S/P placement of cardiac pacemaker ([employer]TRONIC last pacer check 12-01-2013), and Symptomatic bradycardia.    PAST SURGICAL HISTORY:  Past Surgical History:  Procedure Laterality Date  . APPENDECTOMY  02/1988  . CATARACT EXTRACTION W/ INTRAOCULAR LENS  IMPLANT, BILATERAL  2011  . CORONARY ANGIOPLASTY WITH STENT PLACEMENT  2006    [facility]   X2 STENTS  TO RCA  . CYSTOSCOPY W/ RETROGRADES Right 01/13/2014   Procedure: CYSTOSCOPY WITH RETROGRADE PYELOGRAM;  Surgeon: [name], MD;  Location: [facility];  Service: Urology;  Laterality: Right;  . CYSTOSCOPY W/ URETERAL STENT PLACEMENT Left 08/22/2013   Procedure: CYSTOSCOPY WITH RETROGRADE PYELOGRAM/URETERAL STENT PLACEMENT;  Surgeon: [name], MD;  Location: [facility];  Service: Urology;  Laterality: Left;  . CYSTOSCOPY WITH BIOPSY  04/13/2011     Procedure: CYSTOSCOPY WITH BIOPSY;  Surgeon: [name], MD;  Location: [facility];  Service: Urology;  Laterality: N/A;  Cold Cup Biopsy  . CYSTOSCOPY WITH RETROGRADE PYELOGRAM, URETEROSCOPY AND STENT PLACEMENT Left 06/19/2013   Procedure: CYSTOSCOPY WITH LEFT RETROGRADE PYELOGRAM, LEFT URETEROSCOPY, ureteral balloon dilatation, BIOPSY LEFT KIDNEY;  Surgeon: [name], MD;  Location: [facility];  Service: Urology;  Laterality: Left;  . INGUINAL HERNIA REPAIR Left 08/22/2013  Procedure: LAPAROSCOPIC INGUINAL HERNIA;  Surgeon: [name], MD;  Location: [facility];  Service: Urology;  Laterality: Left;  . PERMANENT PACEMAKER INSERTION N/A 08/29/2013   Procedure:  PERMANENT PACEMAKER INSERTION;  Surgeon: [name], MD;  Location: [facility] LAB;  Service: Cardiovascular;  Laterality: N/A;  Medtronic dual-chamber  . ROBOT ASSITED LAPAROSCOPIC NEPHROURETERECTOMY Left 08/22/2013   Procedure: ROBOT ASSITED LAPAROSCOPIC NEPHROURETERECTOMY;  Surgeon: [name], MD;  Location: [facility];  Service: Urology;  Laterality: Left;  . TRANSTHORACIC ECHOCARDIOGRAM  08-29-2013   mild LVH/  ef 60-65% /  grade I diastolic dysfunction/  mild MR/  mild LAE  and RAE/  moderate TR  . TRANSURETHRAL RESECTION OF BLADDER TUMOR  12-22-2010  . TRANSURETHRAL RESECTION OF BLADDER TUMOR  08/04/2011   Procedure: TRANSURETHRAL RESECTION OF BLADDER TUMOR (TURBT);  Surgeon: [name], MD;  Location: [facility];  Service: Urology;  Laterality: N/A;  . TRANSURETHRAL RESECTION OF BLADDER TUMOR N/A 01/13/2014   Procedure: TRANSURETHRAL RESECTION OF BLADDER TUMOR (TURBT);  Surgeon: [name], MD;  Location: [facility];  Service: Urology;  Laterality: N/A;    FAMILY HISTORY: family history includes Heart attack in his father and mother.  SOCIAL HISTORY:  reports that he quit smoking about 16 years ago. His smoking use included cigarettes. He has a 50.00 pack-year smoking history. He has never used smokeless tobacco. He reports that he does not drink alcohol or use drugs.  ALLERGIES: Penicillins  MEDICATIONS:  Current Outpatient Medications  Medication Sig Dispense Refill  . acetaminophen (TYLENOL) 325 MG tablet Take 650 mg by mouth every 4 (four) hours as needed.    Marland Kitchen aspirin 81 MG tablet Take 81 mg by mouth daily.    . budesonide-formoterol (SYMBICORT) 160-4.5 MCG/ACT inhaler Inhale 2 puffs into the lungs 2 (two) times daily. Rinse mouth after use. 1 Inhaler 12  . cetirizine (ZYRTEC) 10 MG tablet Take 10 mg by mouth daily.    Marland Kitchen DEXILANT 60 MG capsule     . furosemide (LASIX) 40 MG tablet Take 20 mg by mouth daily as needed.     . isosorbide  mononitrate (IMDUR) 30 MG 24 hr tablet Take 30 mg by mouth at bedtime.     Marland Kitchen latanoprost (XALATAN) 0.005 % ophthalmic solution Place 1 drop into both eyes at bedtime.     Marland Kitchen losartan (COZAAR) 50 MG tablet Take 1 tablet by mouth every morning.     . lovastatin (MEVACOR) 20 MG tablet Take 20 mg by mouth at bedtime.     . metoprolol tartrate (LOPRESSOR) 25 MG tablet Take 12.5 mg by mouth every 12 (twelve) hours.    . Multiple Vitamins-Minerals (MULTIVITAMINS THER. W/MINERALS) TABS Take 1 tablet by mouth daily.     . Omega-3 Fatty Acids (FISH OIL) 1200 MG CPDR Take by mouth.    Marland Kitchen PROAIR HFA 108 (90 Base) MCG/ACT inhaler Inhale 1 puff into the lungs every 4 (four) hours as needed.     . senna-docusate (SENOKOT-S) 8.6-50 MG per tablet Take 1 tablet by mouth 2 (two) times daily. While taking pain meds to prevent constipation 30 tablet 0   No current facility-administered medications for this encounter.     ECOG PERFORMANCE STATUS:  0 - Asymptomatic  REVIEW OF SYSTEMS:  Patient denies any weight loss, fatigue, weakness, fever, chills or night sweats. Patient denies any loss of vision, blurred vision. Patient denies any ringing  of the ears or hearing loss. No irregular heartbeat.  Patient denies heart murmur or history of fainting. Patient denies any chest pain or pain radiating to her upper extremities. Patient denies any shortness of breath, difficulty breathing at night, cough or hemoptysis. Patient denies any swelling in the lower legs. Patient denies any nausea vomiting, vomiting of blood, or coffee ground material in the vomitus. Patient denies any stomach pain. Patient states has had normal bowel movements no significant constipation or diarrhea. Patient denies any dysuria, hematuria or significant nocturia. Patient denies any problems walking, swelling in the joints or loss of balance. Patient denies any skin changes, loss of hair or loss of weight. Patient denies any excessive worrying or anxiety or  significant depression. Patient denies any problems with insomnia. Patient denies excessive thirst, polyuria, polydipsia. Patient denies any swollen glands, patient denies easy bruising or easy bleeding. Patient denies any recent infections, allergies or URI. Patient "s visual fields have not changed significantly in recent time.    PHYSICAL EXAM: BP (!) 157/80   Pulse 86   Temp (!) 97.1 F (36.2 C)   Resp (!) 21   Ht 5\' 9"  (1.753 m)   Wt 200 lb 15.2 oz (91.2 kg)   BMI 29.68 kg/m  Well-developed well-nourished patient in NAD. HEENT reveals PERLA, EOMI, discs not visualized.  Oral cavity is clear. No oral mucosal lesions are identified. Neck is clear without evidence of cervical or supraclavicular adenopathy. Lungs are clear to A&P. Cardiac examination is essentially unremarkable with regular rate and rhythm without murmur rub or thrill. Abdomen is benign with no organomegaly or masses noted. Motor sensory and DTR levels are equal and symmetric in the upper and lower extremities. Cranial nerves II through XII are grossly intact. Proprioception is intact. No peripheral adenopathy or edema is identified. No motor or sensory levels are noted. Crude visual fields are within normal range.  LABORATORY DATA: no pathology report for review    RADIOLOGY RESULTS:CT scans and PET/CT scan reviewed and compatible with the above-stated findings   IMPRESSION: stage I non-small cell lung cancer of the left lung lingula in 84 year old male  PLAN: at this time based on tumor board recommendations I have recommended SB RT to his left lung. Would plan on delivering 6000 cGy in 5 fractions. Would use4D study as well as breathing restriction protocol for CT simulation. Risks and benefits of treatment including possible develop of cough fatigue chest wall pain all were discussed in detail with the patient. He comprehends my treatment plan well. I personally set up and ordered CT simulation for next week. We will also  continue to follow his PSA as he does probably have prostate cancer based on his PSA and PET/CT findings. Patient is aware and comprehend my treatment plan well.  I would like to take this opportunity to thank you for allowing me to participate in the care of your patient.Noreene Filbert, MD

## 2017-06-01 ENCOUNTER — Other Ambulatory Visit: Payer: Self-pay | Admitting: *Deleted

## 2017-06-01 DIAGNOSIS — R911 Solitary pulmonary nodule: Secondary | ICD-10-CM

## 2017-06-05 ENCOUNTER — Ambulatory Visit: Payer: Medicare Other

## 2017-06-06 ENCOUNTER — Ambulatory Visit
Admission: RE | Admit: 2017-06-06 | Discharge: 2017-06-06 | Disposition: A | Discharge status: Home / Self Care | Payer: Medicare Other | Source: Ambulatory Visit | Attending: Radiation Oncology | Admitting: Radiation Oncology

## 2017-06-06 DIAGNOSIS — Z510 Encounter for antineoplastic radiation therapy: Secondary | ICD-10-CM | POA: Insufficient documentation

## 2017-06-06 DIAGNOSIS — C3492 Malignant neoplasm of unspecified part of left bronchus or lung: Secondary | ICD-10-CM | POA: Insufficient documentation

## 2017-06-12 DIAGNOSIS — Z510 Encounter for antineoplastic radiation therapy: Secondary | ICD-10-CM | POA: Diagnosis not present

## 2017-06-18 ENCOUNTER — Ambulatory Visit: Payer: Medicare Other

## 2017-06-20 ENCOUNTER — Ambulatory Visit
Admission: RE | Admit: 2017-06-20 | Discharge: 2017-06-20 | Disposition: A | Discharge status: Home / Self Care | Payer: Medicare Other | Source: Ambulatory Visit | Attending: Radiation Oncology | Admitting: Radiation Oncology

## 2017-06-20 DIAGNOSIS — Z510 Encounter for antineoplastic radiation therapy: Secondary | ICD-10-CM | POA: Diagnosis not present

## 2017-06-22 ENCOUNTER — Ambulatory Visit
Admission: RE | Admit: 2017-06-22 | Discharge: 2017-06-22 | Disposition: A | Discharge status: Home / Self Care | Payer: Medicare Other | Source: Ambulatory Visit | Attending: Radiation Oncology | Admitting: Radiation Oncology

## 2017-06-22 DIAGNOSIS — Z510 Encounter for antineoplastic radiation therapy: Secondary | ICD-10-CM | POA: Diagnosis not present

## 2017-06-25 ENCOUNTER — Ambulatory Visit: Admission: RE | Admit: 2017-06-25 | Payer: Medicare Other | Source: Ambulatory Visit

## 2017-06-25 ENCOUNTER — Ambulatory Visit: Payer: Medicare Other

## 2017-06-27 ENCOUNTER — Ambulatory Visit
Admission: RE | Admit: 2017-06-27 | Discharge: 2017-06-27 | Disposition: A | Discharge status: Home / Self Care | Payer: Medicare Other | Source: Ambulatory Visit | Attending: Radiation Oncology | Admitting: Radiation Oncology

## 2017-06-27 DIAGNOSIS — Z510 Encounter for antineoplastic radiation therapy: Secondary | ICD-10-CM | POA: Diagnosis not present

## 2017-06-29 ENCOUNTER — Ambulatory Visit
Admission: RE | Admit: 2017-06-29 | Discharge: 2017-06-29 | Disposition: A | Discharge status: Home / Self Care | Payer: Medicare Other | Source: Ambulatory Visit | Attending: Radiation Oncology | Admitting: Radiation Oncology

## 2017-06-29 DIAGNOSIS — Z510 Encounter for antineoplastic radiation therapy: Secondary | ICD-10-CM | POA: Diagnosis not present

## 2017-07-02 ENCOUNTER — Ambulatory Visit
Admission: RE | Admit: 2017-07-02 | Discharge: 2017-07-02 | Disposition: A | Discharge status: Home / Self Care | Payer: Medicare Other | Source: Ambulatory Visit | Attending: Radiation Oncology | Admitting: Radiation Oncology

## 2017-07-02 DIAGNOSIS — R911 Solitary pulmonary nodule: Secondary | ICD-10-CM | POA: Insufficient documentation

## 2017-07-02 DIAGNOSIS — Z510 Encounter for antineoplastic radiation therapy: Secondary | ICD-10-CM | POA: Diagnosis not present

## 2017-08-07 ENCOUNTER — Ambulatory Visit (INDEPENDENT_AMBULATORY_CARE_PROVIDER_SITE_OTHER): Payer: Medicare Other | Admitting: *Deleted

## 2017-08-07 DIAGNOSIS — I447 Left bundle-branch block, unspecified: Secondary | ICD-10-CM | POA: Diagnosis not present

## 2017-08-07 DIAGNOSIS — I441 Atrioventricular block, second degree: Secondary | ICD-10-CM

## 2017-08-08 NOTE — Progress Notes (Signed)
Remote pacemaker transmission.   

## 2017-08-13 ENCOUNTER — Ambulatory Visit
Admission: RE | Admit: 2017-08-13 | Discharge: 2017-08-13 | Disposition: A | Discharge status: Home / Self Care | Payer: Medicare Other | Source: Ambulatory Visit | Attending: Radiation Oncology | Admitting: Radiation Oncology

## 2017-08-13 ENCOUNTER — Other Ambulatory Visit: Payer: Self-pay

## 2017-08-13 ENCOUNTER — Other Ambulatory Visit: Payer: Self-pay | Admitting: *Deleted

## 2017-08-13 ENCOUNTER — Encounter: Payer: Self-pay | Admitting: Radiation Oncology

## 2017-08-13 VITALS — BP 134/65 | HR 85 | Temp 98.4°F | Wt 205.8 lb

## 2017-08-13 DIAGNOSIS — R911 Solitary pulmonary nodule: Secondary | ICD-10-CM

## 2017-08-13 DIAGNOSIS — C3410 Malignant neoplasm of upper lobe, unspecified bronchus or lung: Secondary | ICD-10-CM

## 2017-08-13 NOTE — Progress Notes (Signed)
Radiation Oncology Follow up Note  Name: Jacob [name]   Date:   08/13/2017 MRN:  [id number] DOB: [birth year]    This 84 y.o. male presents to the clinic today for one-month follow-up status postSB RT.to his left lung.  REFERRING PROVIDER: [name], MD  HPI: patient is an 84 year old male now out 1 month having undergone SB RT to his left lung for a stage I non-small cell lung cancer..patient also has a history of left upper tract urothelial carcinoma as well as bladder cancer and is followed by urology for that. He is seen today in routine follow-up he is asymptomatic he has a mild nonproductive cough specifically denies any change in pulmonary function. He is according to the patient asymptomatic as far as lower urinary tract symptoms.  COMPLICATIONS OF TREATMENT: none  FOLLOW UP COMPLIANCE: keeps appointments   PHYSICAL EXAM:  BP 134/65 (BP Location: Left Arm)   Pulse 85   Temp 98.4 F (36.9 C) (Tympanic)   Wt 205 lb 12.8 oz (93.3 kg)   BMI 30.39 kg/m  Well-developed well-nourished patient in NAD. HEENT reveals PERLA, EOMI, discs not visualized.  Oral cavity is clear. No oral mucosal lesions are identified. Neck is clear without evidence of cervical or supraclavicular adenopathy. Lungs are clear to A&P. Cardiac examination is essentially unremarkable with regular rate and rhythm without murmur rub or thrill. Abdomen is benign with no organomegaly or masses noted. Motor sensory and DTR levels are equal and symmetric in the upper and lower extremities. Cranial nerves II through XII are grossly intact. Proprioception is intact. No peripheral adenopathy or edema is identified. No motor or sensory levels are noted. Crude visual fields are within normal range.  RADIOLOGY RESULTS: no current films for review  PLAN: at this time patient is a PET CT scan by Dr. [name] in September. I would like to see one about 3 months from now although if the PET scan shows decreased metabolic  activity we will put that off. I will see him out in 3 months make decisions about CT scan at that time. Patient continues close follow-up care by both medical oncology and urology. Patient is to call with any concerns.  I would like to take this opportunity to thank you for allowing me to participate in the care of your patient.Noreene Filbert, MD

## 2017-08-23 ENCOUNTER — Ambulatory Visit (INDEPENDENT_AMBULATORY_CARE_PROVIDER_SITE_OTHER): Payer: Medicare Other | Admitting: Internal Medicine

## 2017-08-23 ENCOUNTER — Encounter: Payer: Self-pay | Admitting: Internal Medicine

## 2017-08-23 VITALS — BP 174/82 | HR 74 | Ht 66.0 in | Wt 201.0 lb

## 2017-08-23 DIAGNOSIS — R001 Bradycardia, unspecified: Secondary | ICD-10-CM

## 2017-08-23 DIAGNOSIS — Z950 Presence of cardiac pacemaker: Secondary | ICD-10-CM | POA: Diagnosis not present

## 2017-08-23 DIAGNOSIS — I447 Left bundle-branch block, unspecified: Secondary | ICD-10-CM | POA: Diagnosis not present

## 2017-08-23 DIAGNOSIS — I441 Atrioventricular block, second degree: Secondary | ICD-10-CM | POA: Diagnosis not present

## 2017-08-23 NOTE — Patient Instructions (Signed)
Medication Instructions: - Your physician recommends that you continue on your current medications as directed. Please refer to the Current Medication list given to you today.  Labwork: - none ordered  Procedures/Testing: - none ordered  Follow-Up: - Remote monitoring is used to monitor your Pacemaker of ICD from home. This monitoring reduces the number of office visits required to check your device to one time per year. It allows [place] to keep an eye on the functioning of your device to ensure it is working properly. You are scheduled for a device check from home on 11/06/17. You may send your transmission at any time that day. If you have a wireless device, the transmission will be sent automatically. After your physician reviews your transmission, you will receive a postcard with your next transmission date.  - Dr. Caryl Comes will see you back in 1 year, unless you decide to follow up elsewhere. If you decide to follow back up with Dr. Caryl Comes, please request the 1st appointment for the morning.  Any Additional Special Instructions Will Be Listed Below (If Applicable).     If you need a refill on your cardiac medications before your next appointment, please call your pharmacy.

## 2017-08-23 NOTE — Progress Notes (Signed)
Patient Care Team: [name], MD (Inactive) as PCP - General (Internal Medicine)   HPI  Jacob [name] is a 84 y.o. male Seen in follow-up for a pacemaker implanted 8/15 by Dr. Elliot Cousin for symptomatic bradycardia and 2-1 heart block.  He had an antecedent history of left bundle branch block and coronary artery disease with preserved LV function. Echo 8/15 demonstrated EF 60-65% without wall motion abnormalities.  He has a history of atrial fibrillation and hypertension.  He was discharged with aspirin;  no atrial fibrillation however has been recorded on his device.  The patient denies chest pain, shortness of breath, nocturnal dyspnea, orthopnea or peripheral edema.  There have been no palpitations, lightheadedness or syncope.    He has a history of renal cell carcinoma for which he underwent nephrectomy. He has modest persistent renal insufficiency. Notes 2/18 [facility] nephrology were reviewed  Date Cr K Hgb  3/18 2.27 4.8 12.9   1/19 1.92 5.0 13.1       Past Medical History:  Diagnosis Date  . Arthritis    hands  . At risk for sleep apnea    STOP-BANG=  4     SENT TO PCP 06-19-5013  . BPH (benign prostatic hypertrophy)   . Coronary artery disease cardiologist-- dr Fletcher Anon  and dr gregg taylor   a. 2006 s/p PCI/Stenting x 2 to the RCA;  b. 05/03/12 Cardiac CT: Patent RCA stent, nonobs dzs;  c. 01/2013 Ex MV: nl EF, no ischemia.  Marland Kitchen GERD (gastroesophageal reflux disease)   . Glaucoma of both eyes   . H/O hiatal hernia   . History of atrial fibrillation    a. 2006.  Marland Kitchen History of diverticulitis   . History of kidney stones   . History of renal cell carcinoma    08-22-2013  s/p  left nephrouretectomy  . Hyperlipidemia   . Hyperlipidemia   . Hypertension   . LBBB (left bundle branch block)   . Mobitz type 2 second degree AV block   . Recurrent bladder transitional cell carcinoma ([place]) monitored by dr [name]   s/p turb's  and chemo bladder instillations  .  Renal insufficiency    a. Creat rose to 2.34 post-op L nephrectomy.  . S/P placement of cardiac pacemaker MEDTRONIC last pacer check 12-01-2013   08-29-2013  for symptomatic bradycardia and mobitz 2 second degree heart block  . Symptomatic bradycardia    s/p  pacemaker placement 08-29-2013    Past Surgical History:  Procedure Laterality Date  . APPENDECTOMY  02/1988  . CATARACT EXTRACTION W/ INTRAOCULAR LENS  IMPLANT, BILATERAL  2011  . CORONARY ANGIOPLASTY WITH STENT PLACEMENT  2006    [facility]   X2 STENTS  TO RCA  . CYSTOSCOPY W/ RETROGRADES Right 01/13/2014   Procedure: CYSTOSCOPY WITH RETROGRADE PYELOGRAM;  Surgeon: [name], MD;  Location: [facility];  Service: Urology;  Laterality: Right;  . CYSTOSCOPY W/ URETERAL STENT PLACEMENT Left 08/22/2013   Procedure: CYSTOSCOPY WITH RETROGRADE PYELOGRAM/URETERAL STENT PLACEMENT;  Surgeon: [name], MD;  Location: [facility];  Service: Urology;  Laterality: Left;  . CYSTOSCOPY WITH BIOPSY  04/13/2011     Procedure: CYSTOSCOPY WITH BIOPSY;  Surgeon: [name], MD;  Location: [facility];  Service: Urology;  Laterality: N/A;  Cold Cup Biopsy  . CYSTOSCOPY WITH RETROGRADE PYELOGRAM, URETEROSCOPY AND STENT PLACEMENT Left 06/19/2013   Procedure: CYSTOSCOPY WITH LEFT RETROGRADE PYELOGRAM, LEFT URETEROSCOPY, ureteral balloon dilatation, BIOPSY LEFT KIDNEY;  Surgeon: [name], MD;  Location: [facility];  Service: Urology;  Laterality: Left;  . INGUINAL HERNIA REPAIR Left 08/22/2013   Procedure: LAPAROSCOPIC INGUINAL HERNIA;  Surgeon: [name], MD;  Location: [facility];  Service: Urology;  Laterality: Left;  . PERMANENT PACEMAKER INSERTION N/A 08/29/2013   Procedure: PERMANENT PACEMAKER INSERTION;  Surgeon: [name], MD;  Location: [facility];  Service: Cardiovascular;  Laterality: N/A;  Medtronic dual-chamber  . ROBOT ASSITED LAPAROSCOPIC NEPHROURETERECTOMY Left 08/22/2013   Procedure: ROBOT  ASSITED LAPAROSCOPIC NEPHROURETERECTOMY;  Surgeon: [name], MD;  Location: [facility];  Service: Urology;  Laterality: Left;  . TRANSTHORACIC ECHOCARDIOGRAM  08-29-2013   mild LVH/  ef 60-65% /  grade I diastolic dysfunction/  mild MR/  mild LAE  and RAE/  moderate TR  . TRANSURETHRAL RESECTION OF BLADDER TUMOR  12-22-2010  . TRANSURETHRAL RESECTION OF BLADDER TUMOR  08/04/2011   Procedure: TRANSURETHRAL RESECTION OF BLADDER TUMOR (TURBT);  Surgeon: [name], MD;  Location: [facility];  Service: Urology;  Laterality: N/A;  . TRANSURETHRAL RESECTION OF BLADDER TUMOR N/A 01/13/2014   Procedure: TRANSURETHRAL RESECTION OF BLADDER TUMOR (TURBT);  Surgeon: [name], MD;  Location: [facility];  Service: Urology;  Laterality: N/A;    Current Outpatient Medications  Medication Sig Dispense Refill  . acetaminophen (TYLENOL) 325 MG tablet Take 650 mg by mouth every 4 (four) hours as needed.    Marland Kitchen aspirin 81 MG tablet Take 81 mg by mouth daily.    . budesonide-formoterol (SYMBICORT) 160-4.5 MCG/ACT inhaler Inhale 2 puffs into the lungs 2 (two) times daily. Rinse mouth after use. 1 Inhaler 12  . cetirizine (ZYRTEC) 10 MG tablet Take 10 mg by mouth daily.    Marland Kitchen DEXILANT 60 MG capsule     . furosemide (LASIX) 20 MG tablet Take 20 mg by mouth daily as needed.     . isosorbide mononitrate (IMDUR) 30 MG 24 hr tablet Take 30 mg by mouth at bedtime.     Marland Kitchen latanoprost (XALATAN) 0.005 % ophthalmic solution Place 1 drop into both eyes at bedtime.     Marland Kitchen losartan (COZAAR) 50 MG tablet Take 1 tablet by mouth every morning.     . lovastatin (MEVACOR) 20 MG tablet Take 20 mg by mouth at bedtime.     . metoprolol tartrate (LOPRESSOR) 25 MG tablet Take 12.5 mg by mouth every 12 (twelve) hours.    . Multiple Vitamins-Minerals (MULTIVITAMINS THER. W/MINERALS) TABS Take 1 tablet by mouth daily.     . Omega-3 Fatty Acids (FISH OIL) 1200 MG CPDR Take by mouth.    Marland Kitchen PROAIR HFA  108 (90 Base) MCG/ACT inhaler Inhale 1 puff into the lungs every 4 (four) hours as needed.     . senna-docusate (SENOKOT-S) 8.6-50 MG per tablet Take 1 tablet by mouth 2 (two) times daily. While taking pain meds to prevent constipation 30 tablet 0   No current facility-administered medications for this visit.     Allergies  Allergen Reactions  . Penicillins Hives    BLISTERS      Review of Systems negative except from HPI and PMH  Physical Exam BP (!) 174/82 (BP Location: Left Arm, Patient Position: Sitting, Cuff Size: Normal)   Pulse 74   Ht 5\' 6"  (1.676 m)   Wt 201 lb (91.2 kg)   BMI 32.44 kg/m  Well developed and nourished in no acute distress HENT normal Neck supple with JVP-flat  Clear Device pocket well healed; without hematoma or erythema.  There is no tethering  Regular rate and rhythm, no murmurs or gallops Abd-soft with active BS No Clubbing cyanosis edema Skin-warm and dry A & Oriented  Grossly normal sensory and motor function     ECG demonstrates P-synchronous/ AV  pacing   Assessment and  Plan  Hypertension  Symptomatic bradycardia with 2:1 block  Bidirectional bundle branch block  Ventricular tachycardia-nonsustained  COPD  Pacemaker-Medtronic       Nonsustained ventricular tachycardia will follow in the setting of normal LV function  Device function is normal  Frustrated it might be late.  Offered the alternative being referred to the [place] clinic or [place].  Blood pressure poorly controlled.  Not sure that is not whitecoat given his frustration.  We will have him follow it up at home.  We spent more than 50% of our >25 min visit in face to face counseling regarding the above

## 2017-08-24 LAB — CUP PACEART REMOTE DEVICE CHECK
Battery Remaining Longevity: 101 mo
Brady Statistic AP VP Percent: 75 %
Brady Statistic AS VP Percent: 24 %
Brady Statistic AS VS Percent: 0 %
Date Time Interrogation Session: 20190806134416
Implantable Lead Implant Date: 20150903
Implantable Lead Implant Date: 20150903
Implantable Lead Location: 753860
Implantable Pulse Generator Implant Date: 20150903
Lead Channel Impedance Value: 498 Ohm
Lead Channel Impedance Value: 555 Ohm
Lead Channel Pacing Threshold Amplitude: 0.75 V
Lead Channel Pacing Threshold Pulse Width: 0.4 ms
Lead Channel Setting Pacing Amplitude: 2 V
Lead Channel Setting Sensing Sensitivity: 2.8 mV
MDC IDC LEAD LOCATION: 753859
MDC IDC MSMT BATTERY IMPEDANCE: 253 Ohm
MDC IDC MSMT BATTERY VOLTAGE: 2.79 V
MDC IDC MSMT LEADCHNL RA PACING THRESHOLD AMPLITUDE: 0.75 V
MDC IDC MSMT LEADCHNL RA PACING THRESHOLD PULSEWIDTH: 0.4 ms
MDC IDC SET LEADCHNL RV PACING AMPLITUDE: 2.5 V
MDC IDC SET LEADCHNL RV PACING PULSEWIDTH: 0.4 ms
MDC IDC STAT BRADY AP VS PERCENT: 0 %

## 2017-08-29 DIAGNOSIS — E119 Type 2 diabetes mellitus without complications: Secondary | ICD-10-CM | POA: Insufficient documentation

## 2017-08-29 HISTORY — DX: Type 2 diabetes mellitus without complications: E11.9

## 2017-09-23 NOTE — Progress Notes (Signed)
[facility]  [phone]) [phone] [phone]) [phone]  ID: [name] OB: 1928-09-25  MR#: [id number]  NLG#:[id number]  Patient Care Team: [name], MD as PCP - General (Internal Medicine)  CHIEF COMPLAINT: Nodule of the left lung.  INTERVAL HISTORY: Patient returns to clinic today for further evaluation and discussion of his imaging results.  He continues to have chronic dyspnea on exertion, but otherwise feels well.  He has no neurologic complaints. He denies any recent fevers or illnesses. He has a good appetite and denies weight loss. He denies any chest pain, cough, or hemoptysis. He has no nausea, vomiting, constipation, or diarrhea. He has no new urinary complaints.  Patient is at his baseline offers no specific complaints today.  REVIEW OF SYSTEMS:   Review of Systems  Constitutional: Negative.  Negative for fever, malaise/fatigue and weight loss.  Respiratory: Negative.  Negative for cough, hemoptysis and shortness of breath.   Cardiovascular: Negative.  Negative for chest pain and leg swelling.  Gastrointestinal: Negative.  Negative for abdominal pain.  Genitourinary: Negative.  Negative for dysuria.  Musculoskeletal: Negative.  Negative for back pain.  Skin: Negative.  Negative for rash.  Neurological: Negative.  Negative for sensory change, focal weakness and weakness.  Psychiatric/Behavioral: Negative.  The patient is not nervous/anxious.     As per HPI. Otherwise, a complete review of systems is negative.  PAST MEDICAL HISTORY: Past Medical History:  Diagnosis Date  . Arthritis    hands  . At risk for sleep apnea    STOP-BANG=  4     SENT TO PCP 06-19-5013  . BPH (benign prostatic hypertrophy)   . Coronary artery disease cardiologist-- dr Fletcher Anon  and dr gregg taylor   a. 2006 s/p PCI/Stenting x 2 to the RCA;  b. 05/03/12 Cardiac CT: Patent RCA stent, nonobs dzs;  c. 01/2013 Ex MV: nl EF, no ischemia.  Marland Kitchen GERD (gastroesophageal reflux  disease)   . Glaucoma of both eyes   . H/O hiatal hernia   . History of atrial fibrillation    a. 2006.  Marland Kitchen History of diverticulitis   . History of kidney stones   . History of renal cell carcinoma    08-22-2013  s/p  left nephrouretectomy  . Hyperlipidemia   . Hyperlipidemia   . Hypertension   . LBBB (left bundle branch block)   . Mobitz type 2 second degree AV block   . Recurrent bladder transitional cell carcinoma ([place]) monitored by dr [name]   s/p turb's  and chemo bladder instillations  . Renal insufficiency    a. Creat rose to 2.34 post-op L nephrectomy.  . S/P placement of cardiac pacemaker MEDTRONIC last pacer check 12-01-2013   08-29-2013  for symptomatic bradycardia and mobitz 2 second degree heart block  . Symptomatic bradycardia    s/p  pacemaker placement 08-29-2013    PAST SURGICAL HISTORY: Past Surgical History:  Procedure Laterality Date  . APPENDECTOMY  02/1988  . CATARACT EXTRACTION W/ INTRAOCULAR LENS  IMPLANT, BILATERAL  2011  . CORONARY ANGIOPLASTY WITH STENT PLACEMENT  2006    [facility]   X2 STENTS  TO RCA  . CYSTOSCOPY W/ RETROGRADES Right 01/13/2014   Procedure: CYSTOSCOPY WITH RETROGRADE PYELOGRAM;  Surgeon: [name], MD;  [facility];  Service: Urology;  Laterality: Right;  . CYSTOSCOPY W/ URETERAL STENT PLACEMENT Left 08/22/2013   Procedure: CYSTOSCOPY WITH RETROGRADE PYELOGRAM/URETERAL STENT PLACEMENT;  Surgeon: [name], MD;  Location[facility];  Service: Urology;  Laterality: Left;  . CYSTOSCOPY WITH BIOPSY  04/13/2011     Procedure: CYSTOSCOPY WITH BIOPSY;  Surgeon: [name], MD;  Location[facility];  Service: Urology;  Laterality: N/A;  Cold Cup Biopsy  . CYSTOSCOPY WITH RETROGRADE PYELOGRAM, URETEROSCOPY AND STENT PLACEMENT Left 06/19/2013   Procedure: CYSTOSCOPY WITH LEFT RETROGRADE PYELOGRAM, LEFT URETEROSCOPY, ureteral balloon dilatation, BIOPSY LEFT KIDNEY;  Surgeon: [name], MD;   Location: [facility];  Service: Urology;  Laterality: Left;  . INGUINAL HERNIA REPAIR Left 08/22/2013   Procedure: LAPAROSCOPIC INGUINAL HERNIA;  Surgeon: [name], MD;  Location[facility];  Service: Urology;  Laterality: Left;  . PERMANENT PACEMAKER INSERTION N/A 08/29/2013   Procedure: PERMANENT PACEMAKER INSERTION;  Surgeon: [name], MD;  Location: [facility] LAB;  Service: Cardiovascular;  Laterality: N/A;  Medtronic dual-chamber  . ROBOT ASSITED LAPAROSCOPIC NEPHROURETERECTOMY Left 08/22/2013   Procedure: ROBOT ASSITED LAPAROSCOPIC NEPHROURETERECTOMY;  Surgeon: [name], MD;  Location: [facility];  Service: Urology;  Laterality: Left;  . TRANSTHORACIC ECHOCARDIOGRAM  08-29-2013   mild LVH/  ef 60-65% /  grade I diastolic dysfunction/  mild MR/  mild LAE  and RAE/  moderate TR  . TRANSURETHRAL RESECTION OF BLADDER TUMOR  12-22-2010  . TRANSURETHRAL RESECTION OF BLADDER TUMOR  08/04/2011   Procedure: TRANSURETHRAL RESECTION OF BLADDER TUMOR (TURBT);  Surgeon: [name], MD;  Location: [facility];  Service: Urology;  Laterality: N/A;  . TRANSURETHRAL RESECTION OF BLADDER TUMOR N/A 01/13/2014   Procedure: TRANSURETHRAL RESECTION OF BLADDER TUMOR (TURBT);  Surgeon: [name], MD;  Location[facility];  Service: Urology;  Laterality: N/A;    FAMILY HISTORY: Family History  Problem Relation Age of Onset  . Heart attack Mother   . Heart attack Father   . Prostate cancer Neg Hx   . Bladder Cancer Neg Hx   . Kidney cancer Neg Hx     ADVANCED DIRECTIVES (Y/N):  N  HEALTH MAINTENANCE: Social History   Tobacco Use  . Smoking status: Former Smoker    Packs/day: 1.00    Years: 50.00    Pack years: 50.00    Types: Cigarettes    Last attempt to quit: 11/02/2000    Years since quitting: 16.9  . Smokeless tobacco: Never Used  Substance Use Topics  . Alcohol use: No  . Drug use: No     Colonoscopy:  PAP:  Bone  density:  Lipid panel:  Allergies  Allergen Reactions  . Penicillins Hives    BLISTERS    Current Outpatient Medications  Medication Sig Dispense Refill  . aspirin 81 MG tablet Take 81 mg by mouth daily.    . budesonide-formoterol (SYMBICORT) 160-4.5 MCG/ACT inhaler Inhale 2 puffs into the lungs 2 (two) times daily. Rinse mouth after use. 1 Inhaler 12  . cetirizine (ZYRTEC) 10 MG tablet Take 10 mg by mouth daily.    Marland Kitchen DEXILANT 60 MG capsule Take 60 mg by mouth daily.     . isosorbide mononitrate (IMDUR) 30 MG 24 hr tablet Take 30 mg by mouth at bedtime.     Marland Kitchen latanoprost (XALATAN) 0.005 % ophthalmic solution Place 1 drop into both eyes at bedtime.     Marland Kitchen losartan (COZAAR) 50 MG tablet Take 1 tablet by mouth every morning.     . lovastatin (MEVACOR) 20 MG tablet Take 20 mg by mouth at bedtime.     . metoprolol tartrate (LOPRESSOR) 25 MG  tablet Take 12.5 mg by mouth every 12 (twelve) hours.    . Multiple Vitamins-Minerals (MULTIVITAMINS THER. W/MINERALS) TABS Take 1 tablet by mouth daily.     . Omega-3 Fatty Acids (FISH OIL) 1200 MG CPDR Take by mouth.    . senna-docusate (SENOKOT-S) 8.6-50 MG per tablet Take 1 tablet by mouth 2 (two) times daily. While taking pain meds to prevent constipation 30 tablet 0  . acetaminophen (TYLENOL) 325 MG tablet Take 650 mg by mouth every 4 (four) hours as needed.    . furosemide (LASIX) 20 MG tablet Take 20 mg by mouth daily as needed.     Marland Kitchen PROAIR HFA 108 (90 Base) MCG/ACT inhaler Inhale 1 puff into the lungs every 4 (four) hours as needed.      No current facility-administered medications for this visit.     OBJECTIVE: Vitals:   09/24/17 1410  BP: (!) 131/56  Pulse: 89  Resp: 20  Temp: 97.6 F (36.4 C)     Body mass index is 32.6 kg/m.    ECOG FS:0 - Asymptomatic  General: Well-developed, well-nourished, no acute distress. Eyes: Pink conjunctiva, anicteric sclera. HEENT: Normocephalic, moist mucous membranes. Lungs: Clear to auscultation  bilaterally. Heart: Regular rate and rhythm. No rubs, murmurs, or gallops. Abdomen: Soft, nontender, nondistended. No organomegaly noted, normoactive bowel sounds. Musculoskeletal: No edema, cyanosis, or clubbing. Neuro: Alert, answering all questions appropriately. Cranial nerves grossly intact. Skin: No rashes or petechiae noted. Psych: Normal affect.  LAB RESULTS:  Lab Results  Component Value Date   NA 141 03/27/2015   K 4.4 03/27/2015   CL 106 03/27/2015   CO2 31 03/27/2015   GLUCOSE 146 (H) 03/27/2015   BUN 30 (H) 03/27/2015   CREATININE 2.10 (H) 05/16/2017   CALCIUM 8.4 (L) 03/27/2015   PROT 7.6 07/07/2011   ALBUMIN 3.6 07/07/2011   AST 23 07/07/2011   ALT 23 07/07/2011   ALKPHOS 69 07/07/2011   BILITOT 0.5 07/07/2011   GFRNONAA 32 (L) 03/27/2015   GFRAA 38 (L) 03/27/2015    Lab Results  Component Value Date   WBC 7.1 03/27/2015   NEUTROABS 5.1 03/27/2015   HGB 12.0 (L) 03/27/2015   HCT 36.9 (L) 03/27/2015   MCV 93.2 03/27/2015   PLT 237 03/27/2015     STUDIES: Nm Pet Image Restag (ps) Skull Base To Thigh  Result Date: 09/24/2017 CLINICAL DATA:  Subsequent treatment strategy for lung nodule. EXAM: NUCLEAR MEDICINE PET SKULL BASE TO THIGH TECHNIQUE: 0.4 mCi F-18 FDG was injected intravenously. Full-ring PET imaging was performed from the skull base to thigh after the radiotracer. CT data was obtained and used for attenuation correction and anatomic localization. Fasting blood glucose: 107 mg/dl COMPARISON:  05/25/2017 and CT chest 05/16/2017. FINDINGS: Mediastinal blood pool activity: SUV max 2.4 NECK: No hypermetabolic lymph nodes in the neck. Incidental CT findings: None. CHEST: No hypermetabolic mediastinal, hilar or axillary lymph nodes. Previously seen subpleural lingular nodule is no longer visualized, with linear and subpleural consolidation in its place, with an SUV max of 3.3. Incidental CT findings: Atherosclerotic calcification of the arterial vasculature,  including coronary arteries. Heart is enlarged. No pericardial effusion. ABDOMEN/PELVIS: No abnormal hypermetabolism in the liver, adrenal glands, spleen or pancreas. No hypermetabolic lymph nodes. There is mild patchy uptake in prostate without a focal hypermetabolic lesion, as on 74/25/9563. Incidental CT findings: Liver, gallbladder and right adrenal gland are unremarkable. 10 mm nodule in the left adrenal gland is unchanged but too small to characterize. Right  kidney is unremarkable. Left kidney is absent. Spleen pancreas, stomach and bowel are grossly unremarkable. Periumbilical hernia contains a knuckle of unobstructed colon. Atherosclerotic calcification of the arterial vasculature. Infrarenal aorta measures up to 2.9 cm. SKELETON: No abnormal osseous hypermetabolism. Incidental CT findings: None. IMPRESSION: 1. Lingular nodule no longer identified. Presumed radiation changes in lingula and left lower lobe, with mild associated hypermetabolism. No evidence of metastatic disease. 2. Mild patchy uptake in prostate, without focality as on the prior exam. 3. Aortic atherosclerosis (ICD10-170.0). Coronary artery calcification. 4. Ectatic abdominal aorta at risk for aneurysm development. Recommend followup by ultrasound in 5 years. This recommendation follows ACR consensus guidelines: White Paper of the ACR Incidental Findings Committee II on Vascular Findings. J Am Coll Radiol 2013; 16:073-710. 5. Periumbilical hernia contains a knuckle of unobstructed colon. Electronically Signed   By: Lorin Picket M.D.   On: 09/24/2017 12:30    ASSESSMENT: Nodule of the left lung.  PLAN:    1. Nodule of the left lung: No evidence of recurrent or progressive disease.  PET scan results from September 24, 2017 reviewed independently and reported as above with resolution of lingular nodule.  Patient completed his XRT in July 2019.  No intervention is needed at this time.  Return to clinic in 6 months with repeat imaging  and further evaluation.   2.  Left upper tract urothelial carcinoma: Continue follow-up with urology and surveillance cystoscopies as scheduled.  I spent a total of 20 minutes face-to-face with the patient of which greater than 50% of the visit was spent in counseling and coordination of care as detailed above.   Patient expressed understanding and was in agreement with this plan. He also understands that He can call clinic at any time with any questions, concerns, or complaints.   Cancer Staging No matching staging information was found for the patient.  Lloyd Huger, MD   09/28/2017 1:04 PM

## 2017-09-24 ENCOUNTER — Ambulatory Visit
Admission: RE | Admit: 2017-09-24 | Discharge: 2017-09-24 | Disposition: A | Discharge status: Home / Self Care | Payer: Medicare Other | Source: Ambulatory Visit | Attending: Oncology | Admitting: Oncology

## 2017-09-24 ENCOUNTER — Other Ambulatory Visit: Payer: Self-pay

## 2017-09-24 ENCOUNTER — Inpatient Hospital Stay: Payer: Medicare Other | Attending: Oncology | Admitting: Oncology

## 2017-09-24 VITALS — BP 131/56 | HR 89 | Temp 97.6°F | Resp 20 | Wt 202.0 lb

## 2017-09-24 DIAGNOSIS — K429 Umbilical hernia without obstruction or gangrene: Secondary | ICD-10-CM | POA: Insufficient documentation

## 2017-09-24 DIAGNOSIS — I4891 Unspecified atrial fibrillation: Secondary | ICD-10-CM | POA: Diagnosis not present

## 2017-09-24 DIAGNOSIS — C679 Malignant neoplasm of bladder, unspecified: Secondary | ICD-10-CM | POA: Insufficient documentation

## 2017-09-24 DIAGNOSIS — I700 Atherosclerosis of aorta: Secondary | ICD-10-CM | POA: Insufficient documentation

## 2017-09-24 DIAGNOSIS — Z87891 Personal history of nicotine dependence: Secondary | ICD-10-CM | POA: Insufficient documentation

## 2017-09-24 DIAGNOSIS — R911 Solitary pulmonary nodule: Secondary | ICD-10-CM | POA: Insufficient documentation

## 2017-09-24 DIAGNOSIS — I10 Essential (primary) hypertension: Secondary | ICD-10-CM | POA: Insufficient documentation

## 2017-09-24 DIAGNOSIS — R0609 Other forms of dyspnea: Secondary | ICD-10-CM | POA: Diagnosis not present

## 2017-09-24 DIAGNOSIS — I2510 Atherosclerotic heart disease of native coronary artery without angina pectoris: Secondary | ICD-10-CM | POA: Diagnosis not present

## 2017-09-24 DIAGNOSIS — I77811 Abdominal aortic ectasia: Secondary | ICD-10-CM | POA: Diagnosis not present

## 2017-09-24 LAB — GLUCOSE, CAPILLARY: GLUCOSE-CAPILLARY: 107 mg/dL — AB (ref 70–99)

## 2017-09-24 MED ORDER — FLUDEOXYGLUCOSE F - 18 (FDG) INJECTION
10.4300 | Freq: Once | INTRAVENOUS | Status: AC | PRN
  Administered 2017-09-24: 10.43 via INTRAVENOUS

## 2017-09-24 NOTE — Progress Notes (Signed)
Here for follow up "" feeling good and hoping for a good report " pt sob on arrival to clinic, short shuffling steps- pulse ox 95% on RA -pt stated mild sob passed quickly

## 2017-11-06 ENCOUNTER — Ambulatory Visit (INDEPENDENT_AMBULATORY_CARE_PROVIDER_SITE_OTHER): Payer: Medicare Other | Admitting: *Deleted

## 2017-11-06 DIAGNOSIS — I441 Atrioventricular block, second degree: Secondary | ICD-10-CM

## 2017-11-07 NOTE — Progress Notes (Signed)
Remote pacemaker transmission.   

## 2017-11-11 ENCOUNTER — Encounter: Payer: Self-pay | Admitting: Cardiology

## 2017-11-12 ENCOUNTER — Ambulatory Visit: Payer: Medicare Other

## 2017-11-14 ENCOUNTER — Other Ambulatory Visit: Payer: Self-pay

## 2017-11-14 ENCOUNTER — Encounter: Payer: Self-pay | Admitting: Urology

## 2017-11-14 ENCOUNTER — Ambulatory Visit (INDEPENDENT_AMBULATORY_CARE_PROVIDER_SITE_OTHER): Payer: Medicare Other | Admitting: Urology

## 2017-11-14 VITALS — BP 161/71 | HR 79 | Wt 203.0 lb

## 2017-11-14 DIAGNOSIS — Z01812 Encounter for preprocedural laboratory examination: Secondary | ICD-10-CM

## 2017-11-14 DIAGNOSIS — Z8551 Personal history of malignant neoplasm of bladder: Secondary | ICD-10-CM

## 2017-11-14 DIAGNOSIS — R972 Elevated prostate specific antigen [PSA]: Secondary | ICD-10-CM

## 2017-11-14 LAB — URINALYSIS, COMPLETE
Bilirubin, UA: NEGATIVE
Glucose, UA: NEGATIVE
Ketones, UA: NEGATIVE
Leukocytes, UA: NEGATIVE
Nitrite, UA: NEGATIVE
PH UA: 5.5 (ref 5.0–7.5)
PROTEIN UA: NEGATIVE
RBC, UA: NEGATIVE
Specific Gravity, UA: 1.025 (ref 1.005–1.030)
UUROB: 0.2 mg/dL (ref 0.2–1.0)

## 2017-11-14 LAB — MICROSCOPIC EXAMINATION
BACTERIA UA: NONE SEEN
Epithelial Cells (non renal): NONE SEEN /hpf (ref 0–10)
WBC UA: NONE SEEN /HPF (ref 0–5)

## 2017-11-14 NOTE — Progress Notes (Signed)
   11/14/17  CC:  Chief Complaint  Patient presents with  . Procedure    Cystoscopy    HPI: 84 year old male with personal history of left upper tract urothelial carcinoma as well as bladder cancer who returns for routine surveillance.       1 - Left Renal Pelvis Cancer - s/p left nephroureterectomy 08/2013 for large volume pTaG3 renal pelvis cancer with negative margins by Dr. [name]. He has an incisional/periumbilical hernia.  Post-op Surveillance: 01/2014 - Cysto small left wall bladder tumor (about 2.5cm) / CT No bulky disesae --> TaG3 by TURBT 01/2015 - Cysto / CT NED 02/2016 CT negative apart from 7 mm pulm nodule  05/2016 following with Dr. [name] for pulm nodule--> XRT 07/2017 09/2017  CT PET negative for mets  2 - Bladder Cancer -  2012 - TaG3 by TURBT 2013 - Induction BCG x 6 2014 - NED by surv cystos 01/2014 - TaG3 by TURBT --> Re-Induction BCG x6; 04/2014 Cysto NED; 07/2014 Cysto NED 01/2015 - Cysto / CTNED 02/2016 CT 02/2016 CT negative apart from 7 mm pulm nodule; normal cystoscopy  05/2017- NED  3- elevated PSA - pt PSA was 4.5 with Dr. Rosario Jacks Jan 2018. No h/o BPH. No FH PCa. Denies prior PSA elevation. DRE Jan 2018 showed a 50 + gram prostate with some subtle induration right base. Prostate measured 67 grams on Jan 2018 CT. Repeat PSA 05/2016 4.8.  PSA repeat today and pending.  Patchy nonfocal update on PET 09/2017.  4- Renal Insufficiency - solitary kidney s/p nephroureterectomy as per above. Cr post-op 2-2.5 range.    Blood pressure (!) 156/74, pulse 80, height 5\' 9"  (1.753 m), weight 200 lb (90.7 kg). NED. A&Ox3.   No respiratory distress   Abd soft, NT, ND Normal phallus with bilateral descended testicles  Cystoscopy Procedure Note  Patient identification was confirmed, informed consent was obtained, and patient was prepped using Betadine solution.  Lidocaine jelly was administered per urethral meatus.    Preoperative abx where received prior to procedure.       Pre-Procedure: - Inspection reveals a normal caliber ureteral meatus.  Procedure: The flexible cystoscope was introduced without difficulty - No urethral strictures/lesions are present. - Enlarged prostate with trilobar coaptation - Normal bladder neck -Right UO with clear reflux of urine, left UO surgically absent - Bladder mucosa  reveals no ulcers, tumors, or lesions other than stellate scar and posterior bladder wall - No bladder stones - No trabeculation  Retroflexion shows unremarkable other than small median lobe   Post-Procedure: - Patient tolerated the procedure well  Assessment/ Plan:  1. History of bladder cancer Extensive history of upper tract urothelial bladder cancer No evidence of recurrence today on cystoscopy - Urinalysis, Complete  2. Elevated PSA Given his age and comorbidities, treatment of prostate cancer may be limited benefit PSA today and if stable, defer further testing  Return in about 6 months (around 05/15/2018) for cysto.   [name], MD

## 2017-11-15 ENCOUNTER — Telehealth: Payer: Self-pay | Admitting: Urology

## 2017-11-15 ENCOUNTER — Telehealth: Payer: Self-pay

## 2017-11-15 LAB — PSA: Prostate Specific Ag, Serum: 8.1 ng/mL — ABNORMAL HIGH (ref 0.0–4.0)

## 2017-11-15 NOTE — Telephone Encounter (Signed)
Patient called back and said he already has a 6 month follow up  [facility]

## 2017-11-15 NOTE — Telephone Encounter (Signed)
App description has been changed   Sharyn Lull

## 2017-11-15 NOTE — Telephone Encounter (Signed)
Left message for pt to callour office

## 2017-11-15 NOTE — Telephone Encounter (Signed)
-----   Message from [name], MD sent at 11/15/2017 12:07 PM EST ----- PSA is up significantly from last year.  Let us recheck in 6 months and see what the trend is.  [name], MD

## 2017-11-15 NOTE — Telephone Encounter (Signed)
-----   Message from [name], MD sent at 11/15/2017 12:07 PM EST ----- Regarding: change appt description Please change this patient's appointment description for his cystoscopy in 6 months to cystoscopy/PSA so we will remember to get it done before he gets prepped.  [name], MD

## 2017-11-19 ENCOUNTER — Ambulatory Visit: Payer: Medicare Other | Admitting: Radiation Oncology

## 2018-01-01 ENCOUNTER — Other Ambulatory Visit: Payer: Self-pay | Admitting: Internal Medicine

## 2018-01-06 LAB — CUP PACEART REMOTE DEVICE CHECK
Battery Impedance: 302 Ohm
Battery Remaining Longevity: 97 mo
Brady Statistic AP VS Percent: 1 %
Brady Statistic AS VP Percent: 30 %
Brady Statistic AS VS Percent: 1 %
Date Time Interrogation Session: 20191105134044
Implantable Lead Implant Date: 20150903
Implantable Lead Location: 753859
Implantable Lead Location: 753860
Lead Channel Impedance Value: 560 Ohm
Lead Channel Pacing Threshold Amplitude: 0.875 V
Lead Channel Pacing Threshold Pulse Width: 0.4 ms
Lead Channel Setting Pacing Amplitude: 2 V
Lead Channel Setting Pacing Amplitude: 2.5 V
MDC IDC LEAD IMPLANT DT: 20150903
MDC IDC MSMT BATTERY VOLTAGE: 2.79 V
MDC IDC MSMT LEADCHNL RA IMPEDANCE VALUE: 514 Ohm
MDC IDC MSMT LEADCHNL RV PACING THRESHOLD AMPLITUDE: 0.75 V
MDC IDC MSMT LEADCHNL RV PACING THRESHOLD PULSEWIDTH: 0.4 ms
MDC IDC PG IMPLANT DT: 20150903
MDC IDC SET LEADCHNL RV PACING PULSEWIDTH: 0.4 ms
MDC IDC SET LEADCHNL RV SENSING SENSITIVITY: 2.8 mV
MDC IDC STAT BRADY AP VP PERCENT: 68 %

## 2018-01-23 ENCOUNTER — Encounter: Payer: Self-pay | Admitting: Internal Medicine

## 2018-01-31 ENCOUNTER — Telehealth: Payer: Self-pay | Admitting: Urology

## 2018-01-31 ENCOUNTER — Other Ambulatory Visit: Payer: Self-pay

## 2018-01-31 DIAGNOSIS — R972 Elevated prostate specific antigen [PSA]: Secondary | ICD-10-CM

## 2018-01-31 NOTE — Telephone Encounter (Signed)
Her his PCP hand written note, patient is wondering if she he should follow-up with me for elevated PSA.  He in fact is already being followed for this.  We had planned to recheck in May.  Please have him get a PSA PRIOR to his visit and f/u as scheduled.  [name], MD

## 2018-02-01 ENCOUNTER — Other Ambulatory Visit: Payer: Self-pay | Admitting: Family Medicine

## 2018-02-01 DIAGNOSIS — R972 Elevated prostate specific antigen [PSA]: Secondary | ICD-10-CM

## 2018-02-01 NOTE — Telephone Encounter (Signed)
Unable to leave message, no VM  PSA order is in. He just needs a lab appointment

## 2018-02-05 NOTE — Telephone Encounter (Signed)
Patient notified appoint for lab draw made and ORder was placed for PSA

## 2018-02-05 NOTE — Addendum Note (Signed)
Addended by: Tommy Rainwater on: 02/05/2018 05:33 PM   Modules accepted: Orders

## 2018-02-08 ENCOUNTER — Ambulatory Visit (INDEPENDENT_AMBULATORY_CARE_PROVIDER_SITE_OTHER): Payer: Medicare Other

## 2018-02-08 DIAGNOSIS — I441 Atrioventricular block, second degree: Secondary | ICD-10-CM | POA: Diagnosis not present

## 2018-02-15 LAB — CUP PACEART REMOTE DEVICE CHECK
Battery Impedance: 350 Ohm
Battery Voltage: 2.79 V
Brady Statistic AP VP Percent: 69 %
Brady Statistic AP VS Percent: 1 %
Brady Statistic AS VP Percent: 29 %
Brady Statistic AS VS Percent: 1 %
Implantable Lead Implant Date: 20150903
Implantable Lead Implant Date: 20150903
Implantable Lead Model: 5076
Implantable Lead Model: 5076
Lead Channel Impedance Value: 506 Ohm
Lead Channel Impedance Value: 553 Ohm
Lead Channel Pacing Threshold Amplitude: 0.875 V
Lead Channel Pacing Threshold Pulse Width: 0.4 ms
Lead Channel Setting Pacing Amplitude: 2.5 V
Lead Channel Setting Pacing Pulse Width: 0.4 ms
MDC IDC LEAD LOCATION: 753859
MDC IDC LEAD LOCATION: 753860
MDC IDC MSMT BATTERY REMAINING LONGEVITY: 92 mo
MDC IDC MSMT LEADCHNL RV PACING THRESHOLD AMPLITUDE: 0.75 V
MDC IDC MSMT LEADCHNL RV PACING THRESHOLD PULSEWIDTH: 0.4 ms
MDC IDC PG IMPLANT DT: 20150903
MDC IDC SESS DTM: 20200207215032
MDC IDC SET LEADCHNL RA PACING AMPLITUDE: 2 V
MDC IDC SET LEADCHNL RV SENSING SENSITIVITY: 2.8 mV

## 2018-02-19 NOTE — Progress Notes (Signed)
Remote pacemaker transmission.   

## 2018-03-12 ENCOUNTER — Other Ambulatory Visit: Payer: Self-pay | Admitting: Oncology

## 2018-03-12 ENCOUNTER — Other Ambulatory Visit: Payer: Self-pay

## 2018-03-12 ENCOUNTER — Ambulatory Visit
Admission: RE | Admit: 2018-03-12 | Discharge: 2018-03-12 | Disposition: A | Discharge status: Home / Self Care | Payer: Medicare Other | Source: Ambulatory Visit | Attending: Oncology | Admitting: Oncology

## 2018-03-12 DIAGNOSIS — R911 Solitary pulmonary nodule: Secondary | ICD-10-CM | POA: Diagnosis not present

## 2018-03-12 LAB — POCT I-STAT CREATININE: Creatinine, Ser: 2.3 mg/dL — ABNORMAL HIGH (ref 0.61–1.24)

## 2018-03-12 MED ORDER — IOHEXOL 300 MG/ML  SOLN: 75.0000 mL | Freq: Once | INTRAMUSCULAR | Status: DC | PRN

## 2018-03-18 ENCOUNTER — Telehealth: Payer: Self-pay | Admitting: *Deleted

## 2018-03-18 ENCOUNTER — Other Ambulatory Visit: Payer: Self-pay | Admitting: *Deleted

## 2018-03-18 ENCOUNTER — Inpatient Hospital Stay: Payer: Medicare Other | Admitting: Oncology

## 2018-03-18 DIAGNOSIS — R911 Solitary pulmonary nodule: Secondary | ICD-10-CM

## 2018-03-18 NOTE — Telephone Encounter (Signed)
Pt aware of CT results, orders entered for patient to have repeat scan and follow up with Dr. [name] in 6 months.

## 2018-03-18 NOTE — Progress Notes (Deleted)
[name]  Telephone:(336) [phone] Fax:(336) [phone]  ID: Jacob [name] OB: 12/16/1928  MR#: [id number]  GGY#:[id number]  Patient Care Team: [name], MD as PCP - General (Internal Medicine)  CHIEF COMPLAINT: Nodule of the left lung.  INTERVAL HISTORY: Patient returns to clinic today for further evaluation and discussion of his imaging results.  He continues to have chronic dyspnea on exertion, but otherwise feels well.  He has no neurologic complaints. He denies any recent fevers or illnesses. He has a good appetite and denies weight loss. He denies any chest pain, cough, or hemoptysis. He has no nausea, vomiting, constipation, or diarrhea. He has no new urinary complaints.  Patient is at his baseline offers no specific complaints today.  REVIEW OF SYSTEMS:   Review of Systems  Constitutional: Negative.  Negative for fever, malaise/fatigue and weight loss.  Respiratory: Negative.  Negative for cough, hemoptysis and shortness of breath.   Cardiovascular: Negative.  Negative for chest pain and leg swelling.  Gastrointestinal: Negative.  Negative for abdominal pain.  Genitourinary: Negative.  Negative for dysuria.  Musculoskeletal: Negative.  Negative for back pain.  Skin: Negative.  Negative for rash.  Neurological: Negative.  Negative for sensory change, focal weakness and weakness.  Psychiatric/Behavioral: Negative.  The patient is not nervous/anxious.     As per HPI. Otherwise, a complete review of systems is negative.  PAST MEDICAL HISTORY: Past Medical History:  Diagnosis Date   Arthritis    hands   At risk for sleep apnea    STOP-BANG=  4     SENT TO PCP [id number]   BPH (benign prostatic hypertrophy)    Coronary artery disease cardiologist-- dr Fletcher Anon  and dr gregg taylor   a. 2006 s/p PCI/Stenting x 2 to the RCA;  b. 05/03/12 Cardiac CT: Patent RCA stent, nonobs dzs;  c. 01/2013 Ex MV: nl EF, no ischemia.   GERD (gastroesophageal reflux  disease)    Glaucoma of both eyes    H/O hiatal hernia    History of atrial fibrillation    a. 2006.   History of diverticulitis    History of kidney stones    History of renal cell carcinoma    08-22-2013  s/p  left nephrouretectomy   Hyperlipidemia    Hyperlipidemia    Hypertension    LBBB (left bundle branch block)    Mobitz type 2 second degree AV block    Recurrent bladder transitional cell carcinoma ([place]) monitored by dr [name]   s/p turb's  and chemo bladder instillations   Renal insufficiency    a. Creat rose to 2.34 post-op L nephrectomy.   S/P placement of cardiac pacemaker [employer] last pacer check 12-01-2013   08-29-2013  for symptomatic bradycardia and mobitz 2 second degree heart block   Symptomatic bradycardia    s/p  pacemaker placement 08-29-2013    PAST SURGICAL HISTORY: Past Surgical History:  Procedure Laterality Date   APPENDECTOMY  02/1988   CATARACT EXTRACTION W/ INTRAOCULAR LENS  IMPLANT, BILATERAL  2011   CORONARY ANGIOPLASTY WITH STENT PLACEMENT  2006    [facility]   X2 STENTS  TO RCA   CYSTOSCOPY W/ RETROGRADES Right 01/13/2014   Procedure: CYSTOSCOPY WITH RETROGRADE PYELOGRAM;  Surgeon: [name], MD;  Location: [facility];  Service: Urology;  Laterality: Right;   CYSTOSCOPY W/ URETERAL STENT PLACEMENT Left 08/22/2013   Procedure: CYSTOSCOPY WITH RETROGRADE PYELOGRAM/URETERAL STENT PLACEMENT;  Surgeon: [name], MD;  Location: [facility];  Service: Urology;  Laterality: Left;   CYSTOSCOPY WITH BIOPSY  04/13/2011     Procedure: CYSTOSCOPY WITH BIOPSY;  Surgeon: [name], MD;  Location: [facility];  Service: Urology;  Laterality: N/A;  Cold Cup Biopsy   CYSTOSCOPY WITH RETROGRADE PYELOGRAM, URETEROSCOPY AND STENT PLACEMENT Left 06/19/2013   Procedure: CYSTOSCOPY WITH LEFT RETROGRADE PYELOGRAM, LEFT URETEROSCOPY, ureteral balloon dilatation, BIOPSY LEFT KIDNEY;  Surgeon: [name], MD;   Location: [facility];  Service: Urology;  Laterality: Left;   INGUINAL HERNIA REPAIR Left 08/22/2013   Procedure: LAPAROSCOPIC INGUINAL HERNIA;  Surgeon: [name], MD;  Location: [facility];  Service: Urology;  Laterality: Left;   PERMANENT PACEMAKER INSERTION N/A 08/29/2013   Procedure: PERMANENT PACEMAKER INSERTION;  Surgeon: [name], MD;  Location: [facility];  Service: Cardiovascular;  Laterality: N/A;  Medtronic dual-chamber   ROBOT ASSITED LAPAROSCOPIC NEPHROURETERECTOMY Left 08/22/2013   Procedure: ROBOT ASSITED LAPAROSCOPIC NEPHROURETERECTOMY;  Surgeon: [name], MD;  Location: [facility];  Service: Urology;  Laterality: Left;   TRANSTHORACIC ECHOCARDIOGRAM  08-29-2013   mild LVH/  ef 60-65% /  grade I diastolic dysfunction/  mild MR/  mild LAE  and RAE/  moderate TR   TRANSURETHRAL RESECTION OF BLADDER TUMOR  12-22-2010   TRANSURETHRAL RESECTION OF BLADDER TUMOR  08/04/2011   Procedure: TRANSURETHRAL RESECTION OF BLADDER TUMOR (TURBT);  Surgeon: [name], MD;  Location: [facility];  Service: Urology;  Laterality: N/A;   TRANSURETHRAL RESECTION OF BLADDER TUMOR N/A 01/13/2014   Procedure: TRANSURETHRAL RESECTION OF BLADDER TUMOR (TURBT);  Surgeon: [name], MD;  Location: [facility];  Service: Urology;  Laterality: N/A;    FAMILY HISTORY: Family History  Problem Relation Age of Onset   Heart attack Mother    Heart attack Father    Prostate cancer Neg Hx    Bladder Cancer Neg Hx    Kidney cancer Neg Hx     ADVANCED DIRECTIVES (Y/N):  N  HEALTH MAINTENANCE: Social History   Tobacco Use   Smoking status: Former Smoker    Packs/day: 1.00    Years: 50.00    Pack years: 50.00    Types: Cigarettes    Last attempt to quit: 11/02/2000    Years since quitting: 17.3   Smokeless tobacco: Never Used  Substance Use Topics   Alcohol use: No   Drug use: No     Colonoscopy:  PAP:  Bone  density:  Lipid panel:  Allergies  Allergen Reactions   Penicillins Hives    BLISTERS    Current Outpatient Medications  Medication Sig Dispense Refill   acetaminophen (TYLENOL) 325 MG tablet Take 650 mg by mouth every 4 (four) hours as needed.     aspirin 81 MG tablet Take 81 mg by mouth daily.     cetirizine (ZYRTEC) 10 MG tablet Take 10 mg by mouth daily.     DEXILANT 60 MG capsule Take 60 mg by mouth daily.      furosemide (LASIX) 20 MG tablet Take 20 mg by mouth daily as needed.      isosorbide mononitrate (IMDUR) 30 MG 24 hr tablet Take 30 mg by mouth at bedtime.      latanoprost (XALATAN) 0.005 % ophthalmic solution Place 1 drop into both eyes at bedtime.      losartan (COZAAR) 50 MG tablet Take 1 tablet by mouth every morning.      lovastatin (MEVACOR) 20 MG tablet Take 20 mg  by mouth at bedtime.      metoprolol tartrate (LOPRESSOR) 25 MG tablet Take 12.5 mg by mouth every 12 (twelve) hours.     Multiple Vitamins-Minerals (MULTIVITAMINS THER. W/MINERALS) TABS Take 1 tablet by mouth daily.      Omega-3 Fatty Acids (FISH OIL) 1200 MG CPDR Take by mouth.     PROAIR HFA 108 (90 Base) MCG/ACT inhaler Inhale 1 puff into the lungs every 4 (four) hours as needed.      senna-docusate (SENOKOT-S) 8.6-50 MG per tablet Take 1 tablet by mouth 2 (two) times daily. While taking pain meds to prevent constipation 30 tablet 0   SYMBICORT 160-4.5 MCG/ACT inhaler INHALE 2 PUFFS INTO LUNGS TWICE DAILY. RINSE MOUTH AFTER USE. 11 g 0   No current facility-administered medications for this visit.     OBJECTIVE: There were no vitals filed for this visit.   There is no height or weight on file to calculate BMI.    ECOG FS:0 - Asymptomatic  General: Well-developed, well-nourished, no acute distress. Eyes: Pink conjunctiva, anicteric sclera. HEENT: Normocephalic, moist mucous membranes. Lungs: Clear to auscultation bilaterally. Heart: Regular rate and rhythm. No rubs, murmurs, or  gallops. Abdomen: Soft, nontender, nondistended. No organomegaly noted, normoactive bowel sounds. Musculoskeletal: No edema, cyanosis, or clubbing. Neuro: Alert, answering all questions appropriately. Cranial nerves grossly intact. Skin: No rashes or petechiae noted. Psych: Normal affect.  LAB RESULTS:  Lab Results  Component Value Date   NA 141 03/27/2015   K 4.4 03/27/2015   CL 106 03/27/2015   CO2 31 03/27/2015   GLUCOSE 146 (H) 03/27/2015   BUN 30 (H) 03/27/2015   CREATININE 2.30 (H) 03/12/2018   CALCIUM 8.4 (L) 03/27/2015   PROT 7.6 07/07/2011   ALBUMIN 3.6 07/07/2011   AST 23 07/07/2011   ALT 23 07/07/2011   ALKPHOS 69 07/07/2011   BILITOT 0.5 07/07/2011   GFRNONAA 32 (L) 03/27/2015   GFRAA 38 (L) 03/27/2015    Lab Results  Component Value Date   WBC 7.1 03/27/2015   NEUTROABS 5.1 03/27/2015   HGB 12.0 (L) 03/27/2015   HCT 36.9 (L) 03/27/2015   MCV 93.2 03/27/2015   PLT 237 03/27/2015     STUDIES: Ct Chest Wo Contrast  Result Date: 03/12/2018 CLINICAL DATA:  Follow-up left lung nodule. Chronic cough, shortness of breath. EXAM: CT CHEST WITHOUT CONTRAST TECHNIQUE: Multidetector CT imaging of the chest was performed following the standard protocol without IV contrast. COMPARISON:  PET-CT dated 09/24/2017. FINDINGS: Cardiovascular: Heart is normal in size.  No pericardial effusion. No evidence of thoracic aortic aneurysm. Atherosclerotic calcifications of the aortic arch. Three vessel coronary atherosclerosis. Left subclavian pacemaker. Mediastinum/Nodes: Small mediastinal lymph nodes, including 11 mm short axis subcarinal node, non FDG avid. Visualized thyroid is unremarkable. Lungs/Pleura: Platelike scarring/radiation changes in the lingula. No underlying nodule is evident on CT. Mild centrilobular and paraseptal emphysematous changes, upper lobe predominant. No focal consolidation. Stable 3 mm subpleural right lower lobe nodule (series 3/image 110), benign. No pleural  effusion or pneumothorax. Upper Abdomen: Visualized upper abdomen is notable for prior left nephrectomy and radiation changes. Musculoskeletal: Degenerative changes of the visualized thoracolumbar spine. IMPRESSION: Scarring/radiation changes in the lingula. No evidence of recurrent or metastatic disease. Aortic Atherosclerosis (ICD10-I70.0) and Emphysema (ICD10-J43.9). Electronically Signed   By: Julian Hy M.D.   On: 03/12/2018 12:56    ASSESSMENT: Nodule of the left lung.  PLAN:    1. Nodule of the left lung: No evidence of recurrent or  progressive disease.  PET scan results from September 24, 2017 reviewed independently and reported as above with resolution of lingular nodule.  Patient completed his XRT in July 2019.  No intervention is needed at this time.  Return to clinic in 6 months with repeat imaging and further evaluation.   2.  Left upper tract urothelial carcinoma: Continue follow-up with urology and surveillance cystoscopies as scheduled.  I spent a total of 20 minutes face-to-face with the patient of which greater than 50% of the visit was spent in counseling and coordination of care as detailed above.   Patient expressed understanding and was in agreement with this plan. He also understands that He can call clinic at any time with any questions, concerns, or complaints.   Cancer Staging No matching staging information was found for the patient.  Lloyd Huger, MD   03/18/2018 7:37 AM

## 2018-05-01 ENCOUNTER — Other Ambulatory Visit: Payer: Self-pay

## 2018-05-13 ENCOUNTER — Other Ambulatory Visit: Payer: Self-pay

## 2018-05-15 ENCOUNTER — Encounter: Payer: Self-pay | Admitting: Urology

## 2018-05-15 ENCOUNTER — Other Ambulatory Visit: Payer: Self-pay

## 2018-05-15 ENCOUNTER — Ambulatory Visit (INDEPENDENT_AMBULATORY_CARE_PROVIDER_SITE_OTHER): Payer: Medicare Other | Admitting: Urology

## 2018-05-15 VITALS — BP 163/78 | HR 80 | Ht 66.0 in | Wt 202.0 lb

## 2018-05-15 DIAGNOSIS — Z8551 Personal history of malignant neoplasm of bladder: Secondary | ICD-10-CM

## 2018-05-15 DIAGNOSIS — R972 Elevated prostate specific antigen [PSA]: Secondary | ICD-10-CM

## 2018-05-15 LAB — URINALYSIS, COMPLETE
Bilirubin, UA: NEGATIVE
Glucose, UA: NEGATIVE
Ketones, UA: NEGATIVE
Leukocytes,UA: NEGATIVE
Nitrite, UA: NEGATIVE
Protein,UA: NEGATIVE
Specific Gravity, UA: 1.015 (ref 1.005–1.030)
Urobilinogen, Ur: 0.2 mg/dL (ref 0.2–1.0)
pH, UA: 7 (ref 5.0–7.5)

## 2018-05-15 LAB — MICROSCOPIC EXAMINATION: Bacteria, UA: NONE SEEN

## 2018-05-15 NOTE — Progress Notes (Signed)
05/15/18  CC:  Chief Complaint  Patient presents with  . Cysto    HPI: 85 year old male with personal history of left upper tract urothelial carcinoma as well as bladder cancer who returns for routine surveillance.  Additionally, he has a rising PSA.     1 - Left Renal Pelvis Cancer - s/p left nephroureterectomy 08/2013 for large volume pTaG3 renal pelvis cancer with negative margins by Dr. [name]. He has an incisional/periumbilical hernia.  Post-op Surveillance: 01/2014 - Cysto small left wall bladder tumor (about 2.5cm) / CT No bulky disesae --> TaG3 by TURBT 01/2015 - Cysto / CT NED 02/2016 CT negative apart from 7 mm pulm nodule  05/2016 following with Dr. [name] for pulm nodule--> XRT 07/2017 09/2017  CT PET negative for mets  2 - Bladder Cancer -  2012 - TaG3 by TURBT 2013 - Induction BCG x 6 2014 - NED by surv cystos 01/2014 - TaG3 by TURBT --> Re-Induction BCG x6; 04/2014 Cysto NED; 07/2014 Cysto NED 01/2015 - Cysto / CTNED 02/2016 CT 02/2016 CT negative apart from 7 mm pulm nodule; normal cystoscopy  05/2017- NED 11/2017- NED  3- elevated PSA - pt PSA was 4.5 with Dr. Rosario Jacks Jan 2018. No h/o BPH. No FH PCa. Denies prior PSA elevation. DRE Jan 2018 showed a 50 + gram prostate with some subtle induration right base. Prostate measured 67 grams on Jan 2018 CT. Repeat PSA 05/2016 4.8.   Patchy nonfocal update on PET 09/2017.  PSA 8.111/2019 and most recently 10.6 04/2018 as checked by PCP.  4- Renal Insufficiency - solitary kidney s/p nephroureterectomy as per above. Cr post-op 2-2.5 range.    Blood pressure (!) 156/74, pulse 80, height 5\' 9"  (1.753 m), weight 200 lb (90.7 kg). NED. A&Ox3.   No respiratory distress   Abd soft, NT, ND Normal phallus with bilateral descended testicles Rectal exam with nodularity present at the left base, highly suspicious  Cystoscopy Procedure Note  Patient identification was confirmed, informed consent was obtained, and patient was prepped  using Betadine solution.  Lidocaine jelly was administered per urethral meatus.    Preoperative abx where received prior to procedure.     Pre-Procedure: - Inspection reveals a normal caliber ureteral meatus.  Procedure: The flexible cystoscope was introduced without difficulty - No urethral strictures/lesions are present. - Enlarged prostate with trilobar coaptation - Normal bladder neck -Right UO with clear reflux of urine, left UO surgically absent - Bladder mucosa  reveals no ulcers, tumors, or lesions other than stellate scar and posterior bladder wall - No bladder stones - No trabeculation  Retroflexion shows unremarkable other than small median lobe   Post-Procedure: - Patient tolerated the procedure well  Assessment/ Plan:  1. History of bladder cancer Extensive history of upper tract urothelial bladder cancer No evidence of recurrence today on cystoscopy - Urinalysis, Complete  2. Elevated PSA/ abnormal rectal exam Abnormal rectal exam and steadily rising PSA highly suspicious for prostate cancer with a doubling time around 2 years At this point, does seem reasonable to pursue prostate biopsy in order to determine whether to pursue palliation with Lupron Given his age and comorbidities, would not recommend definitive management Alternatives including prostate MRI were reviewed, however given his renal impairment, he would not be able to receive contrast thus the evaluation would be more limited and may ultimately result in the need for prostate biopsy regardless We discussed prostate biopsy in detail including the procedure itself, the risks of blood in the urine,  stool, and ejaculate, serious infection, and discomfort. He is willing to proceed with this as discussed.  He does take a baby aspirin every day, has a history of cardiac stents and will need cardiac clearance to hold his 81 mg of aspirin by his cardiologist, Dr. Caryl Comes.  Return for schedule prostate biospy.    [name], MD

## 2018-05-16 NOTE — Patient Instructions (Signed)

## 2018-05-17 ENCOUNTER — Telehealth: Payer: Self-pay

## 2018-05-17 ENCOUNTER — Other Ambulatory Visit: Payer: Self-pay

## 2018-05-17 ENCOUNTER — Ambulatory Visit (INDEPENDENT_AMBULATORY_CARE_PROVIDER_SITE_OTHER): Payer: Medicare Other | Admitting: *Deleted

## 2018-05-17 DIAGNOSIS — I441 Atrioventricular block, second degree: Secondary | ICD-10-CM

## 2018-05-17 NOTE — Telephone Encounter (Signed)
    [facility] Pre-operative Risk Assessment    Request for surgical clearance:  1. What type of surgery is being performed? Prostate Biopsy  2. When is this surgery scheduled? 06/04/18   3. What type of clearance is required (medical clearance vs. Pharmacy clearance to hold med vs. Both)? Medical  4. Are there any medications that need to be held prior to surgery and how long?   5. Practice name and name of physician performing surgery? [facility]   6. What is your office phone number [phone]    7.   What is your office fax number          [phone]  8.   Anesthesia type (None, local, MAC, general) ?

## 2018-05-17 NOTE — Telephone Encounter (Signed)
Spoke with patient to remind of missed remote transmission 

## 2018-05-17 NOTE — Telephone Encounter (Addendum)
   Primary Cardiologist: Dr. Caryl Comes  Chart reviewed as part of pre-operative protocol coverage. Patient was contacted 05/17/2018 in reference to pre-operative risk assessment for pending surgery as outlined below.  Jacob [name] was last seen on 08/23/2017 by Dr. Caryl Comes.    Per Pecolia Ades, NP:  Since that day, Jacob [name] has done well with no new cardiac complaints, no chest pain or shortness of breath. He has a pacemaker for symptomatic bradycardia and 2:1 heart block.  PPM interrogation on 05/17/2018 with normal device function.  Therefore, based on ACC/AHA guidelines, the patient would be at acceptable risk for the planned procedure without further cardiovascular testing.   Once this note is complete we will route this recommendation to the requesting party via Epic fax function and remove from pre-op pool.  Please call with questions.  Daune Perch, NP 05/17/2018, 1:24 PM

## 2018-05-18 LAB — CUP PACEART REMOTE DEVICE CHECK
Battery Impedance: 374 Ohm
Battery Remaining Longevity: 88 mo
Battery Voltage: 2.79 V
Brady Statistic AP VP Percent: 71 %
Brady Statistic AP VS Percent: 1 %
Brady Statistic AS VP Percent: 27 %
Brady Statistic AS VS Percent: 1 %
Date Time Interrogation Session: 20200515155615
Implantable Lead Implant Date: 20150903
Implantable Lead Implant Date: 20150903
Implantable Lead Location: 753859
Implantable Lead Location: 753860
Implantable Lead Model: 5076
Implantable Lead Model: 5076
Implantable Pulse Generator Implant Date: 20150903
Lead Channel Impedance Value: 464 Ohm
Lead Channel Impedance Value: 517 Ohm
Lead Channel Pacing Threshold Amplitude: 0.75 V
Lead Channel Pacing Threshold Amplitude: 0.75 V
Lead Channel Pacing Threshold Pulse Width: 0.4 ms
Lead Channel Pacing Threshold Pulse Width: 0.4 ms
Lead Channel Setting Pacing Amplitude: 2 V
Lead Channel Setting Pacing Amplitude: 2.5 V
Lead Channel Setting Pacing Pulse Width: 0.4 ms
Lead Channel Setting Sensing Sensitivity: 2.8 mV

## 2018-05-20 ENCOUNTER — Encounter: Payer: Self-pay | Admitting: Cardiology

## 2018-05-20 NOTE — Progress Notes (Signed)
Remote pacemaker transmission.   

## 2018-06-04 ENCOUNTER — Other Ambulatory Visit: Payer: Self-pay

## 2018-06-04 ENCOUNTER — Ambulatory Visit (INDEPENDENT_AMBULATORY_CARE_PROVIDER_SITE_OTHER): Payer: Medicare Other | Admitting: Urology

## 2018-06-04 ENCOUNTER — Other Ambulatory Visit: Payer: Self-pay | Admitting: Urology

## 2018-06-04 ENCOUNTER — Encounter: Payer: Self-pay | Admitting: Urology

## 2018-06-04 VITALS — BP 189/72 | HR 76 | Ht 66.0 in | Wt 200.0 lb

## 2018-06-04 DIAGNOSIS — R972 Elevated prostate specific antigen [PSA]: Secondary | ICD-10-CM | POA: Diagnosis not present

## 2018-06-04 MED ORDER — GENTAMICIN SULFATE 40 MG/ML IJ SOLN: 80.0000 mg | Freq: Once | INTRAMUSCULAR | Status: DC

## 2018-06-04 MED ORDER — LEVOFLOXACIN 500 MG PO TABS: 500.0000 mg | ORAL_TABLET | Freq: Once | ORAL | Status: DC

## 2018-06-04 NOTE — Progress Notes (Signed)
   06/04/18  CC:  Chief Complaint  Patient presents with  . Prostate Biopsy    HPI: 85 year old male with a personal history of left upper tract urothelial/bladder cancer and now elevated and rising PSA/abnormal rectal exam who presents today for prostate biopsy.  In the interim, he has been able to hold his aspirin and is ready for prostate biopsy today.  Please see previous notes for details.  Blood pressure (!) 189/72, pulse 76, height 5\' 6"  (1.676 m), weight 200 lb (90.7 kg). NED. A&Ox3.   No respiratory distress   Abd soft, NT, ND Normal sphincter tone  Prostate Biopsy Procedure   Informed consent was obtained after discussing risks/benefits of the procedure.  A time out was performed to ensure correct patient identity.  Pre-Procedure: - Gentamicin given prophylactically - Levaquin 500 mg administered PO -Transrectal Ultrasound performed revealing a 52 gm prostate -No significant median lobe, however the patient did have an irregular area with a discretely different echotexture on the right side measuring approximately 1.5 cm x 2 cm near the mid and apex of the gland.  This did not appear to be discretely fluid such as an abscess but perhaps necrotic tissue as compared with the remainder of the prostate.  Notably in addition to this on biopsy of this area, the tissue sampled in this location was almost liquefactive but not purulent and additional biopsies were taken due to the smaller volume of solid tissue at these locations.  Procedure: - Prostate block performed using 10 cc 1% lidocaine and biopsies taken from sextant areas, a total of 12 under ultrasound guidance.  Post-Procedure: - Patient tolerated the procedure well - He was counseled to seek immediate medical attention if experiences any severe pain, significant bleeding, or fevers - Return in two week to discuss biopsy results (prefers virtual encounter)  [name], MD     No follow-ups on file.  [name], MD

## 2018-06-11 ENCOUNTER — Other Ambulatory Visit: Payer: Self-pay | Admitting: Urology

## 2018-06-11 LAB — PATHOLOGY REPORT

## 2018-06-18 ENCOUNTER — Telehealth: Payer: Medicare Other | Admitting: Urology

## 2018-06-20 ENCOUNTER — Telehealth (INDEPENDENT_AMBULATORY_CARE_PROVIDER_SITE_OTHER): Payer: Medicare Other | Admitting: Urology

## 2018-06-20 ENCOUNTER — Other Ambulatory Visit: Payer: Self-pay

## 2018-06-20 DIAGNOSIS — Z8551 Personal history of malignant neoplasm of bladder: Secondary | ICD-10-CM

## 2018-06-20 DIAGNOSIS — C61 Malignant neoplasm of prostate: Secondary | ICD-10-CM | POA: Diagnosis not present

## 2018-06-20 NOTE — Progress Notes (Signed)
Virtual Visit via Telephone Note  I connected with Jacob [name] on 06/20/18 at 11:30 AM EDT by telephone and verified that I am speaking with the correct person using two identifiers.  Location: Patient: Home Provider: Office   I discussed the limitations, risks, security and privacy concerns of performing an evaluation and management service by telephone and the availability of in person appointments. I also discussed with the patient that there may be a patient responsible charge related to this service. The patient expressed understanding and agreed to proceed.   History of Present Illness: 85 year old male with multiple GU issues who presents today to discuss newly diagnosed prostate cancer.  Notably, the patient was noted to have a rising PSA.  Risen from 4.5 in 2018 most recently up to 10.6 on 4/20 with a nodular prostate on exam.  In addition to this, he had patchy nonfocal uptake on PET on 09/2017.  He underwent prostate biopsy on 06/04/2018 and returns today via telephone visit to discuss biopsy results.  Biopsy shows 7 of 12 cores positive for prostate cancer involving all cores on the left up to 100% of Gleason 4+5 and 4+3 as well as a single core of Gleason 3+3 at the right lateral base.  He denies any weight loss or bone pain.  He has relatively few voiding symptoms.  He does have multiple GU issues including a personal history of upper tract urothelial cancer as well as bladder cancer.  Please see previous notes for details.  His last surveillance cystoscopy was last month without evidence of recurrent disease.    Observations/Objective: Pleasant, no acute distress, asking appropriate questions  Assessment and Plan:  1. Prostate cancer [facility]) Lengthy discussion today about newly diagnosed high risk prostate cancer, T2b, PSA 10.  Given his age and comorbidities, his life expectancy is less than 10 years and as such, role of definitive treatment such as IMRT is somewhat  limited and not standard of care based on guidelines.  We discussed alternatives including watchful waiting versus systemic treatment with ADT.  We discussed the role of staging imaging and at this point with a relatively low PSA and essentially negative PET scan within the past year, this will not likely change treatment thus after shared decision making, the patient has elected to decline further staging.  We discussed ADT at length today.  We discussed the possible side effects including hot flashes, weight gain, loss of muscle mass, impact on bone density as well as long-term cardiac issues related to this medication.  He is interested in pursuing treatment in the form of hormonal suppression and would like to start ADT in the near future.  We will plan to get prior authorization for this medication have him come in for nurse injection.  We discussed bone health today and I have encouraged him to take supplemental calcium and vitamin D which he is agreed to.  Plan to check PSA at his next follow-up in November.  He is agreeable this plan.  2. History of bladder cancer For surveillance cystoscopy in 11/2018  Follow Up Instructions: Schedule Lupron depo (6 mo shot) and f/u in 11/2018 for cysto/ PSA   I discussed the assessment and treatment plan with the patient. The patient was provided an opportunity to ask questions and all were answered. The patient agreed with the plan and demonstrated an understanding of the instructions.   The patient was advised to call back or seek an in-person evaluation if the symptoms worsen or if  the condition fails to improve as anticipated.  I provided 21 minutes of non-face-to-face time during this encounter.   [name], MD

## 2018-07-08 ENCOUNTER — Telehealth: Payer: Self-pay | Admitting: Urology

## 2018-07-08 NOTE — Telephone Encounter (Signed)
Patient needs to get scheduled for a Lupron shot, make sure no PA is required. It looks like the note was put in his November appt for a shot but this needs to be done now ,thanks

## 2018-07-08 NOTE — Telephone Encounter (Signed)
Pt called and wants to know what the plan of care is for his diagnosis of prostate cancer. Please advise.

## 2018-07-17 ENCOUNTER — Telehealth: Payer: Self-pay | Admitting: Urology

## 2018-07-17 NOTE — Telephone Encounter (Signed)
PA for medication [id number] 07-17-18 thru 07-17-2019 FOR LUPRON

## 2018-07-18 ENCOUNTER — Encounter: Payer: Self-pay | Admitting: Urology

## 2018-07-18 ENCOUNTER — Ambulatory Visit (INDEPENDENT_AMBULATORY_CARE_PROVIDER_SITE_OTHER): Payer: Medicare Other | Admitting: Urology

## 2018-07-18 ENCOUNTER — Other Ambulatory Visit: Payer: Self-pay

## 2018-07-18 VITALS — BP 166/72 | HR 85 | Ht 66.0 in | Wt 201.0 lb

## 2018-07-18 DIAGNOSIS — C61 Malignant neoplasm of prostate: Secondary | ICD-10-CM

## 2018-07-18 MED ORDER — LEUPROLIDE ACETATE (6 MONTH) 45 MG IM KIT
45.0000 mg | PACK | Freq: Once | INTRAMUSCULAR | Status: AC
  Administered 2018-07-18: 45 mg via INTRAMUSCULAR

## 2018-07-18 NOTE — Patient Instructions (Signed)
Please begin vitamin D 600iu and Calcium 1200iu while on Lupron therapy   Leuprolide injection What is this medicine? LEUPROLIDE (loo PROE lide) is a man-made hormone. It is used to treat the symptoms of prostate cancer. This medicine may also be used to treat children with early onset of puberty. It may be used for other hormonal conditions. This medicine may be used for other purposes; ask your health care provider or pharmacist if you have questions. COMMON BRAND NAME(S): Lupron What should I tell my health care provider before I take this medicine? They need to know if you have any of these conditions:  diabetes  heart disease or previous heart attack  high blood pressure  high cholesterol  pain or difficulty passing urine  spinal cord metastasis  stroke  tobacco smoker  an unusual or allergic reaction to leuprolide, benzyl alcohol, other medicines, foods, dyes, or preservatives  pregnant or trying to get pregnant  breast-feeding How should I use this medicine? This medicine is for injection under the skin or into a muscle. You will be taught how to prepare and give this medicine. Use exactly as directed. Take your medicine at regular intervals. Do not take your medicine more often than directed. It is important that you put your used needles and syringes in a special sharps container. Do not put them in a trash can. If you do not have a sharps container, call your pharmacist or healthcare provider to get one. A special MedGuide will be given to you by the pharmacist with each prescription and refill. Be sure to read this information carefully each time. Talk to your pediatrician regarding the use of this medicine in children. While this medicine may be prescribed for children as young as 8 years for selected conditions, precautions do apply. Overdosage: If you think you have taken too much of this medicine contact a poison control center or emergency room at once. NOTE: This  medicine is only for you. Do not share this medicine with others. What if I miss a dose? If you miss a dose, take it as soon as you can. If it is almost time for your next dose, take only that dose. Do not take double or extra doses. What may interact with this medicine? Do not take this medicine with any of the following medications:  chasteberry This medicine may also interact with the following medications:  herbal or dietary supplements, like black cohosh or DHEA  male hormones, like estrogens or progestins and birth control pills, patches, rings, or injections  male hormones, like testosterone This list may not describe all possible interactions. Give your health care provider a list of all the medicines, herbs, non-prescription drugs, or dietary supplements you use. Also tell them if you smoke, drink alcohol, or use illegal drugs. Some items may interact with your medicine. What should I watch for while using this medicine? Visit your doctor or health care professional for regular checks on your progress. During the first week, your symptoms may get worse, but then will improve as you continue your treatment. You may get hot flashes, increased bone pain, increased difficulty passing urine, or an aggravation of nerve symptoms. Discuss these effects with your doctor or health care professional, some of them may improve with continued use of this medicine. Male patients may experience a menstrual cycle or spotting during the first 2 months of therapy with this medicine. If this continues, contact your doctor or health care professional. This medicine may increase blood  sugar. Ask your healthcare provider if changes in diet or medicines are needed if you have diabetes. What side effects may I notice from receiving this medicine? Side effects that you should report to your doctor or health care professional as soon as possible:  allergic reactions like skin rash, itching or hives, swelling  of the face, lips, or tongue  breathing problems  chest pain  depression or memory disorders  pain in your legs or groin  pain at site where injected  severe headache  signs and symptoms of high blood sugar such as being more thirsty or hungry or having to urinate more than normal. You may also feel very tired or have blurry vision  swelling of the feet and legs  visual changes  vomiting Side effects that usually do not require medical attention (report to your doctor or health care professional if they continue or are bothersome):  breast swelling or tenderness  decrease in sex drive or performance  diarrhea  hot flashes  loss of appetite  muscle, joint, or bone pains  nausea  redness or irritation at site where injected  skin problems or acne This list may not describe all possible side effects. Call your doctor for medical advice about side effects. You may report side effects to FDA at 1-800-FDA-1088. Where should I keep my medicine? Keep out of the reach of children. Store below 25 degrees C (77 degrees F). Do not freeze. Protect from light. Do not use if it is not clear or if there are particles present. Throw away any unused medicine after the expiration date. NOTE: This sheet is a summary. It may not cover all possible information. If you have questions about this medicine, talk to your doctor, pharmacist, or health care provider.  2020 Elsevier/Gold Standard (2017-10-18 09:52:48)

## 2018-07-18 NOTE — Progress Notes (Signed)
Lupron IM Injection   Due to Prostate Cancer patient is present today for a Lupron Injection.  Medication: Lupron 6 month Dose: 45 mg  Location: left upper outer buttocks Lot: [id number] Exp: 08/13/2020  Patient tolerated well, no complications were noted  Performed by: Gordy Clement, CMA   Follow up: As Scheduled

## 2018-07-18 NOTE — Telephone Encounter (Signed)
lupron approved and app made   [facility]

## 2018-07-18 NOTE — Progress Notes (Signed)
Patient was scheduled with provider today regarding his first Lupron injection.  He had a recent phone visit with Dr. [name] who discussed Lupron and side effects in detail.  He had no further questions today and will keep [place] schedule follow-up with Dr. [name].

## 2018-08-16 ENCOUNTER — Ambulatory Visit (INDEPENDENT_AMBULATORY_CARE_PROVIDER_SITE_OTHER): Payer: Medicare Other | Admitting: *Deleted

## 2018-08-16 DIAGNOSIS — I441 Atrioventricular block, second degree: Secondary | ICD-10-CM

## 2018-08-16 DIAGNOSIS — I447 Left bundle-branch block, unspecified: Secondary | ICD-10-CM

## 2018-08-16 LAB — CUP PACEART REMOTE DEVICE CHECK
Battery Impedance: 447 Ohm
Battery Remaining Longevity: 84 mo
Battery Voltage: 2.78 V
Brady Statistic AP VP Percent: 69 %
Brady Statistic AP VS Percent: 0 %
Brady Statistic AS VP Percent: 29 %
Brady Statistic AS VS Percent: 1 %
Date Time Interrogation Session: 20200814114055
Implantable Lead Implant Date: 20150903
Implantable Lead Implant Date: 20150903
Implantable Lead Location: 753859
Implantable Lead Location: 753860
Implantable Lead Model: 5076
Implantable Lead Model: 5076
Implantable Pulse Generator Implant Date: 20150903
Lead Channel Impedance Value: 499 Ohm
Lead Channel Impedance Value: 560 Ohm
Lead Channel Pacing Threshold Amplitude: 0.75 V
Lead Channel Pacing Threshold Amplitude: 0.875 V
Lead Channel Pacing Threshold Pulse Width: 0.4 ms
Lead Channel Pacing Threshold Pulse Width: 0.4 ms
Lead Channel Setting Pacing Amplitude: 2 V
Lead Channel Setting Pacing Amplitude: 2.5 V
Lead Channel Setting Pacing Pulse Width: 0.4 ms
Lead Channel Setting Sensing Sensitivity: 2.8 mV

## 2018-08-23 NOTE — Progress Notes (Signed)
Remote pacemaker transmission.   

## 2018-09-06 NOTE — Progress Notes (Signed)
[facility]  Telephone:([phone]) [phone] [phone]) [phone]  ID: Jacob [name] OB: Oct 14, 1928  MR#: [id number]  IOX#:[id number]  Patient Care Team: [name], MD as PCP - General (Internal Medicine)  CHIEF COMPLAINT: Nodule of the left lung.  INTERVAL HISTORY: Patient returns to clinic today for routine 41-month evaluation and discussion of his imaging results.  He currently feels well and is asymptomatic. He has no neurologic complaints. He denies any recent fevers or illnesses. He has a good appetite and denies weight loss.  He denies any chest pain, shortness of breath, cough, or hemoptysis.  He has no nausea, vomiting, constipation, or diarrhea. He has no new urinary complaints.  Patient feels at his baseline and offers no specific complaints today.  REVIEW OF SYSTEMS:   Review of Systems  Constitutional: Negative.  Negative for fever, malaise/fatigue and weight loss.  Respiratory: Negative.  Negative for cough, hemoptysis and shortness of breath.   Cardiovascular: Negative.  Negative for chest pain and leg swelling.  Gastrointestinal: Negative.  Negative for abdominal pain.  Genitourinary: Negative.  Negative for dysuria.  Musculoskeletal: Negative.  Negative for back pain.  Skin: Negative.  Negative for rash.  Neurological: Negative.  Negative for dizziness, sensory change, focal weakness, weakness and headaches.  Psychiatric/Behavioral: Negative.  The patient is not nervous/anxious.     As per HPI. Otherwise, a complete review of systems is negative.  PAST MEDICAL HISTORY: Past Medical History:  Diagnosis Date  . Arthritis    hands  . At risk for sleep apnea    STOP-BANG=  4     SENT TO PCP [id number]  . BPH (benign prostatic hypertrophy)   . Coronary artery disease cardiologist-- dr Fletcher Anon  and dr gregg taylor   a. 2006 s/p PCI/Stenting x 2 to the RCA;  b. 05/03/12 Cardiac CT: Patent RCA stent, nonobs dzs;  c. 01/2013 Ex MV: nl EF, no ischemia.  Marland Kitchen GERD  (gastroesophageal reflux disease)   . Glaucoma of both eyes   . H/O hiatal hernia   . History of atrial fibrillation    a. 2006.  Marland Kitchen History of diverticulitis   . History of kidney stones   . History of renal cell carcinoma    08-22-2013  s/p  left nephrouretectomy  . Hyperlipidemia   . Hyperlipidemia   . Hypertension   . LBBB (left bundle branch block)   . Mobitz type 2 second degree AV block   . Recurrent bladder transitional cell carcinoma ([place]) monitored by dr [name]   s/p turb's  and chemo bladder instillations  . Renal insufficiency    a. Creat rose to 2.34 post-op L nephrectomy.  . S/P placement of cardiac pacemaker MEDTRONIC last pacer check 12-01-2013   08-29-2013  for symptomatic bradycardia and mobitz 2 second degree heart block  . Symptomatic bradycardia    s/p  pacemaker placement 08-29-2013    PAST SURGICAL HISTORY: Past Surgical History:  Procedure Laterality Date  . APPENDECTOMY  02/1988  . CATARACT EXTRACTION W/ INTRAOCULAR LENS  IMPLANT, BILATERAL  2011  . CORONARY ANGIOPLASTY WITH STENT PLACEMENT  2006    [facility]   X2 STENTS  TO RCA  . CYSTOSCOPY W/ RETROGRADES Right 01/13/2014   Procedure: CYSTOSCOPY WITH RETROGRADE PYELOGRAM;  Surgeon: [name], MD;  Location[facility];  Service: Urology;  Laterality: Right;  . CYSTOSCOPY W/ URETERAL STENT PLACEMENT Left 08/22/2013   Procedure: CYSTOSCOPY WITH RETROGRADE PYELOGRAM/URETERAL STENT PLACEMENT;  Surgeon: [name], MD;  Location:  [facility];  Service: Urology;  Laterality: Left;  . CYSTOSCOPY WITH BIOPSY  04/13/2011     Procedure: CYSTOSCOPY WITH BIOPSY;  Surgeon: [name], MD;  Location: [facility];  Service: Urology;  Laterality: N/A;  Cold Cup Biopsy  . CYSTOSCOPY WITH RETROGRADE PYELOGRAM, URETEROSCOPY AND STENT PLACEMENT Left 06/19/2013   Procedure: CYSTOSCOPY WITH LEFT RETROGRADE PYELOGRAM, LEFT URETEROSCOPY, ureteral balloon dilatation, BIOPSY LEFT KIDNEY;  Surgeon: [name], MD;  Location: [facility];  Service: Urology;  Laterality: Left;  . INGUINAL HERNIA REPAIR Left 08/22/2013   Procedure: LAPAROSCOPIC INGUINAL HERNIA;  Surgeon: [name], MD;  Location: [facility];  Service: Urology;  Laterality: Left;  . PERMANENT PACEMAKER INSERTION N/A 08/29/2013   Procedure: PERMANENT PACEMAKER INSERTION;  Surgeon: [name], MD;  Location: [facility] C[facility];  Service: Cardiovascular;  Laterality: N/A;  Medtronic dual-chamber  . ROBOT ASSITED LAPAROSCOPIC NEPHROURETERECTOMY Left 08/22/2013   Procedure: ROBOT ASSITED LAPAROSCOPIC NEPHROURETERECTOMY;  Surgeon: [name], MD;  Location: [facility];  Service: Urology;  Laterality: Left;  . TRANSTHORACIC ECHOCARDIOGRAM  08-29-2013   mild LVH/  ef 60-65% /  grade I diastolic dysfunction/  mild MR/  mild LAE  and RAE/  moderate TR  . TRANSURETHRAL RESECTION OF BLADDER TUMOR  12-22-2010  . TRANSURETHRAL RESECTION OF BLADDER TUMOR  08/04/2011   Procedure: TRANSURETHRAL RESECTION OF BLADDER TUMOR (TURBT);  Surgeon: [name], MD;  Location: [facility];  Service: Urology;  Laterality: N/A;  . TRANSURETHRAL RESECTION OF BLADDER TUMOR N/A 01/13/2014   Procedure: TRANSURETHRAL RESECTION OF BLADDER TUMOR (TURBT);  Surgeon: [name], MD;  Location: [facility];  Service: Urology;  Laterality: N/A;    FAMILY HISTORY: Family History  Problem Relation Age of Onset  . Heart attack Mother   . Heart attack Father   . Prostate cancer Neg Hx   . Bladder Cancer Neg Hx   . Kidney cancer Neg Hx     ADVANCED DIRECTIVES (Y/N):  N  HEALTH MAINTENANCE: Social History   Tobacco Use  . Smoking status: Former Smoker    Packs/day: 1.00    Years: 50.00    Pack years: 50.00    Types: Cigarettes    Quit date: 11/02/2000    Years since quitting: 17.8  . Smokeless tobacco: Never Used  Substance Use Topics  . Alcohol use: No  . Drug use: No     Colonoscopy:  PAP:  Bone  density:  Lipid panel:  Allergies  Allergen Reactions  . Penicillins Hives    BLISTERS    Current Outpatient Medications  Medication Sig Dispense Refill  . acetaminophen (TYLENOL) 325 MG tablet Take 650 mg by mouth every 4 (four) hours as needed.    Marland Kitchen aspirin 81 MG tablet Take 81 mg by mouth daily.    . cetirizine (ZYRTEC) 10 MG tablet Take 10 mg by mouth daily.    Marland Kitchen DEXILANT 60 MG capsule Take 60 mg by mouth daily.     . furosemide (LASIX) 20 MG tablet Take 20 mg by mouth daily as needed.     . isosorbide mononitrate (IMDUR) 30 MG 24 hr tablet Take 30 mg by mouth at bedtime.     Marland Kitchen latanoprost (XALATAN) 0.005 % ophthalmic solution Place 1 drop into both eyes at bedtime.     Marland Kitchen losartan (COZAAR) 50 MG tablet Take 1 tablet by mouth every morning.     . lovastatin (MEVACOR) 20 MG tablet Take 20  mg by mouth at bedtime.     . metoprolol tartrate (LOPRESSOR) 25 MG tablet Take 12.5 mg by mouth every 12 (twelve) hours.    . Multiple Vitamins-Minerals (MULTIVITAMINS THER. W/MINERALS) TABS Take 1 tablet by mouth daily.     . Omega-3 Fatty Acids (FISH OIL) 1200 MG CPDR Take by mouth.    Marland Kitchen PROAIR HFA 108 (90 Base) MCG/ACT inhaler Inhale 1 puff into the lungs every 4 (four) hours as needed.     . senna-docusate (SENOKOT-S) 8.6-50 MG per tablet Take 1 tablet by mouth 2 (two) times daily. While taking pain meds to prevent constipation 30 tablet 0  . SYMBICORT 160-4.5 MCG/ACT inhaler INHALE 2 PUFFS INTO LUNGS TWICE DAILY. RINSE MOUTH AFTER USE. 11 g 0   Current Facility-Administered Medications  Medication Dose Route Frequency Provider Last Rate Last Dose  . gentamicin (GARAMYCIN) injection 80 mg  80 mg Intramuscular Once [name], MD      . levofloxacin [facility]) tablet 500 mg  500 mg Oral Once [name], MD        OBJECTIVE: Vitals:   09/18/18 1100  BP: (!) 153/68  Pulse: 81  Temp: 98 F (36.7 C)     Body mass index is 31.96 kg/m.    ECOG FS:0 - Asymptomatic  General:  Well-developed, well-nourished, no acute distress. Eyes: Pink conjunctiva, anicteric sclera. HEENT: Normocephalic, moist mucous membranes. Lungs: Clear to auscultation bilaterally. Heart: Regular rate and rhythm. No rubs, murmurs, or gallops. Abdomen: Soft, nontender, nondistended. No organomegaly noted, normoactive bowel sounds. Musculoskeletal: No edema, cyanosis, or clubbing. Neuro: Alert, answering all questions appropriately. Cranial nerves grossly intact. Skin: No rashes or petechiae noted. Psych: Normal affect.  LAB RESULTS:  Lab Results  Component Value Date   NA 141 03/27/2015   K 4.4 03/27/2015   CL 106 03/27/2015   CO2 31 03/27/2015   GLUCOSE 146 (H) 03/27/2015   BUN 30 (H) 03/27/2015   CREATININE 2.70 (H) 09/16/2018   CALCIUM 8.4 (L) 03/27/2015   PROT 7.6 07/07/2011   ALBUMIN 3.6 07/07/2011   AST 23 07/07/2011   ALT 23 07/07/2011   ALKPHOS 69 07/07/2011   BILITOT 0.5 07/07/2011   GFRNONAA 32 (L) 03/27/2015   GFRAA 38 (L) 03/27/2015    Lab Results  Component Value Date   WBC 7.1 03/27/2015   NEUTROABS 5.1 03/27/2015   HGB 12.0 (L) 03/27/2015   HCT 36.9 (L) 03/27/2015   MCV 93.2 03/27/2015   PLT 237 03/27/2015     STUDIES: Ct Chest Wo Contrast  Result Date: 09/16/2018 CLINICAL DATA:  Follow-up pulmonary nodule. EXAM: CT CHEST WITHOUT CONTRAST TECHNIQUE: Multidetector CT imaging of the chest was performed following the standard protocol without IV contrast. COMPARISON:  CT chest 03/12/2018. FINDINGS: Cardiovascular: The heart size appears within normal limits. Aortic atherosclerosis. Left main and 3 vessel coronary artery calcifications. Mediastinum/Nodes: Normal appearance of the thyroid gland. The trachea appears patent and is midline. Small hiatal hernia. No enlarged mediastinal or hilar lymph nodes. Lungs/Pleura: Moderate to advanced changes of emphysema. No pleural effusion. Masslike architectural distortion and volume loss within the lingula is again  noted and appears unchanged from 03/12/2018. The index nodule within the right lower lobe appears unchanged measuring 4 mm, image 123/3. Also in the left lower lobe is a stable 2 mm lung nodule, image 133/3. Triangular-shaped nodule within the right middle lobe is unchanged measuring 7 mm. Also unchanged is a small perifissural nodule in the left midlung measuring 3 mm, image  77/3. Upper Abdomen: Previous left nephrectomy identified. Abdominal aortic atherosclerosis. No acute abnormality. Musculoskeletal: Thoracic spondylosis. No acute or suspicious osseous abnormality. IMPRESSION: 1. Stable exam. No significant interval change in the appearance of radiation changes involving the lingula. 2. Small, nonspecific pulmonary nodules in both lungs are also unchanged in the interval. 3. Aortic Atherosclerosis (ICD10-I70.0) and Emphysema (ICD10-J43.9). Coronary artery calcifications. Electronically Signed   By: Kerby Moors M.D.   On: 09/16/2018 12:51    ASSESSMENT: Nodule of the left lung.  PLAN:    1. Nodule of the left lung: No biopsy was performed, but given the PET positivity suspicion was high for malignancy and patient proceeded directly to XRT.  He completed XRT in July 2019.  Patient completed XRT CT scan results from September 16, 2018 reviewed independently and reported as above with no obvious evidence of recurrent or progressive disease.  No intervention is needed at this time.  Return to clinic in 6 months with repeat imaging and further evaluation. 2.  Left upper tract urothelial carcinoma: Continue follow-up with urology and surveillance cystoscopies as scheduled. 3.  Prostate cancer: Patient noted to have increasing PSA of 10.6 and underwent prostate biopsy which revealed adenocarcinoma Gleason score 9 (4+5) he reports he is receiving Lupron from his urologist.   Patient expressed understanding and was in agreement with this plan. He also understands that He can call clinic at any time with  any questions, concerns, or complaints.   Cancer Staging No matching staging information was found for the patient.  Lloyd Huger, MD   09/19/2018 6:50 AM

## 2018-09-16 ENCOUNTER — Other Ambulatory Visit: Payer: Self-pay | Admitting: Oncology

## 2018-09-16 ENCOUNTER — Ambulatory Visit
Admission: RE | Admit: 2018-09-16 | Discharge: 2018-09-16 | Disposition: A | Discharge status: Home / Self Care | Payer: Medicare Other | Source: Ambulatory Visit | Attending: Oncology | Admitting: Oncology

## 2018-09-16 ENCOUNTER — Other Ambulatory Visit: Payer: Self-pay

## 2018-09-16 DIAGNOSIS — R911 Solitary pulmonary nodule: Secondary | ICD-10-CM

## 2018-09-16 LAB — POCT I-STAT CREATININE: Creatinine, Ser: 2.7 mg/dL — ABNORMAL HIGH (ref 0.61–1.24)

## 2018-09-18 ENCOUNTER — Inpatient Hospital Stay: Payer: Medicare Other | Attending: Oncology | Admitting: Oncology

## 2018-09-18 ENCOUNTER — Other Ambulatory Visit: Payer: Self-pay

## 2018-09-18 VITALS — BP 153/68 | HR 81 | Temp 98.0°F | Wt 198.0 lb

## 2018-09-18 DIAGNOSIS — Z87891 Personal history of nicotine dependence: Secondary | ICD-10-CM | POA: Insufficient documentation

## 2018-09-18 DIAGNOSIS — E785 Hyperlipidemia, unspecified: Secondary | ICD-10-CM | POA: Insufficient documentation

## 2018-09-18 DIAGNOSIS — Z85528 Personal history of other malignant neoplasm of kidney: Secondary | ICD-10-CM | POA: Diagnosis not present

## 2018-09-18 DIAGNOSIS — Z79899 Other long term (current) drug therapy: Secondary | ICD-10-CM | POA: Diagnosis not present

## 2018-09-18 DIAGNOSIS — R972 Elevated prostate specific antigen [PSA]: Secondary | ICD-10-CM | POA: Diagnosis not present

## 2018-09-18 DIAGNOSIS — C61 Malignant neoplasm of prostate: Secondary | ICD-10-CM | POA: Diagnosis not present

## 2018-09-18 DIAGNOSIS — Z923 Personal history of irradiation: Secondary | ICD-10-CM | POA: Diagnosis not present

## 2018-09-18 DIAGNOSIS — I10 Essential (primary) hypertension: Secondary | ICD-10-CM | POA: Insufficient documentation

## 2018-09-18 DIAGNOSIS — Z7951 Long term (current) use of inhaled steroids: Secondary | ICD-10-CM | POA: Insufficient documentation

## 2018-09-18 DIAGNOSIS — Z7982 Long term (current) use of aspirin: Secondary | ICD-10-CM | POA: Diagnosis not present

## 2018-09-18 DIAGNOSIS — I4891 Unspecified atrial fibrillation: Secondary | ICD-10-CM | POA: Insufficient documentation

## 2018-09-18 DIAGNOSIS — Z8249 Family history of ischemic heart disease and other diseases of the circulatory system: Secondary | ICD-10-CM | POA: Insufficient documentation

## 2018-09-18 DIAGNOSIS — Z8551 Personal history of malignant neoplasm of bladder: Secondary | ICD-10-CM | POA: Insufficient documentation

## 2018-09-18 DIAGNOSIS — R911 Solitary pulmonary nodule: Secondary | ICD-10-CM | POA: Diagnosis not present

## 2018-09-18 DIAGNOSIS — R918 Other nonspecific abnormal finding of lung field: Secondary | ICD-10-CM | POA: Diagnosis present

## 2018-09-18 DIAGNOSIS — K219 Gastro-esophageal reflux disease without esophagitis: Secondary | ICD-10-CM | POA: Diagnosis not present

## 2018-11-11 ENCOUNTER — Other Ambulatory Visit: Payer: Medicare Other

## 2018-11-14 ENCOUNTER — Other Ambulatory Visit: Payer: Medicare Other | Admitting: Urology

## 2018-11-18 ENCOUNTER — Ambulatory Visit (INDEPENDENT_AMBULATORY_CARE_PROVIDER_SITE_OTHER): Payer: Medicare Other | Admitting: *Deleted

## 2018-11-18 DIAGNOSIS — I441 Atrioventricular block, second degree: Secondary | ICD-10-CM

## 2018-11-18 DIAGNOSIS — I447 Left bundle-branch block, unspecified: Secondary | ICD-10-CM

## 2018-11-19 LAB — CUP PACEART REMOTE DEVICE CHECK
Battery Impedance: 496 Ohm
Battery Remaining Longevity: 80 mo
Battery Voltage: 2.78 V
Brady Statistic AP VP Percent: 66 %
Brady Statistic AP VS Percent: 0 %
Brady Statistic AS VP Percent: 33 %
Brady Statistic AS VS Percent: 1 %
Date Time Interrogation Session: 20201116134652
Implantable Lead Implant Date: 20150903
Implantable Lead Implant Date: 20150903
Implantable Lead Location: 753859
Implantable Lead Location: 753860
Implantable Lead Model: 5076
Implantable Lead Model: 5076
Implantable Pulse Generator Implant Date: 20150903
Lead Channel Impedance Value: 492 Ohm
Lead Channel Impedance Value: 548 Ohm
Lead Channel Pacing Threshold Amplitude: 0.625 V
Lead Channel Pacing Threshold Amplitude: 0.75 V
Lead Channel Pacing Threshold Pulse Width: 0.4 ms
Lead Channel Pacing Threshold Pulse Width: 0.4 ms
Lead Channel Setting Pacing Amplitude: 2 V
Lead Channel Setting Pacing Amplitude: 2.5 V
Lead Channel Setting Pacing Pulse Width: 0.4 ms
Lead Channel Setting Sensing Sensitivity: 2.8 mV

## 2018-11-20 ENCOUNTER — Encounter: Payer: Self-pay | Admitting: Urology

## 2018-11-21 ENCOUNTER — Telehealth: Payer: Self-pay | Admitting: Urology

## 2018-11-21 NOTE — Telephone Encounter (Signed)
PA FOR LUPRON/ELIGARD [id number] 07-17-2018 THRU 07-17-2019 FOR [place]

## 2018-12-13 NOTE — Progress Notes (Signed)
Remote pacemaker transmission.   

## 2019-01-20 ENCOUNTER — Ambulatory Visit: Payer: Medicare Other | Admitting: Urology

## 2019-01-22 ENCOUNTER — Other Ambulatory Visit: Payer: Self-pay

## 2019-01-22 ENCOUNTER — Ambulatory Visit (INDEPENDENT_AMBULATORY_CARE_PROVIDER_SITE_OTHER): Payer: Medicare Other | Admitting: Urology

## 2019-01-22 VITALS — BP 120/67 | HR 73 | Ht 66.0 in | Wt 193.0 lb

## 2019-01-22 DIAGNOSIS — C61 Malignant neoplasm of prostate: Secondary | ICD-10-CM

## 2019-01-22 MED ORDER — LEUPROLIDE ACETATE (6 MONTH) 45 MG ~~LOC~~ KIT
45.0000 mg | PACK | Freq: Once | SUBCUTANEOUS | Status: AC
  Administered 2019-01-22: 45 mg via SUBCUTANEOUS

## 2019-01-22 NOTE — Progress Notes (Signed)
01/22/2019 2:31 PM   Jacob [name] 05-06-1928 [id number]  Referring provider: [name], [place],  [place] [address]  Chief Complaint  Patient presents with  . Prostate Cancer    HPI: 86 y.o. male followed by Dr. 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  1 -Left Renal Pelvis Cancer - s/p left nephroureterectomy 08/2013 for large volume pTaG3 renal pelvis cancer with negative margins by Dr. [name]. He has an incisional/periumbilical hernia.  Post-op Surveillance: 01/2014 - Cysto small left wall bladder tumor (about 2.5cm) / CT No bulky disesae --> TaG3 by TURBT 01/2015 - Cysto / CT NED 02/2016 CT negative apart from 7 mm pulm nodule 05/2016 following with Dr. [name] for pulm nodule--> XRT 07/2017 09/2017  CT PET negative for mets  2 - Bladder Cancer - 2012 - TaG3 by TURBT 2013 - Induction BCG x 6 2014 - NED by surv cystos 01/2014 - TaG3 by TURBT --> Re-Induction BCG x6; 04/2014 Cysto NED; 07/2014 Cysto NED 01/2015 - Cysto / CTNED 02/2016 CT 02/2016 CT negative apart from 7 mm pulm nodule; normal cystoscopy 05/2017- NED 11/2017- NED  His last visit with Dr. [name] was June 2020 and after discussion of management options for his prostate cancer he elected ADT.  He received an Eligard injection July 2020 and was due for cystoscopy with Dr. [name] November 2020.  He was placed on my schedule today for a leuprolide injection.  He did have significant hot flashes with his initial injection.  He has not had a PSA since starting ADT.   PMH: Past Medical History:  Diagnosis Date  . Arthritis    hands  . At risk for sleep apnea    STOP-BANG=  4     SENT TO PCP 06-19-5013  . BPH (benign prostatic hypertrophy)   . Coronary artery disease cardiologist-- dr Fletcher Anon  and dr gregg taylor   a. 2006 s/p PCI/Stenting x 2 to the RCA;  b. 05/03/12 Cardiac CT: Patent RCA stent,  nonobs dzs;  c. 01/2013 Ex MV: nl EF, no ischemia.  Marland Kitchen GERD (gastroesophageal reflux disease)   . Glaucoma of both eyes   . H/O hiatal hernia   . History of atrial fibrillation    a. 2006.  Marland Kitchen History of diverticulitis   . History of kidney stones   . History of renal cell carcinoma    08-22-2013  s/p  left nephrouretectomy  . Hyperlipidemia   . Hyperlipidemia   . Hypertension   . LBBB (left bundle branch block)   . Mobitz type 2 second degree AV block   . Recurrent bladder transitional cell carcinoma ([place]) monitored by dr [name]   s/p turb's  and chemo bladder instillations  . Renal insufficiency    a. Creat rose to 2.34 post-op L nephrectomy.  . S/P placement of cardiac pacemaker MEDTRONIC last pacer check 12-01-2013   08-29-2013  for symptomatic bradycardia and mobitz 2 second degree heart block  . Symptomatic bradycardia    s/p  pacemaker placement 08-29-2013    Surgical History: Past Surgical History:  Procedure Laterality Date  . APPENDECTOMY  02/1988  . CATARACT EXTRACTION W/ INTRAOCULAR LENS  IMPLANT, BILATERAL  2011  . CORONARY ANGIOPLASTY WITH STENT PLACEMENT  2006    [facility]   X2 STENTS  TO RCA  . CYSTOSCOPY W/ RETROGRADES Right 01/13/2014   Procedure: CYSTOSCOPY WITH RETROGRADE PYELOGRAM;  Surgeon: [name], MD;  Location: [facility];  Service: Urology;  Laterality: Right;  . CYSTOSCOPY W/ URETERAL STENT PLACEMENT Left 08/22/2013   Procedure: CYSTOSCOPY WITH RETROGRADE PYELOGRAM/URETERAL STENT PLACEMENT;  Surgeon: [name], MD;  Location: [facility];  Service: Urology;  Laterality: Left;  . CYSTOSCOPY WITH BIOPSY  04/13/2011     Procedure: CYSTOSCOPY WITH BIOPSY;  Surgeon: [name], MD;  Location: [facility];  Service: Urology;  Laterality: N/A;  Cold Cup Biopsy  . CYSTOSCOPY WITH RETROGRADE PYELOGRAM, URETEROSCOPY AND STENT PLACEMENT Left 06/19/2013   Procedure: CYSTOSCOPY WITH LEFT RETROGRADE PYELOGRAM, LEFT URETEROSCOPY, ureteral  balloon dilatation, BIOPSY LEFT KIDNEY;  Surgeon: [name], MD;  Location[facility];  Service: Urology;  Laterality: Left;  . INGUINAL HERNIA REPAIR Left 08/22/2013   Procedure: LAPAROSCOPIC INGUINAL HERNIA;  Surgeon: [name], MD;  Location: [facility];  Service: Urology;  Laterality: Left;  . PERMANENT PACEMAKER INSERTION N/A 08/29/2013   Procedure: PERMANENT PACEMAKER INSERTION;  Surgeon: [name], MD;  Location: [facility];  Service: Cardiovascular;  Laterality: N/A;  Medtronic dual-chamber  . ROBOT ASSITED LAPAROSCOPIC NEPHROURETERECTOMY Left 08/22/2013   Procedure: ROBOT ASSITED LAPAROSCOPIC NEPHROURETERECTOMY;  Surgeon: [name], MD;  Location: [facility];  Service: Urology;  Laterality: Left;  . TRANSTHORACIC ECHOCARDIOGRAM  08-29-2013   mild LVH/  ef 60-65% /  grade I diastolic dysfunction/  mild MR/  mild LAE  and RAE/  moderate TR  . TRANSURETHRAL RESECTION OF BLADDER TUMOR  12-22-2010  . TRANSURETHRAL RESECTION OF BLADDER TUMOR  08/04/2011   Procedure: TRANSURETHRAL RESECTION OF BLADDER TUMOR (TURBT);  Surgeon: [name], MD;  Location: [facility];  Service: Urology;  Laterality: N/A;  . TRANSURETHRAL RESECTION OF BLADDER TUMOR N/A 01/13/2014   Procedure: TRANSURETHRAL RESECTION OF BLADDER TUMOR (TURBT);  Surgeon: [name], MD;  Location: [facility];  Service: Urology;  Laterality: N/A;    Home Medications:  Allergies as of 01/22/2019      Reactions   Penicillins Hives   BLISTERS      Medication List       Accurate as of January 22, 2019  2:31 PM. If you have any questions, ask your nurse or doctor.        acetaminophen 325 MG tablet Commonly known as: TYLENOL Take 650 mg by mouth every 4 (four) hours as needed.   aspirin 81 MG tablet Take 81 mg by mouth daily.   cetirizine 10 MG tablet Commonly known as: ZYRTEC Take 10 mg by mouth daily.   Dexilant 60 MG capsule Generic drug:  dexlansoprazole Take 60 mg by mouth daily.   Fish Oil 1200 MG Cpdr Take by mouth.   furosemide 20 MG tablet Commonly known as: LASIX Take 20 mg by mouth daily as needed.   isosorbide mononitrate 30 MG 24 hr tablet Commonly known as: IMDUR Take 30 mg by mouth at bedtime.   latanoprost 0.005 % ophthalmic solution Commonly known as: XALATAN Place 1 drop into both eyes at bedtime.   losartan 50 MG tablet Commonly known as: COZAAR Take 1 tablet by mouth every morning.   lovastatin 20 MG tablet Commonly known as: MEVACOR Take 20 mg by mouth at bedtime.   metoprolol tartrate 25 MG tablet Commonly known as: LOPRESSOR Take 12.5 mg by mouth every 12 (twelve) hours.   multivitamins ther. w/minerals Tabs tablet Take 1 tablet by mouth daily.   ProAir HFA 108 (90 Base) MCG/ACT  inhaler Generic drug: albuterol Inhale 1 puff into the lungs every 4 (four) hours as needed.   senna-docusate 8.6-50 MG tablet Commonly known as: Senokot-S Take 1 tablet by mouth 2 (two) times daily. While taking pain meds to prevent constipation   Symbicort 160-4.5 MCG/ACT inhaler Generic drug: budesonide-formoterol INHALE 2 PUFFS INTO LUNGS TWICE DAILY. RINSE MOUTH AFTER USE.       Allergies:  Allergies  Allergen Reactions  . Penicillins Hives    BLISTERS    Family History: Family History  Problem Relation Age of Onset  . Heart attack Mother   . Heart attack Father   . Prostate cancer Neg Hx   . Bladder Cancer Neg Hx   . Kidney cancer Neg Hx     Social History:  reports that he quit smoking about 18 years ago. His smoking use included cigarettes. He has a 50.00 pack-year smoking history. He has never used smokeless tobacco. He reports that he does not drink alcohol or use drugs.  ROS: UROLOGY Frequent Urination?: No Hard to postpone urination?: No Burning/pain with urination?: No Get up at night to urinate?: Yes Leakage of urine?: No Urine stream starts and stops?: No Trouble  starting stream?: No Do you have to strain to urinate?: No Blood in urine?: No Urinary tract infection?: No Sexually transmitted disease?: No Injury to kidneys or bladder?: No Painful intercourse?: No Weak stream?: No Erection problems?: No Penile pain?: No  Gastrointestinal Nausea?: No Vomiting?: No Indigestion/heartburn?: Yes Diarrhea?: No Constipation?: No  Constitutional Fever: No Night sweats?: Yes Weight loss?: No Fatigue?: No  Skin Skin rash/lesions?: No Itching?: No  Eyes Blurred vision?: No Double vision?: No  Ears/Nose/Throat Sore throat?: No Sinus problems?: Yes  Hematologic/Lymphatic Swollen glands?: No Easy bruising?: No  Cardiovascular Leg swelling?: No Chest pain?: No  Respiratory Cough?: No Shortness of breath?: Yes  Endocrine Excessive thirst?: No  Musculoskeletal Back pain?: No Joint pain?: No  Neurological Headaches?: No Dizziness?: No  Psychologic Depression?: No Anxiety?: No  Physical Exam: BP 120/67   Pulse 73   Ht 5\' 6"  (1.676 m)   Wt 193 lb (87.5 kg)   BMI 31.15 kg/m   Constitutional:  Alert and oriented, No acute distress. HEENT: [place] AT, moist mucus membranes.  Trachea midline, no masses. Cardiovascular: No clubbing, cyanosis, or edema. Respiratory: Normal respiratory effort, no increased work of breathing.   Assessment & Plan:    - Prostate cancer [facility]) Mr. Hetz received a leuprolide injection today.  A PSA was drawn.  - Urothelial carcinoma Schedule cystoscopy with Dr. Nicholaus Bloom, MD  [place] [address], [place], [place] [address] [phone]

## 2019-01-24 ENCOUNTER — Encounter: Payer: Self-pay | Admitting: Urology

## 2019-01-30 ENCOUNTER — Telehealth: Payer: Self-pay | Admitting: Urology

## 2019-01-30 NOTE — Telephone Encounter (Signed)
Patient called and left a voicemail today that he wanted to cx his app on 02-04-19 for his cysto  Did not say why or that if wanted to reschd. Looks like he has cx in the past. Before I cx it I didn't know if you wanted to call and see why and to talk to him to make sure.     Sharyn Lull

## 2019-01-30 NOTE — Telephone Encounter (Addendum)
Called patient to discuss cancelling cysto appointment, stated he didn't want to pursue at the time. Will call back to reschedule.

## 2019-02-04 ENCOUNTER — Other Ambulatory Visit: Payer: Self-pay | Admitting: Urology

## 2019-02-17 ENCOUNTER — Ambulatory Visit (INDEPENDENT_AMBULATORY_CARE_PROVIDER_SITE_OTHER): Payer: Medicare Other | Admitting: *Deleted

## 2019-02-17 DIAGNOSIS — I441 Atrioventricular block, second degree: Secondary | ICD-10-CM

## 2019-02-17 LAB — CUP PACEART REMOTE DEVICE CHECK
Battery Impedance: 546 Ohm
Battery Remaining Longevity: 76 mo
Battery Voltage: 2.78 V
Brady Statistic AP VP Percent: 63 %
Brady Statistic AP VS Percent: 0 %
Brady Statistic AS VP Percent: 36 %
Brady Statistic AS VS Percent: 1 %
Date Time Interrogation Session: 20210215105551
Implantable Lead Implant Date: 20150903
Implantable Lead Implant Date: 20150903
Implantable Lead Location: 753859
Implantable Lead Location: 753860
Implantable Lead Model: 5076
Implantable Lead Model: 5076
Implantable Pulse Generator Implant Date: 20150903
Lead Channel Impedance Value: 464 Ohm
Lead Channel Impedance Value: 533 Ohm
Lead Channel Pacing Threshold Amplitude: 0.75 V
Lead Channel Pacing Threshold Amplitude: 0.75 V
Lead Channel Pacing Threshold Pulse Width: 0.4 ms
Lead Channel Pacing Threshold Pulse Width: 0.4 ms
Lead Channel Setting Pacing Amplitude: 2 V
Lead Channel Setting Pacing Amplitude: 2.5 V
Lead Channel Setting Pacing Pulse Width: 0.4 ms
Lead Channel Setting Sensing Sensitivity: 2.8 mV

## 2019-02-18 NOTE — Progress Notes (Signed)
PPM Remote  

## 2019-03-13 NOTE — Progress Notes (Signed)
[facility]  [phone] [phone]  ID: Jacob [name] OB: February 18, 1928  MR#: [id number]  JAS#:[id number]  Patient Care Team: [name], MD as PCP - General (Internal Medicine)   CHIEF COMPLAINT: Nodule of the left lung, prostate cancer.  INTERVAL HISTORY: Patient returns to clinic today for routine 41-month evaluation and discussion of his imaging results.  He currently feels well and is asymptomatic.  He continues to follow-up closely with urology for his prostate cancer and history of urothelial carcinoma.  He has no neurologic complaints. He denies any recent fevers or illnesses. He has a good appetite and denies weight loss.  He denies any chest pain, shortness of breath, cough, or hemoptysis.  He has no nausea, vomiting, constipation, or diarrhea. He has no new urinary complaints.  Patient offers no specific complaints today.  REVIEW OF SYSTEMS:   Review of Systems  Constitutional: Negative.  Negative for fever, malaise/fatigue and weight loss.  Respiratory: Negative.  Negative for cough, hemoptysis and shortness of breath.   Cardiovascular: Negative.  Negative for chest pain and leg swelling.  Gastrointestinal: Negative.  Negative for abdominal pain.  Genitourinary: Negative.  Negative for dysuria.  Musculoskeletal: Negative.  Negative for back pain.  Skin: Negative.  Negative for rash.  Neurological: Negative.  Negative for dizziness, sensory change, focal weakness, weakness and headaches.  Psychiatric/Behavioral: Negative.  The patient is not nervous/anxious.     As per HPI. Otherwise, a complete review of systems is negative.  PAST MEDICAL HISTORY: Past Medical History:  Diagnosis Date  . Arthritis    hands  . At risk for sleep apnea    STOP-BANG=  4     SENT TO PCP 06-19-5013  . BPH (benign prostatic hypertrophy)   . Coronary artery disease cardiologist-- dr Fletcher Anon  and dr gregg taylor   a. 2006 s/p PCI/Stenting x 2 to the  RCA;  b. 05/03/12 Cardiac CT: Patent RCA stent, nonobs dzs;  c. 01/2013 Ex MV: nl EF, no ischemia.  Marland Kitchen GERD (gastroesophageal reflux disease)   . Glaucoma of both eyes   . H/O hiatal hernia   . History of atrial fibrillation    a. 2006.  Marland Kitchen History of diverticulitis   . History of kidney stones   . History of renal cell carcinoma    08-22-2013  s/p  left nephrouretectomy  . Hyperlipidemia   . Hyperlipidemia   . Hypertension   . LBBB (left bundle branch block)   . Mobitz type 2 second degree AV block   . Recurrent bladder transitional cell carcinoma ([place]) monitored by dr [name]   s/p turb's  and chemo bladder instillations  . Renal insufficiency    a. Creat rose to 2.34 post-op L nephrectomy.  . S/P placement of cardiac pacemaker MEDTRONIC last pacer check 12-01-2013   08-29-2013  for symptomatic bradycardia and mobitz 2 second degree heart block  . Symptomatic bradycardia    s/p  pacemaker placement 08-29-2013    PAST SURGICAL HISTORY: Past Surgical History:  Procedure Laterality Date  . APPENDECTOMY  02/1988  . CATARACT EXTRACTION W/ INTRAOCULAR LENS  IMPLANT, BILATERAL  2011  . CORONARY ANGIOPLASTY WITH STENT PLACEMENT  2006    [facility]   X2 STENTS  TO RCA  . CYSTOSCOPY W/ RETROGRADES Right 01/13/2014   Procedure: CYSTOSCOPY WITH RETROGRADE PYELOGRAM;  Surgeon: [name], MD;  Location[facility];  Service: Urology;  Laterality: Right;  . CYSTOSCOPY W/ URETERAL STENT PLACEMENT Left 08/22/2013  Procedure: CYSTOSCOPY WITH RETROGRADE PYELOGRAM/URETERAL STENT PLACEMENT;  Surgeon: [name], MD;  [facility];  Service: Urology;  Laterality: Left;  . CYSTOSCOPY WITH BIOPSY  04/13/2011     Procedure: CYSTOSCOPY WITH BIOPSY;  Surgeon: [name], MD;  Location: [facility];  Service: Urology;  Laterality: N/A;  Cold Cup Biopsy  . CYSTOSCOPY WITH RETROGRADE PYELOGRAM, URETEROSCOPY AND STENT PLACEMENT Left 06/19/2013   Procedure: CYSTOSCOPY WITH LEFT  RETROGRADE PYELOGRAM, LEFT URETEROSCOPY, ureteral balloon dilatation, BIOPSY LEFT KIDNEY;  Surgeon: [name], MD;  Location: [facility];  Service: Urology;  Laterality: Left;  . INGUINAL HERNIA REPAIR Left 08/22/2013   Procedure: LAPAROSCOPIC INGUINAL HERNIA;  Surgeon: [name], MD;  Location[facility];  Service: Urology;  Laterality: Left;  . PERMANENT PACEMAKER INSERTION N/A 08/29/2013   Procedure: PERMANENT PACEMAKER INSERTION;  Surgeon: [name], MD;  Location: [facility] LAB;  Service: Cardiovascular;  Laterality: N/A;  Medtronic dual-chamber  . ROBOT ASSITED LAPAROSCOPIC NEPHROURETERECTOMY Left 08/22/2013   Procedure: ROBOT ASSITED LAPAROSCOPIC NEPHROURETERECTOMY;  Surgeon: [name], MD;  Location[facility];  Service: Urology;  Laterality: Left;  . TRANSTHORACIC ECHOCARDIOGRAM  08-29-2013   mild LVH/  ef 60-65% /  grade I diastolic dysfunction/  mild MR/  mild LAE  and RAE/  moderate TR  . TRANSURETHRAL RESECTION OF BLADDER TUMOR  12-22-2010  . TRANSURETHRAL RESECTION OF BLADDER TUMOR  08/04/2011   Procedure: TRANSURETHRAL RESECTION OF BLADDER TUMOR (TURBT);  Surgeon: [name], MD;  Location[facility];  Service: Urology;  Laterality: N/A;  . TRANSURETHRAL RESECTION OF BLADDER TUMOR N/A 01/13/2014   Procedure: TRANSURETHRAL RESECTION OF BLADDER TUMOR (TURBT);  Surgeon: [name], MD;  Location: [facility];  Service: Urology;  Laterality: N/A;    FAMILY HISTORY: Family History  Problem Relation Age of Onset  . Heart attack Mother   . Heart attack Father   . Prostate cancer Neg Hx   . Bladder Cancer Neg Hx   . Kidney cancer Neg Hx     ADVANCED DIRECTIVES (Y/N):  N  HEALTH MAINTENANCE: Social History   Tobacco Use  . Smoking status: Former Smoker    Packs/day: 1.00    Years: 50.00    Pack years: 50.00    Types: Cigarettes    Quit date: 11/02/2000    Years since quitting: 18.3  . Smokeless  tobacco: Never Used  Substance Use Topics  . Alcohol use: No  . Drug use: No     Colonoscopy:  PAP:  Bone density:  Lipid panel:  Allergies  Allergen Reactions  . Penicillins Hives    BLISTERS    Current Outpatient Medications  Medication Sig Dispense Refill  . acetaminophen (TYLENOL) 325 MG tablet Take 650 mg by mouth every 4 (four) hours as needed.    Marland Kitchen aspirin 81 MG tablet Take 81 mg by mouth daily.    . cetirizine (ZYRTEC) 10 MG tablet Take 10 mg by mouth daily.    Marland Kitchen DEXILANT 60 MG capsule Take 60 mg by mouth daily.     . isosorbide mononitrate (IMDUR) 30 MG 24 hr tablet Take 30 mg by mouth at bedtime.     Marland Kitchen latanoprost (XALATAN) 0.005 % ophthalmic solution Place 1 drop into both eyes at bedtime.     Marland Kitchen losartan (COZAAR) 50 MG tablet Take 1 tablet by mouth every morning.     . lovastatin (MEVACOR) 20 MG tablet Take 20 mg by mouth at  bedtime.     . metoprolol tartrate (LOPRESSOR) 25 MG tablet Take 12.5 mg by mouth every 12 (twelve) hours.    . Multiple Vitamins-Minerals (MULTIVITAMINS THER. W/MINERALS) TABS Take 1 tablet by mouth daily.     . Omega-3 Fatty Acids (FISH OIL) 1200 MG CPDR Take by mouth.    Marland Kitchen PROAIR HFA 108 (90 Base) MCG/ACT inhaler Inhale 1 puff into the lungs every 4 (four) hours as needed.     . senna-docusate (SENOKOT-S) 8.6-50 MG per tablet Take 1 tablet by mouth 2 (two) times daily. While taking pain meds to prevent constipation 30 tablet 0  . SYMBICORT 160-4.5 MCG/ACT inhaler INHALE 2 PUFFS INTO LUNGS TWICE DAILY. RINSE MOUTH AFTER USE. 11 g 0  . furosemide (LASIX) 20 MG tablet Take 20 mg by mouth daily as needed.      Current Facility-Administered Medications  Medication Dose Route Frequency Provider Last Rate Last Admin  . gentamicin (GARAMYCIN) injection 80 mg  80 mg Intramuscular Once [name], MD      . levofloxacin [facility]) tablet 500 mg  500 mg Oral Once [name], MD        OBJECTIVE: Vitals:   03/20/19 1044  BP: (!) 137/59    Pulse: 63  Temp: (!) 97 F (36.1 C)     Body mass index is 30.38 kg/m.    ECOG FS:0 - Asymptomatic  General: Well-developed, well-nourished, no acute distress. Eyes: Pink conjunctiva, anicteric sclera. HEENT: Normocephalic, moist mucous membranes. Lungs: No audible wheezing or coughing. Heart: Regular rate and rhythm. Abdomen: Soft, nontender, no obvious distention. Musculoskeletal: No edema, cyanosis, or clubbing. Neuro: Alert, answering all questions appropriately. Cranial nerves grossly intact. Skin: No rashes or petechiae noted. Psych: Normal affect.  LAB RESULTS:  Lab Results  Component Value Date   NA 141 03/27/2015   K 4.4 03/27/2015   CL 106 03/27/2015   CO2 31 03/27/2015   GLUCOSE 146 (H) 03/27/2015   BUN 30 (H) 03/27/2015   CREATININE 2.70 (H) 09/16/2018   CALCIUM 8.4 (L) 03/27/2015   PROT 7.6 07/07/2011   ALBUMIN 3.6 07/07/2011   AST 23 07/07/2011   ALT 23 07/07/2011   ALKPHOS 69 07/07/2011   BILITOT 0.5 07/07/2011   GFRNONAA 32 (L) 03/27/2015   GFRAA 38 (L) 03/27/2015    Lab Results  Component Value Date   WBC 7.1 03/27/2015   NEUTROABS 5.1 03/27/2015   HGB 12.0 (L) 03/27/2015   HCT 36.9 (L) 03/27/2015   MCV 93.2 03/27/2015   PLT 237 03/27/2015     STUDIES: CT Chest Wo Contrast  Result Date: 03/18/2019 CLINICAL DATA:  Follow-up pulmonary nodules.  Smoker. EXAM: CT CHEST WITHOUT CONTRAST TECHNIQUE: Multidetector CT imaging of the chest was performed following the standard protocol without IV contrast. COMPARISON:  09/16/2018 and 03/12/2018 FINDINGS: Cardiovascular: No acute findings. Aortic and coronary artery atherosclerosis incidentally noted. Pacemaker leads again seen in the right heart. Mediastinum/Nodes: No masses or pathologically enlarged lymph nodes identified on this unenhanced exam. Lungs/Pleura: Moderate centrilobular emphysema again noted. Stable pleural-parenchymal scarring in lingula. Stable 3 mm subpleural pulmonary nodules noted in  the lateral right lower lobe on image 121/3 and posterior right lower lobe on image 132/3. No new or enlarging pulmonary nodules or masses identified. No evidence of pulmonary infiltrate or pleural effusion. Upper Abdomen:  Unremarkable. Musculoskeletal:  No suspicious bone lesions. IMPRESSION: Stable sub-cm right lower lobe pulmonary nodules, consistent with benign etiology. No evidence of malignancy or metastatic disease within the thorax.  Aortic Atherosclerosis (ICD10-I70.0) and Emphysema (ICD10-J43.9). Electronically Signed   By: Marlaine Hind M.D.   On: 03/18/2019 15:38    ASSESSMENT: Nodule of the left lung.  PLAN:    1. Nodule of the left lung: No biopsy was performed, but given the PET positivity suspicion was high for malignancy and patient proceeded directly to XRT.  He completed XRT in July 2019.  CT scan results from March 18, 2019 reviewed independently and reported as above with no obvious evidence of malignancy or metastatic disease.  No intervention is needed at this time.  After lengthy discussion with the patient, it was agreed upon that no further follow-up is necessary.  Any additional imaging can be completed by urology whom he sees regularly for his prostate cancer.  Please refer patient back if there are any questions or concerns.   2.  Left upper tract urothelial carcinoma: Continue follow-up with urology and surveillance cystoscopies as scheduled. 3.  Prostate cancer: Prostate biopsy revealed adenocarcinoma Gleason score 9 (4+5).  Continue follow-up for urology for Eligard.  Imaging will also be completed by urology.  His most recent injection occurred in January 2021.  No further follow-up as above.  I spent a total of 30 minutes reviewing chart data, face-to-face evaluation with the patient, counseling and coordination of care as detailed above.   Patient expressed understanding and was in agreement with this plan. He also understands that He can call clinic at any time with  any questions, concerns, or complaints.     Lloyd Huger, MD   03/20/2019 1:31 PM

## 2019-03-18 ENCOUNTER — Other Ambulatory Visit: Payer: Self-pay

## 2019-03-18 ENCOUNTER — Ambulatory Visit
Admission: RE | Admit: 2019-03-18 | Discharge: 2019-03-18 | Disposition: A | Discharge status: Home / Self Care | Payer: Medicare Other | Source: Ambulatory Visit | Attending: Oncology | Admitting: Oncology

## 2019-03-18 DIAGNOSIS — R911 Solitary pulmonary nodule: Secondary | ICD-10-CM | POA: Insufficient documentation

## 2019-03-20 ENCOUNTER — Encounter: Payer: Self-pay | Admitting: Oncology

## 2019-03-20 ENCOUNTER — Inpatient Hospital Stay: Payer: Medicare Other | Attending: Oncology | Admitting: Oncology

## 2019-03-20 VITALS — BP 137/59 | HR 63 | Temp 97.0°F | Wt 188.2 lb

## 2019-03-20 DIAGNOSIS — J439 Emphysema, unspecified: Secondary | ICD-10-CM | POA: Insufficient documentation

## 2019-03-20 DIAGNOSIS — Z923 Personal history of irradiation: Secondary | ICD-10-CM | POA: Insufficient documentation

## 2019-03-20 DIAGNOSIS — Z87891 Personal history of nicotine dependence: Secondary | ICD-10-CM | POA: Insufficient documentation

## 2019-03-20 DIAGNOSIS — C61 Malignant neoplasm of prostate: Secondary | ICD-10-CM | POA: Diagnosis not present

## 2019-03-20 DIAGNOSIS — I2510 Atherosclerotic heart disease of native coronary artery without angina pectoris: Secondary | ICD-10-CM | POA: Diagnosis not present

## 2019-03-20 DIAGNOSIS — R911 Solitary pulmonary nodule: Secondary | ICD-10-CM | POA: Diagnosis not present

## 2019-03-20 NOTE — Progress Notes (Signed)
Patient has no concerns to report.

## 2019-03-24 ENCOUNTER — Ambulatory Visit: Payer: Medicare Other | Admitting: Gastroenterology

## 2019-05-19 ENCOUNTER — Ambulatory Visit (INDEPENDENT_AMBULATORY_CARE_PROVIDER_SITE_OTHER): Payer: Medicare Other | Admitting: *Deleted

## 2019-05-19 DIAGNOSIS — I441 Atrioventricular block, second degree: Secondary | ICD-10-CM | POA: Diagnosis not present

## 2019-05-19 DIAGNOSIS — R001 Bradycardia, unspecified: Secondary | ICD-10-CM

## 2019-05-20 LAB — CUP PACEART REMOTE DEVICE CHECK
Battery Impedance: 620 Ohm
Battery Remaining Longevity: 71 mo
Battery Voltage: 2.78 V
Brady Statistic AP VP Percent: 63 %
Brady Statistic AP VS Percent: 1 %
Brady Statistic AS VP Percent: 36 %
Brady Statistic AS VS Percent: 1 %
Date Time Interrogation Session: 20210518083733
Implantable Lead Implant Date: 20150903
Implantable Lead Implant Date: 20150903
Implantable Lead Location: 753859
Implantable Lead Location: 753860
Implantable Lead Model: 5076
Implantable Lead Model: 5076
Implantable Pulse Generator Implant Date: 20150903
Lead Channel Impedance Value: 478 Ohm
Lead Channel Impedance Value: 505 Ohm
Lead Channel Pacing Threshold Amplitude: 0.75 V
Lead Channel Pacing Threshold Amplitude: 0.75 V
Lead Channel Pacing Threshold Pulse Width: 0.4 ms
Lead Channel Pacing Threshold Pulse Width: 0.4 ms
Lead Channel Setting Pacing Amplitude: 2 V
Lead Channel Setting Pacing Amplitude: 2.5 V
Lead Channel Setting Pacing Pulse Width: 0.4 ms
Lead Channel Setting Sensing Sensitivity: 2.8 mV

## 2019-05-21 NOTE — Progress Notes (Signed)
Remote pacemaker transmission.   

## 2019-07-23 ENCOUNTER — Other Ambulatory Visit: Payer: Self-pay

## 2019-07-23 ENCOUNTER — Ambulatory Visit: Payer: Medicare Other

## 2019-08-01 ENCOUNTER — Telehealth: Payer: Self-pay

## 2019-08-01 NOTE — Telephone Encounter (Signed)
-----   Message from Shanon Ace, [place] sent at 07/23/2019  9:09 AM EDT ----- Regarding: Lupron Hey girl,   This patient was on the nurse schedule for Lupron today. Unfortunately, we didn't get another PA on him when it was scheduled in Jan. Could you do a PA for him? His insurance has not changed.   I told him once we get it, we will call him to schedule.   Thanks!Adria Devon

## 2019-08-01 NOTE — Telephone Encounter (Signed)
Coverage determination form faxed for Eligard/Lupron.

## 2019-08-12 ENCOUNTER — Telehealth: Payer: Self-pay | Admitting: Urology

## 2019-08-12 NOTE — Telephone Encounter (Signed)
Called pt, no answer. LM for pt to call back and to schedule Eligard injection.

## 2019-08-12 NOTE — Telephone Encounter (Signed)
Pt. Left message asking for a return call to schedule Eligard injection

## 2019-08-12 NOTE — Telephone Encounter (Signed)
Pt scheduled  

## 2019-08-12 NOTE — Telephone Encounter (Signed)
PA for Eligard APPROVED [phone] Dates 08/12/19 - 08/11/20

## 2019-08-13 IMAGING — CT CT CHEST W/O CM
2 of 4 series · 14 of 36 positions shown, 17 images · non-contrast
Comparison: 11/13/2016 chest CT.

CLINICAL DATA: Follow-up pulmonary nodules. History of left
nephroureterectomy 08/22/2013 for urothelial carcinoma of the left
renal pelvis. History of TURBT for bladder carcinoma 01/13/2014.
former smoker.

EXAM:
CT CHEST WITHOUT CONTRAST
TECHNIQUE: Multidetector CT imaging of the chest was performed following the
standard protocol without IV contrast.

[Series 2: chest (person_name) · axial · 0.73mm/px · z∈[-1101,-797]mm · 11 of 180 slices shown, 14 images (1 of 2)]
[im 14/180  mediastinal]
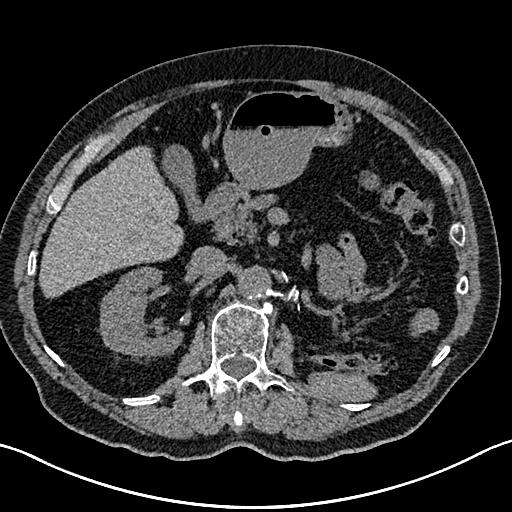
[im 14/180  lung]
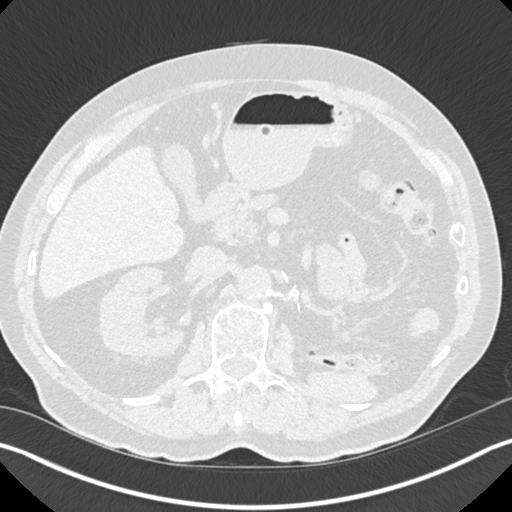
[im 28/180  lung]
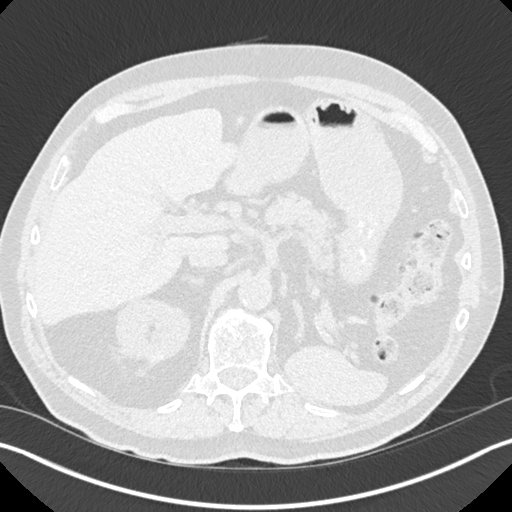
[im 42/180  lung]
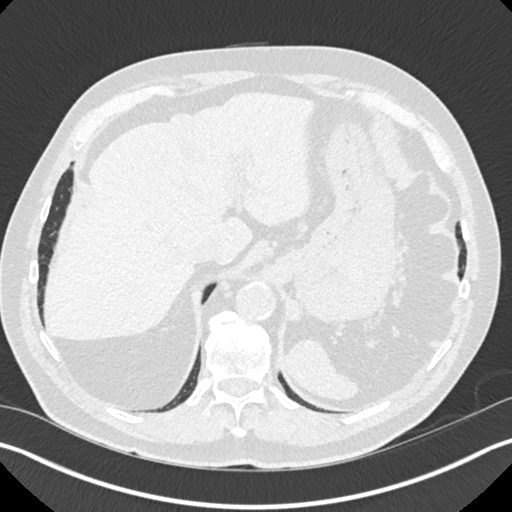
[im 56/180  lung]
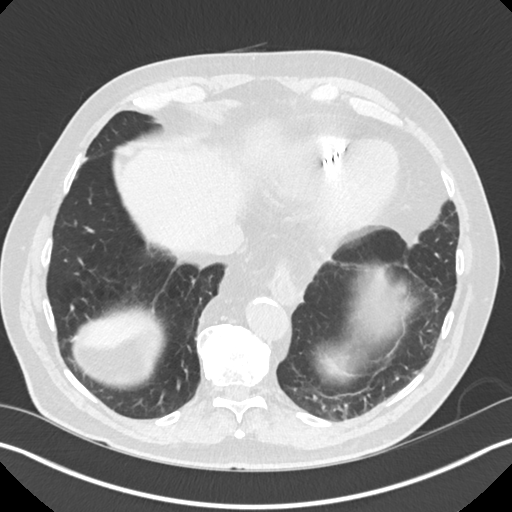
[im 69/180  mediastinal]
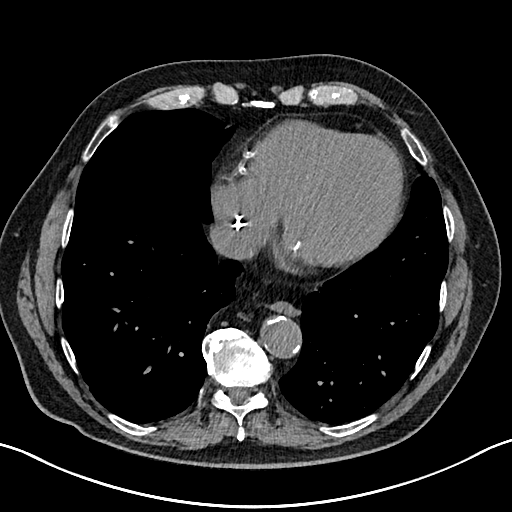
[im 69/180  lung]
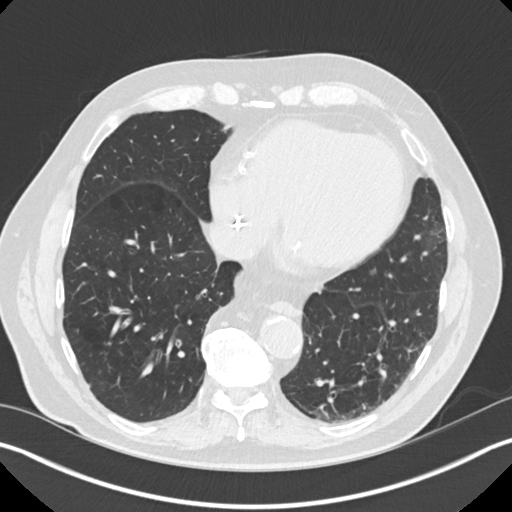
[im 97/180  lung]
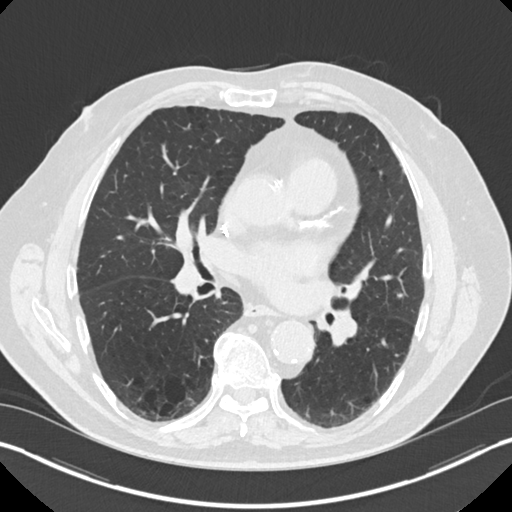
[im 111/180  lung]
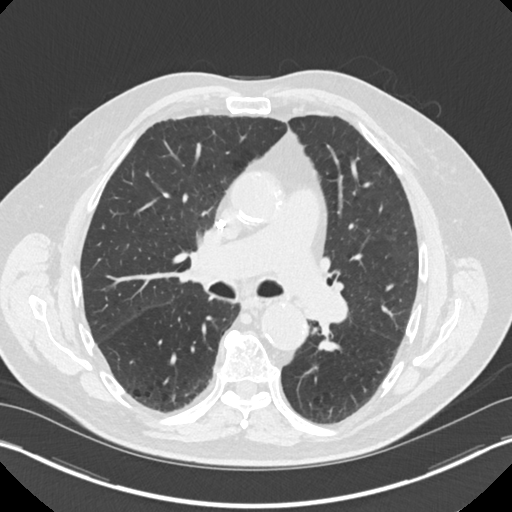
[im 124/180  lung]
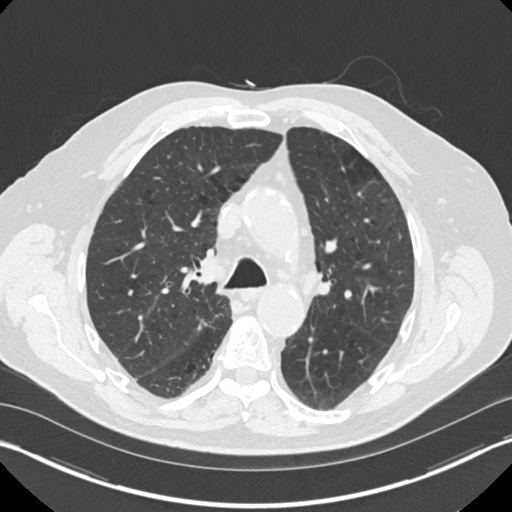
[im 138/180  mediastinal]
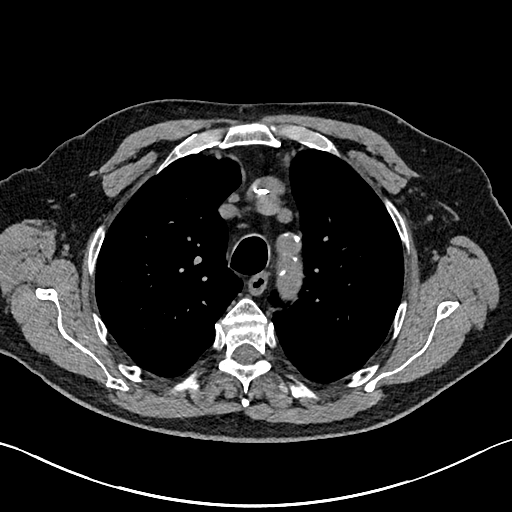
[im 138/180  lung]
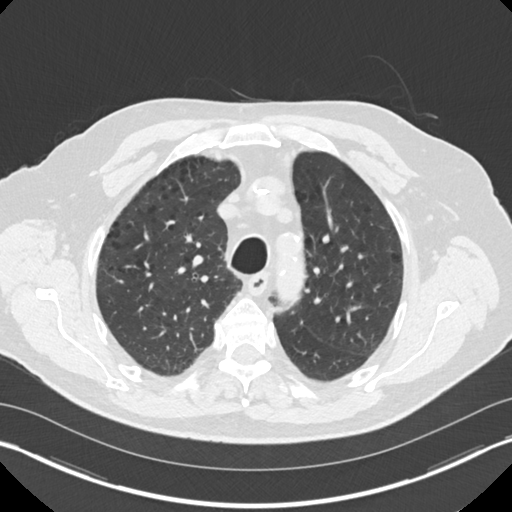
[im 152/180  lung]
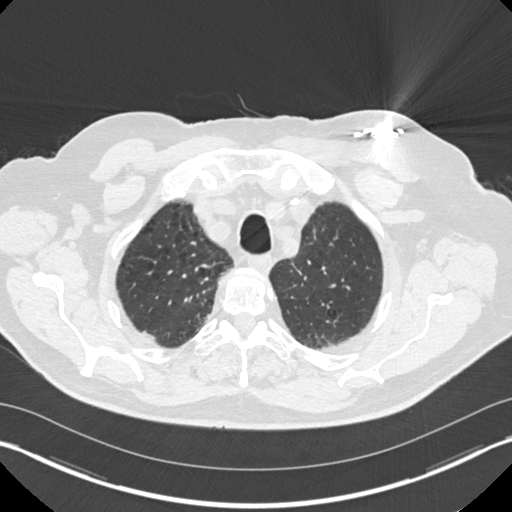
[im 166/180  lung]
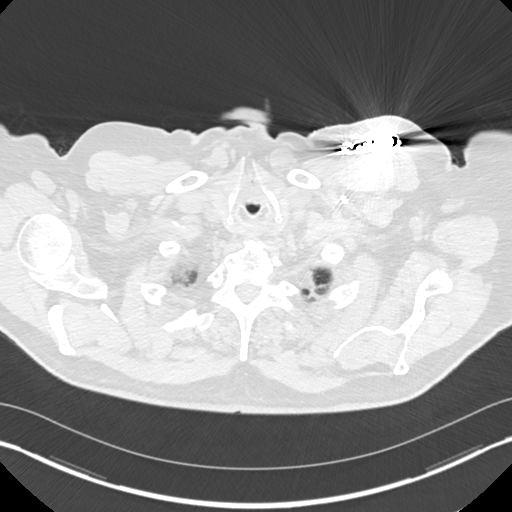

[Series 5: chest (person_name) · coronal · 0.70mm/px · 3 of 161 slices shown (2 of 2)]
[im 33/161  lung]
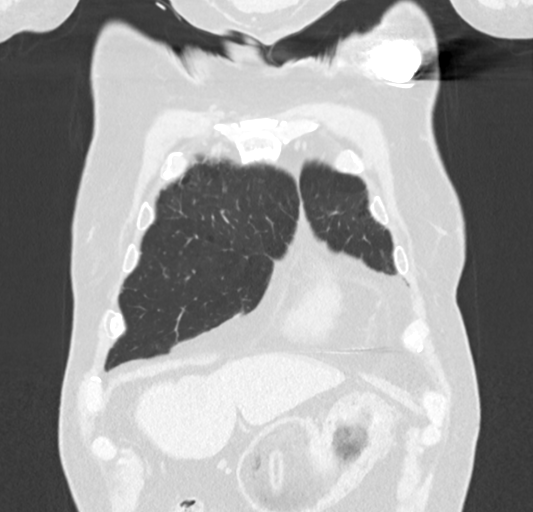
[im 65/161  lung]
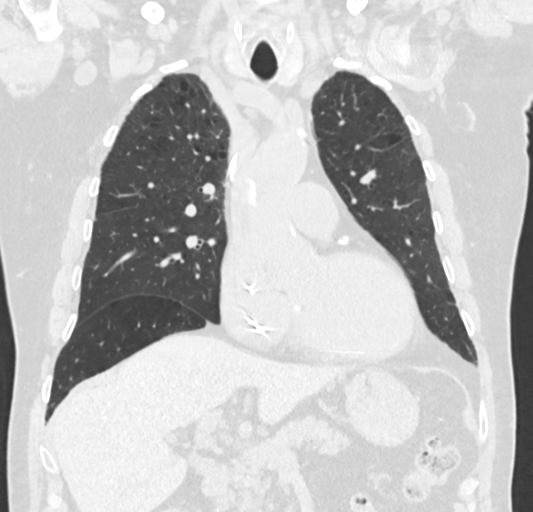
[im 97/161  lung]
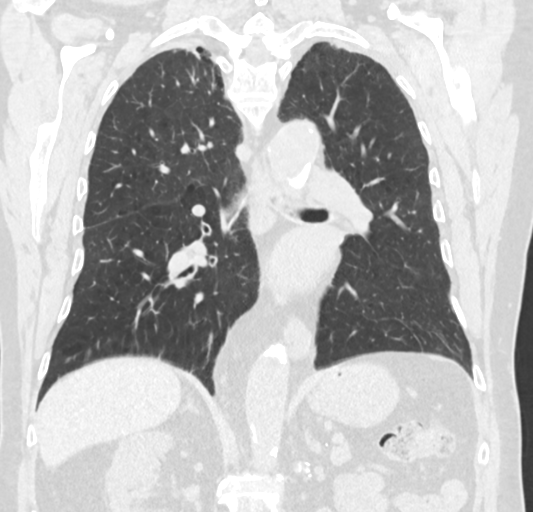

[14 of 36 positions shown; findings below may reference images not displayed]

FINDINGS: Cardiovascular: Normal heart size. No significant pericardial
effusion/thickening. Left anterior descending, left circumflex and
right coronary atherosclerosis. Stable configuration of 2 lead left
subclavian pacemaker with lead tips in the right atrium and right
ventricular apex. Atherosclerotic nonaneurysmal thoracic aorta.
Stable top-normal caliber main pulmonary artery (3.2 cm diameter).

Mediastinum/Nodes: No discrete thyroid nodules. Unremarkable
esophagus. No axillary adenopathy. Mildly enlarged 1.0 cm subcarinal
node (series 2/image 76), stable using similar measurement
technique. No new pathologically enlarged mediastinal nodes. No
discrete hilar adenopathy on these noncontrast images.

Lungs/Pleura: No pneumothorax. No pleural effusion. Moderate
centrilobular emphysema with mild diffuse bronchial wall thickening.
No acute consolidative airspace disease or lung masses. Dominant
irregular solid 1.8 x 1.4 cm lingular pulmonary nodule abutting the
peripheral pleural surface (series 3/image 106), significantly
increased from 0.9 x 0.8 cm. A few additional scattered small solid
pulmonary nodules in both lungs measuring up to 6 mm in the right
middle lobe (series 3/image 105) are unchanged. No new significant
pulmonary nodules.

Upper abdomen: There is a 3.2 x 2.1 cm segment 4A left liver lobe
mass anteriorly (series 2/image 138) with near isodensity to the
liver parenchyma, stable in size since 01/06/2014 CT abdomen study,
where the density of this lesion was simple fluid. Left nephrectomy.
Colonic diverticulosis.

Musculoskeletal: No aggressive appearing focal osseous lesions.
Marked thoracic spondylosis.
IMPRESSION: 1. Continued growth of irregular solid 1.8 cm peripheral lingular
pulmonary nodule, most compatible with a malignant nodule, either
primary bronchogenic carcinoma or metastasis. Consider PET-CT and/or
tissue sample for further characterization.
2. Additional scattered subcentimeter pulmonary nodules in both
lungs are all stable. No new significant pulmonary nodules.
3. Mild subcarinal lymphadenopathy, nonspecific, stable, more likely
reactive. No new or progressive thoracic adenopathy.
4. Moderate centrilobular emphysema with mild diffuse bronchial wall
thickening, suggesting COPD.
5. Segment 4A left liver lobe mass, isodense to the liver, stable in
size since 6452 CT abdomen study, where it was seen to represent a
simple liver cyst. Findings are suggestive of a
hemorrhagic/proteinaceous liver cyst.

Aortic Atherosclerosis (03DYF-2AM.M) and Emphysema (03DYF-G3F.7).

## 2019-08-18 ENCOUNTER — Other Ambulatory Visit: Payer: Self-pay

## 2019-08-18 ENCOUNTER — Telehealth: Payer: Self-pay

## 2019-08-18 ENCOUNTER — Ambulatory Visit (INDEPENDENT_AMBULATORY_CARE_PROVIDER_SITE_OTHER): Payer: Medicare Other

## 2019-08-18 DIAGNOSIS — C61 Malignant neoplasm of prostate: Secondary | ICD-10-CM | POA: Diagnosis not present

## 2019-08-18 MED ORDER — LEUPROLIDE ACETATE (6 MONTH) 45 MG ~~LOC~~ KIT
45.0000 mg | PACK | Freq: Once | SUBCUTANEOUS | Status: AC
  Administered 2019-08-18: 45 mg via SUBCUTANEOUS

## 2019-08-18 NOTE — Telephone Encounter (Signed)
He is overdue to be seen regardless.  Please schedule him a follow-up with me and we can discuss this further.  [name], MD

## 2019-08-18 NOTE — Progress Notes (Signed)
Eligard SubQ Injection   Due to Prostate Cancer patient is present today for a Eligard Injection.  Medication: Eligard 6 month Dose:45 mg  Location: left  Lot: [id number]  Exp: 01/31/2021  Patient tolerated well, no complications were noted  Performed by: Kerman Passey, RMA  Per Dr. [name] patient is to continue therapy for 6 months . Patient's next follow up was scheduled for 02/22. This appointment was scheduled using wheel and given to patient today along with reminder continue on Vitamin D 800-1000iu and Calium 1000-1200mg  daily while on Androgen Deprivation Therapy.  PA approval dates: 08/2020

## 2019-08-18 NOTE — Telephone Encounter (Signed)
Saw your previous note regarding pt canceling cysto appointment. I spoke with patient today asking if he would like to reschedule for cysto. Patient stated " I do not need that done" and declined appointment for Cysto. I did schedule him for a 6 month follow up .

## 2019-08-18 NOTE — Telephone Encounter (Signed)
The pt had issues sending a transmission. He got the error code [id number]. I told him we was going to let the handheld charge for an hour then try again. If it do not work I will have him call [employer] tech support.

## 2019-08-18 NOTE — Telephone Encounter (Signed)
Pt could not get it to the monitor to work. I gave him the number to tech support to call at his convenience. He states he may call after his doctors appointment again.

## 2019-08-19 DIAGNOSIS — D631 Anemia in chronic kidney disease: Secondary | ICD-10-CM | POA: Insufficient documentation

## 2019-08-20 NOTE — Telephone Encounter (Signed)
Patient advised, very adamant about not getting any procedure agreed to schedule office appointment to discuss further. Voiced understanding.

## 2019-08-22 ENCOUNTER — Ambulatory Visit (INDEPENDENT_AMBULATORY_CARE_PROVIDER_SITE_OTHER): Payer: Medicare Other | Admitting: *Deleted

## 2019-08-22 DIAGNOSIS — I441 Atrioventricular block, second degree: Secondary | ICD-10-CM

## 2019-08-22 NOTE — Telephone Encounter (Signed)
Pt received his new handheld and sent a manual transmission. Transmission received.

## 2019-08-24 LAB — CUP PACEART REMOTE DEVICE CHECK
Battery Impedance: 645 Ohm
Battery Remaining Longevity: 69 mo
Battery Voltage: 2.79 V
Brady Statistic AP VP Percent: 62 %
Brady Statistic AP VS Percent: 1 %
Brady Statistic AS VP Percent: 36 %
Brady Statistic AS VS Percent: 1 %
Date Time Interrogation Session: 20210820160330
Implantable Lead Implant Date: 20150903
Implantable Lead Implant Date: 20150903
Implantable Lead Location: 753859
Implantable Lead Location: 753860
Implantable Lead Model: 5076
Implantable Lead Model: 5076
Implantable Pulse Generator Implant Date: 20150903
Lead Channel Impedance Value: 433 Ohm
Lead Channel Impedance Value: 481 Ohm
Lead Channel Pacing Threshold Amplitude: 0.75 V
Lead Channel Pacing Threshold Amplitude: 0.75 V
Lead Channel Pacing Threshold Pulse Width: 0.4 ms
Lead Channel Pacing Threshold Pulse Width: 0.4 ms
Lead Channel Setting Pacing Amplitude: 2 V
Lead Channel Setting Pacing Amplitude: 2.5 V
Lead Channel Setting Pacing Pulse Width: 0.4 ms
Lead Channel Setting Sensing Sensitivity: 2 mV

## 2019-08-25 NOTE — Progress Notes (Signed)
Remote pacemaker transmission.   

## 2019-09-03 ENCOUNTER — Ambulatory Visit: Payer: Self-pay | Admitting: Urology

## 2019-09-15 ENCOUNTER — Telehealth: Payer: Self-pay | Admitting: Urology

## 2019-09-15 NOTE — Telephone Encounter (Signed)
Patient needs a follow up appointment with Dr. [name] sooner than February.  He has high risk prostate cancer and a history of upper tract urothelial/bladder cancer.  He has not had a recent PSA and he did not have his recommended cystoscopy.  He needs to be seen ASAP.

## 2019-09-15 NOTE — Telephone Encounter (Signed)
Spoke to patient and he states he will not schedule a sooner appointment. He is not going to hae the Cysto done unless he knows he has a problem. He states his primary care provider did a urine sample and it looked fine. He is keeping the February appointment and will not come in sooner.

## 2019-11-21 ENCOUNTER — Ambulatory Visit (INDEPENDENT_AMBULATORY_CARE_PROVIDER_SITE_OTHER): Payer: Medicare Other

## 2019-11-21 DIAGNOSIS — I441 Atrioventricular block, second degree: Secondary | ICD-10-CM

## 2019-11-21 LAB — CUP PACEART REMOTE DEVICE CHECK
Battery Impedance: 721 Ohm
Battery Remaining Longevity: 66 mo
Battery Voltage: 2.78 V
Brady Statistic AP VP Percent: 62 %
Brady Statistic AP VS Percent: 1 %
Brady Statistic AS VP Percent: 37 %
Brady Statistic AS VS Percent: 1 %
Date Time Interrogation Session: 20211119084249
Implantable Lead Implant Date: 20150903
Implantable Lead Implant Date: 20150903
Implantable Lead Location: 753859
Implantable Lead Location: 753860
Implantable Lead Model: 5076
Implantable Lead Model: 5076
Implantable Pulse Generator Implant Date: 20150903
Lead Channel Impedance Value: 451 Ohm
Lead Channel Impedance Value: 500 Ohm
Lead Channel Pacing Threshold Amplitude: 0.625 V
Lead Channel Pacing Threshold Amplitude: 0.75 V
Lead Channel Pacing Threshold Pulse Width: 0.4 ms
Lead Channel Pacing Threshold Pulse Width: 0.4 ms
Lead Channel Setting Pacing Amplitude: 2 V
Lead Channel Setting Pacing Amplitude: 2.5 V
Lead Channel Setting Pacing Pulse Width: 0.4 ms
Lead Channel Setting Sensing Sensitivity: 2 mV

## 2019-11-24 NOTE — Progress Notes (Signed)
Remote pacemaker transmission.   

## 2020-02-20 ENCOUNTER — Ambulatory Visit (INDEPENDENT_AMBULATORY_CARE_PROVIDER_SITE_OTHER): Payer: Medicare Other

## 2020-02-20 DIAGNOSIS — I441 Atrioventricular block, second degree: Secondary | ICD-10-CM | POA: Diagnosis not present

## 2020-02-21 LAB — CUP PACEART REMOTE DEVICE CHECK
Battery Impedance: 797 Ohm
Battery Remaining Longevity: 63 mo
Battery Voltage: 2.78 V
Brady Statistic AP VP Percent: 61 %
Brady Statistic AP VS Percent: 1 %
Brady Statistic AS VP Percent: 37 %
Brady Statistic AS VS Percent: 1 %
Date Time Interrogation Session: 20220218124550
Implantable Lead Implant Date: 20150903
Implantable Lead Implant Date: 20150903
Implantable Lead Location: 753859
Implantable Lead Location: 753860
Implantable Lead Model: 5076
Implantable Lead Model: 5076
Implantable Pulse Generator Implant Date: 20150903
Lead Channel Impedance Value: 471 Ohm
Lead Channel Impedance Value: 520 Ohm
Lead Channel Pacing Threshold Amplitude: 0.75 V
Lead Channel Pacing Threshold Amplitude: 0.75 V
Lead Channel Pacing Threshold Pulse Width: 0.4 ms
Lead Channel Pacing Threshold Pulse Width: 0.4 ms
Lead Channel Setting Pacing Amplitude: 2 V
Lead Channel Setting Pacing Amplitude: 2.5 V
Lead Channel Setting Pacing Pulse Width: 0.4 ms
Lead Channel Setting Sensing Sensitivity: 2 mV

## 2020-02-24 ENCOUNTER — Ambulatory Visit (INDEPENDENT_AMBULATORY_CARE_PROVIDER_SITE_OTHER): Payer: Medicare Other | Admitting: Urology

## 2020-02-24 ENCOUNTER — Encounter: Payer: Self-pay | Admitting: Urology

## 2020-02-24 ENCOUNTER — Other Ambulatory Visit: Payer: Self-pay

## 2020-02-24 VITALS — BP 131/67 | HR 79 | Ht 66.0 in | Wt 200.0 lb

## 2020-02-24 DIAGNOSIS — Z8551 Personal history of malignant neoplasm of bladder: Secondary | ICD-10-CM

## 2020-02-24 DIAGNOSIS — C61 Malignant neoplasm of prostate: Secondary | ICD-10-CM | POA: Diagnosis not present

## 2020-02-24 LAB — URINALYSIS, COMPLETE
Bilirubin, UA: NEGATIVE
Glucose, UA: NEGATIVE
Ketones, UA: NEGATIVE
Leukocytes,UA: NEGATIVE
Nitrite, UA: NEGATIVE
Protein,UA: NEGATIVE
RBC, UA: NEGATIVE
Specific Gravity, UA: 1.015 (ref 1.005–1.030)
Urobilinogen, Ur: 0.2 mg/dL (ref 0.2–1.0)
pH, UA: 5.5 (ref 5.0–7.5)

## 2020-02-24 LAB — MICROSCOPIC EXAMINATION: Bacteria, UA: NONE SEEN

## 2020-02-24 MED ORDER — LEUPROLIDE ACETATE (6 MONTH) 45 MG ~~LOC~~ KIT
45.0000 mg | PACK | Freq: Once | SUBCUTANEOUS | Status: AC
  Administered 2020-02-24: 45 mg via SUBCUTANEOUS

## 2020-02-24 NOTE — Progress Notes (Signed)
Remote pacemaker transmission.   

## 2020-02-24 NOTE — Progress Notes (Signed)
02/24/2020 2:48 PM   Jacob [name] May 10, 1928 [id number]  Referring provider: [name], [address] [place],  [place] [address]  Chief Complaint  Patient presents with  . Prostate Cancer    HPI: 87 year old male with multiple GU issues who returns today for follow-up.  He has a personal history of prostate cancer.   Notably, the patient was noted to have a rising PSA.  Risen from 4.5 in 2018 most recently up to 10.6 on 4/20 with a nodular prostate on exam.  In addition to this, he had patchy nonfocal uptake on PET on 09/2017.  He underwent prostate biopsy on 06/04/2018 and returns today via telephone visit to discuss biopsy results.  Biopsy shows 7 of 12 cores positive for prostate cancer involving all cores on the left up to 100% of Gleason 4+5 and 4+3 as well as a single core of Gleason 3+3 at the right lateral base.  T2b, iPSA 10.      After lengthy discussion regarding treatment options, he elected hormones only given his advanced age and comorbidities.  He has been tolerating this fair.  He is having some hot flashes but otherwise has no urinary issues.  PSA is overdue today.  Drawn and pending.  He also has a personal history of left renal pelvic cancer status post nephro ureterectomy in 08/2013 as well as bladder cancer.  His last recurrence was in 2016.  Please see previous note dated 05/2018 for details.  Ultimately after further discussion, he is elected to pursue no further surveillance or follow-up for this.  He reports today that he believes that if he has recurrence, blood will "turn up" in his urine.  He denies any gross hematuria.  Urinalysis today is also negative.  He is elected to defer further cystoscopy.   PMH: Past Medical History:  Diagnosis Date  . Arthritis    hands  . At risk for sleep apnea    STOP-BANG=  4     SENT TO PCP [id number]  . BPH (benign prostatic hypertrophy)   . Coronary artery disease cardiologist-- dr Fletcher Anon  and dr gregg  taylor   a. 2006 s/p PCI/Stenting x 2 to the RCA;  b. 05/03/12 Cardiac CT: Patent RCA stent, nonobs dzs;  c. 01/2013 Ex MV: nl EF, no ischemia.  Marland Kitchen GERD (gastroesophageal reflux disease)   . Glaucoma of both eyes   . H/O hiatal hernia   . History of atrial fibrillation    a. 2006.  Marland Kitchen History of diverticulitis   . History of kidney stones   . History of renal cell carcinoma    08-22-2013  s/p  left nephrouretectomy  . Hyperlipidemia   . Hyperlipidemia   . Hypertension   . LBBB (left bundle branch block)   . Mobitz type 2 second degree AV block   . Recurrent bladder transitional cell carcinoma ([place]) monitored by dr [name]   s/p turb's  and chemo bladder instillations  . Renal insufficiency    a. Creat rose to 2.34 post-op L nephrectomy.  . S/P placement of cardiac pacemaker [employer] last pacer check 12-01-2013   08-29-2013  for symptomatic bradycardia and mobitz 2 second degree heart block  . Symptomatic bradycardia    s/p  pacemaker placement 08-29-2013    Surgical History: Past Surgical History:  Procedure Laterality Date  . APPENDECTOMY  02/1988  . CATARACT EXTRACTION W/ INTRAOCULAR LENS  IMPLANT, BILATERAL  2011  . CORONARY ANGIOPLASTY WITH STENT PLACEMENT  2006  [facility]   X2 STENTS  TO RCA  . CYSTOSCOPY W/ RETROGRADES Right 01/13/2014   Procedure: CYSTOSCOPY WITH RETROGRADE PYELOGRAM;  Surgeon: [name], MD;  Location: [facility];  Service: Urology;  Laterality: Right;  . CYSTOSCOPY W/ URETERAL STENT PLACEMENT Left 08/22/2013   Procedure: CYSTOSCOPY WITH RETROGRADE PYELOGRAM/URETERAL STENT PLACEMENT;  Surgeon: [name], MD;  Location: [facility];  Service: Urology;  Laterality: Left;  . CYSTOSCOPY WITH BIOPSY  04/13/2011     Procedure: CYSTOSCOPY WITH BIOPSY;  Surgeon: [name], MD;  Location: [facility];  Service: Urology;  Laterality: N/A;  Cold Cup Biopsy  . CYSTOSCOPY WITH RETROGRADE PYELOGRAM, URETEROSCOPY AND STENT PLACEMENT Left  06/19/2013   Procedure: CYSTOSCOPY WITH LEFT RETROGRADE PYELOGRAM, LEFT URETEROSCOPY, ureteral balloon dilatation, BIOPSY LEFT KIDNEY;  Surgeon: [name], MD;  Location[facility];  Service: Urology;  Laterality: Left;  . INGUINAL HERNIA REPAIR Left 08/22/2013   Procedure: LAPAROSCOPIC INGUINAL HERNIA;  Surgeon: [name], MD;  Location: [facility];  Service: Urology;  Laterality: Left;  . PERMANENT PACEMAKER INSERTION N/A 08/29/2013   Procedure: PERMANENT PACEMAKER INSERTION;  Surgeon: [name], MD;  Location: [facility];  Service: Cardiovascular;  Laterality: N/A;  [employer] dual-chamber  . ROBOT ASSITED LAPAROSCOPIC NEPHROURETERECTOMY Left 08/22/2013   Procedure: ROBOT ASSITED LAPAROSCOPIC NEPHROURETERECTOMY;  Surgeon: [name], MD;  Location: [facility];  Service: Urology;  Laterality: Left;  . TRANSTHORACIC ECHOCARDIOGRAM  08-29-2013   mild LVH/  ef 60-65% /  grade I diastolic dysfunction/  mild MR/  mild LAE  and RAE/  moderate TR  . TRANSURETHRAL RESECTION OF BLADDER TUMOR  12-22-2010  . TRANSURETHRAL RESECTION OF BLADDER TUMOR  08/04/2011   Procedure: TRANSURETHRAL RESECTION OF BLADDER TUMOR (TURBT);  Surgeon: [name], MD;  Location: [facility];  Service: Urology;  Laterality: N/A;  . TRANSURETHRAL RESECTION OF BLADDER TUMOR N/A 01/13/2014   Procedure: TRANSURETHRAL RESECTION OF BLADDER TUMOR (TURBT);  Surgeon: [name], MD;  Location: [facility];  Service: Urology;  Laterality: N/A;    Home Medications:  Allergies as of 02/24/2020      Reactions   Penicillins Hives   BLISTERS      Medication List       Accurate as of February 24, 2020  2:48 PM. If you have any questions, ask your nurse or doctor.        STOP taking these medications   furosemide 20 MG tablet Commonly known as: LASIX Stopped by: [name], MD     TAKE these medications   acetaminophen 325 MG tablet Commonly known as:  TYLENOL Take 650 mg by mouth every 4 (four) hours as needed.   aspirin 81 MG tablet Take 81 mg by mouth daily.   cetirizine 10 MG tablet Commonly known as: ZYRTEC Take 10 mg by mouth daily.   Dexilant 60 MG capsule Generic drug: dexlansoprazole Take 60 mg by mouth daily.   Fish Oil 1200 MG Cpdr Take by mouth.   isosorbide mononitrate 30 MG 24 hr tablet Commonly known as: IMDUR Take 30 mg by mouth at bedtime.   latanoprost 0.005 % ophthalmic solution Commonly known as: XALATAN Place 1 drop into both eyes at bedtime.   losartan 50 MG tablet Commonly known as: COZAAR Take 1 tablet by mouth every morning.   lovastatin 20 MG tablet Commonly known as: MEVACOR Take 20 mg by mouth at bedtime.   metoprolol tartrate 25 MG tablet Commonly  known as: LOPRESSOR Take 12.5 mg by mouth every 12 (twelve) hours.   multivitamins ther. w/minerals Tabs tablet Take 1 tablet by mouth daily.   ProAir HFA 108 (90 Base) MCG/ACT inhaler Generic drug: albuterol Inhale 1 puff into the lungs every 4 (four) hours as needed.   senna-docusate 8.6-50 MG tablet Commonly known as: Senokot-S Take 1 tablet by mouth 2 (two) times daily. While taking pain meds to prevent constipation   Symbicort 160-4.5 MCG/ACT inhaler Generic drug: budesonide-formoterol INHALE 2 PUFFS INTO LUNGS TWICE DAILY. RINSE MOUTH AFTER USE.       Allergies:  Allergies  Allergen Reactions  . Penicillins Hives    BLISTERS    Family History: Family History  Problem Relation Age of Onset  . Heart attack Mother   . Heart attack Father   . Prostate cancer Neg Hx   . Bladder Cancer Neg Hx   . Kidney cancer Neg Hx     Social History:  reports that he quit smoking about 19 years ago. His smoking use included cigarettes. He has a 50.00 pack-year smoking history. He has never used smokeless tobacco. He reports that he does not drink alcohol and does not use drugs.   Physical Exam: BP 131/67   Pulse 79   Ht 5\' 6"   (1.676 m)   Wt 200 lb (90.7 kg)   BMI 32.28 kg/m   Constitutional:  Alert and oriented, No acute distress.  In wheelchair. HEENT: [place] AT, moist mucus membranes.  Trachea midline, no masses. Cardiovascular: No clubbing, cyanosis, or edema. Respiratory: Normal respiratory effort, no increased work of breathing. Skin: No rashes, bruises or suspicious lesions. Neurologic: Grossly intact, no focal deficits, moving all 4 extremities.   Assessment & Plan:    1. Prostate cancer [facility]) PSA today is pending  Personal history of high risk presumed local prostate cancer (although never underwent metastatic work-up based on shared decision making) on hormone only treatment given his advanced age and comorbidities  We discussed continuation of these hormones including risk and benefits.  Given his high risk disease, I do think he would benefit for continuation as a seem to be fairly well-tolerated.  He is agreement with this and agreed for another 20-month Depo today.  We again reviewed the rationale for this treatment management.  He strongly continues fever no other additional intervention. - PSA; Future - PSA - leuprolide (6 Month) (ELIGARD) injection 45 mg - PSA; Future  2. History of bladder cancer Personal history of both upper tract urothelial as well as bladder cancer with last recurrence in 2016  Has not had a recent surveillance cystoscopy and has elected to defer further cystoscopy  Urinalysis today was negative which is somewhat reassuring however I was very clear today that if he does have recurrence of the bladder, it may not be detected using this technique and strongly would recommend consideration of cystoscopy if he would intervene with recurrence.  He is elected not to pursue cystoscopy.  - Urinalysis, Complete   Return in about 6 months (around 08/23/2020) for 7mo w/PSA.  [name], MD  [facility] [address], [place],   [address] [phone]

## 2020-02-25 ENCOUNTER — Telehealth: Payer: Self-pay | Admitting: *Deleted

## 2020-02-25 LAB — PSA: Prostate Specific Ag, Serum: <0.1 ng/mL (ref 0.0–4.0)

## 2020-02-25 NOTE — Telephone Encounter (Addendum)
Left VM asked to return call with any questions  ----- Message from [name], MD sent at 02/25/2020  8:54 AM EST ----- PSA remains undetectable, great news

## 2020-05-21 ENCOUNTER — Ambulatory Visit (INDEPENDENT_AMBULATORY_CARE_PROVIDER_SITE_OTHER): Payer: Medicare Other

## 2020-05-21 DIAGNOSIS — I441 Atrioventricular block, second degree: Secondary | ICD-10-CM

## 2020-05-24 LAB — CUP PACEART REMOTE DEVICE CHECK
Battery Impedance: 874 Ohm
Battery Remaining Longevity: 59 mo
Battery Voltage: 2.78 V
Brady Statistic AP VP Percent: 61 %
Brady Statistic AP VS Percent: 1 %
Brady Statistic AS VP Percent: 38 %
Brady Statistic AS VS Percent: 1 %
Date Time Interrogation Session: 20220520085958
Implantable Lead Implant Date: 20150903
Implantable Lead Implant Date: 20150903
Implantable Lead Location: 753859
Implantable Lead Location: 753860
Implantable Lead Model: 5076
Implantable Lead Model: 5076
Implantable Pulse Generator Implant Date: 20150903
Lead Channel Impedance Value: 464 Ohm
Lead Channel Impedance Value: 487 Ohm
Lead Channel Pacing Threshold Amplitude: 0.75 V
Lead Channel Pacing Threshold Amplitude: 0.875 V
Lead Channel Pacing Threshold Pulse Width: 0.4 ms
Lead Channel Pacing Threshold Pulse Width: 0.4 ms
Lead Channel Setting Pacing Amplitude: 2 V
Lead Channel Setting Pacing Amplitude: 2.5 V
Lead Channel Setting Pacing Pulse Width: 0.4 ms
Lead Channel Setting Sensing Sensitivity: 2 mV

## 2020-05-25 ENCOUNTER — Encounter: Payer: Self-pay | Admitting: Internal Medicine

## 2020-05-25 ENCOUNTER — Ambulatory Visit (INDEPENDENT_AMBULATORY_CARE_PROVIDER_SITE_OTHER): Payer: Medicare Other | Admitting: Internal Medicine

## 2020-05-25 ENCOUNTER — Other Ambulatory Visit: Payer: Self-pay

## 2020-05-25 VITALS — BP 144/74 | HR 71 | Ht 66.0 in | Wt 183.0 lb

## 2020-05-25 DIAGNOSIS — I441 Atrioventricular block, second degree: Secondary | ICD-10-CM

## 2020-05-25 DIAGNOSIS — I10 Essential (primary) hypertension: Secondary | ICD-10-CM

## 2020-05-25 DIAGNOSIS — I2510 Atherosclerotic heart disease of native coronary artery without angina pectoris: Secondary | ICD-10-CM

## 2020-05-25 DIAGNOSIS — R0602 Shortness of breath: Secondary | ICD-10-CM

## 2020-05-25 DIAGNOSIS — R001 Bradycardia, unspecified: Secondary | ICD-10-CM | POA: Diagnosis not present

## 2020-05-25 DIAGNOSIS — Z950 Presence of cardiac pacemaker: Secondary | ICD-10-CM

## 2020-05-25 LAB — PACEMAKER DEVICE OBSERVATION

## 2020-05-25 NOTE — Patient Instructions (Addendum)
Medication Instructions:  - Your physician recommends that you continue on your current medications as directed. Please refer to the Current Medication list given to you today.  *If you need a refill on your cardiac medications before your next appointment, please call your pharmacy*   Lab Work: - Your physician recommends that you have lab work today: :LDL  If you have labs (blood work) drawn today and your tests are completely normal, you will receive your results only by: Marland Kitchen MyChart Message (if you have MyChart) OR . A paper copy in the mail If you have any lab test that is abnormal or we need to change your treatment, we will call you to review the results.   Testing/Procedures:  1) Echocardiogram: - Your physician has requested that you have an echocardiogram. Echocardiography is a painless test that uses sound waves to create images of your heart. It provides your doctor with information about the size and shape of your heart and how well your heart's chambers and valves are working. This procedure takes approximately one hour. There are no restrictions for this procedure. There is a possibility that an IV may need to be started during your test to inject an image enhancing agent. This is done to obtain more optimal pictures of your heart. Therefore we ask that you do at least drink some water prior to coming in to hydrate your veins.    2) Lexiscan Myoview (Chemical Stress test): Your physician has requested that you have a lexiscan myoview.   [place]  Your caregiver has ordered a Stress Test with nuclear imaging. The purpose of this test is to evaluate the blood supply to your heart muscle. This procedure is referred to as a "Non-Invasive Stress Test." This is because other than having an IV started in your vein, nothing is inserted or "invades" your body. Cardiac stress tests are done to find areas of poor blood flow to the heart by determining the extent of coronary artery  disease (CAD). Some patients exercise on a treadmill, which naturally increases the blood flow to your heart, while others who are  unable to walk on a treadmill due to physical limitations have a pharmacologic/chemical stress agent called Lexiscan . This medicine will mimic walking on a treadmill by temporarily increasing your coronary blood flow.   Please note: these test may take anywhere between 2-4 hours to complete  PLEASE REPORT TO [facility] AT THE FIRST DESK WILL DIRECT YOU WHERE TO GO  Date of Procedure:_____________________________________  Arrival Time for Procedure:______________________________  Instructions regarding medication:   _x___ : Hold METOPROLOL the night before & morning of your test  __x__:  You may take all of your other regular morning medications the day of your test with enough water to get them down safely  PLEASE NOTIFY THE OFFICE AT LEAST 24 HOURS IN ADVANCE IF YOU ARE UNABLE TO [place].  [phone] AND  PLEASE NOTIFY NUCLEAR MEDICINE AT [facility] AT LEAST 24 HOURS IN ADVANCE IF YOU ARE UNABLE TO KEEP YOUR APPOINTMENT. [phone]  How to prepare for your Myoview test:  1. Do not eat or drink after midnight 2. No caffeine for 24 hours prior to test 3. No smoking 24 hours prior to test. 4. Your medication may be taken with water.  If your doctor stopped a medication because of this test, do not take that medication. 5. Ladies, please do not wear dresses.  Skirts or pants are appropriate. Please  wear a short sleeve shirt. 6. No perfume, cologne or lotion. 7. Wear comfortable walking shoes. No heels!   Follow-Up: At [facility], you and your health needs are our priority.  As part of our continuing mission to provide you with exceptional heart care, we have created designated Provider Care Teams.  These Care Teams include your primary Cardiologist (physician) and Advanced Practice Providers (APPs -   Physician Assistants and Nurse Practitioners) who all work together to provide you with the care you need, when you need it.  We recommend signing up for the patient portal called "MyChart".  Sign up information is provided on this After Visit Summary.  MyChart is used to connect with patients for Virtual Visits (Telemedicine).  Patients are able to view lab/test results, encounter notes, upcoming appointments, etc.  Non-urgent messages can be sent to your provider as well.   To learn more about what you can do with MyChart, go to [email or web address].    Your next appointment:   4 week(s)  The format for your next appointment:   Virtual Visit   Provider:   Virl Axe, MD   Other Instructions  1) Wear an abdominal binder during your waking hours to help support your blood pressure from dropping too much while changing position    Echocardiogram An echocardiogram is a test that uses sound waves (ultrasound) to produce images of the heart. Images from an echocardiogram can provide important information about:  Heart size and shape.  The size and thickness and movement of your heart's walls.  Heart muscle function and strength.  Heart valve function or if you have stenosis. Stenosis is when the heart valves are too narrow.  If blood is flowing backward through the heart valves (regurgitation).  A tumor or infectious growth around the heart valves.  Areas of heart muscle that are not working well because of poor blood flow or injury from a heart attack.  Aneurysm detection. An aneurysm is a weak or damaged part of an artery wall. The wall bulges out from the normal force of blood pumping through the body. Tell a health care provider about:  Any allergies you have.  All medicines you are taking, including vitamins, herbs, eye drops, creams, and over-the-counter medicines.  Any blood disorders you have.  Any surgeries you have had.  Any medical conditions you  have.  Whether you are pregnant or may be pregnant. What are the risks? Generally, this is a safe test. However, problems may occur, including an allergic reaction to dye (contrast) that may be used during the test. What happens before the test? No specific preparation is needed. You may eat and drink normally. What happens during the test?  You will take off your clothes from the waist up and put on a hospital gown.  Electrodes or electrocardiogram (ECG)patches may be placed on your chest. The electrodes or patches are then connected to a device that monitors your heart rate and rhythm.  You will lie down on a table for an ultrasound exam. A gel will be applied to your chest to help sound waves pass through your skin.  A handheld device, called a transducer, will be pressed against your chest and moved over your heart. The transducer produces sound waves that travel to your heart and bounce back (or "echo" back) to the transducer. These sound waves will be captured in real-time and changed into images of your heart that can be viewed on a video monitor. The images will be  recorded on a computer and reviewed by your health care provider.  You may be asked to change positions or hold your breath for a short time. This makes it easier to get different views or better views of your heart.  In some cases, you may receive contrast through an IV in one of your veins. This can improve the quality of the pictures from your heart. The procedure may vary among health care providers and hospitals.   What can I expect after the test? You may return to your normal, everyday life, including diet, activities, and medicines, unless your health care provider tells you not to do that. Follow these instructions at home:  It is up to you to get the results of your test. Ask your health care provider, or the department that is doing the test, when your results will be ready.  Keep all follow-up visits. This is  important. Summary  An echocardiogram is a test that uses sound waves (ultrasound) to produce images of the heart.  Images from an echocardiogram can provide important information about the size and shape of your heart, heart muscle function, heart valve function, and other possible heart problems.  You do not need to do anything to prepare before this test. You may eat and drink normally.  After the echocardiogram is completed, you may return to your normal, everyday life, unless your health care provider tells you not to do that. This information is not intended to replace advice given to you by your health care provider. Make sure you discuss any questions you have with your health care provider. Document Revised: 08/12/2019 Document Reviewed: 08/12/2019 Elsevier Patient Education  2021 [place].     Cardiac Nuclear Scan A cardiac nuclear scan is a test that is done to check the flow of blood to your heart. It is done when you are resting and when you are exercising. The test looks for problems such as:  Not enough blood reaching a portion of the heart.  The heart muscle not working as it should. You may need this test if:  You have heart disease.  You have had lab results that are not normal.  You have had heart surgery or a balloon procedure to open up blocked arteries (angioplasty).  You have chest pain.  You have shortness of breath. In this test, a special dye (tracer) is put into your bloodstream. The tracer will travel to your heart. A camera will then take pictures of your heart to see how the tracer moves through your heart. This test is usually done at a hospital and takes 2-4 hours. Tell a doctor about:  Any allergies you have.  All medicines you are taking, including vitamins, herbs, eye drops, creams, and over-the-counter medicines.  Any problems you or family members have had with anesthetic medicines.  Any blood disorders you have.  Any surgeries  you have had.  Any medical conditions you have.  Whether you are pregnant or may be pregnant. What are the risks? Generally, this is a safe test. However, problems may occur, such as:  Serious chest pain and heart attack. This is only a risk if the stress portion of the test is done.  Rapid heartbeat.  A feeling of warmth in your chest. This feeling usually does not last long.  Allergic reaction to the tracer. What happens before the test?  Ask your doctor about changing or stopping your normal medicines. This is important.  Follow instructions from your doctor  about what you cannot eat or drink.  Remove your jewelry on the day of the test. What happens during the test?  An IV tube will be inserted into one of your veins.  Your doctor will give you a small amount of tracer through the IV tube.  You will wait for 20-40 minutes while the tracer moves through your bloodstream.  Your heart will be monitored with an electrocardiogram (ECG).  You will lie down on an exam table.  Pictures of your heart will be taken for about 15-20 minutes.  You may also have a stress test. For this test, one of these things may be done: ? You will be asked to exercise on a treadmill or a stationary bike. ? You will be given medicines that will make your heart work harder. This is done if you are unable to exercise.  When blood flow to your heart has peaked, a tracer will again be given through the IV tube.  After 20-40 minutes, you will get back on the exam table. More pictures will be taken of your heart.  Depending on the tracer that is used, more pictures may need to be taken 3-4 hours later.  Your IV tube will be removed when the test is over. The test may vary among doctors and hospitals. What happens after the test?  Ask your doctor: ? Whether you can return to your normal schedule, including diet, activities, and medicines. ? Whether you should drink more fluids. This will help to  remove the tracer from your body. Drink enough fluid to keep your pee (urine) pale yellow.  Ask your doctor, or the department that is doing the test: ? When will my results be ready? ? How will I get my results? Summary  A cardiac nuclear scan is a test that is done to check the flow of blood to your heart.  Tell your doctor whether you are pregnant or may be pregnant.  Before the test, ask your doctor about changing or stopping your normal medicines. This is important.  Ask your doctor whether you can return to your normal activities. You may be asked to drink more fluids. This information is not intended to replace advice given to you by your health care provider. Make sure you discuss any questions you have with your health care provider. Document Revised: 04/10/2018 Document Reviewed: 06/04/2017 Elsevier Patient Education  [place].

## 2020-05-25 NOTE — Progress Notes (Signed)
Patient Care Team: [name], MD as PCP - General (Internal Medicine)   HPI  Jacob [name] is a 87 y.o. male Seen in follow-up for a pacemaker implanted 8/15 by Dr. Elliot Cousin for symptomatic bradycardia and 2-1 heart block.  He had an antecedent history of left bundle branch block and coronary artery disease with stents x 2 at [employer] [facility] 2006--prev preserved LV function  DATE TEST EF   2014 [facility])  "patent RCA stents"  2/15 Myoview  "suspicious for ischemia" Dr MA "no...ischemia"  8/15 Echo   55-65 %   3/17 Echo (CE)  55%    He has a history of atrial fibrillation and hypertension.  He was discharged with aspirin;  no atrial fibrillation however has been recorded on his device.   The patient denies nocturnal dyspnea, orthopneA.  There have been no palpitations or syncope.  Complains of vague chest discomfort primarily upon lying not with exertion.  Unassociated with a brackish taste.  Dyspnea on exertion which has been worse over the last 6-12 months unassociated with changes of edema or nocturnal dyspnea or orthopnea.  He remembers that he is presentation with ischemia 15 years ago was with dyspnea and without chest pain.  He has also had wobbliness when he stands, weakness in his legs aggravated by showers.  Does not recall having assessment for orthostasis.  No recent cholesterol measurements of which she is aware.    He has a history of renal cell carcinoma for which he underwent nephrectomy.  Modest renal insufficiency followed by nephrology   Date Cr K LDL Hgb  3/18 2.27 4.8  12.9   1/19 1.92 5.0  13.1  3/22 2.12 5.1  10.6 (12/21)       Past Medical History:  Diagnosis Date  . Arthritis    hands  . At risk for sleep apnea    STOP-BANG=  4     SENT TO PCP 06-19-5013  . BPH (benign prostatic hypertrophy)   . Coronary artery disease cardiologist-- dr Fletcher Anon  and dr gregg taylor   a. 2006 s/p PCI/Stenting x 2 to the RCA;  b. 05/03/12 Cardiac CT: Patent RCA stent,  nonobs dzs;  c. 01/2013 Ex MV: nl EF, no ischemia.  Marland Kitchen GERD (gastroesophageal reflux disease)   . Glaucoma of both eyes   . H/O hiatal hernia   . History of atrial fibrillation    a. 2006.  Marland Kitchen History of diverticulitis   . History of kidney stones   . History of renal cell carcinoma    08-22-2013  s/p  left nephrouretectomy  . Hyperlipidemia   . Hyperlipidemia   . Hypertension   . LBBB (left bundle branch block)   . Mobitz type 2 second degree AV block   . Recurrent bladder transitional cell carcinoma ([place]) monitored by dr [name]   s/p turb's  and chemo bladder instillations  . Renal insufficiency    a. Creat rose to 2.34 post-op L nephrectomy.  . S/P placement of cardiac pacemaker MEDTRONIC last pacer check 12-01-2013   08-29-2013  for symptomatic bradycardia and mobitz 2 second degree heart block  . Symptomatic bradycardia    s/p  pacemaker placement 08-29-2013    Past Surgical History:  Procedure Laterality Date  . APPENDECTOMY  02/1988  . CATARACT EXTRACTION W/ INTRAOCULAR LENS  IMPLANT, BILATERAL  2011  . CORONARY ANGIOPLASTY WITH STENT PLACEMENT  2006    [facility]   X2 STENTS  TO RCA  .  CYSTOSCOPY W/ RETROGRADES Right 01/13/2014   Procedure: CYSTOSCOPY WITH RETROGRADE PYELOGRAM;  Surgeon: [name], MD;  Location: [facility];  Service: Urology;  Laterality: Right;  . CYSTOSCOPY W/ URETERAL STENT PLACEMENT Left 08/22/2013   Procedure: CYSTOSCOPY WITH RETROGRADE PYELOGRAM/URETERAL STENT PLACEMENT;  Surgeon: [name], MD;  Location: [facility];  Service: Urology;  Laterality: Left;  . CYSTOSCOPY WITH BIOPSY  04/13/2011     Procedure: CYSTOSCOPY WITH BIOPSY;  Surgeon: [name], MD;  Location: [facility];  Service: Urology;  Laterality: N/A;  Cold Cup Biopsy  . CYSTOSCOPY WITH RETROGRADE PYELOGRAM, URETEROSCOPY AND STENT PLACEMENT Left 06/19/2013   Procedure: CYSTOSCOPY WITH LEFT RETROGRADE PYELOGRAM, LEFT URETEROSCOPY, ureteral balloon dilatation,  BIOPSY LEFT KIDNEY;  Surgeon: [name], MD;  [facility];  Service: Urology;  Laterality: Left;  . INGUINAL HERNIA REPAIR Left 08/22/2013   Procedure: LAPAROSCOPIC INGUINAL HERNIA;  Surgeon: [name], MD;  Location: [facility];  Service: Urology;  Laterality: Left;  . PERMANENT PACEMAKER INSERTION N/A 08/29/2013   Procedure: PERMANENT PACEMAKER INSERTION;  Surgeon: [name], MD;  [facility];  Service: Cardiovascular;  Laterality: N/A;  Medtronic dual-chamber  . ROBOT ASSITED LAPAROSCOPIC NEPHROURETERECTOMY Left 08/22/2013   Procedure: ROBOT ASSITED LAPAROSCOPIC NEPHROURETERECTOMY;  Surgeon: [name], MD;  Location: [facility];  Service: Urology;  Laterality: Left;  . TRANSTHORACIC ECHOCARDIOGRAM  08-29-2013   mild LVH/  ef 60-65% /  grade I diastolic dysfunction/  mild MR/  mild LAE  and RAE/  moderate TR  . TRANSURETHRAL RESECTION OF BLADDER TUMOR  12-22-2010  . TRANSURETHRAL RESECTION OF BLADDER TUMOR  08/04/2011   Procedure: TRANSURETHRAL RESECTION OF BLADDER TUMOR (TURBT);  Surgeon: [name], MD;  Location: [facility];  Service: Urology;  Laterality: N/A;  . TRANSURETHRAL RESECTION OF BLADDER TUMOR N/A 01/13/2014   Procedure: TRANSURETHRAL RESECTION OF BLADDER TUMOR (TURBT);  Surgeon: [name], MD;  Location: [facility];  Service: Urology;  Laterality: N/A;    Current Outpatient Medications  Medication Sig Dispense Refill  . acetaminophen (TYLENOL) 325 MG tablet Take 650 mg by mouth every 4 (four) hours as needed.    Marland Kitchen aspirin 81 MG tablet Take 81 mg by mouth daily.    . cetirizine (ZYRTEC) 10 MG tablet Take 10 mg by mouth daily.    Marland Kitchen esomeprazole (NEXIUM) 20 MG capsule Take 20 mg by mouth daily.    . FEROSUL 325 (65 Fe) MG tablet Take 325 mg by mouth daily.    . isosorbide mononitrate (IMDUR) 30 MG 24 hr tablet Take 30 mg by mouth at bedtime.    Marland Kitchen latanoprost (XALATAN) 0.005 % ophthalmic  solution Place 1 drop into both eyes at bedtime.    Marland Kitchen losartan (COZAAR) 50 MG tablet Take 1 tablet by mouth every morning.     . lovastatin (MEVACOR) 20 MG tablet Take 20 mg by mouth at bedtime.    . metoprolol tartrate (LOPRESSOR) 25 MG tablet Take 12.5 mg by mouth daily.    . Multiple Vitamins-Minerals (MULTIVITAMINS THER. W/MINERALS) TABS Take 1 tablet by mouth daily.    . Omega-3 Fatty Acids (FISH OIL) 1200 MG CPDR Take by mouth.    Marland Kitchen PROAIR HFA 108 (90 Base) MCG/ACT inhaler Inhale 1 puff into the lungs every 4 (four) hours as needed.     . senna-docusate (SENOKOT-S) 8.6-50 MG per tablet Take 1 tablet by mouth 2 (two) times daily. While taking pain meds  to prevent constipation 30 tablet 0  . SYMBICORT 160-4.5 MCG/ACT inhaler INHALE 2 PUFFS INTO LUNGS TWICE DAILY. RINSE MOUTH AFTER USE. 11 g 0   Current Facility-Administered Medications  Medication Dose Route Frequency Provider Last Rate Last Admin  . gentamicin (GARAMYCIN) injection 80 mg  80 mg Intramuscular Once [name], MD      . levofloxacin [facility]) tablet 500 mg  500 mg Oral Once [name], MD        Allergies  Allergen Reactions  . Penicillins Hives    BLISTERS      Review of Systems negative except from HPI and PMH  Physical Exam BP (!) 144/74 (BP Location: Left Arm, Patient Position: Sitting, Cuff Size: Normal)   Pulse 71   Ht 5\' 6"  (1.676 m)   Wt 183 lb (83 kg)   SpO2 94%   BMI 29.54 kg/m  Well developed and well nourished in no acute distress HENT normal Neck supple with JVP-6 cm Crackles left greater than right Device pocket well healed; without hematoma or erythema.  There is no tethering  Regular rate and rhythm, no  gallop 2/6 early systolic murmur Abd-soft with active BS No Clubbing cyanosis 1+ edema Skin-warm and dry A & Oriented  Grossly normal sensory and motor function  ECG AV pacing  Assessment and  Plan  Hypertension  Symptomatic bradycardia   Complete heart  block  Ventricular tachycardia-nonsustained/PVCs frequent  COPD-CT 3/21 shows "moderate centrilobular emphysema again noted "  Dyspnea on exertion  Coronary artery disease with prior stenting  Nonexertional chest pain  Orthostatic lightheadedness  Pacemaker-Medtronic      The patient has progressive dyspnea on exertion.  No significant evidence of volume overload on examination.  Differential diagnosis would include his emphysema, congestive heart failure either with normal or reduced left ventricular function occurring in the context of chronic pacing and/or progressive angina.  We will undertake an echocardiogram to look for LV dysfunction as well as Myoview for ischemia.  In the context of his coronary disease, he also needs assessment of his LDL.  It is possible that frequent PVCs are contributing to his cardiomyopathy although based on his device interrogation the numbers seem short.  I need to review with Medtronic which in the last 3 years data is collected in the event counters  His lung exam is abnormal.  CT scan 3/21 was reviewed.  Significance scarring is noted   Or immediately however, I am worried about orthostatic lightheadedness and parameters are concerning for modest orthostatic hypotension with a change of 158--132 upon standing.  We reviewed pharmacological and nonpharmacological strategies so as to limit drug drug interactions, have focused on the latter.  He has a back brace at home and so he will try that.  We discussed the physiology.    The time spend this day on the patients care with chart review and directly in consultation with the patient was 33 min.

## 2020-05-26 LAB — LDL CHOLESTEROL, DIRECT: LDL Direct: 84 mg/dL (ref 0–99)

## 2020-05-27 ENCOUNTER — Telehealth: Payer: Self-pay | Admitting: Internal Medicine

## 2020-05-27 MED ORDER — ATORVASTATIN CALCIUM 40 MG PO TABS: 40.0000 mg | ORAL_TABLET | Freq: Every day | ORAL | 6 refills | Status: DC

## 2020-05-27 NOTE — Telephone Encounter (Signed)
Jacob Sprang, MD  05/26/2020 6:18 PM EDT      Please Inform Patient that labs show LDL is not at target  an easy try would be to chnge his statin to atorva 40  Thanks

## 2020-05-27 NOTE — Telephone Encounter (Signed)
I called and spoke with the patient regarding his LDL results. He is aware this is higher than goal and Dr. Caryl Comes would like him to: 1) stop lovastatin 2) start atorvastatin 40 mg once daily  The patient voices understanding and is agreeable.

## 2020-06-07 NOTE — Progress Notes (Signed)
Remote pacemaker transmission.   

## 2020-06-09 ENCOUNTER — Other Ambulatory Visit: Payer: Self-pay

## 2020-06-09 ENCOUNTER — Encounter
Admission: RE | Admit: 2020-06-09 | Discharge: 2020-06-09 | Disposition: A | Discharge status: Home / Self Care | Payer: Medicare Other | Source: Ambulatory Visit | Attending: Internal Medicine | Admitting: Internal Medicine

## 2020-06-09 DIAGNOSIS — R0602 Shortness of breath: Secondary | ICD-10-CM | POA: Diagnosis not present

## 2020-06-09 DIAGNOSIS — I2510 Atherosclerotic heart disease of native coronary artery without angina pectoris: Secondary | ICD-10-CM | POA: Diagnosis not present

## 2020-06-09 LAB — NM MYOCAR MULTI W/SPECT W/WALL MOTION / EF
Estimated workload: 1 METS
Exercise duration (min): 0 min
Exercise duration (sec): 0 s
LV dias vol: 61 mL (ref 62–150)
LV sys vol: 47 mL
MPHR: 131 {beats}/min
Peak HR: 77 {beats}/min
Percent HR: 58 %
Rest HR: 64 {beats}/min
SDS: 0
SRS: 3
SSS: 1
TID: 0.89

## 2020-06-09 MED ORDER — TECHNETIUM TC 99M TETROFOSMIN IV KIT
31.4000 | PACK | Freq: Once | INTRAVENOUS | Status: AC | PRN
  Administered 2020-06-09: 31.4 via INTRAVENOUS

## 2020-06-09 MED ORDER — REGADENOSON 0.4 MG/5ML IV SOLN
0.4000 mg | Freq: Once | INTRAVENOUS | Status: AC
  Administered 2020-06-09: 0.4 mg via INTRAVENOUS

## 2020-06-09 MED ORDER — TECHNETIUM TC 99M TETROFOSMIN IV KIT
10.0000 | PACK | Freq: Once | INTRAVENOUS | Status: AC | PRN
  Administered 2020-06-09: 10.16 via INTRAVENOUS

## 2020-07-01 ENCOUNTER — Ambulatory Visit (INDEPENDENT_AMBULATORY_CARE_PROVIDER_SITE_OTHER): Payer: Medicare Other

## 2020-07-01 ENCOUNTER — Other Ambulatory Visit: Payer: Self-pay

## 2020-07-01 DIAGNOSIS — R0602 Shortness of breath: Secondary | ICD-10-CM | POA: Diagnosis not present

## 2020-07-01 LAB — ECHOCARDIOGRAM COMPLETE
AR max vel: 2.94 cm2
AV Area VTI: 2.66 cm2
AV Area mean vel: 2.33 cm2
AV Mean grad: 4 mmHg
AV Peak grad: 7.3 mmHg
Ao pk vel: 1.35 m/s
Area-P 1/2: 2.73 cm2
MV VTI: 1.39 cm2
S' Lateral: 3.5 cm
Single Plane A4C EF: 49.3 %

## 2020-07-06 ENCOUNTER — Telehealth (INDEPENDENT_AMBULATORY_CARE_PROVIDER_SITE_OTHER): Payer: Medicare Other | Admitting: Internal Medicine

## 2020-07-06 ENCOUNTER — Other Ambulatory Visit: Payer: Self-pay

## 2020-07-06 VITALS — BP 130/68 | Ht 66.0 in | Wt 187.0 lb

## 2020-07-06 DIAGNOSIS — I442 Atrioventricular block, complete: Secondary | ICD-10-CM

## 2020-07-06 DIAGNOSIS — I2510 Atherosclerotic heart disease of native coronary artery without angina pectoris: Secondary | ICD-10-CM

## 2020-07-06 DIAGNOSIS — Z950 Presence of cardiac pacemaker: Secondary | ICD-10-CM

## 2020-07-06 DIAGNOSIS — I10 Essential (primary) hypertension: Secondary | ICD-10-CM

## 2020-07-06 DIAGNOSIS — R0602 Shortness of breath: Secondary | ICD-10-CM

## 2020-07-06 DIAGNOSIS — R001 Bradycardia, unspecified: Secondary | ICD-10-CM | POA: Diagnosis not present

## 2020-07-06 NOTE — Progress Notes (Signed)
Patient ID: Jacob [name], male   DOB: 12-Oct-1928, 87 y.o.   MRN: [id number]     Electrophysiology TeleHealth Note   Due to national recommendations of social distancing due to COVID 19, an audio/video telehealth visit is felt to be most appropriate for this patient at this time.  See MyChart message from today for the patient's consent to telehealth for [facility].   Date:  07/06/2020   ID:  Jacob [name], DOB [birth year], MRN [id number]  Location: patient's home  Provider location: [address], [place]  Evaluation Performed: Follow-up visit  PCP:  [name], MD  Cardiologist:     Electrophysiologist:  SK   Chief Complaint:  shortness of breath   History of Present Illness:    Jacob [name] is a 87 y.o. male who presents via audio/video conferencing for a telehealth visit today.  Since last being seen in our clinic for progressive dyspnea and chest pain in setting of CABG with normal LV function, high grade heart block with prior pacing and orthostasis, without improvement  with back brace,  the patient reports breathing better     Interval Myoview shows significant interval worsening of LV function; not confirmed by echo   The patient denies symptoms of fevers, chills, cough, or new SOB worrisome for COVID 19.     Date   Cr          K        Hgb       LDL   05/22             84   6/22(CE) 1.81 4.3         10.6     . DATE TEST EF   3/17(CE) Echo  55 %   6/22 MyoView   25 % No ischemia  6/22 Echo  50-55%     Past Medical History:  Diagnosis Date   Arthritis    hands   At risk for sleep apnea    STOP-BANG=  4     SENT TO PCP [id number]   BPH (benign prostatic hypertrophy)    Coronary artery disease cardiologist-- dr Fletcher Anon  and dr gregg taylor   a. 2006 s/p PCI/Stenting x 2 to the RCA;  b. 05/03/12 Cardiac CT: Patent RCA stent, nonobs dzs;  c. 01/2013 Ex MV: nl EF, no ischemia.   GERD (gastroesophageal reflux disease)    Glaucoma of both  eyes    H/O hiatal hernia    History of atrial fibrillation    a. 2006.   History of diverticulitis    History of kidney stones    History of renal cell carcinoma    08-22-2013  s/p  left nephrouretectomy   Hyperlipidemia    Hyperlipidemia    Hypertension    LBBB (left bundle branch block)    Mobitz type 2 second degree AV block    Recurrent bladder transitional cell carcinoma ([place]) monitored by dr [name]   s/p turb's  and chemo bladder instillations   Renal insufficiency    a. Creat rose to 2.34 post-op L nephrectomy.   S/P placement of cardiac pacemaker MEDTRONIC last pacer check 12-01-2013   08-29-2013  for symptomatic bradycardia and mobitz 2 second degree heart block   Symptomatic bradycardia    s/p  pacemaker placement 08-29-2013    Past Surgical History:  Procedure Laterality Date   APPENDECTOMY  02/1988   CATARACT EXTRACTION W/ INTRAOCULAR LENS  IMPLANT, BILATERAL  2011   CORONARY ANGIOPLASTY WITH STENT PLACEMENT  2006    [facility]   X2 STENTS  TO RCA   CYSTOSCOPY W/ RETROGRADES Right 01/13/2014   Procedure: CYSTOSCOPY WITH RETROGRADE PYELOGRAM;  Surgeon: [name], MD;  Location: [facility];  Service: Urology;  Laterality: Right;   CYSTOSCOPY W/ URETERAL STENT PLACEMENT Left 08/22/2013   Procedure: CYSTOSCOPY WITH RETROGRADE PYELOGRAM/URETERAL STENT PLACEMENT;  Surgeon: [name], MD;  Location: [facility];  Service: Urology;  Laterality: Left;   CYSTOSCOPY WITH BIOPSY  04/13/2011     Procedure: CYSTOSCOPY WITH BIOPSY;  Surgeon: [name], MD;  Location: [facility];  Service: Urology;  Laterality: N/A;  Cold Cup Biopsy   CYSTOSCOPY WITH RETROGRADE PYELOGRAM, URETEROSCOPY AND STENT PLACEMENT Left 06/19/2013   Procedure: CYSTOSCOPY WITH LEFT RETROGRADE PYELOGRAM, LEFT URETEROSCOPY, ureteral balloon dilatation, BIOPSY LEFT KIDNEY;  Surgeon: [name], MD;  Location: [facility];  Service: Urology;  Laterality: Left;    INGUINAL HERNIA REPAIR Left 08/22/2013   Procedure: LAPAROSCOPIC INGUINAL HERNIA;  Surgeon: [name], MD;  Location: [facility];  Service: Urology;  Laterality: Left;   PERMANENT PACEMAKER INSERTION N/A 08/29/2013   Procedure: PERMANENT PACEMAKER INSERTION;  Surgeon: [name], MD;  Location: [facility];  Service: Cardiovascular;  Laterality: N/A;  Medtronic dual-chamber   ROBOT ASSITED LAPAROSCOPIC NEPHROURETERECTOMY Left 08/22/2013   Procedure: ROBOT ASSITED LAPAROSCOPIC NEPHROURETERECTOMY;  Surgeon: [name], MD;  Location: [facility];  Service: Urology;  Laterality: Left;   TRANSTHORACIC ECHOCARDIOGRAM  08-29-2013   mild LVH/  ef 60-65% /  grade I diastolic dysfunction/  mild MR/  mild LAE  and RAE/  moderate TR   TRANSURETHRAL RESECTION OF BLADDER TUMOR  12-22-2010   TRANSURETHRAL RESECTION OF BLADDER TUMOR  08/04/2011   Procedure: TRANSURETHRAL RESECTION OF BLADDER TUMOR (TURBT);  Surgeon: [name], MD;  Location: [facility];  Service: Urology;  Laterality: N/A;   TRANSURETHRAL RESECTION OF BLADDER TUMOR N/A 01/13/2014   Procedure: TRANSURETHRAL RESECTION OF BLADDER TUMOR (TURBT);  Surgeon: [name], MD;  Location: [facility];  Service: Urology;  Laterality: N/A;    Current Outpatient Medications  Medication Sig Dispense Refill   acetaminophen (TYLENOL) 325 MG tablet Take 650 mg by mouth every 4 (four) hours as needed.     aspirin 81 MG tablet Take 81 mg by mouth daily.     atorvastatin (LIPITOR) 40 MG tablet Take 1 tablet (40 mg total) by mouth daily. 30 tablet 6   cetirizine (ZYRTEC) 10 MG tablet Take 10 mg by mouth daily.     esomeprazole (NEXIUM) 20 MG capsule Take 20 mg by mouth daily.     FEROSUL 325 (65 Fe) MG tablet Take 325 mg by mouth daily.     isosorbide mononitrate (IMDUR) 30 MG 24 hr tablet Take 30 mg by mouth at bedtime.     latanoprost (XALATAN) 0.005 % ophthalmic solution Place 1 drop into both eyes at bedtime.      losartan (COZAAR) 50 MG tablet Take 1 tablet by mouth every morning.      Multiple Vitamins-Minerals (MULTIVITAMINS THER. W/MINERALS) TABS Take 1 tablet by mouth daily.     Omega-3 Fatty Acids (FISH OIL) 1200 MG CPDR Take by mouth.     PROAIR HFA 108 (90 Base) MCG/ACT inhaler Inhale 1 puff into the lungs every 4 (four) hours as needed.      senna-docusate (SENOKOT-S) 8.6-50 MG per tablet Take 1 tablet  by mouth 2 (two) times daily. While taking pain meds to prevent constipation 30 tablet 0   SYMBICORT 160-4.5 MCG/ACT inhaler INHALE 2 PUFFS INTO LUNGS TWICE DAILY. RINSE MOUTH AFTER USE. 11 g 0   Current Facility-Administered Medications  Medication Dose Route Frequency Provider Last Rate Last Admin   gentamicin (GARAMYCIN) injection 80 mg  80 mg Intramuscular Once [name], MD       levofloxacin [facility]) tablet 500 mg  500 mg Oral Once [name], MD        Allergies:   Penicillins   Social History:  The patient  reports that he quit smoking about 19 years ago. His smoking use included cigarettes. He has a 50.00 pack-year smoking history. He has never used smokeless tobacco. He reports that he does not drink alcohol and does not use drugs.   Family History:  The patient's   family history includes Heart attack in his father and mother.   ROS:  Please see the history of present illness.   All other systems are personally reviewed and negative.    Exam:    Vital Signs:  BP 130/68   Ht 5\' 6"  (1.676 m)   Wt 187 lb (84.8 kg)   BMI 30.18 kg/m       Labs/Other Tests and Data Reviewed:    Recent Labs: No results found for requested labs within last 8760 hours.   Wt Readings from Last 3 Encounters:  07/06/20 187 lb (84.8 kg)  05/25/20 183 lb (83 kg)  02/24/20 200 lb (90.7 kg)     Other studies personally reviewed: Additional studies/ records that were reviewed today include (As above)     ASSESSMENT & PLAN:    Hypertension   Symptomatic bradycardia    Complete  heart block   Ventricular tachycardia-nonsustained/PVCs frequent   COPD-CT 3/21 shows "moderate centrilobular emphysema again noted "   Dyspnea on exertion   Coronary artery disease with prior stenting   Nonexertional chest pain   Orthostatic lightheadedness   Pacemaker-Medtronic     Breathing better, might be candidate for SGLT-2 and will relay this option to his PCP  For now no cardiac med changes   COVID 19 screen The patient denies symptoms of COVID 19 at this time.  The importance of social distancing was discussed today.  Follow-up:  12 m    Current medicines are reviewed at length with the patient today.   The patient does not have concerns regarding his medicines.  The following changes were made today:  none  Labs/ tests ordered today include:   No orders of the defined types were placed in this encounter.      Patient Risk:  after full review of this patients clinical status, I feel that they are at moderate  risk at this time.  Today, I have spent 8 minutes with the patient with telehealth technology discussing the above.

## 2020-07-06 NOTE — Patient Instructions (Signed)

## 2020-08-20 ENCOUNTER — Ambulatory Visit (INDEPENDENT_AMBULATORY_CARE_PROVIDER_SITE_OTHER): Payer: Medicare Other

## 2020-08-20 DIAGNOSIS — I442 Atrioventricular block, complete: Secondary | ICD-10-CM | POA: Diagnosis not present

## 2020-08-20 LAB — CUP PACEART REMOTE DEVICE CHECK
Battery Impedance: 1004 Ohm
Battery Remaining Longevity: 53 mo
Battery Voltage: 2.77 V
Brady Statistic AP VP Percent: 69 %
Brady Statistic AP VS Percent: 1 %
Brady Statistic AS VP Percent: 30 %
Brady Statistic AS VS Percent: 0 %
Date Time Interrogation Session: 20220819090754
Implantable Lead Implant Date: 20150903
Implantable Lead Implant Date: 20150903
Implantable Lead Location: 753859
Implantable Lead Location: 753860
Implantable Lead Model: 5076
Implantable Lead Model: 5076
Implantable Pulse Generator Implant Date: 20150903
Lead Channel Impedance Value: 426 Ohm
Lead Channel Impedance Value: 465 Ohm
Lead Channel Pacing Threshold Amplitude: 0.625 V
Lead Channel Pacing Threshold Amplitude: 0.75 V
Lead Channel Pacing Threshold Pulse Width: 0.4 ms
Lead Channel Pacing Threshold Pulse Width: 0.4 ms
Lead Channel Setting Pacing Amplitude: 2 V
Lead Channel Setting Pacing Amplitude: 2.5 V
Lead Channel Setting Pacing Pulse Width: 0.4 ms
Lead Channel Setting Sensing Sensitivity: 2 mV

## 2020-08-23 ENCOUNTER — Other Ambulatory Visit: Payer: Self-pay

## 2020-08-23 ENCOUNTER — Other Ambulatory Visit: Payer: Medicare Other

## 2020-08-23 DIAGNOSIS — C61 Malignant neoplasm of prostate: Secondary | ICD-10-CM

## 2020-08-24 LAB — PSA: Prostate Specific Ag, Serum: <0.1 ng/mL (ref 0.0–4.0)

## 2020-08-25 NOTE — Progress Notes (Signed)
08/26/20 11:53 AM   Jacob [name] [birth year] [id number]  Referring provider:  [name], [address] [place],  [place] [address] Chief Complaint  Patient presents with   Prostate Cancer     HPI: Jacob [name] is a 87 y.o.male with multiple GU issues who returns today for 6 month follow-up   He has a personal histroy of prostate cancer with a notably rising PSA. His PSA was a 4.5 in 2018 and rose to 10.6 in 04/2018. In addition to this, he had patchy nonfocal uptake on PET on 09/2017.  He underwent prostate biopsy on 06/04/2018 and returns today via telephone visit to discuss biopsy results  Biopsy showed 7 of 12 positive for prostate cancer involving cores on the left up to 100% of Gleason 4+5 and 4+3 as well as a single core of Gleason 3+3 at the right lateral base. T2b, iPSA 10.   08/23/2020 PSA was <0.1  He also has a personal history of left renal pelvic cancer status post nephro ureterectomy in 08/2013 as well as bladder cancer.  His last recurrence was in 2016.  Please see previous note dated 05/2018 for details.  Today he is experiencing new symptoms of urinary urgency and frequency. He states he feels he is emptying well.    PSA Trend:  Component     Latest Ref Rng & Units 05/22/2016 11/14/2017 02/24/2020 08/23/2020  Prostate Specific Ag, Serum     0.0 - 4.0 ng/mL 4.8 (H) 8.1 (H) <0.1 <0.1    PMH: Past Medical History:  Diagnosis Date   Arthritis    hands   At risk for sleep apnea    STOP-BANG=  4     SENT TO PCP 06-19-5013   BPH (benign prostatic hypertrophy)    Coronary artery disease cardiologist-- dr Fletcher Anon  and dr gregg taylor   a. 2006 s/p PCI/Stenting x 2 to the RCA;  b. 05/03/12 Cardiac CT: Patent RCA stent, nonobs dzs;  c. 01/2013 Ex MV: nl EF, no ischemia.   GERD (gastroesophageal reflux disease)    Glaucoma of both eyes    H/O hiatal hernia    History of atrial fibrillation    a. 2006.   History of diverticulitis    History of kidney stones     History of renal cell carcinoma    08-22-2013  s/p  left nephrouretectomy   Hyperlipidemia    Hyperlipidemia    Hypertension    LBBB (left bundle branch block)    Mobitz type 2 second degree AV block    Recurrent bladder transitional cell carcinoma ([place]) monitored by dr [name]   s/p turb's  and chemo bladder instillations   Renal insufficiency    a. Creat rose to 2.34 post-op L nephrectomy.   S/P placement of cardiac pacemaker MEDTRONIC last pacer check 12-01-2013   08-29-2013  for symptomatic bradycardia and mobitz 2 second degree heart block   Symptomatic bradycardia    s/p  pacemaker placement 08-29-2013    Surgical History: Past Surgical History:  Procedure Laterality Date   APPENDECTOMY  02/1988   CATARACT EXTRACTION W/ INTRAOCULAR LENS  IMPLANT, BILATERAL  2011   CORONARY ANGIOPLASTY WITH STENT PLACEMENT  2006    [facility]   X2 STENTS  TO RCA   CYSTOSCOPY W/ RETROGRADES Right 01/13/2014   Procedure: CYSTOSCOPY WITH RETROGRADE PYELOGRAM;  Surgeon: [name], MD;  Location: [facility];  Service: Urology;  Laterality: Right;   CYSTOSCOPY W/ URETERAL STENT  PLACEMENT Left 08/22/2013   Procedure: CYSTOSCOPY WITH RETROGRADE PYELOGRAM/URETERAL STENT PLACEMENT;  Surgeon: [name], MD;  Location[facility];  Service: Urology;  Laterality: Left;   CYSTOSCOPY WITH BIOPSY  04/13/2011     Procedure: CYSTOSCOPY WITH BIOPSY;  Surgeon: [name], MD;  Location: [facility];  Service: Urology;  Laterality: N/A;  Cold Cup Biopsy   CYSTOSCOPY WITH RETROGRADE PYELOGRAM, URETEROSCOPY AND STENT PLACEMENT Left 06/19/2013   Procedure: CYSTOSCOPY WITH LEFT RETROGRADE PYELOGRAM, LEFT URETEROSCOPY, ureteral balloon dilatation, BIOPSY LEFT KIDNEY;  Surgeon: [name], MD;  Location[facility];  Service: Urology;  Laterality: Left;   INGUINAL HERNIA REPAIR Left 08/22/2013   Procedure: LAPAROSCOPIC INGUINAL HERNIA;  Surgeon: [name], MD;   Location: [facility];  Service: Urology;  Laterality: Left;   PERMANENT PACEMAKER INSERTION N/A 08/29/2013   Procedure: PERMANENT PACEMAKER INSERTION;  Surgeon: [name], MD;  Location: [facility];  Service: Cardiovascular;  Laterality: N/A;  Medtronic dual-chamber   ROBOT ASSITED LAPAROSCOPIC NEPHROURETERECTOMY Left 08/22/2013   Procedure: ROBOT ASSITED LAPAROSCOPIC NEPHROURETERECTOMY;  Surgeon: [name], MD;  Location: [facility];  Service: Urology;  Laterality: Left;   TRANSTHORACIC ECHOCARDIOGRAM  08-29-2013   mild LVH/  ef 60-65% /  grade I diastolic dysfunction/  mild MR/  mild LAE  and RAE/  moderate TR   TRANSURETHRAL RESECTION OF BLADDER TUMOR  12-22-2010   TRANSURETHRAL RESECTION OF BLADDER TUMOR  08/04/2011   Procedure: TRANSURETHRAL RESECTION OF BLADDER TUMOR (TURBT);  Surgeon: [name], MD;  Location: [facility];  Service: Urology;  Laterality: N/A;   TRANSURETHRAL RESECTION OF BLADDER TUMOR N/A 01/13/2014   Procedure: TRANSURETHRAL RESECTION OF BLADDER TUMOR (TURBT);  Surgeon: [name], MD;  Location: [facility];  Service: Urology;  Laterality: N/A;    Home Medications:  Allergies as of 08/26/2020       Reactions   Penicillins Hives   BLISTERS        Medication List        Accurate as of August 26, 2020 11:53 AM. If you have any questions, ask your nurse or doctor.          acetaminophen 325 MG tablet Commonly known as: TYLENOL Take 650 mg by mouth every 4 (four) hours as needed.   aspirin 81 MG tablet Take 81 mg by mouth daily.   atorvastatin 40 MG tablet Commonly known as: LIPITOR Take 1 tablet (40 mg total) by mouth daily.   cetirizine 10 MG tablet Commonly known as: ZYRTEC Take 10 mg by mouth daily.   esomeprazole 20 MG capsule Commonly known as: NEXIUM Take 20 mg by mouth daily.   FeroSul 325 (65 FE) MG tablet Generic drug: ferrous sulfate Take 325 mg by mouth daily.   Fish Oil 1200 MG  Caps Take by mouth.   isosorbide mononitrate 30 MG 24 hr tablet Commonly known as: IMDUR Take 30 mg by mouth at bedtime.   latanoprost 0.005 % ophthalmic solution Commonly known as: XALATAN Place 1 drop into both eyes at bedtime.   losartan 50 MG tablet Commonly known as: COZAAR Take 1 tablet by mouth every morning.   multivitamins ther. w/minerals Tabs tablet Take 1 tablet by mouth daily.   ProAir HFA 108 (90 Base) MCG/ACT inhaler Generic drug: albuterol Inhale 1 puff into the lungs every 4 (four) hours as needed.   senna-docusate 8.6-50 MG tablet Commonly known as: Senokot-S Take 1 tablet by mouth 2 (two) times  daily. While taking pain meds to prevent constipation   Symbicort 160-4.5 MCG/ACT inhaler Generic drug: budesonide-formoterol INHALE 2 PUFFS INTO LUNGS TWICE DAILY. RINSE MOUTH AFTER USE.        Allergies:  Allergies  Allergen Reactions   Penicillins Hives    BLISTERS    Family History: Family History  Problem Relation Age of Onset   Heart attack Mother    Heart attack Father    Prostate cancer Neg Hx    Bladder Cancer Neg Hx    Kidney cancer Neg Hx     Social History:  reports that he quit smoking about 19 years ago. His smoking use included cigarettes. He has a 50.00 pack-year smoking history. He has never used smokeless tobacco. He reports that he does not drink alcohol and does not use drugs.   Physical Exam: BP (!) 193/49   Pulse 60   Ht 5\' 6"  (1.676 m)   Wt 190 lb (86.2 kg)   BMI 30.67 kg/m   Constitutional:  Alert and oriented, No acute distress. HEENT: [place] AT, moist mucus membranes.  Trachea midline, no masses. Cardiovascular: No clubbing, cyanosis, or edema. Respiratory: Normal respiratory effort, no increased work of breathing. Skin: No rashes, bruises or suspicious lesions. Neurologic: Grossly intact, no focal deficits, moving all 4 extremities. Psychiatric: Normal mood and affect.  Laboratory Data:  Lab Results  Component  Value Date   CREATININE 2.70 (H) 09/16/2018     Pertinent Imaging: Results for orders placed or performed in visit on 08/26/20  Bladder Scan (Post Void Residual) in office  Result Value Ref Range   Scan Result 75      Assessment & Plan:   Prostate Cancer  - PSA remains undetectable   - Personal history of high risk presumed local prostate cancer (although never underwent metastatic work-up based on shared decision making) on hormone only treatment given his advanced age and comorbidities. He has decided to continue hormones given his high risk of disease. He has tolerated hormon injection well.   - Leuprolide (Eligard) injection again in 6 months .    2. Nocturia - Given sample of Gemtesa for 1 month   - PVR today 75 mL  - Advise him to stop liquid intake at least 4 hours before bedtime.   3. History of bladder cancer  - Persona history of both upper tract urothelial as well as bladder cancer with last recurrence in 2016  - Has not had a recent surveillance cystoscopy and has elected to defer further cystoscopy.    Follow up in 6 months with UA/PSA for eligard   I,Kailey Littlejohn,acting as a scribe for [name], MD.,have documented all relevant documentation on the behalf of [name], MD,as directed by  [name], MD while in the presence of [name], MD.  I have reviewed the above documentation for accuracy and completeness, and I agree with the above.   [name], MD    [facility] [address], [place], [place] [address] [phone]

## 2020-08-26 ENCOUNTER — Other Ambulatory Visit: Payer: Self-pay

## 2020-08-26 ENCOUNTER — Encounter: Payer: Self-pay | Admitting: Urology

## 2020-08-26 ENCOUNTER — Telehealth: Payer: Self-pay

## 2020-08-26 ENCOUNTER — Ambulatory Visit (INDEPENDENT_AMBULATORY_CARE_PROVIDER_SITE_OTHER): Payer: Medicare Other | Admitting: Urology

## 2020-08-26 VITALS — BP 193/49 | HR 60 | Ht 66.0 in | Wt 190.0 lb

## 2020-08-26 DIAGNOSIS — C61 Malignant neoplasm of prostate: Secondary | ICD-10-CM

## 2020-08-26 LAB — BLADDER SCAN AMB NON-IMAGING: Scan Result: 75

## 2020-08-26 MED ORDER — LEUPROLIDE ACETATE (6 MONTH) 45 MG ~~LOC~~ KIT
45.0000 mg | PACK | Freq: Once | SUBCUTANEOUS | Status: AC
  Administered 2020-08-26: 45 mg via SUBCUTANEOUS

## 2020-08-26 MED ORDER — GEMTESA 75 MG PO TABS: 75.0000 mg | ORAL_TABLET | Freq: Every day | ORAL | 0 refills | Status: DC

## 2020-08-26 NOTE — Progress Notes (Signed)
Eligard SubQ Injection   Due to Prostate Cancer patient is present today for a Eligard Injection.  Medication: Eligard 6 month Dose: 45 mg  Location: right subcutaneous hip Lot: [id number] Exp: 03/23/22  Patient tolerated well, no complications were noted  Performed by: Verlene Mayer, [place]  Per Dr. [name] patient is to continue therapy for 6 months . Patient's next follow up was scheduled for 03/25/21. This appointment was scheduled using wheel and given to patient today along with reminder continue on Vitamin D 800-1000iu and Calium 1000-1200mg  daily while on Androgen Deprivation Therapy.

## 2020-08-26 NOTE — Telephone Encounter (Signed)
Called [facility] for prior authorization of [facility]. Auth # [id number] 08/26/20 - 08/26/21. Appt scheduled.

## 2020-09-04 NOTE — Progress Notes (Signed)
Remote pacemaker transmission.   

## 2020-09-22 ENCOUNTER — Telehealth: Payer: Self-pay

## 2020-09-22 NOTE — Telephone Encounter (Signed)
Incoming message from patient on triage VM in regards to [facility]. Patient states he was seen in office 8/25 and given a month of Gemtesa samples and told to call if the [place] worked for him. He thinks it is working well for him and he would like a prescription sent in.

## 2020-09-22 NOTE — Telephone Encounter (Signed)
Great news, okay to prescribe.  Please let us know if this is cost prohibitive.  [name], MD

## 2020-09-23 MED ORDER — GEMTESA 75 MG PO TABS: 75.0000 mg | ORAL_TABLET | Freq: Every day | ORAL | 11 refills | Status: DC

## 2020-09-23 NOTE — Addendum Note (Signed)
Addended by: Verlene Mayer A on: 09/23/2020 10:52 AM   Modules accepted: Orders

## 2020-09-23 NOTE — Telephone Encounter (Signed)
Sent in RX, patient advised.

## 2020-10-14 ENCOUNTER — Telehealth: Payer: Self-pay

## 2020-10-14 NOTE — Telephone Encounter (Signed)
Anticholinergics are contraindicated in this patient due to his age and frailty.  You can supply him with another month of samples and see if we can get a tier exception.  Alternative would be Myrbetriq.  Have to contact his insurance to see what the coverage is on this particular drug.  [name], MD

## 2020-10-14 NOTE — Telephone Encounter (Signed)
Gemtesa not covered by insurance. Is there something else we can prescribe?

## 2020-10-15 NOTE — Telephone Encounter (Signed)
Patient informed, samples left up front. Will start an appeal for patient. Received a cancellation from [employer] for [place].

## 2020-11-19 ENCOUNTER — Ambulatory Visit (INDEPENDENT_AMBULATORY_CARE_PROVIDER_SITE_OTHER): Payer: Medicare Other

## 2020-11-19 DIAGNOSIS — I442 Atrioventricular block, complete: Secondary | ICD-10-CM

## 2020-11-19 LAB — CUP PACEART REMOTE DEVICE CHECK
Battery Impedance: 1083 Ohm
Battery Remaining Longevity: 52 mo
Battery Voltage: 2.77 V
Brady Statistic AP VP Percent: 71 %
Brady Statistic AP VS Percent: 1 %
Brady Statistic AS VP Percent: 28 %
Brady Statistic AS VS Percent: 1 %
Date Time Interrogation Session: 20221118090330
Implantable Lead Implant Date: 20150903
Implantable Lead Implant Date: 20150903
Implantable Lead Location: 753859
Implantable Lead Location: 753860
Implantable Lead Model: 5076
Implantable Lead Model: 5076
Implantable Pulse Generator Implant Date: 20150903
Lead Channel Impedance Value: 444 Ohm
Lead Channel Impedance Value: 497 Ohm
Lead Channel Pacing Threshold Amplitude: 0.75 V
Lead Channel Pacing Threshold Amplitude: 0.75 V
Lead Channel Pacing Threshold Pulse Width: 0.4 ms
Lead Channel Pacing Threshold Pulse Width: 0.4 ms
Lead Channel Setting Pacing Amplitude: 2 V
Lead Channel Setting Pacing Amplitude: 2.5 V
Lead Channel Setting Pacing Pulse Width: 0.4 ms
Lead Channel Setting Sensing Sensitivity: 2 mV

## 2020-11-23 ENCOUNTER — Other Ambulatory Visit: Payer: Self-pay | Admitting: Internal Medicine

## 2020-11-24 NOTE — Telephone Encounter (Signed)
This is a [place] pt 

## 2020-11-29 NOTE — Progress Notes (Signed)
Remote pacemaker transmission.   

## 2021-01-18 ENCOUNTER — Encounter: Payer: Self-pay | Admitting: Emergency Medicine

## 2021-01-18 ENCOUNTER — Emergency Department: Payer: Medicare Other

## 2021-01-18 ENCOUNTER — Other Ambulatory Visit: Payer: Self-pay

## 2021-01-18 ENCOUNTER — Emergency Department
Admission: EM | Admit: 2021-01-18 | Discharge: 2021-01-18 | Disposition: A | Discharge status: Home / Self Care | Payer: Medicare Other | Attending: Emergency Medicine | Admitting: Emergency Medicine

## 2021-01-18 DIAGNOSIS — K5900 Constipation, unspecified: Secondary | ICD-10-CM | POA: Insufficient documentation

## 2021-01-18 DIAGNOSIS — S32020A Wedge compression fracture of second lumbar vertebra, initial encounter for closed fracture: Secondary | ICD-10-CM | POA: Diagnosis not present

## 2021-01-18 DIAGNOSIS — S34109A Unspecified injury to unspecified level of lumbar spinal cord, initial encounter: Secondary | ICD-10-CM | POA: Diagnosis present

## 2021-01-18 DIAGNOSIS — M545 Low back pain, unspecified: Secondary | ICD-10-CM

## 2021-01-18 DIAGNOSIS — X58XXXA Exposure to other specified factors, initial encounter: Secondary | ICD-10-CM | POA: Insufficient documentation

## 2021-01-18 LAB — BASIC METABOLIC PANEL
Anion gap: 10 (ref 5–15)
BUN: 51 mg/dL — ABNORMAL HIGH (ref 8–23)
CO2: 21 mmol/L — ABNORMAL LOW (ref 22–32)
Calcium: 9.4 mg/dL (ref 8.9–10.3)
Chloride: 103 mmol/L (ref 98–111)
Creatinine, Ser: 2.29 mg/dL — ABNORMAL HIGH (ref 0.61–1.24)
GFR, Estimated: 26 mL/min — ABNORMAL LOW (ref >60)
Glucose, Bld: 116 mg/dL — ABNORMAL HIGH (ref 70–99)
Potassium: 5.3 mmol/L — ABNORMAL HIGH (ref 3.5–5.1)
Sodium: 134 mmol/L — ABNORMAL LOW (ref 135–145)

## 2021-01-18 LAB — CBC WITH DIFFERENTIAL/PLATELET
Abs Immature Granulocytes: 0.05 10*3/uL (ref 0.00–0.07)
Basophils Absolute: 0.1 10*3/uL (ref 0.0–0.1)
Basophils Relative: 1 %
Eosinophils Absolute: 0.1 10*3/uL (ref 0.0–0.5)
Eosinophils Relative: 1 %
HCT: 32.9 % — ABNORMAL LOW (ref 39.0–52.0)
Hemoglobin: 10.8 g/dL — ABNORMAL LOW (ref 13.0–17.0)
Immature Granulocytes: 1 %
Lymphocytes Relative: 15 %
Lymphs Abs: 1.5 10*3/uL (ref 0.7–4.0)
MCH: 30.9 pg (ref 26.0–34.0)
MCHC: 32.8 g/dL (ref 30.0–36.0)
MCV: 94.3 fL (ref 80.0–100.0)
Monocytes Absolute: 0.7 10*3/uL (ref 0.1–1.0)
Monocytes Relative: 7 %
Neutro Abs: 7.8 10*3/uL — ABNORMAL HIGH (ref 1.7–7.7)
Neutrophils Relative %: 75 %
Platelets: 241 10*3/uL (ref 150–400)
RBC: 3.49 MIL/uL — ABNORMAL LOW (ref 4.22–5.81)
RDW: 13.2 % (ref 11.5–15.5)
WBC: 10.2 10*3/uL (ref 4.0–10.5)
nRBC: 0 % (ref 0.0–0.2)

## 2021-01-18 MED ORDER — OXYCODONE HCL 5 MG PO TABS
5.0000 mg | ORAL_TABLET | Freq: Once | ORAL | Status: AC
  Administered 2021-01-18: 5 mg via ORAL
  Filled 2021-01-18: qty 1

## 2021-01-18 MED ORDER — TRAMADOL HCL 50 MG PO TABS
50.0000 mg | ORAL_TABLET | Freq: Once | ORAL | Status: AC
  Administered 2021-01-18: 50 mg via ORAL
  Filled 2021-01-18: qty 1

## 2021-01-18 MED ORDER — ACETAMINOPHEN 325 MG PO TABS
650.0000 mg | ORAL_TABLET | Freq: Once | ORAL | Status: AC
  Administered 2021-01-18: 650 mg via ORAL
  Filled 2021-01-18: qty 2

## 2021-01-18 MED ORDER — OXYCODONE HCL 5 MG PO TABS: 5.0000 mg | ORAL_TABLET | Freq: Two times a day (BID) | ORAL | 0 refills | Status: DC | PRN

## 2021-01-18 NOTE — ED Notes (Signed)
First Nurse Note:  Pt to ED via [facility] from home for back pain x 3 days that has gotten worse. Pt had a coughing spell and started having sharp pain in his back. Pt has used OTC medication without relief. Pt also c/o some constipation. Pt is in NAD.

## 2021-01-18 NOTE — Discharge Instructions (Signed)
Please take tylenol for pain. If it does not help, take the prescribed pain medication.  Be aware that the pain medication can cause dizziness and drowsiness. Use your cane when ambulating and stand up slowly. It will also cause constipation. Take MiraLax as directed on the packaging.  Follow up with the specialist as listed.

## 2021-01-18 NOTE — Progress Notes (Signed)
Orthopedic Tech Progress Note Patient Details:  Jacob [name] [birth year] [id number]  Called in order to [employer] for a TLSO BRACE   Patient ID: Jacob [name], male   DOB: 1928/03/15, 88 y.o.   MRN: [id number]  Janit Pagan 01/18/2021, 3:31 PM

## 2021-01-18 NOTE — ED Notes (Signed)
Secretary calling for TLSO brace.

## 2021-01-18 NOTE — ED Triage Notes (Signed)
Pt here via [facility] from home with lower back pain since Thurs. Pt states that he took 2 tylenol with helped on Thurs but the pain came back much worse on Friday. Pt states pain is worse on the left than the right. Pt denies injury. Pt in NAD in triage.

## 2021-01-18 NOTE — ED Provider Notes (Signed)
[facility] Provider Note    Event Date/Time   First MD Initiated Contact with Patient 01/18/21 1113     (approximate)   History   Back Pain   HPI  Jacob [name] is a 88 y.o. male  who presents to the emergency department for treatment and evaluation of back pain.  Patient states that Thursday he coughed pretty hard and had sudden onset of back pain.  States that he took some Tylenol and it got better.  Pain has gradually worsened and has now become constant and steady.  Tylenol no longer working.  He is also complaining of constipation since Thursday.  He is taking Senokot daily without any results.      Physical Exam   Triage Vital Signs: ED Triage Vitals [01/18/21 1036]  Enc Vitals Group     BP (!) 146/62     Pulse Rate 65     Resp 18     Temp 98.2 F (36.8 C)     Temp Source Oral     SpO2 96 %     Weight 190 lb 0.6 oz (86.2 kg)     Height 5\' 6"  (1.676 m)     Head Circumference      Peak Flow      Pain Score 9     Pain Loc      Pain Edu?      Excl. in [place]?     Most recent vital signs: Vitals:   01/18/21 1036 01/18/21 1644  BP: (!) 146/62 (!) 138/54  Pulse: 65 67  Resp: 18 20  Temp: 98.2 F (36.8 C)   SpO2: 96% 95%     General: Awake, no distress.  CV:  Good peripheral perfusion.  Resp:  Normal effort.  Abd:  No distention.  Other:     ED Results / Procedures / Treatments   Labs (all labs ordered are listed, but only abnormal results are displayed) Labs Reviewed  BASIC METABOLIC PANEL - Abnormal; Notable for the following components:      Result Value   Sodium 134 (*)    Potassium 5.3 (*)    CO2 21 (*)    Glucose, Bld 116 (*)    BUN 51 (*)    Creatinine, Ser 2.29 (*)    GFR, Estimated 26 (*)    All other components within normal limits  CBC WITH DIFFERENTIAL/PLATELET - Abnormal; Notable for the following components:   RBC 3.49 (*)    Hemoglobin 10.8 (*)    HCT 32.9 (*)    Neutro Abs 7.8 (*)    All other  components within normal limits     EKG  Not indicated   RADIOLOGY Image of the lumbar spine viewed by me.  Radiology report reviewed also.  Compression fracture of L2 noted.  CT image of the abdomen and lumbar spine again showing L2 compression fracture--15% total height loss. No acute intraabdominal findings.   PROCEDURES:  Critical Care performed: No  Procedures   MEDICATIONS ORDERED IN ED: Medications  traMADol (ULTRAM) tablet 50 mg (50 mg Oral Given 01/18/21 1138)  acetaminophen (TYLENOL) tablet 650 mg (650 mg Oral Given 01/18/21 1406)  oxyCODONE (Oxy IR/ROXICODONE) immediate release tablet 5 mg (5 mg Oral Given 01/18/21 1528)     IMPRESSION / MDM / ASSESSMENT AND PLAN / ED COURSE  I reviewed the triage vital signs and the nursing notes.  Differential diagnosis includes, but is not limited to, SBO, constipation;  DDD, disc herniation, vertebral fracture.  88 year old male presenting to the emergency department for treatment and evaluation of back pain.  See HPI for further details.  X-ray of the lumbar does show an acute appearing compression fracture.  Patient has a pacemaker and MRI cannot be performed at this facility.  Plan will be to get a CT of the lumbar spine as well as abdomen due to constipation.  Patient is aware and agreeable to this plan.  He was given tramadol without any improvement in his pain.  He has not taken any Tylenol for the past 2 days.  Tylenol ordered.  CT of the lumbar spine does show a L2 compression fracture with about 15% body height loss.  No retropulsion of fragments.  Included images of the abdomen showed no acute concerns.  Lab studies are similar to patient's baseline.  GFR is 26.  Patient states that this is normal for him.  Hemoglobin and hematocrit are 10.8 and 32.9 respectively.  Review of chart shows that he has a history of anemia with numbers similar to these.   Patient continues to complain of pain and is asking for something  stronger. Roxicodone ordered. He was advised this will cause dizziness/drowsiness and is to call for help if he needs to stand. Family at bedside.  TLSO ordered and applied. Patient advised to follow up with neurosurgery. We discussed at length that he should try to take the tylenol instead of the oxycodone. He was again advised of the risks--dizziness, fall, drowsiness, constipation. He was also advised to use his cane any time he is up. He is to return to the ER for symptoms that change or worsen if unable to schedule an appointment.        FINAL CLINICAL IMPRESSION(S) / ED DIAGNOSES   Final diagnoses:  Lumbar back pain  Compression fracture of L2 vertebra, initial encounter ([place])     Rx / DC Orders   ED Discharge Orders          Ordered    oxyCODONE (ROXICODONE) 5 MG immediate release tablet  Every 12 hours PRN        01/18/21 1629             Note:  This document was prepared using Dragon voice recognition software and may include unintentional dictation errors.   Victorino Dike, FNP 01/18/21 1743    Duffy Bruce, MD 01/20/21 1943

## 2021-01-18 NOTE — ED Notes (Signed)
See triage note  presents with lower back pain states he had coughing spell and developed pain  pain is worse today

## 2021-01-24 ENCOUNTER — Other Ambulatory Visit: Payer: Self-pay | Admitting: Family Medicine

## 2021-01-25 ENCOUNTER — Inpatient Hospital Stay
Admission: EM | Admit: 2021-01-25 | Discharge: 2021-02-01 | DRG: 516 | Disposition: A | Discharge status: Skilled Nursing Facility | Payer: Medicare Other | Attending: Internal Medicine | Admitting: Internal Medicine

## 2021-01-25 ENCOUNTER — Emergency Department: Payer: Medicare Other

## 2021-01-25 ENCOUNTER — Other Ambulatory Visit: Payer: Self-pay

## 2021-01-25 ENCOUNTER — Other Ambulatory Visit: Payer: Self-pay | Admitting: Family Medicine

## 2021-01-25 ENCOUNTER — Encounter: Payer: Self-pay | Admitting: Internal Medicine

## 2021-01-25 DIAGNOSIS — S32030A Wedge compression fracture of third lumbar vertebra, initial encounter for closed fracture: Secondary | ICD-10-CM

## 2021-01-25 DIAGNOSIS — E119 Type 2 diabetes mellitus without complications: Secondary | ICD-10-CM

## 2021-01-25 DIAGNOSIS — I441 Atrioventricular block, second degree: Secondary | ICD-10-CM | POA: Diagnosis present

## 2021-01-25 DIAGNOSIS — Z20822 Contact with and (suspected) exposure to covid-19: Secondary | ICD-10-CM | POA: Diagnosis present

## 2021-01-25 DIAGNOSIS — H409 Unspecified glaucoma: Secondary | ICD-10-CM | POA: Diagnosis present

## 2021-01-25 DIAGNOSIS — I129 Hypertensive chronic kidney disease with stage 1 through stage 4 chronic kidney disease, or unspecified chronic kidney disease: Secondary | ICD-10-CM | POA: Diagnosis present

## 2021-01-25 DIAGNOSIS — S32020D Wedge compression fracture of second lumbar vertebra, subsequent encounter for fracture with routine healing: Secondary | ICD-10-CM

## 2021-01-25 DIAGNOSIS — S32020S Wedge compression fracture of second lumbar vertebra, sequela: Secondary | ICD-10-CM

## 2021-01-25 DIAGNOSIS — E871 Hypo-osmolality and hyponatremia: Secondary | ICD-10-CM | POA: Diagnosis not present

## 2021-01-25 DIAGNOSIS — I2510 Atherosclerotic heart disease of native coronary artery without angina pectoris: Secondary | ICD-10-CM | POA: Diagnosis present

## 2021-01-25 DIAGNOSIS — K5900 Constipation, unspecified: Secondary | ICD-10-CM | POA: Diagnosis not present

## 2021-01-25 DIAGNOSIS — M545 Low back pain, unspecified: Secondary | ICD-10-CM

## 2021-01-25 DIAGNOSIS — I480 Paroxysmal atrial fibrillation: Secondary | ICD-10-CM | POA: Diagnosis present

## 2021-01-25 DIAGNOSIS — M19041 Primary osteoarthritis, right hand: Secondary | ICD-10-CM | POA: Diagnosis present

## 2021-01-25 DIAGNOSIS — M8008XA Age-related osteoporosis with current pathological fracture, vertebra(e), initial encounter for fracture: Secondary | ICD-10-CM | POA: Diagnosis not present

## 2021-01-25 DIAGNOSIS — I447 Left bundle-branch block, unspecified: Secondary | ICD-10-CM | POA: Diagnosis present

## 2021-01-25 DIAGNOSIS — I10 Essential (primary) hypertension: Secondary | ICD-10-CM | POA: Diagnosis present

## 2021-01-25 DIAGNOSIS — E1122 Type 2 diabetes mellitus with diabetic chronic kidney disease: Secondary | ICD-10-CM | POA: Diagnosis present

## 2021-01-25 DIAGNOSIS — N184 Chronic kidney disease, stage 4 (severe): Secondary | ICD-10-CM

## 2021-01-25 DIAGNOSIS — Z8249 Family history of ischemic heart disease and other diseases of the circulatory system: Secondary | ICD-10-CM

## 2021-01-25 DIAGNOSIS — S32040A Wedge compression fracture of fourth lumbar vertebra, initial encounter for closed fracture: Secondary | ICD-10-CM

## 2021-01-25 DIAGNOSIS — Z87891 Personal history of nicotine dependence: Secondary | ICD-10-CM

## 2021-01-25 DIAGNOSIS — R972 Elevated prostate specific antigen [PSA]: Secondary | ICD-10-CM

## 2021-01-25 DIAGNOSIS — Z7982 Long term (current) use of aspirin: Secondary | ICD-10-CM

## 2021-01-25 DIAGNOSIS — Z85528 Personal history of other malignant neoplasm of kidney: Secondary | ICD-10-CM

## 2021-01-25 DIAGNOSIS — Z7951 Long term (current) use of inhaled steroids: Secondary | ICD-10-CM

## 2021-01-25 DIAGNOSIS — M19042 Primary osteoarthritis, left hand: Secondary | ICD-10-CM | POA: Diagnosis present

## 2021-01-25 DIAGNOSIS — Z8546 Personal history of malignant neoplasm of prostate: Secondary | ICD-10-CM

## 2021-01-25 DIAGNOSIS — Z8551 Personal history of malignant neoplasm of bladder: Secondary | ICD-10-CM

## 2021-01-25 DIAGNOSIS — K219 Gastro-esophageal reflux disease without esophagitis: Secondary | ICD-10-CM | POA: Diagnosis present

## 2021-01-25 DIAGNOSIS — Z79899 Other long term (current) drug therapy: Secondary | ICD-10-CM

## 2021-01-25 DIAGNOSIS — N179 Acute kidney failure, unspecified: Secondary | ICD-10-CM

## 2021-01-25 DIAGNOSIS — M549 Dorsalgia, unspecified: Secondary | ICD-10-CM | POA: Diagnosis not present

## 2021-01-25 DIAGNOSIS — E875 Hyperkalemia: Secondary | ICD-10-CM

## 2021-01-25 DIAGNOSIS — Z955 Presence of coronary angioplasty implant and graft: Secondary | ICD-10-CM

## 2021-01-25 DIAGNOSIS — Z950 Presence of cardiac pacemaker: Secondary | ICD-10-CM

## 2021-01-25 DIAGNOSIS — N400 Enlarged prostate without lower urinary tract symptoms: Secondary | ICD-10-CM | POA: Diagnosis present

## 2021-01-25 DIAGNOSIS — J449 Chronic obstructive pulmonary disease, unspecified: Secondary | ICD-10-CM | POA: Diagnosis present

## 2021-01-25 DIAGNOSIS — Z905 Acquired absence of kidney: Secondary | ICD-10-CM

## 2021-01-25 DIAGNOSIS — Z880 Allergy status to penicillin: Secondary | ICD-10-CM

## 2021-01-25 DIAGNOSIS — E785 Hyperlipidemia, unspecified: Secondary | ICD-10-CM | POA: Diagnosis present

## 2021-01-25 LAB — URINALYSIS, COMPLETE (UACMP) WITH MICROSCOPIC
Bilirubin Urine: NEGATIVE
Glucose, UA: NEGATIVE mg/dL
Ketones, ur: NEGATIVE mg/dL
Leukocytes,Ua: NEGATIVE
Nitrite: NEGATIVE
Specific Gravity, Urine: 1.01 (ref 1.005–1.030)
Squamous Epithelial / HPF: NONE SEEN (ref 0–5)
pH: 5 (ref 5.0–8.0)

## 2021-01-25 LAB — CBC WITH DIFFERENTIAL/PLATELET
Abs Immature Granulocytes: 0.07 10*3/uL (ref 0.00–0.07)
Basophils Absolute: 0 10*3/uL (ref 0.0–0.1)
Basophils Relative: 0 %
Eosinophils Absolute: 0 10*3/uL (ref 0.0–0.5)
Eosinophils Relative: 0 %
HCT: 31 % — ABNORMAL LOW (ref 39.0–52.0)
Hemoglobin: 10.1 g/dL — ABNORMAL LOW (ref 13.0–17.0)
Immature Granulocytes: 1 %
Lymphocytes Relative: 12 %
Lymphs Abs: 1.1 10*3/uL (ref 0.7–4.0)
MCH: 30.9 pg (ref 26.0–34.0)
MCHC: 32.6 g/dL (ref 30.0–36.0)
MCV: 94.8 fL (ref 80.0–100.0)
Monocytes Absolute: 0.8 10*3/uL (ref 0.1–1.0)
Monocytes Relative: 9 %
Neutro Abs: 6.9 10*3/uL (ref 1.7–7.7)
Neutrophils Relative %: 78 %
Platelets: 274 10*3/uL (ref 150–400)
RBC: 3.27 MIL/uL — ABNORMAL LOW (ref 4.22–5.81)
RDW: 13.6 % (ref 11.5–15.5)
WBC: 8.9 10*3/uL (ref 4.0–10.5)
nRBC: 0 % (ref 0.0–0.2)

## 2021-01-25 LAB — COMPREHENSIVE METABOLIC PANEL
ALT: 21 U/L (ref 0–44)
AST: 30 U/L (ref 15–41)
Albumin: 3.7 g/dL (ref 3.5–5.0)
Alkaline Phosphatase: 70 U/L (ref 38–126)
Anion gap: 12 (ref 5–15)
BUN: 69 mg/dL — ABNORMAL HIGH (ref 8–23)
CO2: 22 mmol/L (ref 22–32)
Calcium: 9.5 mg/dL (ref 8.9–10.3)
Chloride: 99 mmol/L (ref 98–111)
Creatinine, Ser: 2.22 mg/dL — ABNORMAL HIGH (ref 0.61–1.24)
GFR, Estimated: 27 mL/min — ABNORMAL LOW (ref >60)
Glucose, Bld: 105 mg/dL — ABNORMAL HIGH (ref 70–99)
Potassium: 5.6 mmol/L — ABNORMAL HIGH (ref 3.5–5.1)
Sodium: 133 mmol/L — ABNORMAL LOW (ref 135–145)
Total Bilirubin: 0.8 mg/dL (ref 0.3–1.2)
Total Protein: 7.4 g/dL (ref 6.5–8.1)

## 2021-01-25 LAB — RESP PANEL BY RT-PCR (FLU A&B, COVID) ARPGX2
Influenza A by PCR: NEGATIVE
Influenza B by PCR: NEGATIVE
SARS Coronavirus 2 by RT PCR: NEGATIVE

## 2021-01-25 MED ORDER — HYDRALAZINE HCL 20 MG/ML IJ SOLN: 10.0000 mg | Freq: Three times a day (TID) | INTRAMUSCULAR | Status: DC | PRN

## 2021-01-25 MED ORDER — LATANOPROST 0.005 % OP SOLN
1.0000 [drp] | Freq: Every day | OPHTHALMIC | Status: DC
  Administered 2021-01-26 – 2021-01-31 (×6): 1 [drp] via OPHTHALMIC
  Filled 2021-01-25: qty 2.5

## 2021-01-25 MED ORDER — ATORVASTATIN CALCIUM 20 MG PO TABS
40.0000 mg | ORAL_TABLET | Freq: Every day | ORAL | Status: DC
  Administered 2021-01-26 – 2021-02-01 (×5): 40 mg via ORAL
  Filled 2021-01-25 (×5): qty 2

## 2021-01-25 MED ORDER — HYDROCODONE-ACETAMINOPHEN 5-325 MG PO TABS
1.0000 | ORAL_TABLET | ORAL | Status: AC
  Administered 2021-01-25: 22:00:00 1 via ORAL
  Filled 2021-01-25: qty 1

## 2021-01-25 MED ORDER — ASPIRIN EC 81 MG PO TBEC
81.0000 mg | DELAYED_RELEASE_TABLET | Freq: Every day | ORAL | Status: DC
  Administered 2021-01-26 – 2021-02-01 (×5): 81 mg via ORAL
  Filled 2021-01-25 (×5): qty 1

## 2021-01-25 MED ORDER — VIBEGRON 75 MG PO TABS: 75.0000 mg | ORAL_TABLET | Freq: Every day | ORAL | Status: DC

## 2021-01-25 MED ORDER — METHYLNALTREXONE BROMIDE 12 MG/0.6ML ~~LOC~~ SOLN
12.0000 mg | Freq: Once | SUBCUTANEOUS | Status: AC
  Administered 2021-01-25: 20:00:00 12 mg via SUBCUTANEOUS
  Filled 2021-01-25: qty 0.6

## 2021-01-25 MED ORDER — FLEET ENEMA 7-19 GM/118ML RE ENEM
1.0000 | ENEMA | Freq: Once | RECTAL | Status: AC
  Administered 2021-01-25: 19:00:00 1 via RECTAL

## 2021-01-25 MED ORDER — FERROUS SULFATE 325 (65 FE) MG PO TABS
325.0000 mg | ORAL_TABLET | Freq: Every day | ORAL | Status: DC
  Administered 2021-01-26 – 2021-01-30 (×4): 325 mg via ORAL
  Filled 2021-01-25 (×5): qty 1

## 2021-01-25 MED ORDER — ACETAMINOPHEN 325 MG PO TABS
650.0000 mg | ORAL_TABLET | ORAL | Status: DC | PRN
  Administered 2021-01-26 – 2021-01-28 (×2): 650 mg via ORAL
  Filled 2021-01-25 (×2): qty 2

## 2021-01-25 MED ORDER — CYCLOBENZAPRINE HCL 10 MG PO TABS
5.0000 mg | ORAL_TABLET | Freq: Once | ORAL | Status: AC
  Administered 2021-01-25: 22:00:00 5 mg via ORAL
  Filled 2021-01-25: qty 1

## 2021-01-25 MED ORDER — ISOSORBIDE MONONITRATE ER 30 MG PO TB24
30.0000 mg | ORAL_TABLET | Freq: Every day | ORAL | Status: DC
  Administered 2021-01-25 – 2021-01-31 (×7): 30 mg via ORAL
  Filled 2021-01-25 (×7): qty 1

## 2021-01-25 MED ORDER — PANTOPRAZOLE SODIUM 40 MG PO TBEC
40.0000 mg | DELAYED_RELEASE_TABLET | Freq: Every day | ORAL | Status: DC
  Administered 2021-01-26 – 2021-02-01 (×5): 40 mg via ORAL
  Filled 2021-01-25 (×5): qty 1

## 2021-01-25 MED ORDER — MORPHINE SULFATE (PF) 4 MG/ML IV SOLN
4.0000 mg | Freq: Once | INTRAVENOUS | Status: AC
  Administered 2021-01-25: 19:00:00 4 mg via INTRAVENOUS
  Filled 2021-01-25: qty 1

## 2021-01-25 MED ORDER — HEPARIN SODIUM (PORCINE) 5000 UNIT/ML IJ SOLN
5000.0000 [IU] | Freq: Three times a day (TID) | INTRAMUSCULAR | Status: DC
  Administered 2021-01-25 – 2021-01-27 (×5): 5000 [IU] via SUBCUTANEOUS
  Filled 2021-01-25 (×5): qty 1

## 2021-01-25 MED ORDER — LACTATED RINGERS IV BOLUS
500.0000 mL | Freq: Once | INTRAVENOUS | Status: AC
Start: 2021-01-25 — End: 2021-01-25
  Administered 2021-01-25: 21:00:00 500 mL via INTRAVENOUS

## 2021-01-25 MED ORDER — LIDOCAINE 5 % EX PTCH
1.0000 | MEDICATED_PATCH | CUTANEOUS | Status: DC
  Administered 2021-01-25 – 2021-01-31 (×6): 1 via TRANSDERMAL
  Filled 2021-01-25 (×6): qty 1

## 2021-01-25 MED ORDER — FLUTICASONE FUROATE-VILANTEROL 200-25 MCG/ACT IN AEPB
1.0000 | INHALATION_SPRAY | Freq: Every day | RESPIRATORY_TRACT | Status: DC
  Administered 2021-01-26 – 2021-02-01 (×6): 1 via RESPIRATORY_TRACT
  Filled 2021-01-25: qty 28

## 2021-01-25 MED ORDER — ALBUTEROL SULFATE HFA 108 (90 BASE) MCG/ACT IN AERS: 1.0000 | INHALATION_SPRAY | RESPIRATORY_TRACT | Status: DC | PRN

## 2021-01-25 MED ORDER — OXYCODONE HCL 5 MG PO TABS
5.0000 mg | ORAL_TABLET | Freq: Two times a day (BID) | ORAL | Status: DC | PRN
  Administered 2021-01-26 (×2): 5 mg via ORAL
  Filled 2021-01-25 (×3): qty 1

## 2021-01-25 MED ORDER — LOSARTAN POTASSIUM 50 MG PO TABS
50.0000 mg | ORAL_TABLET | Freq: Every morning | ORAL | Status: DC
  Administered 2021-01-26: 09:00:00 50 mg via ORAL
  Filled 2021-01-25: qty 1

## 2021-01-25 NOTE — ED Provider Notes (Signed)
[facility] Provider Note    Event Date/Time   First MD Initiated Contact with Patient 01/25/21 1540     (approximate)   History   Back Pain and Constipation   HPI  Jacob [name] is a 88 y.o. male with a past medical history of GERD, diverticulitis, HTN, HDL, known left bundle branch block, renal cancer and prostate cancer currently undergoing intermittent injections every couple months as well as recent lumbar L2 compression fracture diagnosed on CT on 1/17 that apparently occurred after heavy coughing fit who presents for assessment of some persistent pain in his low back as well as complaining of constipation and some fullness in his lower abdomen.  Patient states it has been nearly 2 weeks since he has had a bowel movement close to a week.  He is not sure when his abdominal pain began but thinks it has been 2 or 3 days.  He has been taking over-the-counter stool softener as well as MiraLAX and tried a suppository last night but does not feel it helped much.  He has not had any vomiting, chest pain, cough, shortness of breath, headache, earache, sore throat or any new weakness numbness or tingling in his legs.  He is currently wearing a brace for his low back and notes he got a steroid injection at the orthopedist yesterday.  He is currently taking hydrocodone for his pain.       Past Medical History:  Diagnosis Date   Arthritis    hands   At risk for sleep apnea    STOP-BANG=  4     SENT TO PCP [id number]   BPH (benign prostatic hypertrophy)    Coronary artery disease cardiologist-- dr Fletcher Anon  and dr gregg taylor   a. 2006 s/p PCI/Stenting x 2 to the RCA;  b. 05/03/12 Cardiac CT: Patent RCA stent, nonobs dzs;  c. 01/2013 Ex MV: nl EF, no ischemia.   GERD (gastroesophageal reflux disease)    Glaucoma of both eyes    H/O hiatal hernia    History of atrial fibrillation    a. 2006.   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 Creat rose to 2.34 post-op L nephrectomy.   S/P placement of cardiac pacemaker [employer] last pacer check 12-01-2013   08-29-2013  for symptomatic bradycardia and mobitz 2 second degree heart block   Symptomatic bradycardia    s/p  pacemaker placement 08-29-2013     Physical Exam  Triage Vital Signs: ED Triage Vitals  Enc Vitals Group     BP 01/25/21 1249 (!) 123/58     Pulse Rate 01/25/21 1249 74     Resp 01/25/21 1249 18     Temp 01/25/21 1249 98.1 F (36.7 C)     Temp Source 01/25/21 1249 Oral     SpO2 01/25/21 1249 96 %     Weight 01/25/21 1245 190 lb 0.6 oz (86.2 kg)     Height 01/25/21 1245 5\' 6"  (1.676 m)     Head Circumference --  Peak Flow --      Pain Score 01/25/21 1245 8     Pain Loc --      Pain Edu? --      Excl. in [place]? --     Most recent vital signs: Vitals:   01/25/21 1939 01/25/21 2100  BP: (!) 122/51 (!) 164/59  Pulse: 92 85  Resp: 17 18  Temp:    SpO2: 95% 94%    General: Awake, no distress.  CV:  Good peripheral perfusion.  2+ DP pulses. Resp:  Normal effort.  Abd:  No distention.  Some mild tenderness over the L2 lumbar spine area and some mild fullness and slight tenderness in the suprapubic area.  Patient has symmetric strength in his lower extremities without any new deficits.  Sensation is intact light touch throughout.  No tenderness over the C or T-spine.   ED Results / Procedures / Treatments  Labs (all labs ordered are listed, but only abnormal results are displayed) Labs Reviewed  CBC WITH DIFFERENTIAL/PLATELET - Abnormal; Notable for the following components:      Result Value    RBC 3.27 (*)    Hemoglobin 10.1 (*)    HCT 31.0 (*)    All other components within normal limits  COMPREHENSIVE METABOLIC PANEL - Abnormal; Notable for the following components:   Sodium 133 (*)    Potassium 5.6 (*)    Glucose, Bld 105 (*)    BUN 69 (*)    Creatinine, Ser 2.22 (*)    GFR, Estimated 27 (*)    All other components within normal limits  URINALYSIS, COMPLETE (UACMP) WITH MICROSCOPIC - Abnormal; Notable for the following components:   APPearance CLEAR (*)    Hgb urine dipstick MODERATE (*)    Protein, ur TRACE (*)    Bacteria, UA RARE (*)    All other components within normal limits  RESP PANEL BY RT-PCR (FLU A&B, COVID) [id number]     EKG    RADIOLOGY  CT abdomen pelvis shows slightly increased collapse of L2 known compression fracture without any new fractures.  There is no evidence of diverticulitis, SBO, kidney stone or other clear acute abdominal pelvic process.   PROCEDURES:  Critical Care performed: No  Procedures    MEDICATIONS ORDERED IN ED: Medications  lidocaine (LIDODERM) 5 % 1 patch (1 patch Transdermal Patch Applied 01/25/21 1613)  methylnaltrexone (RELISTOR) injection 12 mg (12 mg Subcutaneous Given 01/25/21 1956)  morphine 4 MG/ML injection 4 mg (4 mg Intravenous Given 01/25/21 1841)  sodium phosphate (FLEET) 7-19 GM/118ML enema 1 enema (1 enema Rectal Given 01/25/21 1842)  lactated ringers bolus 500 mL (0 mLs Intravenous Stopped 01/25/21 2118)  HYDROcodone-acetaminophen (NORCO/VICODIN) 5-325 MG per tablet 1 tablet (1 tablet Oral Given 01/25/21 2151)  cyclobenzaprine (FLEXERIL) tablet 5 mg (5 mg Oral Given 01/25/21 2151)     IMPRESSION / MDM / ASSESSMENT AND PLAN / ED COURSE  I reviewed the triage vital signs and the nursing notes.                              Differential diagnosis includes, but is not limited to related to opioid use, SBO, metabolic derangements and likely persistent pain from known L2 compression fracture.  CT abdomen  pelvis shows slightly increased collapse of L2 known compression fracture without any new fractures.  There is no evidence of diverticulitis, SBO, kidney stone or other clear acute abdominal pelvic process.  CBC without leukocytosis and stable anemia with a hemoglobin of 10.1 compared to 10.87 days ago.  CMP remarkable for stable AKI with creatinine of 2.22 compared to 2.297 days ago.  Potassium is slightly elevated at 5.6.  UA has some rare bacteria but otherwise not suggestive of cystitis.  Patient given dose of Relistor and Fleet enema without resulting in a bowel movement.  No impaction seen on CT I could not manually disimpact at this time.  Patient and son at bedside report the patient lives alone and takes a fair amount of time to move between his recliner and the bathroom using a walker especially wearing his brace.  I am concerned that patient requires bowel cleanout but this cannot be safely done at home at this time.  I will admit to medicine service for further evaluation and management.      FINAL CLINICAL IMPRESSION(S) / ED DIAGNOSES   Final diagnoses:  Compression fracture of L2 vertebra with routine healing, subsequent encounter  Constipation, unspecified constipation type     Rx / DC Orders   ED Discharge Orders     None        Note:  This document was prepared using Dragon voice recognition software and may include unintentional dictation errors.   Lucrezia Starch, MD 01/25/21 2207

## 2021-01-25 NOTE — ED Notes (Signed)
MD Posey Pronto) @ the bedside.

## 2021-01-25 NOTE — ED Provider Triage Note (Signed)
Emergency Medicine Provider Triage Evaluation Note  Jacob [name], a 88 y.o. male  was evaluated in triage.  Pt complains of constipation x 2 weeks.  Patient was seen in the ED last week and treated for an acute L2 compression fracture.  He was prescribed narcotics at that time, had a refill of the narcotics at his PCPs office but he admits to taking the narcotics daily as directed.  He reports some low abdominal cramping as well as some flatulence.  Review of Systems  Positive: constipation Negative: NV  Physical Exam  BP (!) 123/58 (BP Location: Left Arm)    Pulse 74    Temp 98.1 F (36.7 C) (Oral)    Resp 18    Ht 5\' 6"  (1.676 m)    Wt 86.2 kg    SpO2 96%    BMI 30.67 kg/m  Gen:   Awake, no distress   Resp:  Normal effort  MSK:   Moves extremities without difficulty  Other:  ABD: soft, no rebound, guarding  Medical Decision Making  Medically screening exam initiated at 12:57 PM.  Appropriate orders placed.  Jacob [name] was informed that the remainder of the evaluation will be completed by another provider, this initial triage assessment does not replace that evaluation, and the importance of remaining in the ED until their evaluation is complete.  Geriatric patient ED evaluation of constipation and low back pain x2 weeks.  Patient is 1 week status post L2 compression fracture with noted narcotic use.   Melvenia Needles, PA-C 01/25/21 1258

## 2021-01-25 NOTE — ED Notes (Signed)
Pt provided a urinal.

## 2021-01-25 NOTE — ED Notes (Signed)
Pt found and roomed to RM 49

## 2021-01-25 NOTE — H&P (Signed)
History and Physical    Jacob [name] CHE:[id number] DOB: [birth year] DOA: 01/25/2021  PCP: [name], MD    Patient coming from:  Home    Chief Complaint:  Back pain   HPI:  Jacob [name] is a 88 y.o. male seen in ed with complaints of Back pain.  Pt has been constipated and needs bowel prep. Pt lives alone and needs to have bowel enema and cant make it to commode because of his compression fracture that happened recently and has no help at home. Requested by edmd to admit observation and and manage pt's constipation. Pt has been diagnosed with recent lumbar L2 compression fracture diagnosed on CT on 1/17 that apparently occurred after heavy coughing .  Pt has past medical history of GERD, diverticulitis, HTN, HDL, known left bundle branch block, renal cancer and prostate cancer. ED Course:   Vitals:   01/25/21 1620 01/25/21 1939 01/25/21 2100 01/25/21 2200  BP: (!) 148/51 (!) 122/51 (!) 164/59 (!) 144/66  Pulse: 76 92 85 85  Resp: 18 17 18 19   Temp:      TempSrc:      SpO2: 95% 95% 94% 96%  Weight:      Height:      Emergency room patient is alert awake oriented afebrile and is oxygenating 95% on room air.  CMP today shows sodium 133 potassium 5.6 glucose 105 creatinine 2.22 which is at baseline.  Normal LFTs, CBC shows white count of 8.9 hemoglobin of 10.1 which is stable platelet count of 274.  Urinalysis shows moderate hematuria on dipstick otherwise clear. CT scan of the abdomen shows slight further collapse of L2 compression fractures since a week ago and increased amount of fecal matter throughout the colon consistent with constipation.  Patient last echocardiogram was in June 2022 showed a EF of 50 to 55%, left ventricle has low normal function with no regional wall abnormalities and has grade 2 diastolic dysfunction, left atrium is mildly dilated, right ventricular systolic function is normal as well as the size.  In the emergency room patient received fleets  enema Norco for pain Flexeril for pain, patient was also given Relistor in the emergency room.   Review of Systems:  Review of Systems  Gastrointestinal:  Positive for constipation.  Musculoskeletal:  Positive for back pain.  All other systems reviewed and are negative.  Past Medical History:  Diagnosis Date   Arthritis    hands   At risk for sleep apnea    STOP-BANG=  4     SENT TO PCP [id number]   BPH (benign prostatic hypertrophy)    Coronary artery disease cardiologist-- dr Fletcher Anon  and dr gregg taylor   a. 2006 s/p PCI/Stenting x 2 to the RCA;  b. 05/03/12 Cardiac CT: Patent RCA stent, nonobs dzs;  c. 01/2013 Ex MV: nl EF, no ischemia.   GERD (gastroesophageal reflux disease)    Glaucoma of both eyes    H/O hiatal hernia    History of atrial fibrillation    a. 2006.   History of diverticulitis    History of kidney stones    History of renal cell carcinoma    08-22-2013  s/p  left nephrouretectomy   Hyperlipidemia    Hyperlipidemia    Hypertension    LBBB (left bundle branch block)    Mobitz type 2 second degree AV block    Recurrent bladder transitional cell carcinoma ([place]) monitored by dr [name]   s/p turb's  and  chemo bladder instillations   Renal insufficiency    a. Creat rose to 2.34 post-op L nephrectomy.   S/P placement of cardiac pacemaker MEDTRONIC last pacer check 12-01-2013   08-29-2013  for symptomatic bradycardia and mobitz 2 second degree heart block   Symptomatic bradycardia    s/p  pacemaker placement 08-29-2013    Past Surgical History:  Procedure Laterality Date   APPENDECTOMY  02/1988   CATARACT EXTRACTION W/ INTRAOCULAR LENS  IMPLANT, BILATERAL  2011   CORONARY ANGIOPLASTY WITH STENT PLACEMENT  2006    [facility]   X2 STENTS  TO RCA   CYSTOSCOPY W/ RETROGRADES Right 01/13/2014   Procedure: CYSTOSCOPY WITH RETROGRADE PYELOGRAM;  Surgeon: [name], MD;  Location: [facility];  Service: Urology;  Laterality: Right;   CYSTOSCOPY W/  URETERAL STENT PLACEMENT Left 08/22/2013   Procedure: CYSTOSCOPY WITH RETROGRADE PYELOGRAM/URETERAL STENT PLACEMENT;  Surgeon: [name], MD;  Location: [facility];  Service: Urology;  Laterality: Left;   CYSTOSCOPY WITH BIOPSY  04/13/2011     Procedure: CYSTOSCOPY WITH BIOPSY;  Surgeon: [name], MD;  Location: [facility];  Service: Urology;  Laterality: N/A;  Cold Cup Biopsy   CYSTOSCOPY WITH RETROGRADE PYELOGRAM, URETEROSCOPY AND STENT PLACEMENT Left 06/19/2013   Procedure: CYSTOSCOPY WITH LEFT RETROGRADE PYELOGRAM, LEFT URETEROSCOPY, ureteral balloon dilatation, BIOPSY LEFT KIDNEY;  Surgeon: [name], MD;  Location: [facility];  Service: Urology;  Laterality: Left;   INGUINAL HERNIA REPAIR Left 08/22/2013   Procedure: LAPAROSCOPIC INGUINAL HERNIA;  Surgeon: [name], MD;  Location: [facility];  Service: Urology;  Laterality: Left;   PERMANENT PACEMAKER INSERTION N/A 08/29/2013   Procedure: PERMANENT PACEMAKER INSERTION;  Surgeon: [name], MD;  Location: [facility];  Service: Cardiovascular;  Laterality: N/A;  Medtronic dual-chamber   ROBOT ASSITED LAPAROSCOPIC NEPHROURETERECTOMY Left 08/22/2013   Procedure: ROBOT ASSITED LAPAROSCOPIC NEPHROURETERECTOMY;  Surgeon: [name], MD;  Location: [facility];  Service: Urology;  Laterality: Left;   TRANSTHORACIC ECHOCARDIOGRAM  08-29-2013   mild LVH/  ef 60-65% /  grade I diastolic dysfunction/  mild MR/  mild LAE  and RAE/  moderate TR   TRANSURETHRAL RESECTION OF BLADDER TUMOR  12-22-2010   TRANSURETHRAL RESECTION OF BLADDER TUMOR  08/04/2011   Procedure: TRANSURETHRAL RESECTION OF BLADDER TUMOR (TURBT);  Surgeon: [name], MD;  Location: [facility];  Service: Urology;  Laterality: N/A;   TRANSURETHRAL RESECTION OF BLADDER TUMOR N/A 01/13/2014   Procedure: TRANSURETHRAL RESECTION OF BLADDER TUMOR (TURBT);  Surgeon: [name], MD;  Location: [facility];  Service:  Urology;  Laterality: N/A;     reports that he quit smoking about 20 years ago. His smoking use included cigarettes. He has a 50.00 pack-year smoking history. He has never used smokeless tobacco. He reports that he does not drink alcohol and does not use drugs.  Allergies  Allergen Reactions   Penicillins Hives    BLISTERS    Family History  Problem Relation Age of Onset   Heart attack Mother    Heart attack Father    Prostate cancer Neg Hx    Bladder Cancer Neg Hx    Kidney cancer Neg Hx     Prior to Admission medications   Medication Sig Start Date End Date Taking? Authorizing Provider  acetaminophen (TYLENOL) 325 MG tablet Take 650 mg by mouth every 4 (four) hours as needed.    [provider]  aspirin 81 MG tablet Take  81 mg by mouth daily.    [provider]  atorvastatin (LIPITOR) 40 MG tablet TAKE 1 TABLET BY MOUTH ONCE DAILY. STOP LOVASTATIN. 11/24/20   Deboraha Sprang, MD  cetirizine (ZYRTEC) 10 MG tablet Take 10 mg by mouth daily.    [provider]  esomeprazole (NEXIUM) 20 MG capsule Take 20 mg by mouth daily. 05/24/20   [provider]  FEROSUL 325 (65 Fe) MG tablet Take 325 mg by mouth daily. 05/24/20   [provider]  isosorbide mononitrate (IMDUR) 30 MG 24 hr tablet Take 30 mg by mouth at bedtime.    [provider]  latanoprost (XALATAN) 0.005 % ophthalmic solution Place 1 drop into both eyes at bedtime.    [provider]  losartan (COZAAR) 50 MG tablet Take 1 tablet by mouth every morning.  11/05/13   [provider]  Multiple Vitamins-Minerals (MULTIVITAMINS THER. W/MINERALS) TABS Take 1 tablet by mouth daily.    [provider]  Omega-3 Fatty Acids (FISH OIL) 1200 MG CAPS Take by mouth.    [provider]  oxyCODONE (ROXICODONE) 5 MG immediate release tablet Take 1 tablet (5 mg total) by mouth every 12 (twelve) hours as needed. 01/18/21 01/18/22  Triplett, Dessa Phi, FNP  PROAIR  HFA 108 (90 Base) MCG/ACT inhaler Inhale 1 puff into the lungs every 4 (four) hours as needed.  06/14/16   [provider]  senna-docusate (SENOKOT-S) 8.6-50 MG per tablet Take 1 tablet by mouth 2 (two) times daily. While taking pain meds to prevent constipation 08/26/13   [name], MD  SYMBICORT 160-4.5 MCG/ACT inhaler INHALE 2 PUFFS INTO LUNGS TWICE DAILY. RINSE MOUTH AFTER USE. 01/03/18   Laverle Hobby, MD  Vibegron (GEMTESA) 75 MG TABS Take 75 mg by mouth daily. 09/23/20   [name], MD    Physical Exam: Vitals:   01/25/21 1620 01/25/21 1939 01/25/21 2100 01/25/21 2200  BP: (!) 148/51 (!) 122/51 (!) 164/59 (!) 144/66  Pulse: 76 92 85 85  Resp: 18 17 18 19   Temp:      TempSrc:      SpO2: 95% 95% 94% 96%  Weight:      Height:       Physical Exam Vitals reviewed.  Constitutional:      General: He is not in acute distress.    Appearance: Normal appearance. He is not ill-appearing.  HENT:     Head: Normocephalic and atraumatic.     Right Ear: External ear normal.     Left Ear: External ear normal.     Nose: Nose normal.     Mouth/Throat:     Mouth: Mucous membranes are moist.  Eyes:     Extraocular Movements: Extraocular movements intact.     Pupils: Pupils are equal, round, and reactive to light.  Neck:     Vascular: No carotid bruit.  Cardiovascular:     Rate and Rhythm: Normal rate and regular rhythm.     Pulses: Normal pulses.     Heart sounds: Normal heart sounds.  Pulmonary:     Effort: Pulmonary effort is normal.     Breath sounds: Normal breath sounds.  Abdominal:     General: Bowel sounds are normal. There is no distension.     Palpations: Abdomen is soft. There is no mass.     Tenderness: There is no abdominal tenderness. There is no guarding.     Hernia: No hernia is present.  Musculoskeletal:  Right lower leg: No edema.     Left lower leg: No edema.  Skin:    General: Skin is warm.  Neurological:     General: No focal deficit  present.     Mental Status: He is alert and oriented to person, place, and time.  Psychiatric:        Mood and Affect: Mood normal.        Behavior: Behavior normal.     Labs on Admission: I have personally reviewed following labs and imaging studies No results for input(s): CKTOTAL, CKMB, TROPONINI in the last 72 hours. Lab Results  Component Value Date   WBC 8.9 01/25/2021   HGB 10.1 (L) 01/25/2021   HCT 31.0 (L) 01/25/2021   MCV 94.8 01/25/2021   PLT 274 01/25/2021    Recent Labs  Lab 01/25/21 1614  NA 133*  K 5.6*  CL 99  CO2 22  BUN 69*  CREATININE 2.22*  CALCIUM 9.5  PROT 7.4  BILITOT 0.8  ALKPHOS 70  ALT 21  AST 30  GLUCOSE 105*   No results found for: CHOL, HDL, LDLCALC, TRIG No results found for: DDIMER Invalid input(s): POCBNP   COVID-19 Labs No results for input(s): DDIMER, FERRITIN, LDH, CRP in the last 72 hours. Lab Results  Component Value Date   Fellows NEGATIVE 01/25/2021    Radiological Exams on Admission: CT ABDOMEN PELVIS WO CONTRAST  Result Date: 01/25/2021 CLINICAL DATA:  Abdominal pain, acute, nonlocalized. Constipation over the last 2 weeks. Recent L2 compression fracture. EXAM: CT ABDOMEN AND PELVIS WITHOUT CONTRAST TECHNIQUE: Multidetector CT imaging of the abdomen and pelvis was performed following the standard protocol without IV contrast. RADIATION DOSE REDUCTION: This exam was performed according to the departmental dose-optimization program which includes automated exposure control, adjustment of the mA and/or kV according to patient size and/or use of iterative reconstruction technique. COMPARISON:  01/18/2021 FINDINGS: Lower chest: Chronic scarring at the left lung base. Previous pacemaker placement. Hepatobiliary: Redemonstration of an exophytic liver bump along the anterior margin, unchanged from previous studies. Liver parenchyma is otherwise normal without contrast. No calcified gallstones. Pancreas: Normal Spleen: Small but  normal spleen. Adrenals/Urinary Tract: Adrenal glands are normal. The left kidney is surgically absent. 1 mm upper pole renal stone on the right as seen previously. No hydronephrosis. Stomach/Bowel: Stomach and small intestine are unremarkable. Patient does have an increased amount of stool now present throughout the colon consistent with a degree of constipation. There is diverticulosis of the colon without evidence of diverticulitis. Vascular/Lymphatic: Aortic atherosclerosis. Maximal diameter of the infrarenal abdominal aorta is 2.9 cm as seen previously. No retroperitoneal adenopathy. Reproductive: Mildly enlarged prostate. Other: No free fluid or air. Musculoskeletal: Small inguinal hernias containing only fat. Anterior abdominal wall hernia as seen previously containing fat. Mild intrusion of small intestine but without evidence of incarceration or obstruction. Recent L2 compression fracture. Slight further progression of collapse since the prior study. No retropulsed bone. IMPRESSION: Slight further collapse of the L2 compression fracture compared to the study of 1 week ago. Vertebral augmentation could be considered. Increased amount of fecal matter throughout the colon when compared to the prior study, consistent with a degree of constipation. No other acute or significant finding. Electronically Signed   By: Nelson Chimes M.D.   On: 01/25/2021 16:50   DG Abdomen 1 View  Result Date: 01/25/2021 CLINICAL DATA:  Constipation EXAM: ABDOMEN - 1 VIEW COMPARISON:  None. FINDINGS: Normal bowel gas pattern. Moderate amount of stool in  the colon. Small amount of stool in the rectum. No acute skeletal abnormality. Dual lead pacemaker. Electronic device overlying the epigastric region. IMPRESSION: Moderate retained stool in the colon.  Normal bowel gas pattern. Electronically Signed   By: Franchot Gallo M.D.   On: 01/25/2021 13:43    EKG: Independently reviewed.  None    Assessment/Plan: Principal Problem:    Constipation Active Problems:   CAD (coronary artery disease)   HTN (hypertension)   COPD (chronic obstructive pulmonary disease) (HCC)   CKD (chronic kidney disease) stage 3, GFR 30-59 ml/min (HCC)   Type 2 diabetes mellitus without complication, without long-term current use of insulin (HCC)   Back pain   Constipation: Patient has received enema in the ED along with a Relistor injection for his constipation. If he has no response tomorrow we will administer a smog enema.  CAD: Stable patient denies any chest pain or shortness of breath or any other complaints or any anginal equivalent complaints. We will continue patient on his aspirin 81, atorvastatin 40, Imdur, losartan.  Hypertension: Blood pressure (!) 144/66, pulse 85, temperature 97.9 F (36.6 C), temperature source Oral, resp. rate 19, height 5\' 6"  (1.676 m), weight 86.2 kg, SpO2 96 %. Continue patient on Imdur, losartan, as needed hydralazine.  COPD: Stable currently O2 sats are 96% on room air. Continue as needed MDI and nebulizer treatment as needed.  Diabetes mellitus type 2: Will start patient on sliding scale insulin and glycemic regimen.  Back pain: Attribute patient's back pain to combination of lumbar compression fracture and constipation. As needed Tylenol and home regimen of oxycodone that the patient is taking. Discussed with patient about discussing with primary care or his neurosurgeon about evaluation of his presumed osteoporosis causing patient's compression fracture.  DVT prophylaxis:  Heparin  Code Status:  Full code  Family Communication:  Danial, Hlavac (Son)  [phone] (Mobile)   Disposition Plan:  Home  Consults called:  None  Admission status: Observation.   Medical Decision Making   Coding    Para Skeans MD [facility]  6 PM- 2 AM. Please contact me via secure Chat 6 PM-2 AM. To contact the [facility] Attending or Consulting provider [place] or covering provider  during after hours [place], for this patient.   Check the care team in [facility] and look for a) attending/consulting [facility] provider listed and b) the [facility] team listed Log into [email or web address] and use [place]'s universal password to access. If you do not have the password, please contact the hospital operator. Locate the [facility] provider you are looking for under [facility] and page to a number that you can be directly reached. If you still have difficulty reaching the provider, please page the [facility] (Director on Call) for the Hospitalists listed on amion for assistance. [email or web address] 01/26/2021, 12:20 AM

## 2021-01-25 NOTE — ED Triage Notes (Signed)
Pt here with lower back pain and constipation x2 weeks. Pt was seen here recently for same, has vertebra issues. Pt sees a specialist for back pain. Pt in NAD in triage.

## 2021-01-25 NOTE — ED Notes (Signed)
Pt not found to be in room since roomed to RM 49

## 2021-01-26 DIAGNOSIS — S32020S Wedge compression fracture of second lumbar vertebra, sequela: Secondary | ICD-10-CM | POA: Diagnosis not present

## 2021-01-26 DIAGNOSIS — S32040A Wedge compression fracture of fourth lumbar vertebra, initial encounter for closed fracture: Secondary | ICD-10-CM

## 2021-01-26 DIAGNOSIS — E875 Hyperkalemia: Secondary | ICD-10-CM

## 2021-01-26 DIAGNOSIS — S32030A Wedge compression fracture of third lumbar vertebra, initial encounter for closed fracture: Secondary | ICD-10-CM

## 2021-01-26 DIAGNOSIS — I129 Hypertensive chronic kidney disease with stage 1 through stage 4 chronic kidney disease, or unspecified chronic kidney disease: Secondary | ICD-10-CM

## 2021-01-26 DIAGNOSIS — N184 Chronic kidney disease, stage 4 (severe): Secondary | ICD-10-CM

## 2021-01-26 DIAGNOSIS — K5900 Constipation, unspecified: Secondary | ICD-10-CM | POA: Diagnosis not present

## 2021-01-26 HISTORY — DX: Wedge compression fracture of fourth lumbar vertebra, initial encounter for closed fracture: S32.040A

## 2021-01-26 LAB — BASIC METABOLIC PANEL
Anion gap: 9 (ref 5–15)
BUN: 63 mg/dL — ABNORMAL HIGH (ref 8–23)
CO2: 24 mmol/L (ref 22–32)
Calcium: 9.1 mg/dL (ref 8.9–10.3)
Chloride: 101 mmol/L (ref 98–111)
Creatinine, Ser: 2.08 mg/dL — ABNORMAL HIGH (ref 0.61–1.24)
GFR, Estimated: 30 mL/min — ABNORMAL LOW (ref >60)
Glucose, Bld: 119 mg/dL — ABNORMAL HIGH (ref 70–99)
Potassium: 4.9 mmol/L (ref 3.5–5.1)
Sodium: 134 mmol/L — ABNORMAL LOW (ref 135–145)

## 2021-01-26 MED ORDER — ADULT MULTIVITAMIN W/MINERALS CH
1.0000 | ORAL_TABLET | Freq: Every day | ORAL | Status: DC
  Administered 2021-01-26 – 2021-02-01 (×5): 1 via ORAL
  Filled 2021-01-26 (×5): qty 1

## 2021-01-26 MED ORDER — SODIUM POLYSTYRENE SULFONATE 15 GM/60ML PO SUSP
30.0000 g | Freq: Once | ORAL | Status: AC
  Administered 2021-01-26: 11:00:00 30 g via ORAL
  Filled 2021-01-26: qty 120

## 2021-01-26 MED ORDER — AMLODIPINE BESYLATE 5 MG PO TABS
5.0000 mg | ORAL_TABLET | Freq: Every day | ORAL | Status: DC
  Administered 2021-01-28 – 2021-02-01 (×4): 5 mg via ORAL
  Filled 2021-01-26 (×4): qty 1

## 2021-01-26 MED ORDER — OXYCODONE HCL 5 MG PO TABS
5.0000 mg | ORAL_TABLET | ORAL | Status: DC | PRN
  Administered 2021-01-26 – 2021-02-01 (×13): 5 mg via ORAL
  Filled 2021-01-26 (×14): qty 1

## 2021-01-26 MED ORDER — ENSURE ENLIVE PO LIQD
237.0000 mL | Freq: Two times a day (BID) | ORAL | Status: DC
  Administered 2021-01-26 – 2021-01-27 (×2): 237 mL via ORAL

## 2021-01-26 MED ORDER — CYCLOBENZAPRINE HCL 10 MG PO TABS
5.0000 mg | ORAL_TABLET | Freq: Three times a day (TID) | ORAL | Status: DC | PRN
  Administered 2021-01-26 – 2021-02-01 (×7): 5 mg via ORAL
  Filled 2021-01-26 (×7): qty 1

## 2021-01-26 MED ORDER — ALBUTEROL SULFATE (2.5 MG/3ML) 0.083% IN NEBU: 2.5000 mg | INHALATION_SOLUTION | RESPIRATORY_TRACT | Status: DC | PRN

## 2021-01-26 MED ORDER — SENNOSIDES-DOCUSATE SODIUM 8.6-50 MG PO TABS
2.0000 | ORAL_TABLET | Freq: Two times a day (BID) | ORAL | Status: DC
  Administered 2021-01-26 – 2021-02-01 (×11): 2 via ORAL
  Filled 2021-01-26 (×11): qty 2

## 2021-01-26 MED ORDER — MIRABEGRON ER 25 MG PO TB24
25.0000 mg | ORAL_TABLET | Freq: Every day | ORAL | Status: DC
  Administered 2021-01-26 – 2021-02-01 (×5): 25 mg via ORAL
  Filled 2021-01-26 (×8): qty 1

## 2021-01-26 NOTE — NC FL2 (Signed)
[place] LEVEL OF CARE SCREENING TOOL     IDENTIFICATION  Patient Name: Jacob [name] Birthdate: December 22, 1928 Sex: male Admission Date (Current Location): 01/25/2021  [facility] and [place] Number:  Engineering geologist and Address:  [facility], [address], [place], [place] [address]      Provider Number: [id number]  Attending Physician Name and Address:  Sharen Hones, MD  Relative Name and Phone Number:  Husam [phone]    Current Level of Care: Hospital Recommended Level of Care: [place] Prior Approval Number:    Date Approved/Denied:   PASRR Number: [id number]  Discharge Plan: SNF    Current Diagnoses: Patient Active Problem List   Diagnosis Date Noted   CKD stage 4 secondary to hypertension ([place]) 01/26/2021   Closed compression fracture of L2 lumbar vertebra, sequela 01/26/2021   Hyperkalemia 01/26/2021   Constipation 01/25/2021   Back pain 01/25/2021   Type 2 diabetes mellitus without complication, without long-term current use of insulin ([place]) 08/29/2017   History of bladder cancer 05/09/2016   Nodule of left lung 05/07/2016   COPD (chronic obstructive pulmonary disease) ([place]) 03/28/2015   Bladder carcinoma ([place]) 03/28/2015   Acute and chronic respiratory failure with hypoxia (HCC) 03/28/2015   GERD (gastroesophageal reflux disease) 03/28/2015   Pacemaker 12/01/2013   Mobitz type 2 second degree AV block 12/01/2013   Acquired solitary kidney 10/27/2013   CKD (chronic kidney disease) stage 3, GFR 30-59 ml/min (HCC) 10/27/2013   Personal history of renal cell cancer 10/27/2013   Bradycardia 08/28/2013   Symptomatic bradycardia 08/28/2013   Sinus bradycardia    Renal insufficiency    HTN (hypertension)    Renal cancer, left (HCC)    Urothelial carcinoma of kidney ([place]) 08/22/2013   Left bundle branch block 02/14/2013   CAD (coronary artery disease)    Hyperlipidemia     Orientation  RESPIRATION BLADDER Height & Weight     Self, Time, Situation, Place  Normal Continent Weight: 86.2 kg Height:  5\' 6"  (167.6 cm)  BEHAVIORAL SYMPTOMS/MOOD NEUROLOGICAL BOWEL NUTRITION STATUS      Continent Diet (regular)  AMBULATORY STATUS COMMUNICATION OF NEEDS Skin   Extensive Assist Verbally Normal                       Personal Care Assistance Level of Assistance  Bathing, Feeding, Dressing Bathing Assistance: Limited assistance Feeding assistance: Independent Dressing Assistance: Maximum assistance     Functional Limitations Info  Hearing   Hearing Info: Impaired      SPECIAL CARE FACTORS FREQUENCY  PT (By licensed PT), OT (By licensed OT)     PT Frequency: 5 times per week OT Frequency: 5 times per week            Contractures Contractures Info: Not present    Additional Factors Info  Code Status, Allergies Code Status Info: full code Allergies Info: Penicillin           Current Medications (01/26/2021):  This is the current hospital active medication list Current Facility-Administered Medications  Medication Dose Route Frequency Provider Last Rate Last Admin   acetaminophen (TYLENOL) tablet 650 mg  650 mg Oral Q4H PRN Para Skeans, MD   650 mg at 01/26/21 0905   albuterol (PROVENTIL) (2.5 MG/3ML) 0.083% nebulizer solution 2.5 mg  2.5 mg Nebulization Q4H PRN Renda Rolls, RPH       [START ON 01/27/2021] amLODipine (NORVASC) tablet 5 mg  5 mg Oral Daily Sharen Hones, MD       aspirin EC tablet 81 mg  81 mg Oral Daily Florina Ou V, MD   81 mg at 01/26/21 0904   atorvastatin (LIPITOR) tablet 40 mg  40 mg Oral Daily Para Skeans, MD   40 mg at 01/26/21 0905   feeding supplement (ENSURE ENLIVE / ENSURE PLUS) liquid 237 mL  237 mL Oral BID BM Sharen Hones, MD   237 mL at 01/26/21 1414   ferrous sulfate tablet 325 mg  325 mg Oral Daily Para Skeans, MD   325 mg at 01/26/21 0905   fluticasone furoate-vilanterol (BREO ELLIPTA) 200-25 MCG/ACT 1 puff  1  puff Inhalation Daily Para Skeans, MD   1 puff at 01/26/21 0907   heparin injection 5,000 Units  5,000 Units Subcutaneous Q8H Florina Ou V, MD   5,000 Units at 01/26/21 1414   hydrALAZINE (APRESOLINE) injection 10 mg  10 mg Intravenous Q8H PRN Para Skeans, MD       isosorbide mononitrate (IMDUR) 24 hr tablet 30 mg  30 mg Oral QHS Florina Ou V, MD   30 mg at 01/25/21 2309   latanoprost (XALATAN) 0.005 % ophthalmic solution 1 drop  1 drop Both Eyes QHS Florina Ou V, MD       lidocaine (LIDODERM) 5 % 1 patch  1 patch Transdermal Q24H Lucrezia Starch, MD   1 patch at 01/25/21 1613   multivitamin with minerals tablet 1 tablet  1 tablet Oral Daily Sharen Hones, MD   1 tablet at 01/26/21 1409   oxyCODONE (Oxy IR/ROXICODONE) immediate release tablet 5 mg  5 mg Oral Q4H PRN Sharen Hones, MD       pantoprazole (PROTONIX) EC tablet 40 mg  40 mg Oral Daily Para Skeans, MD   40 mg at 01/26/21 1007   senna-docusate (Senokot-S) tablet 2 tablet  2 tablet Oral BID Sharen Hones, MD   2 tablet at 01/26/21 1409   Vibegron TABS 75 mg  75 mg Oral Daily Para Skeans, MD         Discharge Medications: Please see discharge summary for a list of discharge medications.  Relevant Imaging Results:  Relevant Lab Results:   Additional Information SS3 [id number]  Conception Oms, RN

## 2021-01-26 NOTE — Progress Notes (Signed)
Initial Nutrition Assessment  DOCUMENTATION CODES:  Obesity unspecified  INTERVENTION:  Recommend liberalizing diet to regular.  Add Ensure Plus High Protein po BID, each supplement provides 350 kcal and 20 grams of protein.   Add MVI with minerals daily.  Encourage PO and supplement intake.  NUTRITION DIAGNOSIS:  Inadequate oral intake related to acute illness (L2 compression fracture and limited mobility) as evidenced by per patient/family report.  GOAL:  Patient will meet greater than or equal to 90% of their needs  MONITOR:  PO intake, Supplement acceptance, Diet advancement, Labs, Weight trends, I & O's  REASON FOR ASSESSMENT:  Malnutrition Screening Tool    ASSESSMENT:  88 yo male with a PMH of GERD, diverticulitis, HTN, HDL, renal cancer, and prostate cancer who  presented to ED 1/17 with back pain. Pt was found to have L2 compression fracture and was ordered to wear TLSO and follow up with outpatient neurosurgery. Pt was discharged, but then returned to ED 1/24 with lower back pain and constipation x2 weeks.  Spoke with pt at bedside. Pt reports significant pain and struggled to move and asked RD to defer physical exam.  Discussed constipation relief via intake as well as stool softeners and OTC laxatives for patient to try.  Pt very appreciative.   Pt's weight appears to be stable over the past 5 months.  RD to bring "Constipation Nutrition Therapy" handout from the Academy of [employer] to patient later this afternoon for patient's reference.  Of note, pt with mild BLE edema.  Recommend liberalizing diet to regular given age. RD to communicate this recommendation to MD via secure-chat.  Medications: reviewed; ferrous sulfate, Protonix, Senokot BID, oxycodone PO PRN (given twice today)  Labs: reviewed; Na 133 (L), K 5.6 (H), Glucose 105 (H), BUN 69 (H - trending up), Crt 2.22 (H - trending down)  NUTRITION - FOCUSED PHYSICAL EXAM: Asked to defer  due to pain - defer to follow-up  Diet Order:   Diet Order             Diet heart healthy/carb modified Room service appropriate? Yes; Fluid consistency: Thin  Diet effective now                  EDUCATION NEEDS:  Education needs have been addressed  Skin:  Skin Assessment: Reviewed RN Assessment  Last BM:  01/26/21  Height:  Ht Readings from Last 1 Encounters:  01/25/21 5\' 6"  (1.676 m)   Weight:  Wt Readings from Last 1 Encounters:  01/25/21 86.2 kg   BMI:  Body mass index is 30.67 kg/m.  Estimated Nutritional Needs:  Kcal:  1800-2000 Protein:  85-100 grams Fluid:  >1.8 L  Derrel Nip, RD, LDN (she/her/hers) Clinical Inpatient Dietitian RD Pager/After-Hours/Weekend Pager # in [place]

## 2021-01-26 NOTE — Progress Notes (Signed)
PROGRESS NOTE    Jacob [name]  CHY:[id number] DOB: [birth year] DOA: 01/25/2021 PCP: [name], MD   Chief complaint.  Intractable low back pain. Brief Narrative:  Jacob [name] is a 88 year old male with Jacob of paroxysmal atrial [name], Jacob [name], Jacob [name], Jacob [name], Jacob kidney [name] stage IV who presented to the hospital with 2 weeks of intractable back pain. CT scan showed L2 compression fracture.   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 L2 compression lumbar fracture. Patient still has severe low back pain, which is caused by compression fracture. I ordered PT/OT, patient mobility is very limited due to severe pain. Will discuss with orthopedics for possible kyphoplasty to reduce the pain. Continue pain medicine and a Lidoderm patch.  Hyperkalemia secondary to Jacob kidney [name] stage IV. Jacob kidney [name] stage IV. Hyponatremia. Patient will be given Kayexalate.  Recheck a BMP tomorrow. Reviewed previous lab, patient renal function still stable.  Constipation. Start senna.  Type 2 diabetes.   Glucose not significant elevated.  Continue to follow.  Jacob [name]. Discontinue losartan due to significant hyperkalemia, start on Norvasc.    DVT prophylaxis: Heparin Code Status: full Family Communication: Son updated. Disposition Plan:    Status is: Observation     I/O last 3 completed shifts: In: -  Out:  150 [Urine:150] Total I/O In: 240 [P.O.:240] Out: 300 [Urine:300]     Consultants:    Procedures:   Antimicrobials: None  Subjective: Patient still has severe lower back pain, worse with any movement.  Very difficult to ambulate due to pain. Constipated, had a bowel movement today. No nausea vomiting abdominal pain. Not short of breath or cough. No dysuria hematuria  No fever or chills.  Objective: Vitals:   01/26/21 0027 01/26/21 0517 01/26/21 0720 01/26/21 1118  BP: (!) 132/58 (!) 137/49 (!) 142/49 (!) 116/45  Pulse: 78 73 73 (!) 57  Resp: 18 16 16 16   Temp: 98.5 F (36.9 C) 98.2 F (36.8 C) 98.9 F (37.2 C) 98.3 F (36.8 C)  TempSrc:      SpO2: 94% 94% 92% 95%  Weight:      Height:        Intake/Output Summary (Last 24 hours) at 01/26/2021 1217 Last data filed at 01/26/2021 1028 Gross per 24 hour  Intake 240 ml  Output 450 ml  Net -210 ml   Filed Weights   01/25/21 1245 01/25/21 1618  Weight: 86.2 kg 86.2 kg    Examination:  General exam: Appears calm and comfortable  Respiratory system: Clear to auscultation. Respiratory effort normal. Cardiovascular system: S1 & S2 heard, RRR. No JVD, murmurs, rubs, gallops or clicks. No pedal edema. Gastrointestinal system: Abdomen is nondistended, soft and nontender. No organomegaly or masses felt. Normal bowel sounds heard. Central nervous system: Alert and oriented. No focal neurological deficits. Extremities: Symmetric 5 x 5 power. Skin: No rashes, lesions or ulcers Psychiatry: Judgement and insight appear normal. Mood & affect appropriate.     Data Reviewed: I have personally reviewed following  labs and imaging studies  CBC: Recent Labs  Lab 01/25/21 1614  WBC 8.9  NEUTROABS 6.9  HGB 10.1*  HCT 31.0*  MCV 94.8  PLT 625   Basic Metabolic Panel: Recent Labs  Lab 01/25/21 1614  NA 133*  K 5.6*  CL 99  CO2 22  GLUCOSE 105*  BUN 69*  CREATININE 2.22*  CALCIUM 9.5   GFR: Estimated  Creatinine Clearance: 22.8 mL/min (A) (by C-G formula based on SCr of 2.22 mg/dL (H)). Liver Function Tests: Recent Labs  Lab 01/25/21 1614  AST 30  ALT 21  ALKPHOS 70  BILITOT 0.8  PROT 7.4  ALBUMIN 3.7   No results for input(s): LIPASE, AMYLASE in the last 168 hours. No results for input(s): AMMONIA in the last 168 hours. Coagulation Profile: No results for input(s): INR, PROTIME in the last 168 hours. Cardiac Enzymes: No results for input(s): CKTOTAL, CKMB, CKMBINDEX, TROPONINI in the last 168 hours. BNP (last 3 results) No results for input(s): PROBNP in the last 8760 hours. HbA1C: No results for input(s): HGBA1C in the last 72 hours. CBG: No results for input(s): GLUCAP in the last 168 hours. Lipid Profile: No results for input(s): CHOL, HDL, LDLCALC, TRIG, CHOLHDL, LDLDIRECT in the last 72 hours. Thyroid Function Tests: No results for input(s): TSH, T4TOTAL, FREET4, T3FREE, THYROIDAB in the last 72 hours. Anemia Panel: No results for input(s): VITAMINB12, FOLATE, FERRITIN, TIBC, IRON, RETICCTPCT in the last 72 hours. Sepsis Labs: No results for input(s): PROCALCITON, LATICACIDVEN in the last 168 hours.  Recent Results (from the past 240 hour(s))  Resp Panel by RT-PCR (Flu A&B, Covid) Nasopharyngeal Swab     Status: None   Collection Time: 01/25/21  9:53 PM   Specimen: Nasopharyngeal Swab; Nasopharyngeal(NP) swabs in vial transport medium  Result Value Ref Range Status   SARS Coronavirus 2 by RT PCR NEGATIVE NEGATIVE Final    Comment: (NOTE) SARS-CoV-2 target nucleic acids are NOT DETECTED.  The SARS-CoV-2 RNA is generally detectable in upper respiratory specimens during the acute phase of infection. The lowest concentration of SARS-CoV-2 viral copies this assay can detect is 138 copies/mL. A negative result does not preclude SARS-Cov-2 infection and should not be used as the sole basis for treatment or other patient management decisions. A negative result may  occur with  improper specimen collection/handling, submission of specimen other than nasopharyngeal swab, presence of viral mutation(s) within the areas targeted by this assay, and inadequate number of viral copies(<138 copies/mL). A negative result must be combined with clinical observations, patient Jacob, and epidemiological information. The expected result is Negative.  Fact Sheet for Patients:  EntrepreneurPulse.com.au  Fact Sheet for Healthcare Providers:  IncredibleEmployment.be  This test is no t yet approved or cleared by the [place] FDA and  has been authorized for detection and/or diagnosis of SARS-CoV-2 by FDA under an Emergency Use Authorization (EUA). This EUA will remain  in effect (meaning this test can be used) for the duration of the COVID-19 declaration under Section 564(b)(1) of the Act, 21 U.S.C.section 360bbb-3(b)(1), unless the authorization is terminated  or revoked sooner.       Influenza A by PCR NEGATIVE NEGATIVE Final   Influenza B by PCR NEGATIVE NEGATIVE Final    Comment: (NOTE) The Xpert Xpress SARS-CoV-2/FLU/RSV plus assay is intended as an aid in the diagnosis of influenza from Nasopharyngeal swab specimens and should not be used as a sole basis for treatment. Nasal washings and aspirates are unacceptable for Xpert Xpress SARS-CoV-2/FLU/RSV  testing.  Fact Sheet for Patients: EntrepreneurPulse.com.au  Fact Sheet for Healthcare Providers: IncredibleEmployment.be  This test is not yet approved or cleared by the [place] FDA and has been authorized for detection and/or diagnosis of SARS-CoV-2 by FDA under an Emergency Use Authorization (EUA). This EUA will remain in effect (meaning this test can be used) for the duration of the COVID-19 declaration under Section 564(b)(1) of the Act, 21 U.S.C. section 360bbb-3(b)(1), unless the authorization is terminated  or revoked.  Performed at [facility], [address], [place], [place] [address]          Radiology Studies: CT ABDOMEN PELVIS WO CONTRAST  Result Date: 01/25/2021 CLINICAL DATA:  Abdominal pain, acute, nonlocalized. Constipation over the last 2 weeks. Recent L2 compression fracture. EXAM: CT ABDOMEN AND PELVIS WITHOUT CONTRAST TECHNIQUE: Multidetector CT imaging of the abdomen and pelvis was performed following the standard protocol without IV contrast. RADIATION DOSE REDUCTION: This exam was performed according to the departmental dose-optimization program which includes automated exposure control, adjustment of the mA and/or kV according to patient size and/or use of iterative reconstruction technique. COMPARISON:  01/18/2021 FINDINGS: Lower chest: Jacob scarring at the left lung base. Previous pacemaker placement. Hepatobiliary: Redemonstration of an exophytic liver bump along the anterior margin, unchanged from previous studies. Liver parenchyma is otherwise normal without contrast. No calcified gallstones. Pancreas: Normal Spleen: Small but normal spleen. Adrenals/Urinary Tract: Adrenal glands are normal. The left kidney is surgically absent. 1 mm upper pole renal stone on the right as seen previously. No hydronephrosis. Stomach/Bowel: Stomach and small intestine are unremarkable. Patient does have an increased amount of stool now present throughout the colon consistent with a degree of constipation. There is diverticulosis of the colon without evidence of diverticulitis. Vascular/Lymphatic: Aortic atherosclerosis. Maximal diameter of the infrarenal abdominal aorta is 2.9 cm as seen previously. No retroperitoneal adenopathy. Reproductive: Mildly enlarged prostate. Other: No free fluid or air. Musculoskeletal: Small inguinal hernias containing only fat. Anterior abdominal wall hernia as seen previously containing fat. Mild intrusion of small intestine but without evidence of  incarceration or obstruction. Recent L2 compression fracture. Slight further progression of collapse since the prior study. No retropulsed bone. IMPRESSION: Slight further collapse of the L2 compression fracture compared to the study of 1 week ago. Vertebral augmentation could be considered. Increased amount of fecal matter throughout the colon when compared to the prior study, consistent with a degree of constipation. No other acute or significant finding. Electronically Signed   By: Nelson Chimes M.D.   On: 01/25/2021 16:50   DG Abdomen 1 View  Result Date: 01/25/2021 CLINICAL DATA:  Constipation EXAM: ABDOMEN - 1 VIEW COMPARISON:  None. FINDINGS: Normal bowel gas pattern. Moderate amount of stool in the colon. Small amount of stool in the rectum. No acute skeletal abnormality. Dual lead pacemaker. Electronic device overlying the epigastric region. IMPRESSION: Moderate retained stool in the colon.  Normal bowel gas pattern. Electronically Signed   By: Franchot Gallo M.D.   On: 01/25/2021 13:43        Scheduled Meds:  aspirin EC  81 mg Oral Daily   atorvastatin  40 mg Oral Daily   ferrous sulfate  325 mg Oral Daily   fluticasone furoate-vilanterol  1 puff Inhalation Daily   heparin  5,000 Units Subcutaneous Q8H   isosorbide mononitrate  30 mg Oral QHS   latanoprost  1 drop Both Eyes QHS   lidocaine  1 patch Transdermal Q24H   losartan  50  mg Oral q morning   pantoprazole  40 mg Oral Daily   Vibegron  75 mg Oral Daily   Continuous Infusions:   LOS: 0 days    Time spent:35 minutes    Sharen Hones, MD [facility]   To contact the attending provider between 7A-7P or the covering provider during after hours 7P-7A, please log into the web site [email or web address] and access using universal [place] password for that web site. If you do not have the password, please call the hospital operator.  01/26/2021, 12:17 PM

## 2021-01-26 NOTE — TOC Initial Note (Signed)
Transition of Care [facility]) - Initial/Assessment Note    Patient Details  Name: Jacob [name] MRN: [id number] Date of Birth: [birth year]  [facility]) CM/SW Contact:    Conception Oms, RN Phone Number: 01/26/2021, 2:11 PM  Clinical Narrative:     The patient lives alone, He is  mod-independent with quad cane  at home, patient has significant back pain now and is impaired with mobility, is agreeable to go to STR SNF, Bedsearch sent, PASSR obtained, FL2 completed, will review offers once obtained         Patient Goals and CMS Choice        Expected Discharge Plan and Services                                                Prior Living Arrangements/Services                       Activities of Daily Living Home Assistive Devices/Equipment: Cane (specify quad or straight) ADL Screening (condition at time of admission) Patient's cognitive ability adequate to safely complete daily activities?: Yes Is the patient deaf or have difficulty hearing?: No Does the patient have difficulty seeing, even when wearing glasses/contacts?: No Does the patient have difficulty concentrating, remembering, or making decisions?: No Patient able to express need for assistance with ADLs?: Yes Does the patient have difficulty dressing or bathing?: Yes (last few days) Independently performs ADLs?: Yes (appropriate for developmental age) Does the patient have difficulty walking or climbing stairs?: Yes Weakness of Legs: None Weakness of Arms/Hands: None  Permission Sought/Granted                  Emotional Assessment              Admission diagnosis:  Back pain [M54.9] Elevated PSA [R97.20] Constipation, unspecified constipation type [K59.00] Compression fracture of L2 vertebra with routine healing, subsequent encounter [S32.020D] Patient Active Problem List   Diagnosis Date Noted   CKD stage 4 secondary to hypertension ([place]) 01/26/2021   Closed  compression fracture of L2 lumbar vertebra, sequela 01/26/2021   Hyperkalemia 01/26/2021   Constipation 01/25/2021   Back pain 01/25/2021   Type 2 diabetes mellitus without complication, without long-term current use of insulin ([place]) 08/29/2017   History of bladder cancer 05/09/2016   Nodule of left lung 05/07/2016   COPD (chronic obstructive pulmonary disease) ([place]) 03/28/2015   Bladder carcinoma ([place]) 03/28/2015   Acute and chronic respiratory failure with hypoxia ([place]) 03/28/2015   GERD (gastroesophageal reflux disease) 03/28/2015   Pacemaker 12/01/2013   Mobitz type 2 second degree AV block 12/01/2013   Acquired solitary kidney 10/27/2013   CKD (chronic kidney disease) stage 3, GFR 30-59 ml/min ([place]) 10/27/2013   Personal history of renal cell cancer 10/27/2013   Bradycardia 08/28/2013   Symptomatic bradycardia 08/28/2013   Sinus bradycardia    Renal insufficiency    HTN (hypertension)    Renal cancer, left ([place])    Urothelial carcinoma of kidney ([place]) 08/22/2013   Left bundle branch block 02/14/2013   CAD (coronary artery disease)    Hyperlipidemia    PCP:  [name], MD Pharmacy:   [facility] [address]), [place] [address] [place]) [place] [address] Phone: [phone] Fax: [phone]  Social Determinants of Health (SDOH) Interventions    Readmission Risk Interventions No flowsheet data found.

## 2021-01-26 NOTE — Evaluation (Signed)
Occupational Therapy Evaluation Patient Details Name: Jacob [name] MRN: [id number] DOB: Jan 11, 1928 Today's Date: 01/26/2021   History of Present Illness 18 M who presented to ED 1/17 with back pain. Pt was found to have L2 compression fracture and was ordered to wear TLSO and follow up with outpatient neurosurgery. Pt was discharged, but then returned to ED 1/24 with lower back pain and constipation x2 weeks. Past medical history significant for GERD, diverticulitis, HTN, HDL, renal cancer, and prostate cancer.   Clinical Impression   Pt seen for OT evaluation this date. At baseline, pt is mod-independent with quad cane for functional mobility and ADLs, living in a 1-story home alone. Pt reports that since ED visit 2 weeks ago, pt has not been able to get out of bed (sleeping in recliner), transfer to toilet, perform LB dressing, or transfer to shower. Pt currently presents with 8/10 back pain and decreased balance. Due to these functional impairments, pt requires MOD A for bed mobility, MAX A for seated LB dressing, MIN A for BSC transfers, and MIN GUARD for functional mobility of short household distances with RW. Pt would benefit from additional skilled OT services to maximize return to PLOF and minimize risk of future falls, injury, and readmission. Upon discharge, recommend SNF.       Recommendations for follow up therapy are one component of a multi-disciplinary discharge planning process, led by the attending physician.  Recommendations may be updated based on patient status, additional functional criteria and insurance authorization.   Follow Up Recommendations  Skilled nursing-short term rehab (<3 hours/day)    Assistance Recommended at Discharge Intermittent Supervision/Assistance  Patient can return home with the following A lot of help with walking and/or transfers;A lot of help with bathing/dressing/bathroom    Functional Status Assessment  Patient has had a recent decline in  their functional status and demonstrates the ability to make significant improvements in function in a reasonable and predictable amount of time.  Equipment Recommendations  Other (comment) (defer to next venue of care, but most likely will require BSC and tub bench)       Precautions / Restrictions Precautions Precautions: Fall Required Braces or Orthoses: Spinal Brace Spinal Brace: Thoracolumbosacral orthotic Restrictions Weight Bearing Restrictions: No Other Position/Activity Restrictions: Pt provided with TLSO following dx of L2 compression fx; MD contacted (via secure) to confirm wearing schedule      Mobility Bed Mobility Overal bed mobility: Needs Assistance Bed Mobility: Rolling, Sidelying to Sit Rolling: Min assist Sidelying to sit: Mod assist       General bed mobility comments: MOD A for supine>sit transfer via log roll    Transfers Overall transfer level: Needs assistance Equipment used: Quad cane, Rolling walker (2 wheels) Transfers: Sit to/from Stand Sit to Stand: Mod assist, Min assist           General transfer comment: Requires MOD A for steadying when upright with use of quad cane. Progressed to MIN A with use of RW in standing position. Requires verbal cues for safe hand placement with RW use      Balance Overall balance assessment: Needs assistance Sitting-balance support: No upper extremity supported, Feet supported Sitting balance-Leahy Scale: Fair Sitting balance - Comments: Requires SUPERVISION for static sitting and EOB   Standing balance support: Bilateral upper extremity supported, During functional activity Standing balance-Leahy Scale: Fair Standing balance comment: MIN GUARD for functional mobility of short household distances with RW  ADL either performed or assessed with clinical judgement   ADL Overall ADL's : Needs assistance/impaired                     Lower Body Dressing: Maximal  assistance;Sitting/lateral leans Lower Body Dressing Details (indicate cue type and reason): to don socks Toilet Transfer: Minimal assistance;Ambulation;BSC/3in1           Functional mobility during ADLs: Min guard;Rolling walker (2 wheels)       Vision Baseline Vision/History: 1 Wears glasses Ability to See in Adequate Light: 0 Adequate Patient Visual Report: No change from baseline              Pertinent Vitals/Pain Pain Assessment Pain Assessment: 0-10 Pain Score: 8  Pain Location: lower back Pain Descriptors / Indicators: Aching, Sharp, Shooting Pain Intervention(s): Limited activity within patient's tolerance, Monitored during session, Repositioned, Patient requesting pain meds-RN notified        Extremity/Trunk Assessment Upper Extremity Assessment Upper Extremity Assessment: Generalized weakness   Lower Extremity Assessment Lower Extremity Assessment: Generalized weakness       Communication Communication Communication: No difficulties   Cognition Arousal/Alertness: Awake/alert Behavior During Therapy: WFL for tasks assessed/performed Overall Cognitive Status: Within Functional Limits for tasks assessed                                                  Home Living Family/patient expects to be discharged to:: Private residence Living Arrangements: Alone   Type of Home: House Home Access: Stairs to enter Technical brewer of Steps: 4   Home Layout: One level     Bathroom Shower/Tub: Tub/shower unit         Home Equipment: Grab bars - tub/shower;Cane - quad          Prior Functioning/Environment Prior Level of Function : Independent/Modified Independent             Mobility Comments: At baseline, pt is independent with quad cane for functional mobility. Reports no falls since 2021. ADLs Comments: At baseline, pt is independent with ADLs/IADLs. Reports that over past 2 weeks, has no been able to perform bed,  tub/shower, and toilet transfers.        OT Problem List: Decreased strength;Decreased activity tolerance;Impaired balance (sitting and/or standing);Decreased knowledge of use of DME or AE;Pain      OT Treatment/Interventions: Self-care/ADL training;Therapeutic exercise;DME and/or AE instruction;Therapeutic activities;Patient/family education;Balance training    OT Goals(Current goals can be found in the care plan section) Acute Rehab OT Goals Patient Stated Goal: to regain independence OT Goal Formulation: With patient Time For Goal Achievement: 02/09/21 Potential to Achieve Goals: Good  OT Frequency: Min 2X/week       AM-PAC OT "6 Clicks" Daily Activity     Outcome Measure Help from another person eating meals?: None Help from another person taking care of personal grooming?: A Little Help from another person toileting, which includes using toliet, bedpan, or urinal?: A Lot Help from another person bathing (including washing, rinsing, drying)?: A Lot Help from another person to put on and taking off regular upper body clothing?: A Little Help from another person to put on and taking off regular lower body clothing?: A Lot 6 Click Score: 16   End of Session Equipment Utilized During Treatment: Rolling walker (2 wheels);Back brace Nurse Communication: Mobility status;Patient requests pain  meds  Activity Tolerance: Patient tolerated treatment well Patient left: in chair;with call bell/phone within reach;with chair alarm set  OT Visit Diagnosis: Unsteadiness on feet (R26.81);Muscle weakness (generalized) (M62.81);Pain Pain - part of body:  (back)                Time: 9292-4462 OT Time Calculation (min): 32 min Charges:  OT General Charges $OT Visit: 1 Visit OT Evaluation $OT Eval Moderate Complexity: 1 Mod OT Treatments $Self Care/Home Management : 23-37 mins  Fredirick Maudlin, OTR/L [place]

## 2021-01-26 NOTE — TOC Progression Note (Signed)
Transition of Care [facility]) - Progression Note    Patient Details  Name: Jacob [name] MRN: [id number] Date of Birth: 1928-03-28  Transition [facility] Care [facility]) CM/SW [place], RN Phone Number: 01/26/2021, 3:49 PM  Clinical Narrative:    Reviewed the bed offers with the patient, he chose [facility] in [place] due to having Family in [place], Accepted in the Hub, I called Ricky at [facility] and accepted the bed        Expected Discharge Plan and Services                                                 Social Determinants of Health (SDOH) Interventions    Readmission Risk Interventions No flowsheet data found.

## 2021-01-26 NOTE — Evaluation (Signed)
Physical Therapy Evaluation Patient Details Name: Jacob [name] MRN: [id number] DOB: May 29, 1928 Today's Date: 01/26/2021  History of Present Illness  40 M who presented to ED 1/17 with back pain. Pt was found to have L2 compression fracture and was ordered to wear TLSO and follow up with outpatient neurosurgery. Pt was discharged, but then returned to ED 1/24 with lower back pain and constipation x2 weeks. Past medical history significant for GERD, diverticulitis, HTN, HDL, renal cancer, and prostate cancer.   Clinical Impression  Patient received on [facility] with NT present in room. He is agreeable to PT assessment. Patient reports moderate pain in back 5/10. He requires min guard to stand from [facility] and min guard to ambulate in room with RW. Patient has small shuffle steps and decreased foot clearance bilaterally, decreased step length. He is at increased risk of falling.  Patient will continue to benefit from skilled PT while here to improve functional independence, safety and strengthening for return home independently.         Recommendations for follow up therapy are one component of a multi-disciplinary discharge planning process, led by the attending physician.  Recommendations may be updated based on patient status, additional functional criteria and insurance authorization.  Follow Up Recommendations Skilled nursing-short term rehab (<3 hours/day)    Assistance Recommended at Discharge    Patient can return home with the following  A little help with walking and/or transfers;A little help with bathing/dressing/bathroom;Assistance with cooking/housework;Assist for transportation;Help with stairs or ramp for entrance    Equipment Recommendations Rolling walker (2 wheels)  Recommendations for Other Services       Functional Status Assessment Patient has had a recent decline in their functional status and demonstrates the ability to make significant improvements in function in a  reasonable and predictable amount of time.     Precautions / Restrictions Precautions Precautions: Fall Spinal Brace: Thoracolumbosacral orthotic Restrictions Weight Bearing Restrictions: No      Mobility  Bed Mobility               General bed mobility comments: patient received on BSC and returned to chair. Bed mobility not tested    Transfers Overall transfer level: Needs assistance Equipment used: Rolling walker (2 wheels) Transfers: Sit to/from Stand Sit to Stand: Min assist           General transfer comment: min assist for sit to stand.    Ambulation/Gait Ambulation/Gait assistance: Min guard Gait Distance (Feet): 25 Feet Assistive device: Rolling walker (2 wheels) Gait Pattern/deviations: Shuffle, Decreased step length - right, Decreased step length - left, Decreased stride length Gait velocity: decr     General Gait Details: Patient ambulates with small shuffle steps. No lob while using RW.  Stairs            Wheelchair Mobility    Modified Rankin (Stroke Patients Only)       Balance Overall balance assessment: Needs assistance Sitting-balance support: Feet supported Sitting balance-Leahy Scale: Good     Standing balance support: Bilateral upper extremity supported, During functional activity, Reliant on assistive device for balance Standing balance-Leahy Scale: Fair Standing balance comment: min guard for safety with mobility                             Pertinent Vitals/Pain Pain Assessment Pain Assessment: 0-10 Pain Score: 5  Pain Location: lower back Pain Descriptors / Indicators: Aching, Sharp, Shooting Pain Intervention(s): Monitored during session,  Limited activity within patient's tolerance, Repositioned    Home Living Family/patient expects to be discharged to:: Private residence Living Arrangements: Alone   Type of Home: House Home Access: Stairs to enter   [employer] of Steps: 4   Home  Layout: One level [place];Cane - quad      Prior Function Prior Level of Function : Independent/Modified Independent             Mobility Comments: At baseline, pt is independent with quad cane for functional mobility. Reports no falls since 2021. ADLs Comments: At baseline, pt is independent with ADLs/IADLs. Reports that over past 2 weeks, has no been able to perform bed, tub/shower, and toilet transfers.     Hand Dominance        Extremity/Trunk Assessment   Upper Extremity Assessment Upper Extremity Assessment: Generalized weakness    Lower Extremity Assessment Lower Extremity Assessment: Generalized weakness    Cervical / Trunk Assessment Cervical / Trunk Assessment: Normal  Communication   Communication: No difficulties  Cognition Arousal/Alertness: Awake/alert Behavior During Therapy: WFL for tasks assessed/performed Overall Cognitive Status: Within Functional Limits for tasks assessed                                          General Comments      Exercises     Assessment/Plan    PT Assessment Patient needs continued PT services  PT Problem List Decreased strength;Decreased mobility;Decreased activity tolerance;Decreased balance;Pain;Decreased knowledge of use of DME;Decreased knowledge of precautions;Decreased safety awareness       PT Treatment Interventions Therapeutic activities;Gait training;Therapeutic exercise;Patient/family education;Functional mobility training;Stair training;DME instruction    PT Goals (Current goals can be found in the Care Plan section)  Acute Rehab PT Goals Patient Stated Goal: to improve mobility PT Goal Formulation: With patient Time For Goal Achievement: 02/09/21 Potential to Achieve Goals: Good    Frequency Min 2X/week     Co-evaluation               AM-PAC PT "6 Clicks" Mobility  Outcome Measure Help needed turning from your back to your side while in a  flat bed without using bedrails?: A Lot Help needed moving from lying on your back to sitting on the side of a flat bed without using bedrails?: A Lot Help needed moving to and from a bed to a chair (including a wheelchair)?: A Little Help needed standing up from a chair using your arms (e.g., wheelchair or bedside chair)?: A Little Help needed to walk in hospital room?: A Lot Help needed climbing 3-5 steps with a railing? : A Lot 6 Click Score: 14    End of Session Equipment Utilized During Treatment: Gait belt;Back brace Activity Tolerance: Patient limited by lethargy;Patient limited by pain Patient left: in chair;with call bell/phone within reach Nurse Communication: Mobility status PT Visit Diagnosis: Muscle weakness (generalized) (M62.81);Unsteadiness on feet (R26.81);Difficulty in walking, not elsewhere classified (R26.2);Pain Pain - part of body:  (back)    Time: 6546-5035 PT Time Calculation (min) (ACUTE ONLY): 13 min   Charges:   PT Evaluation $PT Eval Moderate Complexity: 1 Mod PT Treatments $Gait Training: 8-22 mins        Sonnia Strong, PT, GCS 01/26/21,3:21 PM

## 2021-01-26 NOTE — TOC Progression Note (Signed)
Transition of Care [facility]) - Progression Note    Patient Details  Name: Jacob [name] MRN: [id number] Date of Birth: 03-Aug-1928  Transition of Care [facility]) CM/SW [place], RN Phone Number: 01/26/2021, 9:34 AM  Clinical Narrative:   TOC acknowledges the consult for DC planning the patient lives alone, he has a compression fracture that makes mobility difficult, PT and OT are to evaluate the patient and provide recommendations, He goes to [name] for PCP and gets meds at [place], His son is Jacob [name] [phone], TOC to assist with DC planning after Pt and OT evals         Expected Discharge Plan and Services                                                 Social Determinants of Health (SDOH) Interventions    Readmission Risk Interventions No flowsheet data found.

## 2021-01-26 NOTE — TOC Progression Note (Signed)
Transition of Care [facility]) - Progression Note    Patient Details  Name: Jacob [name] MRN: [id number] Date of Birth: 11-11-1928  Transition of Care [facility]) CM/SW [place], RN Phone Number: 01/26/2021, 4:15 PM  Clinical Narrative:   Insurance auth process started to go to [employer] in [place], [place] [employer] pending, Uploaded clinical documents Ref number [id number]         Expected Discharge Plan and Services                                                 Social Determinants of Health (SDOH) Interventions    Readmission Risk Interventions No flowsheet data found.

## 2021-01-26 NOTE — Progress Notes (Signed)
PT Cancellation Note  Patient Details Name: Jacob [name] MRN: [id number] DOB: Mar 11, 1928   Cancelled Treatment:    Reason Eval/Treat Not Completed: Patient declined, no reason specified;Fatigue/lethargy limiting ability to participate. Requests that I return later. Will check in this pm.     Cristian Davitt 01/26/2021, 11:53 AM

## 2021-01-27 DIAGNOSIS — Z85528 Personal history of other malignant neoplasm of kidney: Secondary | ICD-10-CM | POA: Diagnosis not present

## 2021-01-27 DIAGNOSIS — N178 Other acute kidney failure: Secondary | ICD-10-CM | POA: Diagnosis not present

## 2021-01-27 DIAGNOSIS — E875 Hyperkalemia: Secondary | ICD-10-CM | POA: Diagnosis not present

## 2021-01-27 DIAGNOSIS — Z955 Presence of coronary angioplasty implant and graft: Secondary | ICD-10-CM | POA: Diagnosis not present

## 2021-01-27 DIAGNOSIS — N184 Chronic kidney disease, stage 4 (severe): Secondary | ICD-10-CM | POA: Diagnosis present

## 2021-01-27 DIAGNOSIS — M19042 Primary osteoarthritis, left hand: Secondary | ICD-10-CM | POA: Diagnosis present

## 2021-01-27 DIAGNOSIS — J449 Chronic obstructive pulmonary disease, unspecified: Secondary | ICD-10-CM | POA: Diagnosis present

## 2021-01-27 DIAGNOSIS — E871 Hypo-osmolality and hyponatremia: Secondary | ICD-10-CM | POA: Diagnosis not present

## 2021-01-27 DIAGNOSIS — S32020S Wedge compression fracture of second lumbar vertebra, sequela: Secondary | ICD-10-CM | POA: Diagnosis not present

## 2021-01-27 DIAGNOSIS — H409 Unspecified glaucoma: Secondary | ICD-10-CM | POA: Diagnosis present

## 2021-01-27 DIAGNOSIS — K5900 Constipation, unspecified: Secondary | ICD-10-CM | POA: Diagnosis present

## 2021-01-27 DIAGNOSIS — M19041 Primary osteoarthritis, right hand: Secondary | ICD-10-CM | POA: Diagnosis present

## 2021-01-27 DIAGNOSIS — Z880 Allergy status to penicillin: Secondary | ICD-10-CM | POA: Diagnosis not present

## 2021-01-27 DIAGNOSIS — I447 Left bundle-branch block, unspecified: Secondary | ICD-10-CM | POA: Diagnosis present

## 2021-01-27 DIAGNOSIS — I2510 Atherosclerotic heart disease of native coronary artery without angina pectoris: Secondary | ICD-10-CM | POA: Diagnosis present

## 2021-01-27 DIAGNOSIS — N400 Enlarged prostate without lower urinary tract symptoms: Secondary | ICD-10-CM | POA: Diagnosis present

## 2021-01-27 DIAGNOSIS — E785 Hyperlipidemia, unspecified: Secondary | ICD-10-CM | POA: Diagnosis present

## 2021-01-27 DIAGNOSIS — Z950 Presence of cardiac pacemaker: Secondary | ICD-10-CM | POA: Diagnosis not present

## 2021-01-27 DIAGNOSIS — M549 Dorsalgia, unspecified: Secondary | ICD-10-CM | POA: Diagnosis present

## 2021-01-27 DIAGNOSIS — Z8546 Personal history of malignant neoplasm of prostate: Secondary | ICD-10-CM | POA: Diagnosis not present

## 2021-01-27 DIAGNOSIS — E1122 Type 2 diabetes mellitus with diabetic chronic kidney disease: Secondary | ICD-10-CM | POA: Diagnosis present

## 2021-01-27 DIAGNOSIS — K219 Gastro-esophageal reflux disease without esophagitis: Secondary | ICD-10-CM | POA: Diagnosis present

## 2021-01-27 DIAGNOSIS — M8008XA Age-related osteoporosis with current pathological fracture, vertebra(e), initial encounter for fracture: Secondary | ICD-10-CM | POA: Diagnosis present

## 2021-01-27 DIAGNOSIS — I441 Atrioventricular block, second degree: Secondary | ICD-10-CM | POA: Diagnosis present

## 2021-01-27 DIAGNOSIS — Z8249 Family history of ischemic heart disease and other diseases of the circulatory system: Secondary | ICD-10-CM | POA: Diagnosis not present

## 2021-01-27 DIAGNOSIS — N179 Acute kidney failure, unspecified: Secondary | ICD-10-CM | POA: Diagnosis present

## 2021-01-27 DIAGNOSIS — Z20822 Contact with and (suspected) exposure to covid-19: Secondary | ICD-10-CM | POA: Diagnosis present

## 2021-01-27 DIAGNOSIS — I129 Hypertensive chronic kidney disease with stage 1 through stage 4 chronic kidney disease, or unspecified chronic kidney disease: Secondary | ICD-10-CM | POA: Diagnosis present

## 2021-01-27 LAB — CBC WITH DIFFERENTIAL/PLATELET
Abs Immature Granulocytes: 0.04 10*3/uL (ref 0.00–0.07)
Basophils Absolute: 0.1 10*3/uL (ref 0.0–0.1)
Basophils Relative: 1 %
Eosinophils Absolute: 0.1 10*3/uL (ref 0.0–0.5)
Eosinophils Relative: 1 %
HCT: 27.5 % — ABNORMAL LOW (ref 39.0–52.0)
Hemoglobin: 9 g/dL — ABNORMAL LOW (ref 13.0–17.0)
Immature Granulocytes: 0 %
Lymphocytes Relative: 13 %
Lymphs Abs: 1.1 10*3/uL (ref 0.7–4.0)
MCH: 30.3 pg (ref 26.0–34.0)
MCHC: 32.7 g/dL (ref 30.0–36.0)
MCV: 92.6 fL (ref 80.0–100.0)
Monocytes Absolute: 1.2 10*3/uL — ABNORMAL HIGH (ref 0.1–1.0)
Monocytes Relative: 13 %
Neutro Abs: 6.4 10*3/uL (ref 1.7–7.7)
Neutrophils Relative %: 72 %
Platelets: 217 10*3/uL (ref 150–400)
RBC: 2.97 MIL/uL — ABNORMAL LOW (ref 4.22–5.81)
RDW: 13.7 % (ref 11.5–15.5)
WBC: 9 10*3/uL (ref 4.0–10.5)
nRBC: 0 % (ref 0.0–0.2)

## 2021-01-27 LAB — BASIC METABOLIC PANEL
Anion gap: 9 (ref 5–15)
BUN: 52 mg/dL — ABNORMAL HIGH (ref 8–23)
CO2: 23 mmol/L (ref 22–32)
Calcium: 8.6 mg/dL — ABNORMAL LOW (ref 8.9–10.3)
Chloride: 102 mmol/L (ref 98–111)
Creatinine, Ser: 1.9 mg/dL — ABNORMAL HIGH (ref 0.61–1.24)
GFR, Estimated: 33 mL/min — ABNORMAL LOW (ref >60)
Glucose, Bld: 99 mg/dL (ref 70–99)
Potassium: 4.6 mmol/L (ref 3.5–5.1)
Sodium: 134 mmol/L — ABNORMAL LOW (ref 135–145)

## 2021-01-27 MED ORDER — HYDROMORPHONE HCL 1 MG/ML IJ SOLN
0.5000 mg | INTRAMUSCULAR | Status: DC | PRN
  Administered 2021-01-27 – 2021-01-29 (×8): 0.5 mg via INTRAVENOUS
  Filled 2021-01-27 (×9): qty 1

## 2021-01-27 MED ORDER — LACTATED RINGERS IV SOLN: INTRAVENOUS | Status: DC

## 2021-01-27 NOTE — TOC Progression Note (Signed)
Transition of Care [facility]) - Initial/Assessment Note    Patient Details  Name: Jacob [name] MRN: [id number] Date of Birth: [birth year]  Transition of Care [facility]) CM/SW Contact:    Conception Oms, RN Phone Number: 01/27/2021, 8:09 AM  Clinical Narrative:                 Received Ins approval [id number]        Patient Goals and CMS Choice        Expected Discharge Plan and Services                                                Prior Living Arrangements/Services                       Activities of Daily Living Home Assistive Devices/Equipment: Cane (specify quad or straight) ADL Screening (condition at time of admission) Patient's cognitive ability adequate to safely complete daily activities?: Yes Is the patient deaf or have difficulty hearing?: No Does the patient have difficulty seeing, even when wearing glasses/contacts?: No Does the patient have difficulty concentrating, remembering, or making decisions?: No Patient able to express need for assistance with ADLs?: Yes Does the patient have difficulty dressing or bathing?: Yes (last few days) Independently performs ADLs?: Yes (appropriate for developmental age) Does the patient have difficulty walking or climbing stairs?: Yes Weakness of Legs: None Weakness of Arms/Hands: None  Permission Sought/Granted                  Emotional Assessment              Admission diagnosis:  Back pain [M54.9] Elevated PSA [R97.20] Constipation, unspecified constipation type [K59.00] Compression fracture of L2 vertebra with routine healing, subsequent encounter [S32.020D] Patient Active Problem List   Diagnosis Date Noted   CKD stage 4 secondary to hypertension ([place]) 01/26/2021   Closed compression fracture of L2 lumbar vertebra, sequela 01/26/2021   Hyperkalemia 01/26/2021   Constipation 01/25/2021   Back pain 01/25/2021   Type 2 diabetes mellitus without complication, without long-term  current use of insulin ([place]) 08/29/2017   History of bladder cancer 05/09/2016   Nodule of left lung 05/07/2016   COPD (chronic obstructive pulmonary disease) ([place]) 03/28/2015   Bladder carcinoma ([place]) 03/28/2015   Acute and chronic respiratory failure with hypoxia ([place]) 03/28/2015   GERD (gastroesophageal reflux disease) 03/28/2015   Pacemaker 12/01/2013   Mobitz type 2 second degree AV block 12/01/2013   Acquired solitary kidney 10/27/2013   CKD (chronic kidney disease) stage 3, GFR 30-59 ml/min ([place]) 10/27/2013   Personal history of renal cell cancer 10/27/2013   Bradycardia 08/28/2013   Symptomatic bradycardia 08/28/2013   Sinus bradycardia    Renal insufficiency    HTN (hypertension)    Renal cancer, left ([place])    Urothelial carcinoma of kidney ([place]) 08/22/2013   Left bundle branch block 02/14/2013   CAD (coronary artery disease)    Hyperlipidemia    PCP:  [name], MD Pharmacy:   [facility] [address] ([address]), [place] [address] [place]) [place] [address] Phone: [phone] Fax: [phone]     Social Determinants of Health (SDOH) Interventions    Readmission Risk Interventions No flowsheet data found.

## 2021-01-27 NOTE — Progress Notes (Signed)
Occupational Therapy Treatment Patient Details Name: Jacob [name] MRN: [id number] DOB: July 09, 1928 Today's Date: 01/27/2021   History of present illness 18 M who presented to ED 1/17 with back pain. Pt was found to have L2 compression fracture and was ordered to wear TLSO and follow up with outpatient neurosurgery. Pt was discharged, but then returned to ED 1/24 with lower back pain and constipation x2 weeks. Past medical history significant for GERD, diverticulitis, HTN, HDL, renal cancer, and prostate cancer.   OT comments  Jacob [name] continues to have back pain; he requests pain meds during session. RN notified and administered. Pt received on BSC, where he had been sitting for over 1 hr, hoping to have a BM but not successful. Transferred into standing with RW, SUPV, and ambulated with CGA. Able to maintain standing balance, use urinal in standing with no LOB. Attempted various sitting, standing postures to see if any provided pain relief, but pain continued no matter position. Provided educ re: donning/doffing brace, with pt and son verbalizing understanding and providing teach-back, however, would benefit from continuing to review this information. Anticipate DC to STR.    Recommendations for follow up therapy are one component of a multi-disciplinary discharge planning process, led by the attending physician.  Recommendations may be updated based on patient status, additional functional criteria and insurance authorization.    Follow Up Recommendations  Skilled nursing-short term rehab (<3 hours/day)    Assistance Recommended at Discharge Intermittent Supervision/Assistance  Patient can return home with the following  A lot of help with walking and/or transfers;A lot of help with bathing/dressing/bathroom;Assist for transportation;Assistance with cooking/housework   Equipment Recommendations       Recommendations for Other Services      Precautions / Restrictions  Precautions Precautions: Fall Required Braces or Orthoses: Spinal Brace Spinal Brace: Thoracolumbosacral orthotic Restrictions Weight Bearing Restrictions: No Other Position/Activity Restrictions: Pt provided with TLSO following dx of L2 compression fx; MD contacted (via secure) to confirm wearing schedule- to be worn when out of bed.       Mobility Bed Mobility Overal bed mobility: Needs Assistance             General bed mobility comments: pt received on BSC, left in recliner    Transfers Overall transfer level: Needs assistance Equipment used: Rolling walker (2 wheels) Transfers: Sit to/from Stand, Bed to chair/wheelchair/BSC Sit to Stand: Supervision     Step pivot transfers: Min guard     General transfer comment: Provided SUPV for safety, but no physical assist needed     Balance Overall balance assessment: Needs assistance Sitting-balance support: Feet supported Sitting balance-Leahy Scale: Good     Standing balance support: During functional activity, Single extremity supported, Bilateral upper extremity supported Standing balance-Leahy Scale: Good Standing balance comment: Pt able to maintain standing balance for ~ 5 minutes while engaged in fxl mobility with 1 UE on RW                           ADL either performed or assessed with clinical judgement   ADL                                              Extremity/Trunk Assessment Upper Extremity Assessment Upper Extremity Assessment: Generalized weakness   Lower Extremity Assessment Lower Extremity Assessment: Generalized weakness  Cervical / Trunk Assessment Cervical / Trunk Assessment: Normal    Vision       Perception     Praxis      Cognition Arousal/Alertness: Awake/alert Behavior During Therapy: WFL for tasks assessed/performed Overall Cognitive Status: Within Functional Limits for tasks assessed                                           Exercises Other Exercises Other Exercises: sit-to-stand, transfers, toileting, ambulation, standing balance tolerance, educ re: donning/doffing TLSO    Shoulder Instructions       General Comments      Pertinent Vitals/ Pain       Pain Assessment Pain Assessment: 0-10 Pain Score: 6  Faces Pain Scale: Hurts even more Pain Location: lower back Pain Descriptors / Indicators: Sore, Grimacing, Guarding Pain Intervention(s): RN gave pain meds during session, Monitored during session, Repositioned, Limited activity within patient's tolerance  Home Living                                          Prior Functioning/Environment              Frequency  Min 2X/week        Progress Toward Goals  OT Goals(current goals can now be found in the care plan section)  Progress towards OT goals: Progressing toward goals  Acute Rehab OT Goals Patient Stated Goal: to not be in pain OT Goal Formulation: With patient Time For Goal Achievement: 02/09/21 Potential to Achieve Goals: Good  Plan Discharge plan remains appropriate;Frequency remains appropriate    Co-evaluation                 AM-PAC OT "6 Clicks" Daily Activity     Outcome Measure   Help from another person eating meals?: None Help from another person taking care of personal grooming?: A Little Help from another person toileting, which includes using toliet, bedpan, or urinal?: A Little Help from another person bathing (including washing, rinsing, drying)?: A Lot Help from another person to put on and taking off regular upper body clothing?: A Little Help from another person to put on and taking off regular lower body clothing?: A Lot 6 Click Score: 17    End of Session Equipment Utilized During Treatment: Rolling walker (2 wheels);Back brace  OT Visit Diagnosis: Unsteadiness on feet (R26.81);Muscle weakness (generalized) (M62.81);Pain   Activity Tolerance Patient tolerated treatment  well   Patient Left in chair;with call bell/phone within reach;with chair alarm set;with family/visitor present   Nurse Communication          Time: 9798-9211 OT Time Calculation (min): 19 min  Charges: OT General Charges $OT Visit: 1 Visit OT Treatments $Self Care/Home Management : 8-22 mins  Josiah Lobo, PhD, MS, OTR/L 01/27/21, 4:33 PM

## 2021-01-27 NOTE — Consult Note (Signed)
Neurosurgery-New Consultation Evaluation 01/27/2021 Jacob [name] [id number]  Identifying Statement: Jacob [name] is a 88 y.o. male from [place] [address] with compression fracture  Physician Requesting Consultation: Sharen Hones, MD  History of Present Illness: Jacob [name] is here for evaluation of back pain and found to have a L2 compression fracture. He is in TLSO now and states he continues to have back pain but the medication is helping. He is in the chair now. He states he has no radiating leg pain. He denies any numbness or weakness. He does not have a history of trauma but believes this may be result of severe coughing episode.   Past Medical History:  Past Medical History:  Diagnosis Date   Arthritis    hands   At risk for sleep apnea    STOP-BANG=  4     SENT TO PCP [id number]   BPH (benign prostatic hypertrophy)    Coronary artery disease cardiologist-- dr Fletcher Anon  and dr gregg taylor   a. 2006 s/p PCI/Stenting x 2 to the RCA;  b. 05/03/12 Cardiac CT: Patent RCA stent, nonobs dzs;  c. 01/2013 Ex MV: nl EF, no ischemia.   GERD (gastroesophageal reflux disease)    Glaucoma of both eyes    H/O hiatal hernia    History of atrial fibrillation    a. 2006.   History of diverticulitis    History of kidney stones    History of renal cell carcinoma    08-22-2013  s/p  left nephrouretectomy   Hyperlipidemia    Hyperlipidemia    Hypertension    LBBB (left bundle branch block)    Mobitz type 2 second degree AV block    Recurrent bladder transitional cell carcinoma ([place]) monitored by dr [name]   s/p turb's  and chemo bladder instillations   Renal insufficiency    a. Creat rose to 2.34 post-op L nephrectomy.   S/P placement of cardiac pacemaker [employer] last pacer check 12-01-2013   08-29-2013  for symptomatic bradycardia and mobitz 2 second degree heart block   Symptomatic bradycardia    s/p  pacemaker placement 08-29-2013    Social History: Social History    Socioeconomic History   Marital status: Widowed    Spouse name: Not on file   Number of children: Not on file   Years of education: Not on file   Highest education level: Not on file  Occupational History   Not on file  Tobacco Use   Smoking status: Former    Packs/day: 1.00    Years: 50.00    Pack years: 50.00    Types: Cigarettes    Quit date: 11/02/2000    Years since quitting: 20.2   Smokeless tobacco: Never  Vaping Use   Vaping Use: Never used  Substance and Sexual Activity   Alcohol use: No   Drug use: No   Sexual activity: Not on file  Other Topics Concern   Not on file  Social History Narrative   Not on file   Social Determinants of Health   Financial Resource Strain: Not on file  Food Insecurity: Not on file  Transportation Needs: Not on file  Physical Activity: Not on file  Stress: Not on file  Social Connections: Not on file  Intimate Partner Violence: Not on file    Family History: Family History  Problem Relation Age of Onset   Heart attack Mother    Heart attack Father    Prostate cancer Neg Hx  Bladder Cancer Neg Hx    Kidney cancer Neg Hx     Review of Systems:  Review of Systems - General ROS: Negative Psychological ROS: Negative Ophthalmic ROS: Negative ENT ROS: Negative Hematological and Lymphatic ROS: Negative  Endocrine ROS: Negative Respiratory ROS: Negative Cardiovascular ROS: Negative Gastrointestinal ROS: Negative Genito-Urinary ROS: Negative Musculoskeletal ROS: Positive for back pain Neurological ROS: Negative for leg pain, numbness Dermatological ROS: Negative  Physical Exam: BP (!) 103/51 (BP Location: Right Arm)    Pulse 68    Temp 98.3 F (36.8 C)    Resp 18    Ht 5\' 6"  (1.676 m)    Wt 86.2 kg    SpO2 96%    BMI 30.67 kg/m  Body mass index is 30.67 kg/m. Body surface area is 2 meters squared. General appearance: Alert, cooperative, in no acute distress Head: Normocephalic, atraumatic Eyes: Normal, EOM  intact Oropharynx: Moist without lesions Back: In TLSO Ext: No edema in LE bilaterally  Neurologic exam:  Mental status: alertness: alert, affect: normal Speech: fluent and clear Motor:strength symmetric 5/5 in bilateral lower extremities Sensory: intact to light touch in bilateral lower extremities Gait: not tested   Imaging: CT: Small inguinal hernias containing only fat. Anterior abdominal wall hernia as seen previously containing fat. Mild intrusion of small intestine but without evidence of incarceration or obstruction. Recent L2 compression fracture. Slight further progression of collapse since the prior study. No retropulsed bone.   Impression/Plan:  Jacob [name] is here with a  L2 compression fracture. We discussed need of brace and he is in TLSO. He may benefit from kyphoplasty and IR has been consulted. At this time, no surgery is indicated but we discussed importance of brace and no falls. We can follow up as outpatient. He needs TLSO when up moving.    1.  Diagnosis: L2 compression fracture  2.  Plan - TLSO Follow up 3-4 weeks in clinic [place] for kyphoplasty

## 2021-01-27 NOTE — Progress Notes (Signed)
Physical Therapy Treatment Patient Details Name: Jacob [name] MRN: [id number] DOB: 1928-04-27 Today's Date: 01/27/2021   History of Present Illness 44 M who presented to ED 1/17 with back pain. Pt was found to have L2 compression fracture and was ordered to wear TLSO and follow up with outpatient neurosurgery. Pt was discharged, but then returned to ED 1/24 with lower back pain and constipation x2 weeks. Past medical history significant for GERD, diverticulitis, HTN, HDL, renal cancer, and prostate cancer.    PT Comments    Patient reports he is very thirsty. Unable to eat or drink as his kyphoplasty may be moved up to today. Patient continues to have severe pain with mobility. Requires mod assist for bed mobility. Min assist for transfers sit to stand and bed to recliner. He will continue to benefit from skilled PT while here to improve functional independence, safety with mobility.        Recommendations for follow up therapy are one component of a multi-disciplinary discharge planning process, led by the attending physician.  Recommendations may be updated based on patient status, additional functional criteria and insurance authorization.  Follow Up Recommendations  Skilled nursing-short term rehab (<3 hours/day)     Assistance Recommended at Discharge Intermittent Supervision/Assistance  Patient can return home with the following A little help with walking and/or transfers;A little help with bathing/dressing/bathroom;Assistance with cooking/housework;Assist for transportation;Help with stairs or ramp for entrance   Equipment Recommendations  Rolling walker (2 wheels)    Recommendations for Other Services       Precautions / Restrictions Precautions Precautions: Fall Required Braces or Orthoses: Spinal Brace Spinal Brace: Thoracolumbosacral orthotic Restrictions Weight Bearing Restrictions: No Other Position/Activity Restrictions: Pt provided with TLSO following dx of L2  compression fx; MD contacted (via secure) to confirm wearing schedule- to be worn when out of bed.     Mobility  Bed Mobility Overal bed mobility: Needs Assistance Bed Mobility: Rolling, Sidelying to Sit Rolling: Min assist Sidelying to sit: Mod assist       General bed mobility comments: due to pain, patient requires min/mod assist with rolling and side lying to sit.    Transfers Overall transfer level: Needs assistance Equipment used: 1 person hand held assist Transfers: Sit to/from Stand Sit to Stand: Min assist           General transfer comment: min assist for sit to stand.    Ambulation/Gait Ambulation/Gait assistance: Min assist Gait Distance (Feet): 2 Feet Assistive device: 1 person hand held assist Gait Pattern/deviations: Step-to pattern, Decreased step length - right, Decreased step length - left Gait velocity: decr     General Gait Details: patient having a lot of pain and is unable to take pain medicine as his kyphoplasty mey be today. Performed minimal mobility to tolerance.   Stairs             Wheelchair Mobility    Modified Rankin (Stroke Patients Only)       Balance Overall balance assessment: Needs assistance Sitting-balance support: Feet supported Sitting balance-Leahy Scale: Good Sitting balance - Comments: Requires SUPERVISION for static sitting and EOB   Standing balance support: During functional activity, Single extremity supported Standing balance-Leahy Scale: Fair Standing balance comment: min assist for transferring to recliner from bed                            Cognition Arousal/Alertness: Awake/alert Behavior During Therapy: [facility] [place] for tasks assessed/performed Overall Cognitive  Status: Within Functional Limits for tasks assessed                                          Exercises      General Comments        Pertinent Vitals/Pain Pain Assessment Pain Assessment: Faces Faces Pain  Scale: Hurts whole lot Pain Location: lower back Pain Descriptors / Indicators: Sore, Grimacing, Guarding Pain Intervention(s): Limited activity within patient's tolerance, Monitored during session, Repositioned    Home Living                          Prior Function            PT Goals (current goals can now be found in the care plan section) Acute Rehab PT Goals Patient Stated Goal: to improve mobility, decrease pain PT Goal Formulation: With patient Time For Goal Achievement: 02/09/21 Potential to Achieve Goals: Good Progress towards PT goals: Not progressing toward goals - comment (continues to have severe pain limiting mobility)    Frequency    7X/week      PT Plan Frequency needs to be updated    Co-evaluation              AM-PAC PT "6 Clicks" Mobility   Outcome Measure  Help needed turning from your back to your side while in a flat bed without using bedrails?: A Lot Help needed moving from lying on your back to sitting on the side of a flat bed without using bedrails?: A Lot Help needed moving to and from a bed to a chair (including a wheelchair)?: A Little Help needed standing up from a chair using your arms (e.g., wheelchair or bedside chair)?: A Little Help needed to walk in hospital room?: A Little Help needed climbing 3-5 steps with a railing? : A Lot 6 Click Score: 15    End of Session Equipment Utilized During Treatment: Back brace Activity Tolerance: Patient limited by pain Patient left: in chair;with call bell/phone within reach;with chair alarm set Nurse Communication: Mobility status PT Visit Diagnosis: Muscle weakness (generalized) (M62.81);Unsteadiness on feet (R26.81);Difficulty in walking, not elsewhere classified (R26.2);Pain Pain - part of body:  (back)     Time: 1045-1101 PT Time Calculation (min) (ACUTE ONLY): 16 min  Charges:  $Therapeutic Activity: 8-22 mins                     Jehan Ranganathan, PT,  [facility] 01/27/21,11:10 AM

## 2021-01-27 NOTE — Consult Note (Signed)
Chief Complaint: Patient was seen in consultation today for  Chief Complaint  Patient presents with   Back Pain   Constipation    Referring Physician(s): Dr. Roosevelt Locks  Supervising Physician: Markus Daft  Patient Status: [name] - In-pt  History of Present Illness: Jacob [name] is a 88 y.o. male with a medical history significant for CAD s/p PCI with stent placements x2 (2006), atrial fibrillation, Mobitz type 2 second degree AV block, symptomatic bradycardia (pacemaker 2015), and renal cell carcinoma s/p left nephroureterectomy. He presented to the [facility] 01/25/21 with complaints of worsening back pain and constipation. Patient had been recently diagnosed with an L2 compression fracture 01/18/21 and repeat imaging obtained 01/25/21 showed further collapse of the L2 compression fracture.  Interventional Radiology has been asked to evaluate this patient for an image-guided lumbar 2 kyphoplasty/vertebroplasty. Imaging reviewed and procedure approved by Dr. Anselm Pancoast.   Past Medical History:  Diagnosis Date   Arthritis    hands   At risk for sleep apnea    STOP-BANG=  4     SENT TO PCP [id number]   BPH (benign prostatic hypertrophy)    Coronary artery disease cardiologist-- dr Fletcher Anon  and dr gregg taylor   a. 2006 s/p PCI/Stenting x 2 to the RCA;  b. 05/03/12 Cardiac CT: Patent RCA stent, nonobs dzs;  c. 01/2013 Ex MV: nl EF, no ischemia.   GERD (gastroesophageal reflux disease)    Glaucoma of both eyes    H/O hiatal hernia    History of atrial fibrillation    a. 2006.   History of diverticulitis    History of kidney stones    History of renal cell carcinoma    08-22-2013  s/p  left nephrouretectomy   Hyperlipidemia    Hyperlipidemia    Hypertension    LBBB (left bundle branch block)    Mobitz type 2 second degree AV block    Recurrent bladder transitional cell carcinoma ([place]) monitored by dr [name]   s/p turb's  and chemo bladder instillations   Renal insufficiency    a.  Creat rose to 2.34 post-op L nephrectomy.   S/P placement of cardiac pacemaker MEDTRONIC last pacer check 12-01-2013   08-29-2013  for symptomatic bradycardia and mobitz 2 second degree heart block   Symptomatic bradycardia    s/p  pacemaker placement 08-29-2013    Past Surgical History:  Procedure Laterality Date   APPENDECTOMY  02/1988   CATARACT EXTRACTION W/ INTRAOCULAR LENS  IMPLANT, BILATERAL  2011   CORONARY ANGIOPLASTY WITH STENT PLACEMENT  2006    [facility]   X2 STENTS  TO RCA   CYSTOSCOPY W/ RETROGRADES Right 01/13/2014   Procedure: CYSTOSCOPY WITH RETROGRADE PYELOGRAM;  Surgeon: [name], MD;  Location: [facility];  Service: Urology;  Laterality: Right;   CYSTOSCOPY W/ URETERAL STENT PLACEMENT Left 08/22/2013   Procedure: CYSTOSCOPY WITH RETROGRADE PYELOGRAM/URETERAL STENT PLACEMENT;  Surgeon: [name], MD;  Location: [facility];  Service: Urology;  Laterality: Left;   CYSTOSCOPY WITH BIOPSY  04/13/2011     Procedure: CYSTOSCOPY WITH BIOPSY;  Surgeon: [name], MD;  Location: [facility];  Service: Urology;  Laterality: N/A;  Cold Cup Biopsy   CYSTOSCOPY WITH RETROGRADE PYELOGRAM, URETEROSCOPY AND STENT PLACEMENT Left 06/19/2013   Procedure: CYSTOSCOPY WITH LEFT RETROGRADE PYELOGRAM, LEFT URETEROSCOPY, ureteral balloon dilatation, BIOPSY LEFT KIDNEY;  Surgeon: [name], MD;  Location: [facility];  Service: Urology;  Laterality: Left;   INGUINAL HERNIA REPAIR  Left 08/22/2013   Procedure: LAPAROSCOPIC INGUINAL HERNIA;  Surgeon: [name], MD;  Location: [facility];  Service: Urology;  Laterality: Left;   PERMANENT PACEMAKER INSERTION N/A 08/29/2013   Procedure: PERMANENT PACEMAKER INSERTION;  Surgeon: [name], MD;  Location: [facility];  Service: Cardiovascular;  Laterality: N/A;  Medtronic dual-chamber   ROBOT ASSITED LAPAROSCOPIC NEPHROURETERECTOMY Left 08/22/2013   Procedure: ROBOT ASSITED LAPAROSCOPIC NEPHROURETERECTOMY;   Surgeon: [name], MD;  Location: [facility];  Service: Urology;  Laterality: Left;   TRANSTHORACIC ECHOCARDIOGRAM  08-29-2013   mild LVH/  ef 60-65% /  grade I diastolic dysfunction/  mild MR/  mild LAE  and RAE/  moderate TR   TRANSURETHRAL RESECTION OF BLADDER TUMOR  12-22-2010   TRANSURETHRAL RESECTION OF BLADDER TUMOR  08/04/2011   Procedure: TRANSURETHRAL RESECTION OF BLADDER TUMOR (TURBT);  Surgeon: [name], MD;  Location: [facility];  Service: Urology;  Laterality: N/A;   TRANSURETHRAL RESECTION OF BLADDER TUMOR N/A 01/13/2014   Procedure: TRANSURETHRAL RESECTION OF BLADDER TUMOR (TURBT);  Surgeon: [name], MD;  Location: [facility];  Service: Urology;  Laterality: N/A;    Allergies: Penicillins  Medications: Prior to Admission medications   Medication Sig Start Date End Date Taking? Authorizing Provider  HYDROcodone-acetaminophen (NORCO/VICODIN) 5-325 MG tablet Take 1 tablet by mouth 2 (two) times daily as needed. 01/24/21  Yes [provider]  senna-docusate (SENOKOT-S) 8.6-50 MG per tablet Take 1 tablet by mouth 2 (two) times daily. While taking pain meds to prevent constipation 08/26/13  Yes [name], MD  acetaminophen (TYLENOL) 325 MG tablet Take 650 mg by mouth every 4 (four) hours as needed.    [provider]  aspirin 81 MG tablet Take 81 mg by mouth daily.    [provider]  atorvastatin (LIPITOR) 40 MG tablet TAKE 1 TABLET BY MOUTH ONCE DAILY. STOP LOVASTATIN. 11/24/20   Deboraha Sprang, MD  cetirizine (ZYRTEC) 10 MG tablet Take 10 mg by mouth daily.    [provider]  esomeprazole (NEXIUM) 20 MG capsule Take 20 mg by mouth daily. 05/24/20   [provider]  FEROSUL 325 (65 Fe) MG tablet Take 325 mg by mouth daily. Patient not taking: Reported on 01/26/2021 05/24/20   [provider]  isosorbide mononitrate (IMDUR) 30 MG 24 hr tablet Take 30 mg by mouth at bedtime.     [provider]  latanoprost (XALATAN) 0.005 % ophthalmic solution Place 1 drop into both eyes at bedtime.    [provider]  losartan (COZAAR) 50 MG tablet Take 1 tablet by mouth every morning.  11/05/13   [provider]  Multiple Vitamins-Minerals (MULTIVITAMINS THER. W/MINERALS) TABS Take 1 tablet by mouth daily.    [provider]  Omega-3 Fatty Acids (FISH OIL) 1200 MG CAPS Take 1,200 mg by mouth daily.    [provider]  oxyCODONE (ROXICODONE) 5 MG immediate release tablet Take 1 tablet (5 mg total) by mouth every 12 (twelve) hours as needed. 01/18/21 01/18/22  Triplett, Dessa Phi, FNP  PROAIR HFA 108 (90 Base) MCG/ACT inhaler Inhale 1 puff into the lungs every 4 (four) hours as needed.  06/14/16   [provider]  SYMBICORT 160-4.5 MCG/ACT inhaler INHALE 2 PUFFS INTO LUNGS TWICE DAILY. RINSE MOUTH AFTER USE. Patient not taking: Reported on 01/26/2021 01/03/18   Laverle Hobby, MD  Vibegron (GEMTESA) 75 MG TABS Take 75 mg by mouth daily. Patient not taking: Reported on  01/26/2021 09/23/20   [name], MD     Family History  Problem Relation Age of Onset   Heart attack Mother    Heart attack Father    Prostate cancer Neg Hx    Bladder Cancer Neg Hx    Kidney cancer Neg Hx     Social History   Socioeconomic History   Marital status: Widowed    Spouse name: Not on file   Number of children: Not on file   Years of education: Not on file   Highest education level: Not on file  Occupational History   Not on file  Tobacco Use   Smoking status: Former    Packs/day: 1.00    Years: 50.00    Pack years: 50.00    Types: Cigarettes    Quit date: 11/02/2000    Years since quitting: 20.2   Smokeless tobacco: Never  Vaping Use   Vaping Use: Never used  Substance and Sexual Activity   Alcohol use: No   Drug use: No   Sexual activity: Not on file  Other Topics Concern   Not on file  Social History Narrative   Not on file    Social Determinants of Health   Financial Resource Strain: Not on file  Food Insecurity: Not on file  Transportation Needs: Not on file  Physical Activity: Not on file  Stress: Not on file  Social Connections: Not on file    Review of Systems: A 12 point ROS discussed and pertinent positives are indicated in the HPI above.  All other systems are negative.  Review of Systems  Constitutional:  Positive for fatigue. Negative for appetite change.  Respiratory:  Negative for cough and shortness of breath.   Cardiovascular:  Negative for chest pain and leg swelling.  Gastrointestinal:  Positive for constipation. Negative for nausea and vomiting.  Musculoskeletal:  Positive for back pain.  Neurological:  Negative for headaches.   Vital Signs: BP (!) 141/49 (BP Location: Left Arm)    Pulse 65    Temp 97.9 F (36.6 C)    Resp 18    Ht '5\' 6"'  (1.676 m)    Wt 190 lb (86.2 kg)    SpO2 95%    BMI 30.67 kg/m   Physical Exam Constitutional:      General: He is not in acute distress.    Appearance: He is not ill-appearing.  Pulmonary:     Effort: Pulmonary effort is normal.  Musculoskeletal:     Comments: Severe back pain/tenderness to palpation at the L2 level.   Skin:    General: Skin is warm and dry.  Neurological:     Mental Status: He is alert and oriented to person, place, and time.    Imaging: CT ABDOMEN PELVIS WO CONTRAST  Result Date: 01/25/2021 CLINICAL DATA:  Abdominal pain, acute, nonlocalized. Constipation over the last 2 weeks. Recent L2 compression fracture. EXAM: CT ABDOMEN AND PELVIS WITHOUT CONTRAST TECHNIQUE: Multidetector CT imaging of the abdomen and pelvis was performed following the standard protocol without IV contrast. RADIATION DOSE REDUCTION: This exam was performed according to the departmental dose-optimization program which includes automated exposure control, adjustment of the mA and/or kV according to patient size and/or use of iterative reconstruction  technique. COMPARISON:  01/18/2021 FINDINGS: Lower chest: Chronic scarring at the left lung base. Previous pacemaker placement. Hepatobiliary: Redemonstration of an exophytic liver bump along the anterior margin, unchanged from previous studies. Liver parenchyma is otherwise normal without contrast. No calcified gallstones.  Pancreas: Normal Spleen: Small but normal spleen. Adrenals/Urinary Tract: Adrenal glands are normal. The left kidney is surgically absent. 1 mm upper pole renal stone on the right as seen previously. No hydronephrosis. Stomach/Bowel: Stomach and small intestine are unremarkable. Patient does have an increased amount of stool now present throughout the colon consistent with a degree of constipation. There is diverticulosis of the colon without evidence of diverticulitis. Vascular/Lymphatic: Aortic atherosclerosis. Maximal diameter of the infrarenal abdominal aorta is 2.9 cm as seen previously. No retroperitoneal adenopathy. Reproductive: Mildly enlarged prostate. Other: No free fluid or air. Musculoskeletal: Small inguinal hernias containing only fat. Anterior abdominal wall hernia as seen previously containing fat. Mild intrusion of small intestine but without evidence of incarceration or obstruction. Recent L2 compression fracture. Slight further progression of collapse since the prior study. No retropulsed bone. IMPRESSION: Slight further collapse of the L2 compression fracture compared to the study of 1 week ago. Vertebral augmentation could be considered. Increased amount of fecal matter throughout the colon when compared to the prior study, consistent with a degree of constipation. No other acute or significant finding. Electronically Signed   By: Nelson Chimes M.D.   On: 01/25/2021 16:50   CT ABDOMEN PELVIS WO CONTRAST  Result Date: 01/18/2021 CLINICAL DATA:  Abdominal pain, acute, nonlocalized. Low back pain for 5 days. EXAM: CT ABDOMEN AND PELVIS WITHOUT CONTRAST TECHNIQUE:  Multidetector CT imaging of the abdomen and pelvis was performed following the standard protocol without IV contrast. RADIATION DOSE REDUCTION: This exam was performed according to the departmental dose-optimization program which includes automated exposure control, adjustment of the mA and/or kV according to patient size and/or use of iterative reconstruction technique. COMPARISON:  Abdominopelvic CT 02/08/2016. chest CT 03/18/2019. FINDINGS: Lower chest: Scarring in the lingula is incompletely visualized, although grossly stable from previous chest CT. Emphysematous changes are present at both lung bases. Patient has a pacemaker and atherosclerosis of the aorta and coronary arteries. There is a small hiatal hernia. No significant pleural effusion. Hepatobiliary: A previously demonstrated partially exophytic anterior liver lesion measures 2.7 x 1.6 cm on image 13/2, previously 3.0 x 1.6 cm. No suspicious hepatic findings on noncontrast imaging. No evidence of gallstones, gallbladder wall thickening or biliary dilatation. Pancreas: Unremarkable. No pancreatic ductal dilatation or surrounding inflammatory changes. Spleen: Normal in size without focal abnormality. Adrenals/Urinary Tract: Both adrenal glands appear normal. Punctate calcification in the upper pole of the right kidney (image 66/5) may reflect a small nonobstructing calculus or renal vascular calcification. No evidence of ureteral calculus or hydronephrosis. The left kidney is surgically absent. The bladder appears unremarkable. Stomach/Bowel: No enteric contrast administered. The stomach appears unremarkable for its degree of distension. No evidence of bowel wall thickening, distention or surrounding inflammatory change. Mildly prominent stool throughout the colon. There are diverticular changes within the sigmoid colon. Vascular/Lymphatic: There are no enlarged abdominal or pelvic lymph nodes. Aortic and branch vessel atherosclerosis with stable mild  dilatation of the infrarenal aorta which has a maximal AP diameter of 2.9 cm. This fusiform dilatation is not greater than 50% greater than the more proximal aorta-no focal aneurysm. Reproductive: The prostate gland and seminal vesicles appear unremarkable. Other: A left periumbilical hernia is again noted, slightly smaller than on the previous study and no longer containing small bowel. No ascites or free air. Musculoskeletal: New mild superior endplate compression deformity at L2. See separate report of the lumbar spine. No other acute or significant osseous findings identified. IMPRESSION: 1. New superior endplate compression fracture at  L2. See report of the lumbar spine dictated separately. 2. No other acute or significant findings identified. 3. Possible tiny nonobstructing right renal calculus. 4. Similar left periumbilical hernia containing fat, distal colonic diverticulosis and Aortic Atherosclerosis (ICD10-I70.0). Electronically Signed   By: Richardean Sale M.D.   On: 01/18/2021 14:13   DG Lumbar Spine 2-3 Views  Result Date: 01/18/2021 CLINICAL DATA:  88 year old male with low back pain for 4 days. No known injury. EXAM: LUMBAR SPINE - 2-3 VIEW COMPARISON:  CT Abdomen and Pelvis 02/08/2016.  Chest CT 03/18/2019. FINDINGS: Twelve full size ribs. Therefore, partially lumbarized S1 level with full size S1-S2 disc space. Correlation with radiographs is recommended prior to any operative intervention. Mild L2 superior endplate compression is new since 2018. Stable lower thoracic and lumbar vertebral height and alignment elsewhere. Chronic disc space loss at L5-S1. Relatively preserved disc spaces elsewhere. Chronic flowing endplate osteophytes in the lower thoracic spine. Grossly intact visible sacrum. Calcified aortic atherosclerosis. Chronic infrarenal abdominal aortic aneurysm was 30-31 mm diameter in 2018. Size might be up to 36 mm now although likely there is a degree of magnification on the lateral  view. Negative visible bowel gas pattern. Cardiac pacemaker lead. IMPRESSION: 1. Transitional anatomy. Mild L2 superior endplate compression is new since 2018 and suspicious for acute compression fracture in this setting. If specific therapy such as vertebroplasty is desired, Lumbar MRI or Nuclear Medicine Whole-body Bone Scan would best determine acuity. 2. No other acute osseous abnormality identified in the lumbar spine. 3. Mild chronic infrarenal abdominal aortic aneurysm, difficult to exclude mild enlargement since 2018. Recommend follow-up ultrasound every 3 years. This recommendation follows ACR consensus guidelines: White Paper of the ACR Incidental Findings Committee II on Vascular Findings. J Am Coll Radiol 2013; 10:789-794. Aortic Atherosclerosis (ICD10-I70.0). Electronically Signed   By: Genevie Ann M.D.   On: 01/18/2021 11:48   DG Abdomen 1 View  Result Date: 01/25/2021 CLINICAL DATA:  Constipation EXAM: ABDOMEN - 1 VIEW COMPARISON:  None. FINDINGS: Normal bowel gas pattern. Moderate amount of stool in the colon. Small amount of stool in the rectum. No acute skeletal abnormality. Dual lead pacemaker. Electronic device overlying the epigastric region. IMPRESSION: Moderate retained stool in the colon.  Normal bowel gas pattern. Electronically Signed   By: Franchot Gallo M.D.   On: 01/25/2021 13:43   CT L-SPINE NO CHARGE  Result Date: 01/18/2021 CLINICAL DATA:  Abdominal pain, acute, nonlocalized. Low back pain for 5 days. EXAM: CT LUMBAR SPINE WITHOUT CONTRAST TECHNIQUE: Multiplanar CT image reconstructions of the lumbar spine were generated from data acquired during abdominopelvic CT of the same date. RADIATION DOSE REDUCTION: This exam was performed according to the departmental dose-optimization program which includes automated exposure control, adjustment of the mA and/or kV according to patient size and/or use of iterative reconstruction technique. COMPARISON:  Lumbar spine radiographs and  abdominopelvic CT same date. Chest CT 03/18/2019 and abdominal CT 02/08/2016. FINDINGS: Segmentation: Transitional lumbosacral anatomy. In correlation with prior CTs, there are 12 rib-bearing thoracic vertebral bodies and a transitional, nearly fully lumbarized S1 segment. This corresponds with the numbering applied to the earlier radiographs. Alignment: Normal. Vertebrae: There is a mild superior endplate compression fracture at L2 resulting in approximately 15% loss of vertebral body height. This has sharp margins and is likely acute, new from chest CT 03/18/2019. There is no osseous retropulsion. No other acute osseous findings or focal lesions. Paraspinal and other soft tissues: Possible minimal paraspinal edema at L2. No focal fluid  collection. Additional abdominal findings deferred to abdominopelvic CT report. Disc levels: No significant disc space findings from T11-12 through L2-3. L3-4: Fatty filum noted. Mild disc bulging with facet and ligamentous hypertrophy contributing to mild spinal stenosis. No significant foraminal narrowing. L4-5: Mild disc bulging, facet and ligamentous hypertrophy contributing to moderate multifactorial spinal stenosis. No significant foraminal narrowing. L5-S1: Loss of disc height with annular disc bulging and endplate osteophytes. Mild bilateral facet hypertrophy. Mild spinal stenosis with mild lateral recess and foraminal narrowing bilaterally. S1-2: Transitional disc space level. No significant spinal stenosis or nerve root encroachment. IMPRESSION: 1. New mild superior endplate compression fracture at L2, likely acute. No associated osseous retropulsion. 2. Transitional lumbosacral anatomy with largely lumbarized S1 segment. 3. Multilevel spondylosis as described the with resulting multifactorial spinal stenosis, mild at L3-4 and moderate at L4-5. 4. Abdominopelvic CT findings dictated separately. Electronically Signed   By: Richardean Sale M.D.   On: 01/18/2021 14:55     Labs:  CBC: Recent Labs    01/18/21 1256 01/25/21 1614 01/27/21 0523  WBC 10.2 8.9 9.0  HGB 10.8* 10.1* 9.0*  HCT 32.9* 31.0* 27.5*  PLT 241 274 217    COAGS: No results for input(s): INR, APTT in the last 8760 hours.  BMP: Recent Labs    01/18/21 1256 01/25/21 1614 01/26/21 1440 01/27/21 0523  NA 134* 133* 134* 134*  K 5.3* 5.6* 4.9 4.6  CL 103 99 101 102  CO2 21* '22 24 23  ' GLUCOSE 116* 105* 119* 99  BUN 51* 69* 63* 52*  CALCIUM 9.4 9.5 9.1 8.6*  CREATININE 2.29* 2.22* 2.08* 1.90*  GFRNONAA 26* 27* 30* 33*    LIVER FUNCTION TESTS: Recent Labs    01/25/21 1614  BILITOT 0.8  AST 30  ALT 21  ALKPHOS 70  PROT 7.4  ALBUMIN 3.7    TUMOR MARKERS: No results for input(s): AFPTM, CEA, CA199, CHROMGRNA in the last 8760 hours.  Assessment and Plan:  Lumbar 2 compression fracture; acute pain: Dr. Anselm Pancoast met with the patient at the bedside for evaluation and to discuss a possible L2 kyphoplasty/vertebroplasty. The patient endorses significant, sharp/stabbing/shooting pain at the L2 level. Possible treatment options were discussed including conservative management with a back brace/rest/pain medication. The patient stated his pain was too significant to go that route. Risks and benefits of kyphoplasty/vertebroplasty were discussed and the patient is in agreement to proceed. Insurance authorization is currently pending. Once authorization has been obtained IR will notify the team of a date/time and place appropriate procedure orders.   Thank you for this interesting consult.  I greatly enjoyed meeting Jacob [name] and look forward to participating in their care.  A copy of this report was sent to the requesting provider on this date.  Electronically Signed: Soyla Dryer, AGACNP-BC [phone] 01/27/2021, 12:19 PM   I spent a total of 20 Minutes    in face to face in clinical consultation, greater than 50% of which was counseling/coordinating care for L2  kyphoplasty/vertebroplasty

## 2021-01-27 NOTE — Progress Notes (Signed)
Patient with mild L2 compression fracture. No surgery indicated given normal alignment and minimal height loss. Recommend LSO brace for comfort and healing when OOB. Medical pain control can be augmented with potential kyphoplasty, will need IR referral or outpatient referral for this.

## 2021-01-27 NOTE — Progress Notes (Signed)
PROGRESS NOTE    Jacob [name]  IEP:[id number] DOB: Sep 23, 1928 DOA: 01/25/2021 PCP: [name], MD   Chief complaint.  Low back pain. Brief Narrative:  Jacob [name] is a 88 year old male with history of paroxysmal atrial fibrillation, coronary artery disease, history of renal cell carcinoma status post left nephrectomy, essential hypertension, chronic kidney disease stage IV who presented to the hospital with 2 weeks of intractable back pain. CT scan showed L2 compression fracture.   Assessment & Plan:   Principal Problem:   Constipation Active Problems:   CAD (coronary artery disease)   HTN (hypertension)   COPD (chronic obstructive pulmonary disease) (HCC)   Type 2 diabetes mellitus without complication, without long-term current use of insulin (HCC)   Back pain   CKD stage 4 secondary to hypertension (HCC)   Closed compression fracture of L2 lumbar vertebra, sequela   Hyperkalemia  Intractable lower back pain secondary to L2 compression fracture. L2 compression lumbar fracture. Patient still has significant pain in the low back, seen by neurosurgery, recommended kyphoplasty.  Discussed with IR, potentially performed as inpatient.  Patient can be discharged home tomorrow after procedure.  Patient has a bed in SNF.   Hyperkalemia secondary to chronic kidney disease stage IV. Chronic kidney disease stage IV. Hyponatremia. Condition improved after giving Kayexalate.   Constipation. Start senna.  Had a bowel movement yesterday.   Type 2 diabetes.   Continue to follow.   Essential hypertension. Discontinued losartan due to significant hyperkalemia, on Norvasc.       DVT prophylaxis: SCDs Code Status: full Family Communication:  Disposition Plan:      Status is: Observation        I/O last 3 completed shifts: In: 240 [P.O.:240] Out: 600 [Urine:600] No intake/output data recorded.     Consultants:  IR  Procedures:    Antimicrobials:None  Subjective: Patient still complaining significant back pain, worse with any movement. He had a large bowel movement yesterday, no abdominal pain or nausea vomiting. No dysuria hematuria No fever or chills.  Objective: Vitals:   01/26/21 1952 01/27/21 0145 01/27/21 0620 01/27/21 0831  BP:  (!) 135/52 (!) 140/54 (!) 141/49  Pulse: 83 72 70 65  Resp:  16 20 18   Temp:  (!) 97.3 F (36.3 C) 98.3 F (36.8 C) 97.9 F (36.6 C)  TempSrc:      SpO2: 95% 100% 93% 95%  Weight:      Height:        Intake/Output Summary (Last 24 hours) at 01/27/2021 1015 Last data filed at 01/26/2021 2038 Gross per 24 hour  Intake 240 ml  Output 150 ml  Net 90 ml   Filed Weights   01/25/21 1245 01/25/21 1618  Weight: 86.2 kg 86.2 kg    Examination:  General exam: Appears calm and comfortable  Respiratory system: Clear to auscultation. Respiratory effort normal. Cardiovascular system: S1 & S2 heard, RRR. No JVD, murmurs, rubs, gallops or clicks. No pedal edema. Gastrointestinal system: Abdomen is nondistended, soft and nontender. No organomegaly or masses felt. Normal bowel sounds heard. Central nervous system: Alert and oriented. No focal neurological deficits. Extremities: Symmetric 5 x 5 power. Skin: No rashes, lesions or ulcers Psychiatry: Judgement and insight appear normal. Mood & affect appropriate.     Data Reviewed: I have personally reviewed following labs and imaging studies  CBC: Recent Labs  Lab 01/25/21 1614 01/27/21 0523  WBC 8.9 9.0  NEUTROABS 6.9 6.4  HGB 10.1* 9.0*  HCT 31.0* 27.5*  MCV 94.8 92.6  PLT 274 324   Basic Metabolic Panel: Recent Labs  Lab 01/25/21 1614 01/26/21 1440 01/27/21 0523  NA 133* 134* 134*  K 5.6* 4.9 4.6  CL 99 101 102  CO2 22 24 23   GLUCOSE 105* 119* 99  BUN 69* 63* 52*  CREATININE 2.22* 2.08* 1.90*  CALCIUM 9.5 9.1 8.6*   GFR: Estimated Creatinine Clearance: 26.6 mL/min (A) (by C-G formula based on SCr of  1.9 mg/dL (H)). Liver Function Tests: Recent Labs  Lab 01/25/21 1614  AST 30  ALT 21  ALKPHOS 70  BILITOT 0.8  PROT 7.4  ALBUMIN 3.7   No results for input(s): LIPASE, AMYLASE in the last 168 hours. No results for input(s): AMMONIA in the last 168 hours. Coagulation Profile: No results for input(s): INR, PROTIME in the last 168 hours. Cardiac Enzymes: No results for input(s): CKTOTAL, CKMB, CKMBINDEX, TROPONINI in the last 168 hours. BNP (last 3 results) No results for input(s): PROBNP in the last 8760 hours. HbA1C: No results for input(s): HGBA1C in the last 72 hours. CBG: No results for input(s): GLUCAP in the last 168 hours. Lipid Profile: No results for input(s): CHOL, HDL, LDLCALC, TRIG, CHOLHDL, LDLDIRECT in the last 72 hours. Thyroid Function Tests: No results for input(s): TSH, T4TOTAL, FREET4, T3FREE, THYROIDAB in the last 72 hours. Anemia Panel: No results for input(s): VITAMINB12, FOLATE, FERRITIN, TIBC, IRON, RETICCTPCT in the last 72 hours. Sepsis Labs: No results for input(s): PROCALCITON, LATICACIDVEN in the last 168 hours.  Recent Results (from the past 240 hour(s))  Resp Panel by RT-PCR (Flu A&B, Covid) Nasopharyngeal Swab     Status: None   Collection Time: 01/25/21  9:53 PM   Specimen: Nasopharyngeal Swab; Nasopharyngeal(NP) swabs in vial transport medium  Result Value Ref Range Status   SARS Coronavirus 2 by RT PCR NEGATIVE NEGATIVE Final    Comment: (NOTE) SARS-CoV-2 target nucleic acids are NOT DETECTED.  The SARS-CoV-2 RNA is generally detectable in upper respiratory specimens during the acute phase of infection. The lowest concentration of SARS-CoV-2 viral copies this assay can detect is 138 copies/mL. A negative result does not preclude SARS-Cov-2 infection and should not be used as the sole basis for treatment or other patient management decisions. A negative result may occur with  improper specimen collection/handling, submission of specimen  other than nasopharyngeal swab, presence of viral mutation(s) within the areas targeted by this assay, and inadequate number of viral copies(<138 copies/mL). A negative result must be combined with clinical observations, patient history, and epidemiological information. The expected result is Negative.  Fact Sheet for Patients:  EntrepreneurPulse.com.au  Fact Sheet for Healthcare Providers:  IncredibleEmployment.be  This test is no t yet approved or cleared by the [place] FDA and  has been authorized for detection and/or diagnosis of SARS-CoV-2 by FDA under an Emergency Use Authorization (EUA). This EUA will remain  in effect (meaning this test can be used) for the duration of the COVID-19 declaration under Section 564(b)(1) of the Act, 21 U.S.C.section 360bbb-3(b)(1), unless the authorization is terminated  or revoked sooner.       Influenza A by PCR NEGATIVE NEGATIVE Final   Influenza B by PCR NEGATIVE NEGATIVE Final    Comment: (NOTE) The Xpert Xpress SARS-CoV-2/FLU/RSV plus assay is intended as an aid in the diagnosis of influenza from Nasopharyngeal swab specimens and should not be used as a sole basis for treatment. Nasal washings and aspirates are unacceptable for Xpert Xpress SARS-CoV-2/FLU/RSV testing.  Fact  Sheet for Patients: EntrepreneurPulse.com.au  Fact Sheet for Healthcare Providers: IncredibleEmployment.be  This test is not yet approved or cleared by the [place] FDA and has been authorized for detection and/or diagnosis of SARS-CoV-2 by FDA under an Emergency Use Authorization (EUA). This EUA will remain in effect (meaning this test can be used) for the duration of the COVID-19 declaration under Section 564(b)(1) of the Act, 21 U.S.C. section 360bbb-3(b)(1), unless the authorization is terminated or revoked.  Performed at [facility], [address],  [place], [place] [address]          Radiology Studies: CT ABDOMEN PELVIS WO CONTRAST  Result Date: 01/25/2021 CLINICAL DATA:  Abdominal pain, acute, nonlocalized. Constipation over the last 2 weeks. Recent L2 compression fracture. EXAM: CT ABDOMEN AND PELVIS WITHOUT CONTRAST TECHNIQUE: Multidetector CT imaging of the abdomen and pelvis was performed following the standard protocol without IV contrast. RADIATION DOSE REDUCTION: This exam was performed according to the departmental dose-optimization program which includes automated exposure control, adjustment of the mA and/or kV according to patient size and/or use of iterative reconstruction technique. COMPARISON:  01/18/2021 FINDINGS: Lower chest: Chronic scarring at the left lung base. Previous pacemaker placement. Hepatobiliary: Redemonstration of an exophytic liver bump along the anterior margin, unchanged from previous studies. Liver parenchyma is otherwise normal without contrast. No calcified gallstones. Pancreas: Normal Spleen: Small but normal spleen. Adrenals/Urinary Tract: Adrenal glands are normal. The left kidney is surgically absent. 1 mm upper pole renal stone on the right as seen previously. No hydronephrosis. Stomach/Bowel: Stomach and small intestine are unremarkable. Patient does have an increased amount of stool now present throughout the colon consistent with a degree of constipation. There is diverticulosis of the colon without evidence of diverticulitis. Vascular/Lymphatic: Aortic atherosclerosis. Maximal diameter of the infrarenal abdominal aorta is 2.9 cm as seen previously. No retroperitoneal adenopathy. Reproductive: Mildly enlarged prostate. Other: No free fluid or air. Musculoskeletal: Small inguinal hernias containing only fat. Anterior abdominal wall hernia as seen previously containing fat. Mild intrusion of small intestine but without evidence of incarceration or obstruction. Recent L2 compression fracture. Slight further  progression of collapse since the prior study. No retropulsed bone. IMPRESSION: Slight further collapse of the L2 compression fracture compared to the study of 1 week ago. Vertebral augmentation could be considered. Increased amount of fecal matter throughout the colon when compared to the prior study, consistent with a degree of constipation. No other acute or significant finding. Electronically Signed   By: Nelson Chimes M.D.   On: 01/25/2021 16:50   DG Abdomen 1 View  Result Date: 01/25/2021 CLINICAL DATA:  Constipation EXAM: ABDOMEN - 1 VIEW COMPARISON:  None. FINDINGS: Normal bowel gas pattern. Moderate amount of stool in the colon. Small amount of stool in the rectum. No acute skeletal abnormality. Dual lead pacemaker. Electronic device overlying the epigastric region. IMPRESSION: Moderate retained stool in the colon.  Normal bowel gas pattern. Electronically Signed   By: Franchot Gallo M.D.   On: 01/25/2021 13:43        Scheduled Meds:  amLODipine  5 mg Oral Daily   aspirin EC  81 mg Oral Daily   atorvastatin  40 mg Oral Daily   feeding supplement  237 mL Oral BID BM   ferrous sulfate  325 mg Oral Daily   fluticasone furoate-vilanterol  1 puff Inhalation Daily   heparin  5,000 Units Subcutaneous Q8H   isosorbide mononitrate  30 mg Oral QHS   latanoprost  1 drop Both  Eyes QHS   lidocaine  1 patch Transdermal Q24H   mirabegron ER  25 mg Oral Daily   multivitamin with minerals  1 tablet Oral Daily   pantoprazole  40 mg Oral Daily   senna-docusate  2 tablet Oral BID   Continuous Infusions:  lactated ringers       LOS: 0 days    Time spent: 25 minutes    Sharen Hones, MD [facility]   To contact the attending provider between 7A-7P or the covering provider during after hours 7P-7A, please log into the web site [email or web address] and access using universal [place] password for that web site. If you do not have the password, please call the hospital  operator.  01/27/2021, 10:15 AM

## 2021-01-28 DIAGNOSIS — E875 Hyperkalemia: Secondary | ICD-10-CM | POA: Diagnosis not present

## 2021-01-28 DIAGNOSIS — S32020S Wedge compression fracture of second lumbar vertebra, sequela: Secondary | ICD-10-CM | POA: Diagnosis not present

## 2021-01-28 DIAGNOSIS — I129 Hypertensive chronic kidney disease with stage 1 through stage 4 chronic kidney disease, or unspecified chronic kidney disease: Secondary | ICD-10-CM | POA: Diagnosis not present

## 2021-01-28 DIAGNOSIS — N184 Chronic kidney disease, stage 4 (severe): Secondary | ICD-10-CM | POA: Diagnosis not present

## 2021-01-28 LAB — CBC WITH DIFFERENTIAL/PLATELET
Abs Immature Granulocytes: 0.02 10*3/uL (ref 0.00–0.07)
Basophils Absolute: 0.1 10*3/uL (ref 0.0–0.1)
Basophils Relative: 1 %
Eosinophils Absolute: 0.2 10*3/uL (ref 0.0–0.5)
Eosinophils Relative: 2 %
HCT: 26.9 % — ABNORMAL LOW (ref 39.0–52.0)
Hemoglobin: 8.8 g/dL — ABNORMAL LOW (ref 13.0–17.0)
Immature Granulocytes: 0 %
Lymphocytes Relative: 17 %
Lymphs Abs: 1.3 10*3/uL (ref 0.7–4.0)
MCH: 30.7 pg (ref 26.0–34.0)
MCHC: 32.7 g/dL (ref 30.0–36.0)
MCV: 93.7 fL (ref 80.0–100.0)
Monocytes Absolute: 1 10*3/uL (ref 0.1–1.0)
Monocytes Relative: 13 %
Neutro Abs: 5.1 10*3/uL (ref 1.7–7.7)
Neutrophils Relative %: 67 %
Platelets: 208 10*3/uL (ref 150–400)
RBC: 2.87 MIL/uL — ABNORMAL LOW (ref 4.22–5.81)
RDW: 13.8 % (ref 11.5–15.5)
WBC: 7.6 10*3/uL (ref 4.0–10.5)
nRBC: 0 % (ref 0.0–0.2)

## 2021-01-28 LAB — BASIC METABOLIC PANEL
Anion gap: 8 (ref 5–15)
BUN: 53 mg/dL — ABNORMAL HIGH (ref 8–23)
CO2: 25 mmol/L (ref 22–32)
Calcium: 8.5 mg/dL — ABNORMAL LOW (ref 8.9–10.3)
Chloride: 103 mmol/L (ref 98–111)
Creatinine, Ser: 1.93 mg/dL — ABNORMAL HIGH (ref 0.61–1.24)
GFR, Estimated: 32 mL/min — ABNORMAL LOW (ref >60)
Glucose, Bld: 126 mg/dL — ABNORMAL HIGH (ref 70–99)
Potassium: 4.5 mmol/L (ref 3.5–5.1)
Sodium: 136 mmol/L (ref 135–145)

## 2021-01-28 LAB — MAGNESIUM: Magnesium: 2.6 mg/dL — ABNORMAL HIGH (ref 1.7–2.4)

## 2021-01-28 MED ORDER — BOOST / RESOURCE BREEZE PO LIQD CUSTOM
1.0000 | Freq: Three times a day (TID) | ORAL | Status: DC
  Administered 2021-01-29 – 2021-02-01 (×9): 1 via ORAL

## 2021-01-28 MED ORDER — VANCOMYCIN HCL IN DEXTROSE 1-5 GM/200ML-% IV SOLN
1000.0000 mg | INTRAVENOUS | Status: DC
  Filled 2021-01-28 (×2): qty 200

## 2021-01-28 NOTE — Progress Notes (Signed)
Physical Therapy Treatment Patient Details Name: Jacob [name] MRN: [id number] DOB: Mar 27, 1928 Today's Date: 01/28/2021   History of Present Illness 10 M who presented to ED 1/17 with back pain. Pt was found to have L2 compression fracture and was ordered to wear TLSO and follow up with outpatient neurosurgery. Pt was discharged, but then returned to ED 1/24 with lower back pain and constipation x2 weeks. Past medical history significant for GERD, diverticulitis, HTN, HDL, renal cancer, and prostate cancer.    PT Comments    Pt was pleasant and motivated to participate during the session and put forth good effort throughout. Pt required only cuing for sequencing and use of BUEs to assist with coming to standing but no physical assist. Once in standing the pt was able to amb a max of 8 feet with very slow but steady steps with cuing to stay closer to the walker with improved posture. Pt will benefit from PT services in a SNF setting upon discharge to safely address deficits listed in patient problem list for decreased caregiver assistance and eventual return to PLOF.     Recommendations for follow up therapy are one component of a multi-disciplinary discharge planning process, led by the attending physician.  Recommendations may be updated based on patient status, additional functional criteria and insurance authorization.  Follow Up Recommendations  Skilled nursing-short term rehab (<3 hours/day)     Assistance Recommended at Discharge    Patient can return home with the following A little help with walking and/or transfers;A little help with bathing/dressing/bathroom;Assistance with cooking/housework;Assist for transportation;Help with stairs or ramp for entrance   Equipment Recommendations  Rolling walker (2 wheels)    Recommendations for Other Services       Precautions / Restrictions Precautions Precautions: Fall Required Braces or Orthoses: Spinal Brace Spinal Brace:  Thoracolumbosacral orthotic Restrictions Other Position/Activity Restrictions: Pt provided with TLSO following dx of L2 compression fx; MD contacted (via secure) to confirm wearing schedule- to be worn when out of bed.     Mobility  Bed Mobility               General bed mobility comments: NT, pt in recliner    Transfers Overall transfer level: Needs assistance Equipment used: Rolling walker (2 wheels) Transfers: Sit to/from Stand Sit to Stand: Min guard           General transfer comment: Mod verbal cues for hand placement and trunk flexion at hips only    Ambulation/Gait Ambulation/Gait assistance: Min guard Gait Distance (Feet): 8 Feet Assistive device: Rolling walker (2 wheels) Gait Pattern/deviations: Step-through pattern, Decreased step length - right, Decreased step length - left Gait velocity: decreased     General Gait Details: Slow cadence but steady without LOB with min verbal cues for amb closer to the RW with upright posture   Stairs             Wheelchair Mobility    Modified Rankin (Stroke Patients Only)       Balance Overall balance assessment: Needs assistance Sitting-balance support: Feet supported Sitting balance-Leahy Scale: Good     Standing balance support: During functional activity, Bilateral upper extremity supported Standing balance-Leahy Scale: Good                              Cognition Arousal/Alertness: Awake/alert Behavior During Therapy: WFL for tasks assessed/performed Overall Cognitive Status: Within Functional Limits for tasks assessed  Exercises Total Joint Exercises Ankle Circles/Pumps: AROM, Strengthening, Both, 10 reps Quad Sets: 10 reps, Both, Strengthening Gluteal Sets: Strengthening, Both, 10 reps Towel Squeeze: Strengthening, Both, 10 reps Long Arc Quad: Strengthening, Both, 10 reps (with gentle manual resistance) Knee Flexion:  Strengthening, Both, 10 reps (with gentle manual resistance) Marching in Standing: AROM, Strengthening, Both, 5 reps, Standing Other Exercises Other Exercises: HEP education for BLE APs, QS, GS, and LAQs x 10 each every 1-2 hours daily as tolerated    General Comments        Pertinent Vitals/Pain Pain Assessment Pain Assessment: 0-10 Pain Score: 9  Pain Location: lower back Pain Descriptors / Indicators: Sore, Grimacing, Guarding Pain Intervention(s): Repositioned, Premedicated before session, Monitored during session    Home Living                          Prior Function            PT Goals (current goals can now be found in the care plan section) Progress towards PT goals: Progressing toward goals    Frequency    7X/week      PT Plan Current plan remains appropriate    Co-evaluation              AM-PAC PT "6 Clicks" Mobility   Outcome Measure  Help needed turning from your back to your side while in a flat bed without using bedrails?: A Lot Help needed moving from lying on your back to sitting on the side of a flat bed without using bedrails?: A Lot Help needed moving to and from a bed to a chair (including a wheelchair)?: A Little Help needed standing up from a chair using your arms (e.g., wheelchair or bedside chair)?: A Little Help needed to walk in hospital room?: A Little Help needed climbing 3-5 steps with a railing? : A Lot 6 Click Score: 15    End of Session Equipment Utilized During Treatment: Back brace;Gait belt Activity Tolerance: Patient tolerated treatment well Patient left: in chair;with call bell/phone within reach;with chair alarm set;with SCD's reapplied Nurse Communication: Mobility status;Other (comment) (pt reported pain with urination) PT Visit Diagnosis: Muscle weakness (generalized) (M62.81);Unsteadiness on feet (R26.81);Difficulty in walking, not elsewhere classified (R26.2);Pain     Time: 9935-7017 PT Time  Calculation (min) (ACUTE ONLY): 24 min  Charges:  $Gait Training: 8-22 mins $Therapeutic Exercise: 8-22 mins                     D. Scott Lanson Randle PT, DPT 01/28/21, 1:35 PM

## 2021-01-28 NOTE — Progress Notes (Signed)
Insurance authorization for L2 kyphoplasty has been obtained. IR planning procedure for 01/31/21. Patient seen at the bedside today and he is still in agreement to proceed. Consent on the chart, orders placed for Monday (NPO, labs, antibiotic). Please see consult note from 01/27/21 for more information   Risks and benefits of L2  kyphoplasty/vertebroplasty were discussed with the patient including, but not limited to education regarding the natural healing process of compression fractures without intervention, bleeding, infection, cement migration which may cause spinal cord damage, paralysis, pulmonary embolism or even death. This interventional procedure involves the use of X-rays and because of the nature of the planned procedure, it is possible that we will have prolonged use of X-ray fluoroscopy. Potential radiation risks to you include (but are not limited to) the following: - A slightly elevated risk for cancer  several years later in life. This risk is typically less than 0.5% percent. This risk is low in comparison to the normal incidence of human cancer, which is 33% for women and 50% for men according to the [place]. - Radiation induced injury can include skin redness, resembling a rash, tissue breakdown / ulcers and hair loss (which can be temporary or permanent).  The likelihood of either of these occurring depends on the difficulty of the procedure and whether you are sensitive to radiation due to previous procedures, disease, or genetic conditions.   IF your procedure requires a prolonged use of radiation, you will be notified and given written instructions for further action.  It is your responsibility to monitor the irradiated area for the 2 weeks following the procedure and to notify your physician if you are concerned that you have suffered a radiation induced injury.    All of the patient's questions were answered, patient is agreeable to proceed. Consent signed and in  chart.   Please call IR with any questions.  Soyla Dryer, [place] [phone] 01/28/2021, 2:24 PM

## 2021-01-28 NOTE — Progress Notes (Signed)
Nutrition Follow-up  DOCUMENTATION CODES:   Obesity unspecified  INTERVENTION:   -D/c Ensure Enlive -MVI with minerals daily -Boost Breeze po TID, each supplement provides 250 kcal and 9 grams of protein  -Liberalize diet to regular  NUTRITION DIAGNOSIS:   Inadequate oral intake related to acute illness (L2 compression fracture and limited mobility) as evidenced by per patient/family report.  Ongoing  GOAL:   Patient will meet greater than or equal to 90% of their needs  Progressing   MONITOR:   PO intake, Supplement acceptance, Diet advancement, Labs, Weight trends, I & O's  REASON FOR ASSESSMENT:   Malnutrition Screening Tool    ASSESSMENT:   88 yo male with a PMH of GERD, diverticulitis, HTN, HDL, renal cancer, and prostate cancer who  presented to ED 1/17 with back pain. Pt was found to have L2 compression fracture and was ordered to wear TLSO and follow up with outpatient neurosurgery. Pt was discharged, but then returned to ED 1/24 with lower back pain and constipation x2 weeks.  Reviewed I/O's: +486 ml x 24 hours and +126 ml since admission  UOP: 400 ml x 24 hours  Spoke with pt, who was sitting in recliner chair at time of visit. He reports feeling better today and had his last BM two days ago. He was able to get a good night's lseep due to well controlled pain.   Per pt, he is consuming about 25% of of his meals- consumed 25% of salmon and all of banana pudding last night for dinner. Pt prefers Boost Breeze supplements and has been consuming them.    Discussed importance of good meal and supplement intake to promote healing.   Per MD notes, possible plan for kyphoplasty today or Monday (01/31/21).   Medications reviewed and include senokot.   Labs reviewed.   NUTRITION - FOCUSED PHYSICAL EXAM:  Flowsheet Row Most Recent Value  Orbital Region No depletion  Upper Arm Region Mild depletion  Thoracic and Lumbar Region No depletion  Buccal Region No  depletion  Temple Region Mild depletion  Clavicle Bone Region Moderate depletion  Clavicle and Acromion Bone Region Moderate depletion  Scapular Bone Region Moderate depletion  Dorsal Hand Mild depletion  Patellar Region No depletion  Anterior Thigh Region No depletion  Posterior Calf Region No depletion  Edema (RD Assessment) Mild  Hair Reviewed  Eyes Reviewed  Mouth Reviewed  Skin Reviewed  Nails Reviewed       Diet Order:   Diet Order             Diet Heart Room service appropriate? Yes; Fluid consistency: Thin  Diet effective now                   EDUCATION NEEDS:   Education needs have been addressed  Skin:  Skin Assessment: Reviewed RN Assessment  Last BM:  01/26/21  Height:   Ht Readings from Last 1 Encounters:  01/25/21 5\' 6"  (1.676 m)    Weight:   Wt Readings from Last 1 Encounters:  01/25/21 86.2 kg    Ideal Body Weight:  64.5 kg  BMI:  Body mass index is 30.67 kg/m.  Estimated Nutritional Needs:   Kcal:  1800-2000  Protein:  85-100 grams  Fluid:  >1.8 L    Loistine Chance, RD, LDN, [place] Registered Dietitian II Certified Diabetes Care and Education Specialist Please refer to [facility] for RD and/or RD on-call/weekend/after hours pager

## 2021-01-28 NOTE — Progress Notes (Signed)
PROGRESS NOTE    Jacob [name]  RJJ:[id number] DOB: 12/14/1928 DOA: 01/25/2021 PCP: [name], MD    Brief Narrative:  Jacob [name] is a 88 year old male with history of paroxysmal atrial fibrillation, coronary artery disease, history of renal cell carcinoma status post left nephrectomy, essential hypertension, chronic kidney disease stage IV who presented to the hospital with 2 weeks of intractable back pain. CT scan showed L2 compression fracture.   Assessment & Plan:   Principal Problem:   Constipation Active Problems:   CAD (coronary artery disease)   HTN (hypertension)   COPD (chronic obstructive pulmonary disease) (HCC)   Type 2 diabetes mellitus without complication, without long-term current use of insulin (HCC)   Back pain   CKD stage 4 secondary to hypertension (HCC)   Closed compression fracture of L2 lumbar vertebra, sequela   Hyperkalemia     Intractable lower back pain secondary to L2 compression fracture. L2 compression lumbar fracture. Patient still has significant lower back pain, worse with any movement.  Continue pain medicine. Patient is also on TLSO. IR will attempt to perform kyphoplasty today, if not, will be performed on Monday.  As result, I would not be able to discharge patient today.   Hyperkalemia secondary to chronic kidney disease stage IV. Chronic kidney disease stage IV. Hyponatremia. Condition stable, potassium has normalized.   Constipation. Improved.   Type 2 diabetes.   Continue to follow.   Essential hypertension. Discontinued losartan due to significant hyperkalemia, on Norvasc.   DVT prophylaxis: SCds Code Status: Full Family Communication:  Disposition Plan:    Status is: Inpatient  Remains inpatient appropriate because: Severity of disease and the pending procedure.        I/O last 3 completed shifts: In: 886.1 [I.V.:886.1] Out: 550 [Urine:550] No intake/output data recorded.     Consultants:   IR  Procedures: pending  Antimicrobials: None  Subjective: Patient still complains severe pain in the lower back with any movement. No nausea vomiting  No chest pain or short of breath.  Objective: Vitals:   01/27/21 2030 01/27/21 2338 01/28/21 0451 01/28/21 0740  BP: (!) 103/51 (!) 122/57 (!) 137/59 (!) 142/55  Pulse: 68 62 72 74  Resp:  16 16 16   Temp: 98.3 F (36.8 C) 98.4 F (36.9 C) 98.2 F (36.8 C) 97.9 F (36.6 C)  TempSrc:      SpO2: 96% 95% 96% 97%  Weight:      Height:        Intake/Output Summary (Last 24 hours) at 01/28/2021 1104 Last data filed at 01/28/2021 0500 Gross per 24 hour  Intake 886.06 ml  Output 400 ml  Net 486.06 ml   Filed Weights   01/25/21 1245 01/25/21 1618  Weight: 86.2 kg 86.2 kg    Examination:  General exam: Appears calm and comfortable  Respiratory system: Clear to auscultation. Respiratory effort normal. Cardiovascular system: S1 & S2 heard, RRR. No JVD, murmurs, rubs, gallops or clicks. No pedal edema. Gastrointestinal system: Abdomen is nondistended, soft and nontender. No organomegaly or masses felt. Normal bowel sounds heard. Central nervous system: Alert and oriented. No focal neurological deficits. Extremities: Symmetric 5 x 5 power. Skin: No rashes, lesions or ulcers Psychiatry: Judgement and insight appear normal. Mood & affect appropriate.     Data Reviewed: I have personally reviewed following labs and imaging studies  CBC: Recent Labs  Lab 01/25/21 1614 01/27/21 0523 01/28/21 0618  WBC 8.9 9.0 7.6  NEUTROABS 6.9 6.4 5.1  HGB 10.1*  9.0* 8.8*  HCT 31.0* 27.5* 26.9*  MCV 94.8 92.6 93.7  PLT 274 217 749   Basic Metabolic Panel: Recent Labs  Lab 01/25/21 1614 01/26/21 1440 01/27/21 0523 01/28/21 0618  NA 133* 134* 134* 136  K 5.6* 4.9 4.6 4.5  CL 99 101 102 103  CO2 22 24 23 25   GLUCOSE 105* 119* 99 126*  BUN 69* 63* 52* 53*  CREATININE 2.22* 2.08* 1.90* 1.93*  CALCIUM 9.5 9.1 8.6* 8.5*  MG  --    --   --  2.6*   GFR: Estimated Creatinine Clearance: 26.2 mL/min (A) (by C-G formula based on SCr of 1.93 mg/dL (H)). Liver Function Tests: Recent Labs  Lab 01/25/21 1614  AST 30  ALT 21  ALKPHOS 70  BILITOT 0.8  PROT 7.4  ALBUMIN 3.7   No results for input(s): LIPASE, AMYLASE in the last 168 hours. No results for input(s): AMMONIA in the last 168 hours. Coagulation Profile: No results for input(s): INR, PROTIME in the last 168 hours. Cardiac Enzymes: No results for input(s): CKTOTAL, CKMB, CKMBINDEX, TROPONINI in the last 168 hours. BNP (last 3 results) No results for input(s): PROBNP in the last 8760 hours. HbA1C: No results for input(s): HGBA1C in the last 72 hours. CBG: No results for input(s): GLUCAP in the last 168 hours. Lipid Profile: No results for input(s): CHOL, HDL, LDLCALC, TRIG, CHOLHDL, LDLDIRECT in the last 72 hours. Thyroid Function Tests: No results for input(s): TSH, T4TOTAL, FREET4, T3FREE, THYROIDAB in the last 72 hours. Anemia Panel: No results for input(s): VITAMINB12, FOLATE, FERRITIN, TIBC, IRON, RETICCTPCT in the last 72 hours. Sepsis Labs: No results for input(s): PROCALCITON, LATICACIDVEN in the last 168 hours.  Recent Results (from the past 240 hour(s))  Resp Panel by RT-PCR (Flu A&B, Covid) Nasopharyngeal Swab     Status: None   Collection Time: 01/25/21  9:53 PM   Specimen: Nasopharyngeal Swab; Nasopharyngeal(NP) swabs in vial transport medium  Result Value Ref Range Status   SARS Coronavirus 2 by RT PCR NEGATIVE NEGATIVE Final    Comment: (NOTE) SARS-CoV-2 target nucleic acids are NOT DETECTED.  The SARS-CoV-2 RNA is generally detectable in upper respiratory specimens during the acute phase of infection. The lowest concentration of SARS-CoV-2 viral copies this assay can detect is 138 copies/mL. A negative result does not preclude SARS-Cov-2 infection and should not be used as the sole basis for treatment or other patient management  decisions. A negative result may occur with  improper specimen collection/handling, submission of specimen other than nasopharyngeal swab, presence of viral mutation(s) within the areas targeted by this assay, and inadequate number of viral copies(<138 copies/mL). A negative result must be combined with clinical observations, patient history, and epidemiological information. The expected result is Negative.  Fact Sheet for Patients:  EntrepreneurPulse.com.au  Fact Sheet for Healthcare Providers:  IncredibleEmployment.be  This test is no t yet approved or cleared by the [place] FDA and  has been authorized for detection and/or diagnosis of SARS-CoV-2 by FDA under an Emergency Use Authorization (EUA). This EUA will remain  in effect (meaning this test can be used) for the duration of the COVID-19 declaration under Section 564(b)(1) of the Act, 21 U.S.C.section 360bbb-3(b)(1), unless the authorization is terminated  or revoked sooner.       Influenza A by PCR NEGATIVE NEGATIVE Final   Influenza B by PCR NEGATIVE NEGATIVE Final    Comment: (NOTE) The Xpert Xpress SARS-CoV-2/FLU/RSV plus assay is intended as an aid in  the diagnosis of influenza from Nasopharyngeal swab specimens and should not be used as a sole basis for treatment. Nasal washings and aspirates are unacceptable for Xpert Xpress SARS-CoV-2/FLU/RSV testing.  Fact Sheet for Patients: EntrepreneurPulse.com.au  Fact Sheet for Healthcare Providers: IncredibleEmployment.be  This test is not yet approved or cleared by the [place] FDA and has been authorized for detection and/or diagnosis of SARS-CoV-2 by FDA under an Emergency Use Authorization (EUA). This EUA will remain in effect (meaning this test can be used) for the duration of the COVID-19 declaration under Section 564(b)(1) of the Act, 21 U.S.C. section 360bbb-3(b)(1), unless the  authorization is terminated or revoked.  Performed at [facility], [address], [place], [place] [address]          Radiology Studies: No results found.      Scheduled Meds:  amLODipine  5 mg Oral Daily   aspirin EC  81 mg Oral Daily   atorvastatin  40 mg Oral Daily   feeding supplement  237 mL Oral BID BM   ferrous sulfate  325 mg Oral Daily   fluticasone furoate-vilanterol  1 puff Inhalation Daily   isosorbide mononitrate  30 mg Oral QHS   latanoprost  1 drop Both Eyes QHS   lidocaine  1 patch Transdermal Q24H   mirabegron ER  25 mg Oral Daily   multivitamin with minerals  1 tablet Oral Daily   pantoprazole  40 mg Oral Daily   senna-docusate  2 tablet Oral BID   Continuous Infusions:  lactated ringers 50 mL/hr at 01/27/21 1116     LOS: 1 day    Time spent: 27 minutes    Sharen Hones, MD [facility]   To contact the attending provider between 7A-7P or the covering provider during after hours 7P-7A, please log into the web site [email or web address] and access using universal [place] password for that web site. If you do not have the password, please call the hospital operator.  01/28/2021, 11:04 AM

## 2021-01-28 NOTE — Progress Notes (Signed)
OT Cancellation Note  Patient Details Name: Jacob [name] MRN: [id number] DOB: 1928/12/20   Cancelled Treatment:    Reason Eval/Treat Not Completed: Patient declined, no reason specified;Pain limiting ability to participate. Upon attempt, pt seated in recliner with TLSO in place, endorsing 4/10 back pain. Declining ADL, requesting OT to notify RN of pain status. RN notified. Will re-attempt OT tx at later date/time as able.  Ardeth Perfect., MPH, MS, OTR/L ascom [phone] 01/28/21, 1:54 PM

## 2021-01-29 DIAGNOSIS — S32020S Wedge compression fracture of second lumbar vertebra, sequela: Secondary | ICD-10-CM | POA: Diagnosis not present

## 2021-01-29 DIAGNOSIS — I129 Hypertensive chronic kidney disease with stage 1 through stage 4 chronic kidney disease, or unspecified chronic kidney disease: Secondary | ICD-10-CM | POA: Diagnosis not present

## 2021-01-29 DIAGNOSIS — K5900 Constipation, unspecified: Secondary | ICD-10-CM | POA: Diagnosis not present

## 2021-01-29 DIAGNOSIS — N184 Chronic kidney disease, stage 4 (severe): Secondary | ICD-10-CM | POA: Diagnosis not present

## 2021-01-29 LAB — CBC WITH DIFFERENTIAL/PLATELET
Abs Immature Granulocytes: 0.03 10*3/uL (ref 0.00–0.07)
Basophils Absolute: 0.1 10*3/uL (ref 0.0–0.1)
Basophils Relative: 1 %
Eosinophils Absolute: 0.2 10*3/uL (ref 0.0–0.5)
Eosinophils Relative: 3 %
HCT: 28.2 % — ABNORMAL LOW (ref 39.0–52.0)
Hemoglobin: 9.3 g/dL — ABNORMAL LOW (ref 13.0–17.0)
Immature Granulocytes: 0 %
Lymphocytes Relative: 18 %
Lymphs Abs: 1.5 10*3/uL (ref 0.7–4.0)
MCH: 31.3 pg (ref 26.0–34.0)
MCHC: 33 g/dL (ref 30.0–36.0)
MCV: 94.9 fL (ref 80.0–100.0)
Monocytes Absolute: 0.9 10*3/uL (ref 0.1–1.0)
Monocytes Relative: 11 %
Neutro Abs: 5.5 10*3/uL (ref 1.7–7.7)
Neutrophils Relative %: 67 %
Platelets: 203 10*3/uL (ref 150–400)
RBC: 2.97 MIL/uL — ABNORMAL LOW (ref 4.22–5.81)
RDW: 13.6 % (ref 11.5–15.5)
WBC: 8.2 10*3/uL (ref 4.0–10.5)
nRBC: 0 % (ref 0.0–0.2)

## 2021-01-29 LAB — BASIC METABOLIC PANEL
Anion gap: 10 (ref 5–15)
BUN: 49 mg/dL — ABNORMAL HIGH (ref 8–23)
CO2: 26 mmol/L (ref 22–32)
Calcium: 8.9 mg/dL (ref 8.9–10.3)
Chloride: 103 mmol/L (ref 98–111)
Creatinine, Ser: 1.74 mg/dL — ABNORMAL HIGH (ref 0.61–1.24)
GFR, Estimated: 37 mL/min — ABNORMAL LOW (ref >60)
Glucose, Bld: 128 mg/dL — ABNORMAL HIGH (ref 70–99)
Potassium: 4.7 mmol/L (ref 3.5–5.1)
Sodium: 139 mmol/L (ref 135–145)

## 2021-01-29 LAB — MAGNESIUM: Magnesium: 2.5 mg/dL — ABNORMAL HIGH (ref 1.7–2.4)

## 2021-01-29 NOTE — Progress Notes (Signed)
Patient's states pain level 9/10 on pain scale. Pt describes pain as shooting sharp pain and it spasms where it wakes pt out of sleep at times. IV Dilaudid given per PRN order. Pt requested pain pill but when Nurse went back to room to assess, Pt is snoring sound sleep. Will continue to monitor pain throughout the night.

## 2021-01-29 NOTE — Progress Notes (Signed)
PROGRESS NOTE    Jacob [name]  WUJ:[id number] DOB: [birth year] DOA: 01/25/2021 PCP: [name], MD   Chief complaint.  Intractable back pain. Brief Narrative:  [name] is a 88 year old male with history of paroxysmal atrial fibrillation, coronary artery disease, history of renal cell carcinoma status post left nephrectomy, essential hypertension, chronic kidney disease stage IV who presented to the hospital with 2 weeks of intractable back pain. CT scan showed L2 compression fracture.   Assessment & Plan:   Principal Problem:   Constipation Active Problems:   CAD (coronary artery disease)   HTN (hypertension)   COPD (chronic obstructive pulmonary disease) (HCC)   Type 2 diabetes mellitus without complication, without long-term current use of insulin (HCC)   Back pain   CKD stage 4 secondary to hypertension (HCC)   Closed compression fracture of L2 lumbar vertebra, sequela   Hyperkalemia   Intractable lower back pain secondary to L2 compression fracture. L2 compression lumbar fracture. Patient is on TLSO. Still has significant lower back pain, not able to discharge due to poor pain control. Patient is scheduled for kyphoplasty on Monday.  Most likely will discharge afterwards.   Hyperkalemia secondary to chronic kidney disease stage IV. Chronic kidney disease stage IV. Hyponatremia. Condition has been stabilized.   Constipation. Improved.   Type 2 diabetes.   Continue to follow.   Essential hypertension. Discontinued losartan due to significant hyperkalemia, on Norvasc.     DVT prophylaxis: SCds Code Status: Full Family Communication:  Disposition Plan:      Status is: Inpatient   Remains inpatient appropriate because: Severity of disease and the pending procedure.        I/O last 3 completed shifts: In: 1696.1 [P.O.:610; I.V.:886.1; IV Piggyback:200] Out: 325 [Urine:325] No intake/output data recorded.     Consultants:   IR  Procedures: None  Antimicrobials: None  Subjective: Patient still complaining significant lower back pain, worse with minimal movement. No incontinence of urine or bowel. No short of breath or cough.   Objective: Vitals:   01/28/21 1525 01/28/21 2015 01/29/21 0535 01/29/21 0740  BP: (!) 127/52 (!) 119/57 (!) 135/57 (!) 128/54  Pulse: 65 63 64 69  Resp: 16 16 20 15   Temp: 98.1 F (36.7 C) 98.3 F (36.8 C) 98.5 F (36.9 C) 98.3 F (36.8 C)  TempSrc:   Oral   SpO2: 99% 100% 98% 96%  Weight:      Height:        Intake/Output Summary (Last 24 hours) at 01/29/2021 1057 Last data filed at 01/29/2021 0550 Gross per 24 hour  Intake 360 ml  Output 75 ml  Net 285 ml   Filed Weights   01/25/21 1245 01/25/21 1618  Weight: 86.2 kg 86.2 kg    Examination:  General exam: Appears calm and comfortable  Respiratory system: Clear to auscultation. Respiratory effort normal. Cardiovascular system: S1 & S2 heard, RRR. No JVD, murmurs, rubs, gallops or clicks. No pedal edema. Gastrointestinal system: Abdomen is nondistended, soft and nontender. No organomegaly or masses felt. Normal bowel sounds heard. Central nervous system: Alert and oriented. No focal neurological deficits. Extremities: Symmetric 5 x 5 power. Skin: No rashes, lesions or ulcers Psychiatry: Judgement and insight appear normal. Mood & affect appropriate.     Data Reviewed: I have personally reviewed following labs and imaging studies  CBC: Recent Labs  Lab 01/25/21 1614 01/27/21 0523 01/28/21 0618 01/29/21 0459  WBC 8.9 9.0 7.6 8.2  NEUTROABS 6.9 6.4 5.1 5.5  HGB  10.1* 9.0* 8.8* 9.3*  HCT 31.0* 27.5* 26.9* 28.2*  MCV 94.8 92.6 93.7 94.9  PLT 274 217 208 335   Basic Metabolic Panel: Recent Labs  Lab 01/25/21 1614 01/26/21 1440 01/27/21 0523 01/28/21 0618 01/29/21 0459  NA 133* 134* 134* 136 139  K 5.6* 4.9 4.6 4.5 4.7  CL 99 101 102 103 103  CO2 22 24 23 25 26   GLUCOSE 105* 119* 99 126*  128*  BUN 69* 63* 52* 53* 49*  CREATININE 2.22* 2.08* 1.90* 1.93* 1.74*  CALCIUM 9.5 9.1 8.6* 8.5* 8.9  MG  --   --   --  2.6* 2.5*   GFR: Estimated Creatinine Clearance: 29.1 mL/min (A) (by C-G formula based on SCr of 1.74 mg/dL (H)). Liver Function Tests: Recent Labs  Lab 01/25/21 1614  AST 30  ALT 21  ALKPHOS 70  BILITOT 0.8  PROT 7.4  ALBUMIN 3.7   No results for input(s): LIPASE, AMYLASE in the last 168 hours. No results for input(s): AMMONIA in the last 168 hours. Coagulation Profile: No results for input(s): INR, PROTIME in the last 168 hours. Cardiac Enzymes: No results for input(s): CKTOTAL, CKMB, CKMBINDEX, TROPONINI in the last 168 hours. BNP (last 3 results) No results for input(s): PROBNP in the last 8760 hours. HbA1C: No results for input(s): HGBA1C in the last 72 hours. CBG: No results for input(s): GLUCAP in the last 168 hours. Lipid Profile: No results for input(s): CHOL, HDL, LDLCALC, TRIG, CHOLHDL, LDLDIRECT in the last 72 hours. Thyroid Function Tests: No results for input(s): TSH, T4TOTAL, FREET4, T3FREE, THYROIDAB in the last 72 hours. Anemia Panel: No results for input(s): VITAMINB12, FOLATE, FERRITIN, TIBC, IRON, RETICCTPCT in the last 72 hours. Sepsis Labs: No results for input(s): PROCALCITON, LATICACIDVEN in the last 168 hours.  Recent Results (from the past 240 hour(s))  Resp Panel by RT-PCR (Flu A&B, Covid) Nasopharyngeal Swab     Status: None   Collection Time: 01/25/21  9:53 PM   Specimen: Nasopharyngeal Swab; Nasopharyngeal(NP) swabs in vial transport medium  Result Value Ref Range Status   SARS Coronavirus 2 by RT PCR NEGATIVE NEGATIVE Final    Comment: (NOTE) SARS-CoV-2 target nucleic acids are NOT DETECTED.  The SARS-CoV-2 RNA is generally detectable in upper respiratory specimens during the acute phase of infection. The lowest concentration of SARS-CoV-2 viral copies this assay can detect is 138 copies/mL. A negative result does  not preclude SARS-Cov-2 infection and should not be used as the sole basis for treatment or other patient management decisions. A negative result may occur with  improper specimen collection/handling, submission of specimen other than nasopharyngeal swab, presence of viral mutation(s) within the areas targeted by this assay, and inadequate number of viral copies(<138 copies/mL). A negative result must be combined with clinical observations, patient history, and epidemiological information. The expected result is Negative.  Fact Sheet for Patients:  EntrepreneurPulse.com.au  Fact Sheet for Healthcare Providers:  IncredibleEmployment.be  This test is no t yet approved or cleared by the [place] FDA and  has been authorized for detection and/or diagnosis of SARS-CoV-2 by FDA under an Emergency Use Authorization (EUA). This EUA will remain  in effect (meaning this test can be used) for the duration of the COVID-19 declaration under Section 564(b)(1) of the Act, 21 U.S.C.section 360bbb-3(b)(1), unless the authorization is terminated  or revoked sooner.       Influenza A by PCR NEGATIVE NEGATIVE Final   Influenza B by PCR NEGATIVE NEGATIVE Final  Comment: (NOTE) The Xpert Xpress SARS-CoV-2/FLU/RSV plus assay is intended as an aid in the diagnosis of influenza from Nasopharyngeal swab specimens and should not be used as a sole basis for treatment. Nasal washings and aspirates are unacceptable for Xpert Xpress SARS-CoV-2/FLU/RSV testing.  Fact Sheet for Patients: EntrepreneurPulse.com.au  Fact Sheet for Healthcare Providers: IncredibleEmployment.be  This test is not yet approved or cleared by the [place] FDA and has been authorized for detection and/or diagnosis of SARS-CoV-2 by FDA under an Emergency Use Authorization (EUA). This EUA will remain in effect (meaning this test can be used) for the  duration of the COVID-19 declaration under Section 564(b)(1) of the Act, 21 U.S.C. section 360bbb-3(b)(1), unless the authorization is terminated or revoked.  Performed at [facility], [address], [place], [place] [address]          Radiology Studies: No results found.      Scheduled Meds:  amLODipine  5 mg Oral Daily   aspirin EC  81 mg Oral Daily   atorvastatin  40 mg Oral Daily   feeding supplement  1 Container Oral TID BM   ferrous sulfate  325 mg Oral Daily   fluticasone furoate-vilanterol  1 puff Inhalation Daily   isosorbide mononitrate  30 mg Oral QHS   latanoprost  1 drop Both Eyes QHS   lidocaine  1 patch Transdermal Q24H   mirabegron ER  25 mg Oral Daily   multivitamin with minerals  1 tablet Oral Daily   pantoprazole  40 mg Oral Daily   senna-docusate  2 tablet Oral BID   Continuous Infusions:  lactated ringers Stopped (01/28/21 0730)   [START ON 01/31/2021] vancomycin       LOS: 2 days    Time spent: 25 minutes    Sharen Hones, MD [facility]   To contact the attending provider between 7A-7P or the covering provider during after hours 7P-7A, please log into the web site [email or web address] and access using universal [place] password for that web site. If you do not have the password, please call the hospital operator.  01/29/2021, 10:57 AM

## 2021-01-29 NOTE — Progress Notes (Signed)
Physical Therapy Treatment Patient Details Name: Jacob [name] MRN: [id number] DOB: 16-Apr-1928 Today's Date: 01/29/2021   History of Present Illness 39 M who presented to ED 1/17 with back pain. Pt was found to have L2 compression fracture and was ordered to wear TLSO and follow up with outpatient neurosurgery. Pt was discharged, but then returned to ED 1/24 with lower back pain and constipation x2 weeks. Past medical history significant for GERD, diverticulitis, HTN, HDL, renal cancer, and prostate cancer.    PT Comments    Pt was supine in bed with TLSO on however pt reporting " Its not on right." Author educated pt on not needing to wear in bed endless he is more comfortable wearing it.  He is to wear it when OOB and in all standing activity (per orders). Author adjusted brace to proper fit in short sitting. He would benefit from less invasive brace (like LSO) for easy of application and removal at DC. Currently scheduled for procedure on Monday. Pt required mod assist to exit bed, more so due to pain than strength. Spasm present during movements. He stood to RW and easily ambulated short distance to recliner. He requested not to ambulate further distances due to pain. Author assisted pt with denture cleaning and ordering breakfast. Overall he is doing well. Will assess pt's mobility post procedure for appropriate DC recommendation. Currently recommending SNF due to pt's inability to apply/remove brace I'ly and pt living home alone. Does have son that lives close by. RN in room during session and is aware of pt's abilities. Pt elected to keep TLSO on in chair," It feels better now that it has been adjusted." Acute PT will continue to follow up until DC.     Recommendations for follow up therapy are one component of a multi-disciplinary discharge planning process, led by the attending physician.  Recommendations may be updated based on patient status, additional functional criteria and insurance  authorization.  Follow Up Recommendations  Skilled nursing-short term rehab (<3 hours/day) (pt lives alone however may be able to DC home if doing well after procedure on 1/30. Will continue to assess going forward.)     Assistance Recommended at Discharge Intermittent Supervision/Assistance  Patient can return home with the following A little help with walking and/or transfers;A little help with bathing/dressing/bathroom;Assistance with cooking/housework;Assist for transportation;Help with stairs or ramp for entrance   Equipment Recommendations  Rolling walker (2 wheels)       Precautions / Restrictions Precautions Precautions: Fall Required Braces or Orthoses: Spinal Brace Spinal Brace: Thoracolumbosacral orthotic (switch to LSO for pt's benefit with proper donning/doffing?) Restrictions Weight Bearing Restrictions: No     Mobility  Bed Mobility Overal bed mobility: Needs Assistance Bed Mobility: Rolling, Sidelying to Sit Rolling: Min assist Sidelying to sit: Mod assist       General bed mobility comments: Pt was able to exit bed with mod assist once in side lying more so due to pain than strength    Transfers Overall transfer level: Needs assistance Equipment used: Rolling walker (2 wheels) Transfers: Sit to/from Stand Sit to Stand: Min guard           General transfer comment: Pt was able to stand to RW(while wearing TLSO brace) without physical assistance. bed height was minimally elevated    Ambulation/Gait Ambulation/Gait assistance: Min guard Gait Distance (Feet): 5 Feet Assistive device: Rolling walker (2 wheels) Gait Pattern/deviations: Antalgic, Step-to pattern Gait velocity: decreased     General Gait Details: pain/spasm limiting more  so than physical limitations    Balance Overall balance assessment: Needs assistance Sitting-balance support: Feet supported Sitting balance-Leahy Scale: Good     Standing balance support: During functional  activity, Bilateral upper extremity supported Standing balance-Leahy Scale: Good      Cognition Arousal/Alertness: Awake/alert Behavior During Therapy: WFL for tasks assessed/performed Overall Cognitive Status: Within Functional Limits for tasks assessed      General Comments: Pt is A and O x 4               Pertinent Vitals/Pain Pain Assessment Pain Assessment: 0-10 Pain Score: 7  Pain Location: lower back Pain Descriptors / Indicators: Sore, Grimacing, Guarding Pain Intervention(s): Limited activity within patient's tolerance, Monitored during session, Premedicated before session, Repositioned     PT Goals (current goals can now be found in the care plan section) Acute Rehab PT Goals Patient Stated Goal: have less pain and spasms Progress towards PT goals: Progressing toward goals    Frequency    7X/week      PT Plan Current plan remains appropriate       AM-PAC PT "6 Clicks" Mobility   Outcome Measure  Help needed turning from your back to your side while in a flat bed without using bedrails?: A Little Help needed moving from lying on your back to sitting on the side of a flat bed without using bedrails?: A Lot Help needed moving to and from a bed to a chair (including a wheelchair)?: A Little Help needed standing up from a chair using your arms (e.g., wheelchair or bedside chair)?: A Little Help needed to walk in hospital room?: A Little Help needed climbing 3-5 steps with a railing? : A Little 6 Click Score: 17    End of Session Equipment Utilized During Treatment: Back brace Activity Tolerance: Patient tolerated treatment well Patient left: in chair;with call bell/phone within reach;with chair alarm set;with SCD's reapplied Nurse Communication: Mobility status;Other (comment) PT Visit Diagnosis: Muscle weakness (generalized) (M62.81);Unsteadiness on feet (R26.81);Difficulty in walking, not elsewhere classified (R26.2);Pain     Time: 0755-0820 PT  Time Calculation (min) (ACUTE ONLY): 25 min  Charges:  $Therapeutic Activity: 23-37 mins                     Julaine Fusi PTA 01/29/21, 9:22 AM

## 2021-01-30 DIAGNOSIS — N178 Other acute kidney failure: Secondary | ICD-10-CM

## 2021-01-30 DIAGNOSIS — N184 Chronic kidney disease, stage 4 (severe): Secondary | ICD-10-CM

## 2021-01-30 DIAGNOSIS — S32020S Wedge compression fracture of second lumbar vertebra, sequela: Secondary | ICD-10-CM | POA: Diagnosis not present

## 2021-01-30 DIAGNOSIS — K5900 Constipation, unspecified: Secondary | ICD-10-CM | POA: Diagnosis not present

## 2021-01-30 DIAGNOSIS — E875 Hyperkalemia: Secondary | ICD-10-CM | POA: Diagnosis not present

## 2021-01-30 DIAGNOSIS — N179 Acute kidney failure, unspecified: Secondary | ICD-10-CM

## 2021-01-30 MED ORDER — LACTULOSE 10 GM/15ML PO SOLN
20.0000 g | Freq: Once | ORAL | Status: AC
  Administered 2021-01-30: 20 g via ORAL
  Filled 2021-01-30: qty 30

## 2021-01-30 NOTE — Progress Notes (Signed)
Physical Therapy Treatment Patient Details Name: ERSEL ENSLIN MRN: [id number] DOB: [birth year] Today's Date: 01/30/2021   History of Present Illness 6 M who presented to ED 1/17 with back pain. Pt was found to have L2 compression fracture and was ordered to wear TLSO and follow up with outpatient neurosurgery. Pt was discharged, but then returned to ED 1/24 with lower back pain and constipation x2 weeks. Past medical history significant for GERD, diverticulitis, HTN, HDL, renal cancer, and prostate cancer.    PT Comments    Pt ready to get up to eat.  Log rolling to sitting with min a x 1 and cues.  Steady in sitting.  Donned brace but pt asks and refuses to use front stability piece of brace.  Explained reasons for bar to prevent forward flexion but despite education and encouragement he refuses to use but does don the rest of the brace "It doesn't do anything anyways"  He is cued to limit forward flexion with transfers as he wants to sit in the chair to eat.  He does stand at chairside before sitting for breakfast but gait is held by Probation officer as brace is not as ordered.  Kyphoplasty planned for tomorrow.   Recommendations for follow up therapy are one component of a multi-disciplinary discharge planning process, led by the attending physician.  Recommendations may be updated based on patient status, additional functional criteria and insurance authorization.  Follow Up Recommendations  Skilled nursing-short term rehab (<3 hours/day) (pt lives alone however may be able to DC home if doing well after procedure on 1/30. Will continue to assess going forward.)     Assistance Recommended at Discharge Intermittent Supervision/Assistance  Patient can return home with the following A little help with walking and/or transfers;A little help with bathing/dressing/bathroom;Assistance with cooking/housework;Assist for transportation;Help with stairs or ramp for entrance   Equipment Recommendations   Rolling walker (2 wheels)    Recommendations for Other Services       Precautions / Restrictions Precautions Precautions: Fall Required Braces or Orthoses: Spinal Brace Spinal Brace: Thoracolumbosacral orthotic (switch to LSO for pt's benefit with proper donning/doffing?) Restrictions Weight Bearing Restrictions: No Other Position/Activity Restrictions: Pt refusing to use front stability piec eon brace.  "It don't want it"  educated but he continues to refuse but asks to get OOB to eat.     Mobility  Bed Mobility Overal bed mobility: Needs Assistance Bed Mobility: Rolling, Sidelying to Sit Rolling: Min assist Sidelying to sit: Min assist       General bed mobility comments: cues for log rolling    Transfers Overall transfer level: Needs assistance Equipment used: Rolling walker (2 wheels) Transfers: Sit to/from Stand Sit to Stand: Min guard, Min assist   Step pivot transfers: Min guard            Ambulation/Gait Ambulation/Gait assistance: Min guard Gait Distance (Feet): 3 Feet Assistive device: Rolling walker (2 wheels) Gait Pattern/deviations: Antalgic, Step-to pattern Gait velocity: decreased         Stairs             Wheelchair Mobility    Modified Rankin (Stroke Patients Only)       Balance Overall balance assessment: Needs assistance Sitting-balance support: Feet supported Sitting balance-Leahy Scale: Good     Standing balance support: During functional activity, Bilateral upper extremity supported Standing balance-Leahy Scale: Good  Cognition Arousal/Alertness: Awake/alert Behavior During Therapy: WFL for tasks assessed/performed Overall Cognitive Status: Within Functional Limits for tasks assessed                                 General Comments: Pt is A and O x 4        Exercises      General Comments        Pertinent Vitals/Pain Pain Assessment Pain Score: 8   Pain Location: lower back Pain Descriptors / Indicators: Sore, Grimacing, Guarding Pain Intervention(s): Limited activity within patient's tolerance, Monitored during session, Repositioned    Home Living                          Prior Function            PT Goals (current goals can now be found in the care plan section) Progress towards PT goals: Progressing toward goals    Frequency    7X/week      PT Plan Current plan remains appropriate    Co-evaluation              AM-PAC PT "6 Clicks" Mobility   Outcome Measure  Help needed turning from your back to your side while in a flat bed without using bedrails?: A Little Help needed moving from lying on your back to sitting on the side of a flat bed without using bedrails?: A Lot Help needed moving to and from a bed to a chair (including a wheelchair)?: A Little Help needed standing up from a chair using your arms (e.g., wheelchair or bedside chair)?: A Little Help needed to walk in hospital room?: A Little Help needed climbing 3-5 steps with a railing? : A Little 6 Click Score: 17    End of Session Equipment Utilized During Treatment: Back brace Activity Tolerance: Patient tolerated treatment well Patient left: in chair;with call bell/phone within reach;with chair alarm set;with SCD's reapplied Nurse Communication: Mobility status;Other (comment) PT Visit Diagnosis: Muscle weakness (generalized) (M62.81);Unsteadiness on feet (R26.81);Difficulty in walking, not elsewhere classified (R26.2);Pain     Time: 1030-1040 PT Time Calculation (min) (ACUTE ONLY): 10 min  Charges:  $Therapeutic Activity: 8-22 mins                    Chesley Noon, PTA 01/30/21, 10:56 AM

## 2021-01-30 NOTE — Progress Notes (Signed)
PROGRESS NOTE    Jacob [name]  EVO:[id number] DOB: [birth year] DOA: 01/25/2021 PCP: [name], MD    Brief Narrative:  Jacob [name] is a 88 year old male with history of paroxysmal atrial fibrillation, coronary artery disease, history of renal cell carcinoma status post left nephrectomy, essential hypertension, chronic kidney disease stage IV who presented to the hospital with 2 weeks of intractable back pain. CT scan showed L2 compression fracture.     Assessment & Plan:   Principal Problem:   Constipation Active Problems:   CAD (coronary artery disease)   HTN (hypertension)   COPD (chronic obstructive pulmonary disease) (HCC)   Type 2 diabetes mellitus without complication, without long-term current use of insulin (HCC)   Back pain   CKD stage 4 secondary to hypertension (HCC)   Closed compression fracture of L2 lumbar vertebra, sequela   Hyperkalemia  Intractable lower back pain secondary to L2 compression fracture. L2 compression lumbar fracture. Patient is on TLSO. Still has significant back pain with any movement.  Planning for kyphoplasty tomorrow.  Continue pain medicine, most likely will discharge to nursing home tomorrow after procedure.   Hyperkalemia secondary to chronic kidney disease stage IV. Acute on Chronic kidney disease stage IV. Ruled In. Hyponatremia. Patient renal function was elevated with creatinine of 2.29, now back to 1.72.  Patient had acute kidney injury on chronic kidney disease. Condition has back to baseline.  Potassium has normalized.   Constipation. Will give another dose of lactulose.   Type 2 diabetes.   Continue to follow.   Essential hypertension. Discontinued losartan due to significant hyperkalemia, on Norvasc.     DVT prophylaxis: SCds Code Status: Full Family Communication:  Disposition Plan:      Status is: Inpatient   Remains inpatient appropriate because: Severity of disease and the pending  procedure.      I/O last 3 completed shifts: In: 600 [P.O.:600] Out: 425 [Urine:425] No intake/output data recorded.     Consultants:  IR  Procedures: Pending  Antimicrobials: None  Subjective: Patient still has significant lower back pain with any movement.  Pain is better at rest. No fever chills pain No abdominal pain or nausea vomiting.  Last bowel movement was 1/25.  Objective: Vitals:   01/29/21 1948 01/30/21 0016 01/30/21 0423 01/30/21 1013  BP: (!) 137/59 138/60 (!) 137/51 (!) 130/45  Pulse: 76 70 63 (!) 57  Resp: 19 18 17 15   Temp: 99.5 F (37.5 C) 98.3 F (36.8 C) 98.9 F (37.2 C) 98.1 F (36.7 C)  TempSrc:      SpO2: 93% 96% 95% 94%  Weight:      Height:        Intake/Output Summary (Last 24 hours) at 01/30/2021 1221 Last data filed at 01/30/2021 0002 Gross per 24 hour  Intake 240 ml  Output 350 ml  Net -110 ml   Filed Weights   01/25/21 1245 01/25/21 1618  Weight: 86.2 kg 86.2 kg    Examination:  General exam: Appears calm and comfortable  Respiratory system: Clear to auscultation. Respiratory effort normal. Cardiovascular system: S1 & S2 heard, RRR. No JVD, murmurs, rubs, gallops or clicks. No pedal edema. Gastrointestinal system: Abdomen is nondistended, soft and nontender. No organomegaly or masses felt. Normal bowel sounds heard. Central nervous system: Alert and oriented. No focal neurological deficits. Extremities: Symmetric 5 x 5 power. Skin: No rashes, lesions or ulcers Psychiatry: Judgement and insight appear normal. Mood & affect appropriate.     Data Reviewed: I have  personally reviewed following labs and imaging studies  CBC: Recent Labs  Lab 01/25/21 1614 01/27/21 0523 01/28/21 0618 01/29/21 0459  WBC 8.9 9.0 7.6 8.2  NEUTROABS 6.9 6.4 5.1 5.5  HGB 10.1* 9.0* 8.8* 9.3*  HCT 31.0* 27.5* 26.9* 28.2*  MCV 94.8 92.6 93.7 94.9  PLT 274 217 208 017   Basic Metabolic Panel: Recent Labs  Lab 01/25/21 1614  01/26/21 1440 01/27/21 0523 01/28/21 0618 01/29/21 0459  NA 133* 134* 134* 136 139  K 5.6* 4.9 4.6 4.5 4.7  CL 99 101 102 103 103  CO2 22 24 23 25 26   GLUCOSE 105* 119* 99 126* 128*  BUN 69* 63* 52* 53* 49*  CREATININE 2.22* 2.08* 1.90* 1.93* 1.74*  CALCIUM 9.5 9.1 8.6* 8.5* 8.9  MG  --   --   --  2.6* 2.5*   GFR: Estimated Creatinine Clearance: 29.1 mL/min (A) (by C-G formula based on SCr of 1.74 mg/dL (H)). Liver Function Tests: Recent Labs  Lab 01/25/21 1614  AST 30  ALT 21  ALKPHOS 70  BILITOT 0.8  PROT 7.4  ALBUMIN 3.7   No results for input(s): LIPASE, AMYLASE in the last 168 hours. No results for input(s): AMMONIA in the last 168 hours. Coagulation Profile: No results for input(s): INR, PROTIME in the last 168 hours. Cardiac Enzymes: No results for input(s): CKTOTAL, CKMB, CKMBINDEX, TROPONINI in the last 168 hours. BNP (last 3 results) No results for input(s): PROBNP in the last 8760 hours. HbA1C: No results for input(s): HGBA1C in the last 72 hours. CBG: No results for input(s): GLUCAP in the last 168 hours. Lipid Profile: No results for input(s): CHOL, HDL, LDLCALC, TRIG, CHOLHDL, LDLDIRECT in the last 72 hours. Thyroid Function Tests: No results for input(s): TSH, T4TOTAL, FREET4, T3FREE, THYROIDAB in the last 72 hours. Anemia Panel: No results for input(s): VITAMINB12, FOLATE, FERRITIN, TIBC, IRON, RETICCTPCT in the last 72 hours. Sepsis Labs: No results for input(s): PROCALCITON, LATICACIDVEN in the last 168 hours.  Recent Results (from the past 240 hour(s))  Resp Panel by RT-PCR (Flu A&B, Covid) Nasopharyngeal Swab     Status: None   Collection Time: 01/25/21  9:53 PM   Specimen: Nasopharyngeal Swab; Nasopharyngeal(NP) swabs in vial transport medium  Result Value Ref Range Status   SARS Coronavirus 2 by RT PCR NEGATIVE NEGATIVE Final    Comment: (NOTE) SARS-CoV-2 target nucleic acids are NOT DETECTED.  The SARS-CoV-2 RNA is generally detectable  in upper respiratory specimens during the acute phase of infection. The lowest concentration of SARS-CoV-2 viral copies this assay can detect is 138 copies/mL. A negative result does not preclude SARS-Cov-2 infection and should not be used as the sole basis for treatment or other patient management decisions. A negative result may occur with  improper specimen collection/handling, submission of specimen other than nasopharyngeal swab, presence of viral mutation(s) within the areas targeted by this assay, and inadequate number of viral copies(<138 copies/mL). A negative result must be combined with clinical observations, patient history, and epidemiological information. The expected result is Negative.  Fact Sheet for Patients:  EntrepreneurPulse.com.au  Fact Sheet for Healthcare Providers:  IncredibleEmployment.be  This test is no t yet approved or cleared by the [place] FDA and  has been authorized for detection and/or diagnosis of SARS-CoV-2 by FDA under an Emergency Use Authorization (EUA). This EUA will remain  in effect (meaning this test can be used) for the duration of the COVID-19 declaration under Section 564(b)(1) of the Act,  21 U.S.C.section 360bbb-3(b)(1), unless the authorization is terminated  or revoked sooner.       Influenza A by PCR NEGATIVE NEGATIVE Final   Influenza B by PCR NEGATIVE NEGATIVE Final    Comment: (NOTE) The Xpert Xpress SARS-CoV-2/FLU/RSV plus assay is intended as an aid in the diagnosis of influenza from Nasopharyngeal swab specimens and should not be used as a sole basis for treatment. Nasal washings and aspirates are unacceptable for Xpert Xpress SARS-CoV-2/FLU/RSV testing.  Fact Sheet for Patients: EntrepreneurPulse.com.au  Fact Sheet for Healthcare Providers: IncredibleEmployment.be  This test is not yet approved or cleared by the [place] FDA and has  been authorized for detection and/or diagnosis of SARS-CoV-2 by FDA under an Emergency Use Authorization (EUA). This EUA will remain in effect (meaning this test can be used) for the duration of the COVID-19 declaration under Section 564(b)(1) of the Act, 21 U.S.C. section 360bbb-3(b)(1), unless the authorization is terminated or revoked.  Performed at [facility], [address] [place], [place] [address]          Radiology Studies: No results found.      Scheduled Meds:  amLODipine  5 mg Oral Daily   aspirin EC  81 mg Oral Daily   atorvastatin  40 mg Oral Daily   feeding supplement  1 Container Oral TID BM   ferrous sulfate  325 mg Oral Daily   fluticasone furoate-vilanterol  1 puff Inhalation Daily   isosorbide mononitrate  30 mg Oral QHS   lactulose  20 g Oral Once   latanoprost  1 drop Both Eyes QHS   lidocaine  1 patch Transdermal Q24H   mirabegron ER  25 mg Oral Daily   multivitamin with minerals  1 tablet Oral Daily   pantoprazole  40 mg Oral Daily   senna-docusate  2 tablet Oral BID   Continuous Infusions:  lactated ringers Stopped (01/28/21 0730)   [START ON 01/31/2021] vancomycin       LOS: 3 days    Time spent: 27 minutes    Sharen Hones, MD [facility]   To contact the attending provider between 7A-7P or the covering provider during after hours 7P-7A, please log into the web site [email or web address] and access using universal [place] password for that web site. If you do not have the password, please call the hospital operator.  01/30/2021, 12:21 PM

## 2021-01-31 ENCOUNTER — Inpatient Hospital Stay: Payer: Medicare Other | Admitting: Radiology

## 2021-01-31 DIAGNOSIS — E875 Hyperkalemia: Secondary | ICD-10-CM | POA: Diagnosis not present

## 2021-01-31 DIAGNOSIS — N178 Other acute kidney failure: Secondary | ICD-10-CM | POA: Diagnosis not present

## 2021-01-31 DIAGNOSIS — S32020S Wedge compression fracture of second lumbar vertebra, sequela: Secondary | ICD-10-CM | POA: Diagnosis not present

## 2021-01-31 DIAGNOSIS — N184 Chronic kidney disease, stage 4 (severe): Secondary | ICD-10-CM | POA: Diagnosis not present

## 2021-01-31 HISTORY — PX: IR KYPHO LUMBAR INC FX REDUCE BONE BX UNI/BIL CANNULATION INC/IMAGING: IMG5519

## 2021-01-31 LAB — BASIC METABOLIC PANEL
Anion gap: 8 (ref 5–15)
BUN: 55 mg/dL — ABNORMAL HIGH (ref 8–23)
CO2: 27 mmol/L (ref 22–32)
Calcium: 9 mg/dL (ref 8.9–10.3)
Chloride: 105 mmol/L (ref 98–111)
Creatinine, Ser: 1.77 mg/dL — ABNORMAL HIGH (ref 0.61–1.24)
GFR, Estimated: 36 mL/min — ABNORMAL LOW (ref >60)
Glucose, Bld: 130 mg/dL — ABNORMAL HIGH (ref 70–99)
Potassium: 4.4 mmol/L (ref 3.5–5.1)
Sodium: 140 mmol/L (ref 135–145)

## 2021-01-31 LAB — PROTIME-INR
INR: 1.1 (ref 0.8–1.2)
Prothrombin Time: 14.4 seconds (ref 11.4–15.2)

## 2021-01-31 LAB — RESP PANEL BY RT-PCR (FLU A&B, COVID) ARPGX2
Influenza A by PCR: NEGATIVE
Influenza B by PCR: NEGATIVE
SARS Coronavirus 2 by RT PCR: NEGATIVE

## 2021-01-31 LAB — CBC
HCT: 30.1 % — ABNORMAL LOW (ref 39.0–52.0)
Hemoglobin: 9.7 g/dL — ABNORMAL LOW (ref 13.0–17.0)
MCH: 30.1 pg (ref 26.0–34.0)
MCHC: 32.2 g/dL (ref 30.0–36.0)
MCV: 93.5 fL (ref 80.0–100.0)
Platelets: 207 10*3/uL (ref 150–400)
RBC: 3.22 MIL/uL — ABNORMAL LOW (ref 4.22–5.81)
RDW: 13.4 % (ref 11.5–15.5)
WBC: 7.8 10*3/uL (ref 4.0–10.5)
nRBC: 0 % (ref 0.0–0.2)

## 2021-01-31 MED ORDER — LIDOCAINE HCL (PF) 1 % IJ SOLN
15.0000 mL | Freq: Once | INTRAMUSCULAR | Status: AC
  Administered 2021-01-31: 15 mL

## 2021-01-31 MED ORDER — AMLODIPINE BESYLATE 5 MG PO TABS: 5.0000 mg | ORAL_TABLET | Freq: Every day | ORAL | Status: DC

## 2021-01-31 MED ORDER — VANCOMYCIN HCL 500 MG/100ML IV SOLN
INTRAVENOUS | Status: AC | PRN
  Administered 2021-01-31: 1000 mg via INTRAVENOUS

## 2021-01-31 MED ORDER — LACTULOSE 10 GM/15ML PO SOLN
20.0000 g | Freq: Once | ORAL | Status: AC
  Administered 2021-01-31: 20 g via ORAL
  Filled 2021-01-31: qty 30

## 2021-01-31 MED ORDER — FENTANYL CITRATE (PF) 100 MCG/2ML IJ SOLN
INTRAMUSCULAR | Status: AC | PRN
  Administered 2021-01-31 (×3): 25 ug via INTRAVENOUS

## 2021-01-31 MED ORDER — MIDAZOLAM HCL 2 MG/2ML IJ SOLN
INTRAMUSCULAR | Status: AC | PRN
  Administered 2021-01-31 (×2): .5 mg via INTRAVENOUS

## 2021-01-31 MED ORDER — OXYCODONE HCL 5 MG PO TABS
5.0000 mg | ORAL_TABLET | Freq: Four times a day (QID) | ORAL | 0 refills | Status: DC | PRN
Start: 2021-01-31 — End: 2021-09-20

## 2021-01-31 NOTE — Procedures (Addendum)
Interventional Radiology Procedure Note  Date of Procedure: 01/31/2021  Procedure: L2 kyphoplasty   Findings:  1. Successful L2 kyphoplasty via bipedicular approach    Complications: No immediate complications noted.   Estimated Blood Loss: minimal  Follow-up and Recommendations: 1. Bedrest  3 hours    Albin Felling, MD  Vascular & Interventional Radiology  01/31/2021 11:00 AM

## 2021-01-31 NOTE — Discharge Summary (Signed)
Physician Discharge Summary  Patient ID: Jacob [name] MRN: [id number] DOB/AGE: 04-19-1928 88 y.o.  Admit date: 01/25/2021 Discharge date: 01/31/2021  Admission Diagnoses:  Discharge Diagnoses:  Principal Problem:   Constipation Active Problems:   CAD (coronary artery disease)   HTN (hypertension)   COPD (chronic obstructive pulmonary disease) (HCC)   Type 2 diabetes mellitus without complication, without long-term current use of insulin (HCC)   Back pain   CKD stage 4 secondary to hypertension (HCC)   Closed compression fracture of L2 lumbar vertebra, sequela   Hyperkalemia   Acute renal failure superimposed on stage 4 chronic kidney disease ([place])   Discharged Condition: good  Hospital Course:  Jacob [name] is a 88 year old male with history of paroxysmal atrial fibrillation, coronary artery disease, history of renal cell carcinoma status post left nephrectomy, essential hypertension, chronic kidney disease stage IV who presented to the hospital with 2 weeks of intractable back pain. CT scan showed L2 compression fracture.   Intractable lower back pain secondary to L2 compression fracture. L2 compression lumbar fracture. Patient had a kyphoplasty today, pain is better.  Continue as needed Percocet.  Patient will be discharged to SNF today after 3 hours post surgery.   Hyperkalemia secondary to chronic kidney disease stage IV. Acute on Chronic kidney disease stage IV. Ruled In. Hyponatremia. Condition had improved to baseline.   Constipation. Improved.   Type 2 diabetes.   Continue to follow.   Essential hypertension. Discontinued losartan due to significant hyperkalemia, on Norvasc.   Consults: IR  Significant Diagnostic Studies:   Treatments: Symptomatic treatment, kyphoplasty.  Discharge Exam: Blood pressure 140/78, pulse 76, temperature 98.5 F (36.9 C), resp. rate 20, height 5\' 6"  (1.676 m), weight 86.2 kg, SpO2 98 %. General appearance: alert and  cooperative Resp: clear to auscultation bilaterally Cardio: regular rate and rhythm, S1, S2 normal, no murmur, click, rub or gallop GI: soft, non-tender; bowel sounds normal; no masses,  no organomegaly Extremities: extremities normal, atraumatic, no cyanosis or edema  Disposition: Discharge disposition: 03-Skilled Nursing Facility       Discharge Instructions     Diet - low sodium heart healthy   Complete by: As directed    Increase activity slowly   Complete by: As directed       Allergies as of 01/31/2021       Reactions   Penicillins Hives   BLISTERS        Medication List     STOP taking these medications    HYDROcodone-acetaminophen 5-325 MG tablet Commonly known as: NORCO/VICODIN   losartan 50 MG tablet Commonly known as: COZAAR       TAKE these medications    acetaminophen 325 MG tablet Commonly known as: TYLENOL Take 650 mg by mouth every 4 (four) hours as needed.   amLODipine 5 MG tablet Commonly known as: NORVASC Take 1 tablet (5 mg total) by mouth daily.   aspirin 81 MG tablet Take 81 mg by mouth daily.   atorvastatin 40 MG tablet Commonly known as: LIPITOR TAKE 1 TABLET BY MOUTH ONCE DAILY. STOP LOVASTATIN.   cetirizine 10 MG tablet Commonly known as: ZYRTEC Take 10 mg by mouth daily.   esomeprazole 20 MG capsule Commonly known as: NEXIUM Take 20 mg by mouth daily.   FeroSul 325 (65 FE) MG tablet Generic drug: ferrous sulfate Take 325 mg by mouth daily.   Fish Oil 1200 MG Caps Take 1,200 mg by mouth daily.   Gemtesa 75 MG Tabs Generic drug:  Vibegron Take 75 mg by mouth daily.   isosorbide mononitrate 30 MG 24 hr tablet Commonly known as: IMDUR Take 30 mg by mouth at bedtime.   latanoprost 0.005 % ophthalmic solution Commonly known as: XALATAN Place 1 drop into both eyes at bedtime.   multivitamins ther. w/minerals Tabs tablet Take 1 tablet by mouth daily.   oxyCODONE 5 MG immediate release tablet Commonly known  as: Roxicodone Take 1 tablet (5 mg total) by mouth every 6 (six) hours as needed. What changed: when to take this   ProAir HFA 108 (90 Base) MCG/ACT inhaler Generic drug: albuterol Inhale 1 puff into the lungs every 4 (four) hours as needed.   senna-docusate 8.6-50 MG tablet Commonly known as: Senokot-S Take 1 tablet by mouth 2 (two) times daily. While taking pain meds to prevent constipation   Symbicort 160-4.5 MCG/ACT inhaler Generic drug: budesonide-formoterol INHALE 2 PUFFS INTO LUNGS TWICE DAILY. RINSE MOUTH AFTER USE.        Contact information for follow-up providers     [name], MD Follow up in 1 week(s).   Specialty: Internal Medicine Contact information: [place]  [address] [phone]              Contact information for after-discharge care     Destination     [facility] .   Service: Skilled Nursing Contact information: [address] [phone]                    32 minutes Signed: Sharen Hones 01/31/2021, 12:08 PM

## 2021-01-31 NOTE — Progress Notes (Signed)
Patient clinically stable post Kyphoplasty/lumbar, tolerated well. Vitals stable pre and post procedure. Denies complaints presently. Kelsi/RN given bedside report porst procedure/recovery. Received Versed 1 mg along with Fentanyl 75 mcg IV for procedure. Awake/alert and oriented post procedure.

## 2021-01-31 NOTE — Care Management Important Message (Signed)
Important Message  Patient Details  Name: Jacob [name] MRN: [id number] Date of Birth: [birth year]   Medicare Important Message Given:  Yes     [name] A Julez Huseby 01/31/2021, 11:36 AM

## 2021-01-31 NOTE — TOC Progression Note (Signed)
Transition of Care [facility]) - Progression Note    Patient Details  Name: Jacob [name] MRN: [id number] Date of Birth: 06/22/1928  Transition of Care [facility]) CM/SW [place], RN Phone Number: 01/31/2021, 2:04 PM  Clinical Narrative:    The patient will not DC until Tomorrow to [facility], will not be able to restart the ins process until tomorrow, will expire today at midnight, I notified Ricky at [facility]         Expected Discharge Plan and Services           Expected Discharge Date: 01/31/21                                     Social Determinants of Health (SDOH) Interventions    Readmission Risk Interventions No flowsheet data found.

## 2021-01-31 NOTE — Progress Notes (Signed)
Physical Therapy Treatment Patient Details Name: Jacob [name] MRN: [id number] DOB: 10/16/1928 Today's Date: 01/31/2021   History of Present Illness 46 M who presented to ED 1/17 with back pain. Pt was found to have L2 compression fracture and was ordered to wear TLSO and follow up with outpatient neurosurgery. Pt was discharged, but then returned to ED 1/24 with lower back pain and constipation x2 weeks. Past medical history significant for GERD, diverticulitis, HTN, HDL, renal cancer, and prostate cancer.    PT Comments    Pt off bedrest orders post kyphoplasty. Pre medicated before session.  Transitioned to EOB and brace donned.  Transferred to commode for small soft BM then to recliner to eat.  Pt reports little change in pain post procedure.     Recommendations for follow up therapy are one component of a multi-disciplinary discharge planning process, led by the attending physician.  Recommendations may be updated based on patient status, additional functional criteria and insurance authorization.  Follow Up Recommendations  Skilled nursing-short term rehab (<3 hours/day)     Assistance Recommended at Discharge    Patient can return home with the following A little help with walking and/or transfers;A little help with bathing/dressing/bathroom;Assistance with cooking/housework;Assist for transportation;Help with stairs or ramp for entrance   Equipment Recommendations  Rolling walker (2 wheels)    Recommendations for Other Services       Precautions / Restrictions Precautions Precautions: Fall Required Braces or Orthoses: Spinal Brace Spinal Brace: Thoracolumbosacral orthotic Restrictions Other Position/Activity Restrictions: Pt refusing to use front stability piece on brace.     Mobility  Bed Mobility Overal bed mobility: Needs Assistance Bed Mobility: Rolling, Sidelying to Sit Rolling: Min assist Sidelying to sit: Min assist            Transfers Overall  transfer level: Needs assistance Equipment used: Rolling walker (2 wheels) Transfers: Sit to/from Stand Sit to Stand: Min guard, Min assist           General transfer comment: cues for hand placement    Ambulation/Gait Ambulation/Gait assistance: Min guard Gait Distance (Feet): 4 Feet Assistive device: Rolling walker (2 wheels) Gait Pattern/deviations: Antalgic, Step-to pattern Gait velocity: decreased     General Gait Details: to commode then to chair.   Stairs             Wheelchair Mobility    Modified Rankin (Stroke Patients Only)       Balance Overall balance assessment: Needs assistance Sitting-balance support: Feet supported Sitting balance-Leahy Scale: Good Sitting balance - Comments: steady despite pain   Standing balance support: During functional activity, Bilateral upper extremity supported Standing balance-Leahy Scale: Good                              Cognition Arousal/Alertness: Awake/alert Behavior During Therapy: WFL for tasks assessed/performed Overall Cognitive Status: Within Functional Limits for tasks assessed                                          Exercises      General Comments        Pertinent Vitals/Pain Pain Assessment Pain Assessment: Faces Faces Pain Scale: Hurts even more Pain Location: lower back Pain Descriptors / Indicators: Sore, Grimacing, Guarding Pain Intervention(s): Limited activity within patient's tolerance, Monitored during session, Premedicated before session    Home  Living                          Prior Function            PT Goals (current goals can now be found in the care plan section) Progress towards PT goals: Progressing toward goals    Frequency    7X/week      PT Plan Current plan remains appropriate    Co-evaluation              AM-PAC PT "6 Clicks" Mobility   Outcome Measure  Help needed turning from your back to your side  while in a flat bed without using bedrails?: A Little Help needed moving from lying on your back to sitting on the side of a flat bed without using bedrails?: A Little Help needed moving to and from a bed to a chair (including a wheelchair)?: A Little Help needed standing up from a chair using your arms (e.g., wheelchair or bedside chair)?: A Little Help needed to walk in hospital room?: A Little Help needed climbing 3-5 steps with a railing? : A Lot 6 Click Score: 17    End of Session Equipment Utilized During Treatment: Back brace Activity Tolerance: Patient tolerated treatment well Patient left: in chair;with call bell/phone within reach;with chair alarm set;with family/visitor present Nurse Communication: Mobility status;Other (comment) PT Visit Diagnosis: Muscle weakness (generalized) (M62.81);Unsteadiness on feet (R26.81);Difficulty in walking, not elsewhere classified (R26.2);Pain     Time: 8938-1017 PT Time Calculation (min) (ACUTE ONLY): 18 min  Charges:  $Therapeutic Activity: 8-22 mins                    Chesley Noon, PTA 01/31/21, 4:20 PM

## 2021-02-01 DIAGNOSIS — N178 Other acute kidney failure: Secondary | ICD-10-CM | POA: Diagnosis not present

## 2021-02-01 DIAGNOSIS — N184 Chronic kidney disease, stage 4 (severe): Secondary | ICD-10-CM | POA: Diagnosis not present

## 2021-02-01 DIAGNOSIS — S32020S Wedge compression fracture of second lumbar vertebra, sequela: Secondary | ICD-10-CM | POA: Diagnosis not present

## 2021-02-01 DIAGNOSIS — K5900 Constipation, unspecified: Secondary | ICD-10-CM | POA: Diagnosis not present

## 2021-02-01 LAB — CBC WITH DIFFERENTIAL/PLATELET
Abs Immature Granulocytes: 0.03 10*3/uL (ref 0.00–0.07)
Basophils Absolute: 0 10*3/uL (ref 0.0–0.1)
Basophils Relative: 1 %
Eosinophils Absolute: 0.3 10*3/uL (ref 0.0–0.5)
Eosinophils Relative: 4 %
HCT: 29.3 % — ABNORMAL LOW (ref 39.0–52.0)
Hemoglobin: 9.3 g/dL — ABNORMAL LOW (ref 13.0–17.0)
Immature Granulocytes: 0 %
Lymphocytes Relative: 20 %
Lymphs Abs: 1.5 10*3/uL (ref 0.7–4.0)
MCH: 30.3 pg (ref 26.0–34.0)
MCHC: 31.7 g/dL (ref 30.0–36.0)
MCV: 95.4 fL (ref 80.0–100.0)
Monocytes Absolute: 0.9 10*3/uL (ref 0.1–1.0)
Monocytes Relative: 12 %
Neutro Abs: 4.9 10*3/uL (ref 1.7–7.7)
Neutrophils Relative %: 63 %
Platelets: 188 10*3/uL (ref 150–400)
RBC: 3.07 MIL/uL — ABNORMAL LOW (ref 4.22–5.81)
RDW: 13.5 % (ref 11.5–15.5)
WBC: 7.7 10*3/uL (ref 4.0–10.5)
nRBC: 0 % (ref 0.0–0.2)

## 2021-02-01 LAB — BASIC METABOLIC PANEL
Anion gap: 9 (ref 5–15)
BUN: 58 mg/dL — ABNORMAL HIGH (ref 8–23)
CO2: 25 mmol/L (ref 22–32)
Calcium: 8.8 mg/dL — ABNORMAL LOW (ref 8.9–10.3)
Chloride: 105 mmol/L (ref 98–111)
Creatinine, Ser: 1.95 mg/dL — ABNORMAL HIGH (ref 0.61–1.24)
GFR, Estimated: 32 mL/min — ABNORMAL LOW (ref >60)
Glucose, Bld: 134 mg/dL — ABNORMAL HIGH (ref 70–99)
Potassium: 4.6 mmol/L (ref 3.5–5.1)
Sodium: 139 mmol/L (ref 135–145)

## 2021-02-01 MED ORDER — LACTULOSE 20 GM/30ML PO SOLN
20.0000 g | Freq: Every day | ORAL | Status: DC | PRN
Start: 2021-02-01 — End: 2021-10-11

## 2021-02-01 NOTE — Progress Notes (Signed)
DISCHARGE NOTE:  Report called and given to Gregary Signs, Therapist, sports at [facility]. EMS here to transport patient. Back brace on. All belongings sent with patient.

## 2021-02-01 NOTE — Progress Notes (Signed)
Referring Physician(s): Dr. Roosevelt Locks  Supervising Physician: Juliet Rude  Patient Status:  [facility] - In-pt  Reason for visit: Symptomatic L2 compression fracture s/p successful L2 Kyphoplasty with IR 1/30  Subjective: Patient states his back pain is slowly improving, he states the sharp 10/10 back pain is gone and his 8/10 dull ache back pain is now 5/10. He denies any new pain or radicular symptoms.   Allergies: Penicillins  Medications: Prior to Admission medications   Medication Sig Start Date End Date Taking? Authorizing Provider  HYDROcodone-acetaminophen (NORCO/VICODIN) 5-325 MG tablet Take 1 tablet by mouth 2 (two) times daily as needed. 01/24/21  Yes [provider]  Lactulose 20 GM/30ML SOLN Take 30 mLs (20 g total) by mouth daily as needed. 02/01/21  Yes Sharen Hones, MD  senna-docusate (SENOKOT-S) 8.6-50 MG per tablet Take 1 tablet by mouth 2 (two) times daily. While taking pain meds to prevent constipation 08/26/13  Yes [name], MD  acetaminophen (TYLENOL) 325 MG tablet Take 650 mg by mouth every 4 (four) hours as needed.    [provider]  amLODipine (NORVASC) 5 MG tablet Take 1 tablet (5 mg total) by mouth daily. 01/31/21   Sharen Hones, MD  aspirin 81 MG tablet Take 81 mg by mouth daily.    [provider]  atorvastatin (LIPITOR) 40 MG tablet TAKE 1 TABLET BY MOUTH ONCE DAILY. STOP LOVASTATIN. 11/24/20   Deboraha Sprang, MD  cetirizine (ZYRTEC) 10 MG tablet Take 10 mg by mouth daily.    [provider]  esomeprazole (NEXIUM) 20 MG capsule Take 20 mg by mouth daily. 05/24/20   [provider]  FEROSUL 325 (65 Fe) MG tablet Take 325 mg by mouth daily. Patient not taking: Reported on 01/26/2021 05/24/20   [provider]  isosorbide mononitrate (IMDUR) 30 MG 24 hr tablet Take 30 mg by mouth at bedtime.    [provider]  latanoprost (XALATAN) 0.005 % ophthalmic solution Place 1 drop into both eyes at  bedtime.    [provider]  losartan (COZAAR) 50 MG tablet Take 1 tablet by mouth every morning.  11/05/13   [provider]  Multiple Vitamins-Minerals (MULTIVITAMINS THER. W/MINERALS) TABS Take 1 tablet by mouth daily.    [provider]  Omega-3 Fatty Acids (FISH OIL) 1200 MG CAPS Take 1,200 mg by mouth daily.    [provider]  oxyCODONE (ROXICODONE) 5 MG immediate release tablet Take 1 tablet (5 mg total) by mouth every 6 (six) hours as needed. 01/31/21   Sharen Hones, MD  PROAIR HFA 108 [phone] Base) MCG/ACT inhaler Inhale 1 puff into the lungs every 4 (four) hours as needed.  06/14/16   [provider]  SYMBICORT 160-4.5 MCG/ACT inhaler INHALE 2 PUFFS INTO LUNGS TWICE DAILY. RINSE MOUTH AFTER USE. Patient not taking: Reported on 01/26/2021 01/03/18   Laverle Hobby, MD  Vibegron (GEMTESA) 75 MG TABS Take 75 mg by mouth daily. Patient not taking: Reported on 01/26/2021 09/23/20   [name], MD   Vital Signs: BP (!) 153/51 (BP Location: Left Arm)    Pulse 67    Temp 98.1 F (36.7 C)    Resp 16    Ht 5\' 6"  (1.676 m)    Wt 190 lb (86.2 kg)    SpO2 97%    BMI 30.67 kg/m   Physical Exam General: Alert, sitting up in chair, NAD Back: with TSLO brace on  Imaging: IR KYPHO LUMBAR INC FX  REDUCE BONE BX UNI/BIL CANNULATION INC/IMAGING  Result Date: 01/31/2021 INDICATION: L2 compression fracture with back pain EXAM: L2 kyphoplasty COMPARISON:  CT lumbar spine January 18, 2021 MEDICATIONS: Refer to EMR ANESTHESIA/SEDATION: Moderate (conscious) sedation was employed during this procedure. A total of Versed 1 mg and Fentanyl 75 mcg was administered intravenously by the radiology nurse. Total intra-service moderate Sedation Time: 52 minutes. The patient's level of consciousness and vital signs were monitored continuously by radiology nursing throughout the procedure under my direct supervision. FLUOROSCOPY TIME:  6.8 minutes (90 mGy) COMPLICATIONS: None  immediate. PROCEDURE: Informed written consent was obtained from the patient after a thorough discussion of the procedural risks, benefits and alternatives. All questions were addressed. Maximal Sterile Barrier Technique was utilized including caps, mask, sterile gowns, sterile gloves, sterile drape, hand hygiene and skin antiseptic. A timeout was performed prior to the initiation of the procedure. The patient was positioned prone on the exam table. A bipedicular access was planned. Skin entry site(s) were marked overlying the L2 vertebral body using fluoroscopy. The overlying skin was then prepped and draped in the standard sterile fashion. Local analgesia was obtained with 1% lidocaine. Attention was first turned to the right side. Under fluoroscopic guidance, a 10 gauge introducer needle was advanced towards the lateral margin of the pedicle. Using multiple projections, the introducer needle was advanced towards the posterior margin of the vertebral body via a transpedicular approach. The inner needle was then removed, and the drill was advanced towards the anterior margin of the vertebral body. Attention was then turned to the contralateral side. Under fluoroscopic guidance, a 10 gauge introducer needle was advanced towards the lateral margin of the pedicle. Using multiple projections, the introducer needle was advanced towards the posterior margin of the vertebral body via a transpedicular approach. The inner needle was then removed, and the drill was advanced towards the anterior margin of the vertebral body. The Kyphon 15 mm inflatable bone tamps were then advanced through the bilateral transpedicular access needles and positioned within the mid vertebral body. Kyphoplasty was then performed, ensuring that the balloon contours stayed within the vertebral body margins. The balloons were then deflated and removed, followed by advancement of the bone filler devices bilaterally and the instillation of acrylic  bone cement with excellent filling in the AP and lateral projections. No extravasation was noted in the disk spaces or posteriorly into the spinal canal. No epidural venous contamination was seen. At the end of the procedure, the introducer cannulas and bone filler devices were then removed without difficulty. Clean dressings were placed after hemostasis. The patient tolerated all aspects of the procedure well, and was transferred to recovery in stable condition. IMPRESSION: Successful L2 vertebral body augmentation using balloon kyphoplasty. Electronically Signed   By: Albin Felling M.D.   On: 01/31/2021 13:58    Labs:  CBC: Recent Labs    01/28/21 0618 01/29/21 0459 01/31/21 0448 02/01/21 0430  WBC 7.6 8.2 7.8 7.7  HGB 8.8* 9.3* 9.7* 9.3*  HCT 26.9* 28.2* 30.1* 29.3*  PLT 208 203 207 188    COAGS: Recent Labs    01/31/21 0448  INR 1.1    BMP: Recent Labs    01/28/21 0618 01/29/21 0459 01/31/21 0448 02/01/21 0430  NA 136 139 140 139  K 4.5 4.7 4.4 4.6  CL 103 103 105 105  CO2 25 26 27 25   GLUCOSE 126* 128* 130* 134*  BUN 53* 49* 55* 58*  CALCIUM 8.5* 8.9 9.0 8.8*  CREATININE 1.93*  1.74* 1.77* 1.95*  GFRNONAA 32* 37* 36* 32*    LIVER FUNCTION TESTS: Recent Labs    01/25/21 1614  BILITOT 0.8  AST 30  ALT 21  ALKPHOS 70  PROT 7.4  ALBUMIN 3.7    Assessment and Plan: Symptomatic L2 compression fracture s/p successful L2 Kyphoplasty with IR, Dr. Denna Haggard 1/30 Clinically improving with decreasing back pain and no signs of complication, maximum pain relief can happen 2 weeks post procedure, expect continued pain relief within the next week. Plan for discharge to rehab, please don't hesitate to contact IR with any questions or concerns.   Electronically Signed: Hedy Jacob, PA-C 02/01/2021, 10:52 AM   I spent a total of 15 Minutes at the the patient's bedside AND on the patient's hospital floor or unit, greater than 50% of which was counseling/coordinating  care for L2 symptomatic compression fracture.

## 2021-02-01 NOTE — Discharge Summary (Signed)
Physician Discharge Summary  Patient ID: Jacob [name] MRN: [id number] DOB/AGE: 1928-12-16 88 y.o.  Admit date: 01/25/2021 Discharge date: 02/01/2021  Admission Diagnoses:  Discharge Diagnoses:  Principal Problem:   Constipation Active Problems:   CAD (coronary artery disease)   HTN (hypertension)   COPD (chronic obstructive pulmonary disease) (HCC)   Type 2 diabetes mellitus without complication, without long-term current use of insulin (HCC)   Back pain   CKD stage 4 secondary to hypertension (HCC)   Closed compression fracture of L2 lumbar vertebra, sequela   Hyperkalemia   Acute renal failure superimposed on stage 4 chronic kidney disease ([place])   Discharged Condition: good  Hospital Course:  Wiliam Cauthorn is a 88 year old male with history of paroxysmal atrial fibrillation, coronary artery disease, history of renal cell carcinoma status post left nephrectomy, essential hypertension, chronic kidney disease stage IV who presented to the hospital with 2 weeks of intractable back pain. CT scan showed L2 compression fracture.   Intractable lower back pain secondary to L2 compression fracture. L2 compression lumbar fracture. Patient had a kyphoplasty today, pain is better.  Continue as needed Percocet.  Possible procedure, IR wanted keep patient overnight to monitor, did not have any event overnight.  He is medically stable to be discharged.   Hyperkalemia secondary to chronic kidney disease stage IV. Acute on Chronic kidney disease stage IV. Ruled In. Hyponatremia. Condition had improved to baseline.   Constipation. Improved.   Type 2 diabetes.   Continue to follow.   Essential hypertension. Discontinued losartan due to significant hyperkalemia, on Norvasc.      Consults: IR  Significant Diagnostic Studies:   Treatments: Symptomatic treatment, kyphoplasty.  Discharge Exam: Blood pressure (!) 153/51, pulse 67, temperature 98.1 F (36.7 C), resp. rate 16,  height 5\' 6"  (1.676 m), weight 86.2 kg, SpO2 97 %. General appearance: alert and cooperative Resp: clear to auscultation bilaterally Cardio: regular rate and rhythm, S1, S2 normal, no murmur, click, rub or gallop GI: soft, non-tender; bowel sounds normal; no masses,  no organomegaly Extremities: extremities normal, atraumatic, no cyanosis or edema  Disposition: Discharge disposition: 03-Skilled Nursing Facility       Discharge Instructions     Diet - low sodium heart healthy   Complete by: As directed    Increase activity slowly   Complete by: As directed       Allergies as of 02/01/2021       Reactions   Penicillins Hives   BLISTERS        Medication List     STOP taking these medications    HYDROcodone-acetaminophen 5-325 MG tablet Commonly known as: NORCO/VICODIN   losartan 50 MG tablet Commonly known as: COZAAR       TAKE these medications    acetaminophen 325 MG tablet Commonly known as: TYLENOL Take 650 mg by mouth every 4 (four) hours as needed.   amLODipine 5 MG tablet Commonly known as: NORVASC Take 1 tablet (5 mg total) by mouth daily.   aspirin 81 MG tablet Take 81 mg by mouth daily.   atorvastatin 40 MG tablet Commonly known as: LIPITOR TAKE 1 TABLET BY MOUTH ONCE DAILY. STOP LOVASTATIN.   cetirizine 10 MG tablet Commonly known as: ZYRTEC Take 10 mg by mouth daily.   esomeprazole 20 MG capsule Commonly known as: NEXIUM Take 20 mg by mouth daily.   FeroSul 325 (65 FE) MG tablet Generic drug: ferrous sulfate Take 325 mg by mouth daily.   Fish Oil 1200 MG  Caps Take 1,200 mg by mouth daily.   Gemtesa 75 MG Tabs Generic drug: Vibegron Take 75 mg by mouth daily.   isosorbide mononitrate 30 MG 24 hr tablet Commonly known as: IMDUR Take 30 mg by mouth at bedtime.   latanoprost 0.005 % ophthalmic solution Commonly known as: XALATAN Place 1 drop into both eyes at bedtime.   multivitamins ther. w/minerals Tabs tablet Take 1  tablet by mouth daily.   oxyCODONE 5 MG immediate release tablet Commonly known as: Roxicodone Take 1 tablet (5 mg total) by mouth every 6 (six) hours as needed. What changed: when to take this   ProAir HFA 108 (90 Base) MCG/ACT inhaler Generic drug: albuterol Inhale 1 puff into the lungs every 4 (four) hours as needed.   senna-docusate 8.6-50 MG tablet Commonly known as: Senokot-S Take 1 tablet by mouth 2 (two) times daily. While taking pain meds to prevent constipation   Symbicort 160-4.5 MCG/ACT inhaler Generic drug: budesonide-formoterol INHALE 2 PUFFS INTO LUNGS TWICE DAILY. RINSE MOUTH AFTER USE.        Contact information for follow-up providers     [name], MD Follow up in 1 week(s).   Specialty: Internal Medicine Why: Facility will make Appt. Contact information: [place] [address] [phone]              Contact information for after-discharge care     Destination     [facility] .   Service: Skilled Nursing Contact information: [address] [place] [phone]                     Signed: Sharen Hones 02/01/2021, 9:24 AM

## 2021-02-01 NOTE — TOC Progression Note (Signed)
Transition of Care [facility]) - Progression Note    Patient Details  Name: Jacob [name] MRN: [id number] Date of Birth: October 20, 1928  Transition of Care [facility]) CM/SW [place], RN Phone Number: 02/01/2021, 11:14 AM  Clinical Narrative:   EMS called to transport the patient to [facility] room E12, Son Trooper called at [phone], left a general vm for a call back, patient is alert and oriented and can notify family also         Expected Discharge Plan and Services           Expected Discharge Date: 02/01/21                                     Social Determinants of Health (SDOH) Interventions    Readmission Risk Interventions No flowsheet data found.

## 2021-02-24 ENCOUNTER — Other Ambulatory Visit: Payer: Medicare Other

## 2021-03-02 ENCOUNTER — Ambulatory Visit: Payer: Medicare Other | Admitting: Urology

## 2021-03-09 ENCOUNTER — Ambulatory Visit (INDEPENDENT_AMBULATORY_CARE_PROVIDER_SITE_OTHER): Payer: Medicare Other

## 2021-03-09 DIAGNOSIS — I442 Atrioventricular block, complete: Secondary | ICD-10-CM | POA: Diagnosis not present

## 2021-03-09 LAB — CUP PACEART REMOTE DEVICE CHECK
Battery Impedance: 1161 Ohm
Battery Remaining Longevity: 49 mo
Battery Voltage: 2.77 V
Brady Statistic AP VP Percent: 67 %
Brady Statistic AP VS Percent: 1 %
Brady Statistic AS VP Percent: 31 %
Brady Statistic AS VS Percent: 1 %
Date Time Interrogation Session: 20230308133105
Implantable Lead Implant Date: 20150903
Implantable Lead Implant Date: 20150903
Implantable Lead Location: 753859
Implantable Lead Location: 753860
Implantable Lead Model: 5076
Implantable Lead Model: 5076
Implantable Pulse Generator Implant Date: 20150903
Lead Channel Impedance Value: 433 Ohm
Lead Channel Impedance Value: 515 Ohm
Lead Channel Pacing Threshold Amplitude: 0.75 V
Lead Channel Pacing Threshold Amplitude: 0.75 V
Lead Channel Pacing Threshold Pulse Width: 0.4 ms
Lead Channel Pacing Threshold Pulse Width: 0.4 ms
Lead Channel Setting Pacing Amplitude: 2 V
Lead Channel Setting Pacing Amplitude: 2.5 V
Lead Channel Setting Pacing Pulse Width: 0.4 ms
Lead Channel Setting Sensing Sensitivity: 4 mV

## 2021-03-14 NOTE — Progress Notes (Incomplete)
03/14/21 8:40 AM   Jacob [name] Feb 22, 1928 [id number]  Referring provider:  [name], MD Jacob [name],  Jacob [name] [id number] No chief complaint on file.    HPI: Jacob [name] is a 88 y.o.male with a personal history of with multiple GU issues , who presents today for a 6 month follow-up with PSA and UA.   He underwent prostate biopsy on 06/04/2018  for evaluation of rising PSA with a PSA as high as 10.6 in 04/2018.In addition to this, he had patchy nonfocal uptake on PET on 09/2017. Surgical pathology showed  7 of 12 positive for prostate cancer involving cores on the left up to 100% of Gleason 4+5 and 4+3 as well as a single core of Gleason 3+3 at the right lateral base. T2b, iPSA 10.   He is s/p nephroureterectomy in 08/2013 for large volume pTaG3 renal pelvis cancer with negative margins by Dr. Tresa [name]. 02/2016 CT negative apart from 7 mm pulm nodule   He also is s/p TaG3 TURBT in 2012 for bladder cancer with induction BCG x6. with last recurrence being in 2016 he underwent repeat TaG3 TURBT with re-induction BCG x6.   He recently underwent imagine gin the form of CT abdomen and pelvis w/o contrast on 01/25/2021 that visualized a 53mm upper pole renal stone on the right as seen previously on CT on 01/18/2021. No hydronephrosis.   PMH: Past Medical History:  Diagnosis Date   Arthritis    hands   At risk for sleep apnea    STOP-BANG=  4     SENT TO PCP [id number]   BPH (benign prostatic hypertrophy)    Coronary artery disease cardiologist-- dr Fletcher Anon  and dr gregg taylor   a. 2006 s/p PCI/Stenting x 2 to the RCA;  b. 05/03/12 Cardiac CT: Patent RCA stent, nonobs dzs;  c. 01/2013 Ex MV: nl EF, no ischemia.   GERD (gastroesophageal reflux disease)    Glaucoma of both eyes    H/O hiatal hernia    History of atrial fibrillation    a. 2006.   History of diverticulitis    History of kidney stones    History of renal cell carcinoma    08-22-2013  s/p  left nephrouretectomy    Hyperlipidemia    Hyperlipidemia    Hypertension    LBBB (left bundle branch block)    Mobitz type 2 second degree AV block    Recurrent bladder transitional cell carcinoma ([place]) monitored by dr Jacob [name]   s/p turb's  and chemo bladder instillations   Renal insufficiency    a. Creat rose to 2.34 post-op L nephrectomy.   S/P placement of cardiac pacemaker MEDTRONIC last pacer check 12-01-2013   08-29-2013  for symptomatic bradycardia and mobitz 2 second degree heart block   Symptomatic bradycardia    s/p  pacemaker placement 08-29-2013    Surgical History: Past Surgical History:  Procedure Laterality Date   APPENDECTOMY  02/1988   CATARACT EXTRACTION W/ INTRAOCULAR LENS  IMPLANT, BILATERAL  2011   CORONARY ANGIOPLASTY WITH STENT PLACEMENT  2006    [facility]   X2 STENTS  TO RCA   CYSTOSCOPY W/ RETROGRADES Right 01/13/2014   Procedure: CYSTOSCOPY WITH RETROGRADE PYELOGRAM;  Surgeon: Jacob Frock, MD;  [facility];  Service: Urology;  Laterality: Right;   CYSTOSCOPY W/ URETERAL STENT PLACEMENT Left 08/22/2013   Procedure: CYSTOSCOPY WITH RETROGRADE PYELOGRAM/URETERAL STENT PLACEMENT;  Surgeon: Jacob Frock, MD;  Location: [facility];  Service: Urology;  Laterality: Left;   CYSTOSCOPY WITH BIOPSY  04/13/2011     Procedure: CYSTOSCOPY WITH BIOPSY;  Surgeon: Jacob Rud, MD;  Location: [facility];  Service: Urology;  Laterality: N/A;  Cold Cup Biopsy   CYSTOSCOPY WITH RETROGRADE PYELOGRAM, URETEROSCOPY AND STENT PLACEMENT Left 06/19/2013   Procedure: CYSTOSCOPY WITH LEFT RETROGRADE PYELOGRAM, LEFT URETEROSCOPY, ureteral balloon dilatation, BIOPSY LEFT KIDNEY;  Surgeon: Jacob Rud, MD;  Location: [facility];  Service: Urology;  Laterality: Left;   INGUINAL HERNIA REPAIR Left 08/22/2013   Procedure: LAPAROSCOPIC INGUINAL HERNIA;  Surgeon: Jacob Frock, MD;  Location: [facility];  Service: Urology;  Laterality: Left;   IR KYPHO LUMBAR INC FX  REDUCE BONE BX UNI/BIL CANNULATION INC/IMAGING  01/31/2021   PERMANENT PACEMAKER INSERTION N/A 08/29/2013   Procedure: PERMANENT PACEMAKER INSERTION;  Surgeon: Jacob Lance, MD;  Location: [facility] C[facility] LAB;  Service: Cardiovascular;  Laterality: N/A;  Medtronic dual-chamber   ROBOT ASSITED LAPAROSCOPIC NEPHROURETERECTOMY Left 08/22/2013   Procedure: ROBOT ASSITED LAPAROSCOPIC NEPHROURETERECTOMY;  Surgeon: Jacob Frock, MD;  Location: [facility];  Service: Urology;  Laterality: Left;   TRANSTHORACIC ECHOCARDIOGRAM  08-29-2013   mild LVH/  ef 60-65% /  grade I diastolic dysfunction/  mild MR/  mild LAE  and RAE/  moderate TR   TRANSURETHRAL RESECTION OF BLADDER TUMOR  12-22-2010   TRANSURETHRAL RESECTION OF BLADDER TUMOR  08/04/2011   Procedure: TRANSURETHRAL RESECTION OF BLADDER TUMOR (TURBT);  Surgeon: Jacob Rud, MD;  Location: [facility];  Service: Urology;  Laterality: N/A;   TRANSURETHRAL RESECTION OF BLADDER TUMOR N/A 01/13/2014   Procedure: TRANSURETHRAL RESECTION OF BLADDER TUMOR (TURBT);  Surgeon: Jacob Frock, MD;  Location: [facility];  Service: Urology;  Laterality: N/A;    Home Medications:  Allergies as of 03/16/2021       Reactions   Penicillins Hives   BLISTERS        Medication List        Accurate as of March 14, 2021  8:40 AM. If you have any questions, ask your nurse or doctor.          acetaminophen 325 MG tablet Commonly known as: TYLENOL Take 650 mg by mouth every 4 (four) hours as needed.   amLODipine 5 MG tablet Commonly known as: NORVASC Take 1 tablet (5 mg total) by mouth daily.   aspirin 81 MG tablet Take 81 mg by mouth daily.   atorvastatin 40 MG tablet Commonly known as: LIPITOR TAKE 1 TABLET BY MOUTH ONCE DAILY. STOP LOVASTATIN.   cetirizine 10 MG tablet Commonly known as: ZYRTEC Take 10 mg by mouth daily.   esomeprazole 20 MG capsule Commonly known as: NEXIUM Take 20 mg by mouth daily.   FeroSul  325 (65 FE) MG tablet Generic drug: ferrous sulfate Take 325 mg by mouth daily.   Fish Oil 1200 MG Caps Take 1,200 mg by mouth daily.   Gemtesa 75 MG Tabs Generic drug: Vibegron Take 75 mg by mouth daily.   isosorbide mononitrate 30 MG 24 hr tablet Commonly known as: IMDUR Take 30 mg by mouth at bedtime.   Lactulose 20 GM/30ML Soln Take 30 mLs (20 g total) by mouth daily as needed.   latanoprost 0.005 % ophthalmic solution Commonly known as: XALATAN Place 1 drop into both eyes at bedtime.   multivitamins ther. w/minerals Tabs tablet Take 1 tablet by mouth daily.   oxyCODONE 5 MG immediate release tablet Commonly known as:  Roxicodone Take 1 tablet (5 mg total) by mouth every 6 (six) hours as needed.   ProAir HFA 108 (90 Base) MCG/ACT inhaler Generic drug: albuterol Inhale 1 puff into the lungs every 4 (four) hours as needed.   senna-docusate 8.6-50 MG tablet Commonly known as: Senokot-S Take 1 tablet by mouth 2 (two) times daily. While taking pain meds to prevent constipation   Symbicort 160-4.5 MCG/ACT inhaler Generic drug: budesonide-formoterol INHALE 2 PUFFS INTO LUNGS TWICE DAILY. RINSE MOUTH AFTER USE.        Allergies:  Allergies  Allergen Reactions   Penicillins Hives    BLISTERS    Family History: Family History  Problem Relation Age of Onset   Heart attack Mother    Heart attack Father    Prostate cancer Neg Hx    Bladder Cancer Neg Hx    Kidney cancer Neg Hx     Social History:  reports that he quit smoking about 20 years ago. His smoking use included cigarettes. He has a 50.00 pack-year smoking history. He has never used smokeless tobacco. He reports that he does not drink alcohol and does not use drugs.   Physical Exam: There were no vitals taken for this visit.  Constitutional:  Alert and oriented, No acute distress. HEENT: [place] AT, moist mucus membranes.  Trachea midline, no masses. Cardiovascular: No clubbing, cyanosis, or  edema. Respiratory: Normal respiratory effort, no increased work of breathing. Skin: No rashes, bruises or suspicious lesions. Neurologic: Grossly intact, no focal deficits, moving all 4 extremities. Psychiatric: Normal mood and affect.  Laboratory Data:  Lab Results  Component Value Date   CREATININE 1.95 (H) 02/01/2021    Urinalysis   Pertinent Imaging:    Assessment & Plan:     No follow-ups on file.  I,Kailey Littlejohn,acting as a Education administrator for [name], MD.,have documented all relevant documentation on the behalf of [name], MD,as directed by  [name], MD while in the presence of [name], [place] [address], [place], [place] [address] [phone]

## 2021-03-15 ENCOUNTER — Other Ambulatory Visit: Payer: Medicare Other

## 2021-03-15 ENCOUNTER — Other Ambulatory Visit: Payer: Self-pay

## 2021-03-15 DIAGNOSIS — C61 Malignant neoplasm of prostate: Secondary | ICD-10-CM

## 2021-03-16 ENCOUNTER — Encounter: Payer: Self-pay | Admitting: Urology

## 2021-03-16 ENCOUNTER — Ambulatory Visit (INDEPENDENT_AMBULATORY_CARE_PROVIDER_SITE_OTHER): Payer: Medicare Other | Admitting: Urology

## 2021-03-16 VITALS — BP 151/64 | HR 79 | Ht 66.0 in

## 2021-03-16 DIAGNOSIS — C61 Malignant neoplasm of prostate: Secondary | ICD-10-CM | POA: Diagnosis not present

## 2021-03-16 DIAGNOSIS — Z8551 Personal history of malignant neoplasm of bladder: Secondary | ICD-10-CM

## 2021-03-16 DIAGNOSIS — C642 Malignant neoplasm of left kidney, except renal pelvis: Secondary | ICD-10-CM | POA: Diagnosis not present

## 2021-03-16 LAB — PSA: Prostate Specific Ag, Serum: <0.1 ng/mL (ref 0.0–4.0)

## 2021-03-16 MED ORDER — LEUPROLIDE ACETATE (6 MONTH) 45 MG ~~LOC~~ KIT
45.0000 mg | PACK | Freq: Once | SUBCUTANEOUS | Status: AC
  Administered 2021-03-16: 45 mg via SUBCUTANEOUS

## 2021-03-16 NOTE — Patient Instructions (Signed)
Take Vitamin D and Calcium Daily ?

## 2021-03-16 NOTE — Progress Notes (Signed)
Eligard SubQ Injection  ? ?Due to Prostate Cancer patient is present today for a Eligard Injection. ? ?Medication: Eligard 6 month ?Dose: 45 mg  ?Location: Left gluteal ?Lot: [id number] ?Exp: 07/24 ? ?Patient tolerated well, no complications were noted ? ?Performed by: Verlene Mayer, CMA ? ?Per Dr. [name] patient is to continue therapy for 6 months . Patient's next follow up was scheduled for 09/16/21. This appointment was scheduled using wheel and given to patient today along with reminder continue on Vitamin D 800-1000iu and Calcium 1000-1200mg  daily while on Androgen Deprivation Therapy. ? ?PA approval dates:  ?08/25/20-08/25/21 ?

## 2021-03-18 LAB — URINALYSIS, COMPLETE
Bilirubin, UA: NEGATIVE
Glucose, UA: NEGATIVE
Ketones, UA: NEGATIVE
Leukocytes,UA: NEGATIVE
Nitrite, UA: NEGATIVE
Protein,UA: NEGATIVE
RBC, UA: NEGATIVE
Specific Gravity, UA: 1.015 (ref 1.005–1.030)
Urobilinogen, Ur: 0.2 mg/dL (ref 0.2–1.0)
pH, UA: 6 (ref 5.0–7.5)

## 2021-03-18 LAB — MICROSCOPIC EXAMINATION: Bacteria, UA: NONE SEEN

## 2021-03-23 NOTE — Progress Notes (Signed)
Remote pacemaker transmission.   

## 2021-05-01 ENCOUNTER — Other Ambulatory Visit: Payer: Self-pay | Admitting: Internal Medicine

## 2021-06-08 ENCOUNTER — Ambulatory Visit (INDEPENDENT_AMBULATORY_CARE_PROVIDER_SITE_OTHER): Payer: Medicare Other

## 2021-06-08 DIAGNOSIS — I442 Atrioventricular block, complete: Secondary | ICD-10-CM | POA: Diagnosis not present

## 2021-06-09 LAB — CUP PACEART REMOTE DEVICE CHECK
Battery Impedance: 1325 Ohm
Battery Remaining Longevity: 45 mo
Battery Voltage: 2.77 V
Brady Statistic AP VP Percent: 60 %
Brady Statistic AP VS Percent: 1 %
Brady Statistic AS VP Percent: 37 %
Brady Statistic AS VS Percent: 3 %
Date Time Interrogation Session: 20230607130142
Implantable Lead Implant Date: 20150903
Implantable Lead Implant Date: 20150903
Implantable Lead Location: 753859
Implantable Lead Location: 753860
Implantable Lead Model: 5076
Implantable Lead Model: 5076
Implantable Pulse Generator Implant Date: 20150903
Lead Channel Impedance Value: 422 Ohm
Lead Channel Impedance Value: 488 Ohm
Lead Channel Pacing Threshold Amplitude: 0.75 V
Lead Channel Pacing Threshold Amplitude: 0.75 V
Lead Channel Pacing Threshold Pulse Width: 0.4 ms
Lead Channel Pacing Threshold Pulse Width: 0.4 ms
Lead Channel Setting Pacing Amplitude: 2 V
Lead Channel Setting Pacing Amplitude: 2.5 V
Lead Channel Setting Pacing Pulse Width: 0.4 ms
Lead Channel Setting Sensing Sensitivity: 2.8 mV

## 2021-06-17 NOTE — Progress Notes (Signed)
Remote pacemaker transmission.   

## 2021-08-01 ENCOUNTER — Other Ambulatory Visit: Payer: Self-pay | Admitting: Internal Medicine

## 2021-08-01 NOTE — Telephone Encounter (Signed)
Hi, could you schedule a 12 month follow up visit? This patient had a virtual visit with Dr. Caryl Comes on 07-06-20 and he wanted him to follow up in 12 months. Thank you so much.

## 2021-08-03 ENCOUNTER — Other Ambulatory Visit: Payer: Self-pay | Admitting: Internal Medicine

## 2021-08-08 NOTE — Telephone Encounter (Signed)
LVM to schedule 1 yr follow up

## 2021-08-31 ENCOUNTER — Other Ambulatory Visit: Payer: Self-pay | Admitting: Internal Medicine

## 2021-09-07 ENCOUNTER — Telehealth: Payer: Self-pay

## 2021-09-07 ENCOUNTER — Ambulatory Visit (INDEPENDENT_AMBULATORY_CARE_PROVIDER_SITE_OTHER): Payer: Medicare Other

## 2021-09-07 DIAGNOSIS — I442 Atrioventricular block, complete: Secondary | ICD-10-CM | POA: Diagnosis not present

## 2021-09-07 NOTE — Telephone Encounter (Signed)
Eligard approved via [place] # [id number]   Dates: 09/07/21 - 09/08/22

## 2021-09-08 LAB — CUP PACEART REMOTE DEVICE CHECK
Battery Impedance: 1435 Ohm
Battery Remaining Longevity: 42 mo
Battery Voltage: 2.76 V
Brady Statistic AP VP Percent: 64 %
Brady Statistic AP VS Percent: 1 %
Brady Statistic AS VP Percent: 33 %
Brady Statistic AS VS Percent: 2 %
Date Time Interrogation Session: 20230906112931
Implantable Lead Implant Date: 20150903
Implantable Lead Implant Date: 20150903
Implantable Lead Location: 753859
Implantable Lead Location: 753860
Implantable Lead Model: 5076
Implantable Lead Model: 5076
Implantable Pulse Generator Implant Date: 20150903
Lead Channel Impedance Value: 412 Ohm
Lead Channel Impedance Value: 510 Ohm
Lead Channel Pacing Threshold Amplitude: 0.75 V
Lead Channel Pacing Threshold Amplitude: 0.75 V
Lead Channel Pacing Threshold Pulse Width: 0.4 ms
Lead Channel Pacing Threshold Pulse Width: 0.4 ms
Lead Channel Setting Pacing Amplitude: 2 V
Lead Channel Setting Pacing Amplitude: 2.5 V
Lead Channel Setting Pacing Pulse Width: 0.4 ms
Lead Channel Setting Sensing Sensitivity: 2.8 mV

## 2021-09-16 ENCOUNTER — Other Ambulatory Visit: Payer: Medicare Other

## 2021-09-16 DIAGNOSIS — C61 Malignant neoplasm of prostate: Secondary | ICD-10-CM

## 2021-09-20 ENCOUNTER — Ambulatory Visit (INDEPENDENT_AMBULATORY_CARE_PROVIDER_SITE_OTHER): Payer: Medicare Other | Admitting: Urology

## 2021-09-20 VITALS — BP 181/65 | HR 65 | Ht 66.0 in | Wt 175.0 lb

## 2021-09-20 DIAGNOSIS — Z8551 Personal history of malignant neoplasm of bladder: Secondary | ICD-10-CM | POA: Diagnosis not present

## 2021-09-20 DIAGNOSIS — R3129 Other microscopic hematuria: Secondary | ICD-10-CM | POA: Diagnosis not present

## 2021-09-20 DIAGNOSIS — C61 Malignant neoplasm of prostate: Secondary | ICD-10-CM

## 2021-09-20 LAB — MICROSCOPIC EXAMINATION: RBC, Urine: >30 /hpf — AB (ref 0–2)

## 2021-09-20 LAB — URINALYSIS, COMPLETE
Bilirubin, UA: NEGATIVE
Glucose, UA: NEGATIVE
Ketones, UA: NEGATIVE
Leukocytes,UA: NEGATIVE
Nitrite, UA: NEGATIVE
Specific Gravity, UA: 1.02 (ref 1.005–1.030)
Urobilinogen, Ur: 0.2 mg/dL (ref 0.2–1.0)
pH, UA: 5.5 (ref 5.0–7.5)

## 2021-09-20 MED ORDER — LEUPROLIDE ACETATE (6 MONTH) 45 MG ~~LOC~~ KIT
45.0000 mg | PACK | Freq: Once | SUBCUTANEOUS | Status: AC
  Administered 2021-09-20: 45 mg via SUBCUTANEOUS

## 2021-09-20 NOTE — Progress Notes (Signed)
09/20/2021 10:45 AM   Jacob [name] [birth year] [id number]  Referring provider: [name], MD [place],  [place] [address]  Chief Complaint  Patient presents with   Follow-up    HPI: 88 year old male with multiple GU issues who returns today for 6 months with PSA.  He was diagnosed with high risk prostate cancer in 2020. He underwent prostate biopsy on 06/04/2018  for evaluation of rising PSA with a PSA as high as 10.6 in 04/2018.In addition to this, he had patchy nonfocal uptake on PET on 09/2017. Surgical pathology showed  7 of 12 positive for prostate cancer involving cores on the left up to 100% of Gleason 4+5 and 4+3 as well as a single core of Gleason 3+3 at the right lateral base. T2b, iPSA 10. He elected to undergo ADT only given his advanced age and comorbidities. His most recent  injection was on 08/26/2020. His most recent PSA on 03/15/2021 was undetectable.    PSA today is pending.  6 mo depo eligard given today.    He is s/p nephroureterectomy in 08/2013 for large volume pTaG3 renal pelvis cancer with negative margins by Dr. [name]. 02/2016 CT negative apart from 7 mm pulm nodule    He also is s/p TaG3 TURBT in 2012 for bladder cancer with induction BCG x6 with last recurrence being in 2016 he underwent repeat TaG3 TURBT with re-induction BCG x6.  He has elected to undergo no further surveillance imaging for this.     Today, he has greater than 30 RBCs in his urine, otherwise unremarkable.  He denies any gross hematuria.  Denies any urinary issues other than some occasional urinary frequency.  He is no longer taking OAB medications due to financial concerns and minimal bother.  No weight loss, appetite changes or any other symptoms.    PMH: Past Medical History:  Diagnosis Date   Arthritis    hands   At risk for sleep apnea    STOP-BANG=  4     SENT TO PCP 06-19-5013   BPH (benign prostatic hypertrophy)    Coronary artery disease cardiologist-- dr  Fletcher Anon  and dr gregg taylor   a. 2006 s/p PCI/Stenting x 2 to the RCA;  b. 05/03/12 Cardiac CT: Patent RCA stent, nonobs dzs;  c. 01/2013 Ex MV: nl EF, no ischemia.   GERD (gastroesophageal reflux disease)    Glaucoma of both eyes    H/O hiatal hernia    History of atrial fibrillation    a. 2006.   History of diverticulitis    History of kidney stones    History of renal cell carcinoma    08-22-2013  s/p  left nephrouretectomy   Hyperlipidemia    Hyperlipidemia    Hypertension    LBBB (left bundle branch block)    Mobitz type 2 second degree AV block    Recurrent bladder transitional cell carcinoma ([place]) monitored by dr [name]   s/p turb's  and chemo bladder instillations   Renal insufficiency    a. Creat rose to 2.34 post-op L nephrectomy.   S/P placement of cardiac pacemaker MEDTRONIC last pacer check 12-01-2013   08-29-2013  for symptomatic bradycardia and mobitz 2 second degree heart block   Symptomatic bradycardia    s/p  pacemaker placement 08-29-2013    Surgical History: Past Surgical History:  Procedure Laterality Date   APPENDECTOMY  02/1988   CATARACT EXTRACTION W/ INTRAOCULAR LENS  IMPLANT, BILATERAL  2011   CORONARY ANGIOPLASTY  WITH STENT PLACEMENT  2006    [facility]   X2 STENTS  TO RCA   CYSTOSCOPY W/ RETROGRADES Right 01/13/2014   Procedure: CYSTOSCOPY WITH RETROGRADE PYELOGRAM;  Surgeon: [name], MD;  Location: [facility];  Service: Urology;  Laterality: Right;   CYSTOSCOPY W/ URETERAL STENT PLACEMENT Left 08/22/2013   Procedure: CYSTOSCOPY WITH RETROGRADE PYELOGRAM/URETERAL STENT PLACEMENT;  Surgeon: [name], MD;  [facility];  Service: Urology;  Laterality: Left;   CYSTOSCOPY WITH BIOPSY  04/13/2011     Procedure: CYSTOSCOPY WITH BIOPSY;  Surgeon: [name], MD;  [facility];  Service: Urology;  Laterality: N/A;  Cold Cup Biopsy   CYSTOSCOPY WITH RETROGRADE PYELOGRAM, URETEROSCOPY AND STENT PLACEMENT Left 06/19/2013    Procedure: CYSTOSCOPY WITH LEFT RETROGRADE PYELOGRAM, LEFT URETEROSCOPY, ureteral balloon dilatation, BIOPSY LEFT KIDNEY;  Surgeon: [name], MD;  Location: [facility];  Service: Urology;  Laterality: Left;   INGUINAL HERNIA REPAIR Left 08/22/2013   Procedure: LAPAROSCOPIC INGUINAL HERNIA;  Surgeon: [name], MD;  [facility];  Service: Urology;  Laterality: Left;   IR KYPHO LUMBAR INC FX REDUCE BONE BX UNI/BIL CANNULATION INC/IMAGING  01/31/2021   PERMANENT PACEMAKER INSERTION N/A 08/29/2013   Procedure: PERMANENT PACEMAKER INSERTION;  Surgeon: [name], MD;  [facility];  Service: Cardiovascular;  Laterality: N/A;  Medtronic dual-chamber   ROBOT ASSITED LAPAROSCOPIC NEPHROURETERECTOMY Left 08/22/2013   Procedure: ROBOT ASSITED LAPAROSCOPIC NEPHROURETERECTOMY;  Surgeon: [name], MD;  [facility];  Service: Urology;  Laterality: Left;   TRANSTHORACIC ECHOCARDIOGRAM  08-29-2013   mild LVH/  ef 60-65% /  grade I diastolic dysfunction/  mild MR/  mild LAE  and RAE/  moderate TR   TRANSURETHRAL RESECTION OF BLADDER TUMOR  12-22-2010   TRANSURETHRAL RESECTION OF BLADDER TUMOR  08/04/2011   Procedure: TRANSURETHRAL RESECTION OF BLADDER TUMOR (TURBT);  Surgeon: [name], MD;  Location: [facility];  Service: Urology;  Laterality: N/A;   TRANSURETHRAL RESECTION OF BLADDER TUMOR N/A 01/13/2014   Procedure: TRANSURETHRAL RESECTION OF BLADDER TUMOR (TURBT);  Surgeon: [name], MD;  Location: [facility];  Service: Urology;  Laterality: N/A;    Home Medications:  Allergies as of 09/20/2021       Reactions   Penicillins Hives   BLISTERS        Medication List        Accurate as of September 20, 2021 10:45 AM. If you have any questions, ask your nurse or doctor.          STOP taking these medications    FeroSul 325 (65 FE) MG tablet Generic drug: ferrous sulfate   oxyCODONE 5 MG  immediate release tablet Commonly known as: Roxicodone       TAKE these medications    acetaminophen 325 MG tablet Commonly known as: TYLENOL Take 650 mg by mouth every 4 (four) hours as needed.   amLODipine 5 MG tablet Commonly known as: NORVASC Take 1 tablet (5 mg total) by mouth daily.   aspirin 81 MG tablet Take 81 mg by mouth daily.   atorvastatin 40 MG tablet Commonly known as: LIPITOR TAKE 1 TABLET BY MOUTH ONCE DAILY. STOP LOVASTATIN. APPOINTMENT REQUIRED FOR FUTURE REFILLS   cetirizine 10 MG tablet Commonly known as: ZYRTEC Take 10 mg by mouth daily.   esomeprazole 20 MG capsule Commonly known as: NEXIUM Take 20 mg by mouth daily.   Fish Oil 1200 MG  Caps Take 1,200 mg by mouth daily.   Gemtesa 75 MG Tabs Generic drug: Vibegron Take 75 mg by mouth daily.   isosorbide mononitrate 30 MG 24 hr tablet Commonly known as: IMDUR Take 30 mg by mouth at bedtime.   Lactulose 20 GM/30ML Soln Take 30 mLs (20 g total) by mouth daily as needed.   latanoprost 0.005 % ophthalmic solution Commonly known as: XALATAN Place 1 drop into both eyes at bedtime.   multivitamins ther. w/minerals Tabs tablet Take 1 tablet by mouth daily.   ProAir HFA 108 (90 Base) MCG/ACT inhaler Generic drug: albuterol Inhale 1 puff into the lungs every 4 (four) hours as needed.   senna-docusate 8.6-50 MG tablet Commonly known as: Senokot-S Take 1 tablet by mouth 2 (two) times daily. While taking pain meds to prevent constipation   Symbicort 160-4.5 MCG/ACT inhaler Generic drug: budesonide-formoterol INHALE 2 PUFFS INTO LUNGS TWICE DAILY. RINSE MOUTH AFTER USE.        Allergies:  Allergies  Allergen Reactions   Penicillins Hives    BLISTERS    Family History: Family History  Problem Relation Age of Onset   Heart attack Mother    Heart attack Father    Prostate cancer Neg Hx    Bladder Cancer Neg Hx    Kidney cancer Neg Hx     Social History:  reports that he quit  smoking about 20 years ago. His smoking use included cigarettes. He has a 50.00 pack-year smoking history. He has never used smokeless tobacco. He reports that he does not drink alcohol and does not use drugs.   Physical Exam: BP (!) 181/65   Pulse 65   Ht 5\' 6"  (1.676 m)   Wt 175 lb (79.4 kg)   BMI 28.25 kg/m   Constitutional:  Alert and oriented, No acute distress.  In wheel chair, elderly but spry. HEENT: [place] AT, moist mucus membranes.  Trachea midline, no masses. Neurologic: Grossly intact, no focal deficits, moving all 4 extremities. Psychiatric: Normal mood and affect.  Laboratory Data: Lab Results  Component Value Date   WBC 7.7 02/01/2021   HGB 9.3 (L) 02/01/2021   HCT 29.3 (L) 02/01/2021   MCV 95.4 02/01/2021   PLT 188 02/01/2021    Lab Results  Component Value Date   CREATININE 1.95 (H) 02/01/2021    Urinalysis >30 RBC/ HPF otherwise negative   Assessment & Plan:    1. Prostate cancer ([place]) Treated on ADT alone for high risk prostate cancer  His PSA today is pending  We discussed today the option of consideration of intermittent ADT, primarily used for palliation.  He is interested in doing this and may resume ADT if his PSA starts to trend back upwards.  He did get a 78-month Depo today, we will hold off on further ADT for the time being but continue to trend his PSA at least every 6 months.  He is agreeable this plan. - leuprolide (6 Month) (ELIGARD) injection 45 mg - Urinalysis, Complete  2. History of bladder cancer Personal history of upper tract urothelial cancer as well as bladder cancer, has previously declined cystoscopy   In light of his increasing degree of microscopic hematuria, today he agrees to proceed with cystoscopy.  He does think that if he had a recurrent bladder cancer, he would consider TURBT at this point. - Urinalysis, Complete  3. Microscopic hematuria As above  Most recent cross-sectional imaging in the form of CT abdomen  pelvis on 01/25/2021, noncontrast  given CKD.  Kidney was unremarkable.  Prostamegaly was noted.  Left kidney was surgically absent.+   Return for cysto next availible.  [name], MD  [facility] [address] [place], [place] [address] [phone]

## 2021-09-21 LAB — PSA: Prostate Specific Ag, Serum: <0.1 ng/mL (ref 0.0–4.0)

## 2021-09-22 ENCOUNTER — Telehealth: Payer: Self-pay

## 2021-09-22 NOTE — Telephone Encounter (Signed)
Left message for patient to call back regarding PSA results.

## 2021-09-22 NOTE — Telephone Encounter (Signed)
-----   Message from [name], MD sent at 09/22/2021 12:41 PM EDT ----- PSA undetectable.  Great!  [name], MD

## 2021-09-22 NOTE — Telephone Encounter (Signed)
Patient called back and given results.

## 2021-09-26 NOTE — Progress Notes (Signed)
Remote pacemaker transmission.   

## 2021-09-27 ENCOUNTER — Ambulatory Visit: Payer: Medicare Other | Attending: Internal Medicine | Admitting: Internal Medicine

## 2021-09-27 ENCOUNTER — Encounter: Payer: Self-pay | Admitting: Internal Medicine

## 2021-09-27 VITALS — BP 160/70 | HR 63 | Ht 66.0 in | Wt 174.4 lb

## 2021-09-27 DIAGNOSIS — I442 Atrioventricular block, complete: Secondary | ICD-10-CM

## 2021-09-27 DIAGNOSIS — I10 Essential (primary) hypertension: Secondary | ICD-10-CM

## 2021-09-27 DIAGNOSIS — R001 Bradycardia, unspecified: Secondary | ICD-10-CM

## 2021-09-27 DIAGNOSIS — Z950 Presence of cardiac pacemaker: Secondary | ICD-10-CM | POA: Diagnosis not present

## 2021-09-27 LAB — CUP PACEART INCLINIC DEVICE CHECK
Battery Impedance: 1464 Ohm
Battery Remaining Longevity: 41 mo
Battery Voltage: 2.77 V
Brady Statistic AP VP Percent: 64 %
Brady Statistic AP VS Percent: 1 %
Brady Statistic AS VP Percent: 33 %
Brady Statistic AS VS Percent: 2 %
Date Time Interrogation Session: 20230926120600
Implantable Lead Implant Date: 20150903
Implantable Lead Implant Date: 20150903
Implantable Lead Location: 753859
Implantable Lead Location: 753860
Implantable Lead Model: 5076
Implantable Lead Model: 5076
Implantable Pulse Generator Implant Date: 20150903
Lead Channel Impedance Value: 406 Ohm
Lead Channel Impedance Value: 485 Ohm
Lead Channel Pacing Threshold Amplitude: 0.75 V
Lead Channel Pacing Threshold Amplitude: 0.75 V
Lead Channel Pacing Threshold Pulse Width: 0.4 ms
Lead Channel Pacing Threshold Pulse Width: 0.4 ms
Lead Channel Sensing Intrinsic Amplitude: 2 mV
Lead Channel Setting Pacing Amplitude: 2 V
Lead Channel Setting Pacing Amplitude: 2.5 V
Lead Channel Setting Pacing Pulse Width: 0.4 ms
Lead Channel Setting Sensing Sensitivity: 2.8 mV

## 2021-09-27 MED ORDER — EMPAGLIFLOZIN 10 MG PO TABS: ORAL_TABLET | ORAL | 6 refills | Status: DC

## 2021-09-27 MED ORDER — AMLODIPINE BESYLATE 5 MG PO TABS: 5.0000 mg | ORAL_TABLET | Freq: Every day | ORAL | Status: DC

## 2021-09-27 MED ORDER — AMLODIPINE BESYLATE 10 MG PO TABS: ORAL_TABLET | ORAL | 3 refills | Status: DC

## 2021-09-27 NOTE — Progress Notes (Unsigned)
Patient Care Team: [name], MD as PCP - General (Internal Medicine)   HPI  Jacob [name] is a 88 y.o. male Seen in follow-up for a pacemaker implanted 8/15 by Dr. Elliot Cousin for symptomatic bradycardia and 2-1 heart block.  He had an antecedent history of left bundle branch block and coronary artery disease with stents x 2 at [facility] 2006--prev preserved LV function   2022 c/o worsening dyspnea and vague chest pain, .>> eval  Discordant data (See Below)   retaining preserved LV function   DATE TEST EF   2014 [facility])  "patent RCA stents"  2/15 Myoview  "suspicious for ischemia" Dr MA "no...ischemia"  8/15 Echo   55-65 %   3/17 Echo (CE)  55%   6/22 MYOVIEW  20% No ischemia  6/22 Echo  50-55%    He has a history of atrial fibrillation and hypertension. no atrial fibrillation however has been recorded on his device On ASA.     The patient denies chest pain, nocturnal dyspnea, orthopnea or peripheral edema.  There have been no palpitations, lightheadedness or syncope.  Complains of exertional fatigue and dyspnea.    He has a history of renal cell carcinoma for which he underwent nephrectomy.  Modest renal insufficiency followed by nephrology   Date Cr K LDL Hgb  3/18 2.27 4.8  12.9   1/19 1.92 5.0  13.1  3/22 2.12 5.1  10.6 (12/2099-30-24 1.35 4.6  9.3       Past Medical History:  Diagnosis Date   Arthritis    hands   At risk for sleep apnea    STOP-BANG=  4     SENT TO PCP [id number]   BPH (benign prostatic hypertrophy)    Coronary artery disease cardiologist-- dr Fletcher Anon  and dr gregg taylor   a. 2006 s/p PCI/Stenting x 2 to the RCA;  b. 05/03/12 Cardiac CT: Patent RCA stent, nonobs dzs;  c. 01/2013 Ex MV: nl EF, no ischemia.   GERD (gastroesophageal reflux disease)    Glaucoma of both eyes    H/O hiatal hernia    History of atrial fibrillation    a. 2006.   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 Creat rose to 2.34 post-op L nephrectomy.   S/P placement of cardiac pacemaker MEDTRONIC last pacer check 12-01-2013   08-29-2013  for symptomatic bradycardia and mobitz 2 second degree heart block   Symptomatic bradycardia    s/p  pacemaker placement 08-29-2013    Past Surgical History:  Procedure Laterality Date   APPENDECTOMY  02/1988   CATARACT EXTRACTION W/ INTRAOCULAR LENS  IMPLANT, BILATERAL  2011   CORONARY ANGIOPLASTY WITH STENT PLACEMENT  2006    [facility]   X2 STENTS  TO RCA   CYSTOSCOPY W/ RETROGRADES Right 01/13/2014   Procedure: CYSTOSCOPY WITH RETROGRADE PYELOGRAM;  Surgeon: [name], MD;  Location: [facility];  Service: Urology;  Laterality: Right;   CYSTOSCOPY W/ URETERAL STENT PLACEMENT Left 08/22/2013  Procedure: CYSTOSCOPY WITH RETROGRADE PYELOGRAM/URETERAL STENT PLACEMENT;  Surgeon: [name], MD;  Location: [facility];  Service: Urology;  Laterality: Left;   CYSTOSCOPY WITH BIOPSY  04/13/2011     Procedure: CYSTOSCOPY WITH BIOPSY;  Surgeon: [name], MD;  Location: [facility];  Service: Urology;  Laterality: N/A;  Cold Cup Biopsy   CYSTOSCOPY WITH RETROGRADE PYELOGRAM, URETEROSCOPY AND STENT PLACEMENT Left 06/19/2013   Procedure: CYSTOSCOPY WITH LEFT RETROGRADE PYELOGRAM, LEFT URETEROSCOPY, ureteral balloon dilatation, BIOPSY LEFT KIDNEY;  Surgeon: [name], MD;  Location[facility];  Service: Urology;  Laterality: Left;   INGUINAL HERNIA REPAIR Left 08/22/2013   Procedure: LAPAROSCOPIC INGUINAL HERNIA;  Surgeon: [name], MD;  Location: [facility];  Service: Urology;  Laterality: Left;   IR  KYPHO LUMBAR INC FX REDUCE BONE BX UNI/BIL CANNULATION INC/IMAGING  01/31/2021   PERMANENT PACEMAKER INSERTION N/A 08/29/2013   Procedure: PERMANENT PACEMAKER INSERTION;  Surgeon: [name], MD;  Location: [facility];  Service: Cardiovascular;  Laterality: N/A;  Medtronic dual-chamber   ROBOT ASSITED LAPAROSCOPIC NEPHROURETERECTOMY Left 08/22/2013   Procedure: ROBOT ASSITED LAPAROSCOPIC NEPHROURETERECTOMY;  Surgeon: [name], MD;  Location: [facility];  Service: Urology;  Laterality: Left;   TRANSTHORACIC ECHOCARDIOGRAM  08-29-2013   mild LVH/  ef 60-65% /  grade I diastolic dysfunction/  mild MR/  mild LAE  and RAE/  moderate TR   TRANSURETHRAL RESECTION OF BLADDER TUMOR  12-22-2010   TRANSURETHRAL RESECTION OF BLADDER TUMOR  08/04/2011   Procedure: TRANSURETHRAL RESECTION OF BLADDER TUMOR (TURBT);  Surgeon: [name], MD;  Location: [facility];  Service: Urology;  Laterality: N/A;   TRANSURETHRAL RESECTION OF BLADDER TUMOR N/A 01/13/2014   Procedure: TRANSURETHRAL RESECTION OF BLADDER TUMOR (TURBT);  Surgeon: [name], MD;  [facility];  Service: Urology;  Laterality: N/A;    Current Outpatient Medications  Medication Sig Dispense Refill   acetaminophen (TYLENOL) 325 MG tablet Take 650 mg by mouth every 4 (four) hours as needed.     amLODipine (NORVASC) 10 MG tablet Take 1 tablet (10 mg) by mouth once daily 90 tablet 3   aspirin 81 MG tablet Take 81 mg by mouth daily.     atorvastatin (LIPITOR) 40 MG tablet TAKE 1 TABLET BY MOUTH ONCE DAILY. STOP LOVASTATIN. APPOINTMENT REQUIRED FOR FUTURE REFILLS 30 tablet 0   Budeson-Glycopyrrol-Formoterol (BREZTRI AEROSPHERE) 160-9-4.8 MCG/ACT AERO Inhale into the lungs in the morning and at bedtime.     cetirizine (ZYRTEC) 10 MG tablet Take 10 mg by mouth daily.     esomeprazole (NEXIUM) 20 MG capsule Take 20 mg by mouth daily.     isosorbide mononitrate (IMDUR) 30 MG 24 hr tablet Take 30 mg by  mouth at bedtime.     Lactulose 20 GM/30ML SOLN Take 30 mLs (20 g total) by mouth daily as needed. 450 mL    latanoprost (XALATAN) 0.005 % ophthalmic solution Place 1 drop into both eyes at bedtime.     Multiple Vitamins-Minerals (MULTIVITAMINS THER. W/MINERALS) TABS Take 1 tablet by mouth daily.     Omega-3 Fatty Acids (FISH OIL) 1200 MG CAPS Take 1,200 mg by mouth daily.     PROAIR HFA 108 (90 Base) MCG/ACT inhaler Inhale 1 puff into the lungs every 4 (four) hours as needed.      senna-docusate (SENOKOT-S) 8.6-50 MG per tablet Take 1 tablet by mouth 2 (two) times daily. While taking pain meds to prevent constipation 30  tablet 0   Vibegron (GEMTESA) 75 MG TABS Take 75 mg by mouth daily. 30 tablet 11   No current facility-administered medications for this visit.    Allergies  Allergen Reactions   Penicillins Hives    BLISTERS      Review of Systems negative except from HPI and PMH  Physical Exam BP (!) 182/68   Pulse 63   Ht 5\' 6"  (1.676 m)   Wt 174 lb 6 oz (79.1 kg)   SpO2 98%   BMI 28.14 kg/m  Well developed and well nourished in no acute distress HENT normal Neck supple with JVP-flat Clear Device pocket well healed; without hematoma or erythema.  There is no tethering  Regular rate and rhythm, no  gallop No / murmur Abd-soft with active BS No Clubbing cyanosis tr edema Skin-warm and dry A & Oriented  Grossly normal sensory and motor function  ECG AV pacing at 63 Intervals 20/17/47 PVCs-frequent  Assessment and  Plan  Pacemaker-Medtronic      Symptomatic bradycardia   Complete heart block  Ventricular tachycardia-nonsustained/PVCs frequent  Hypertension   COPD-CT 3/21 shows "moderate centrilobular emphysema again noted "  Dyspnea on exertion  Coronary artery disease with prior stenting  Nonexertional chest pain  Orthostatic lightheadedness  Continues with exercise intolerance.  With leg discomfort upon standing, and with initial motion, I wonder  whether does not have neurogenic claudication.  I will ask him to follow-up with Dr. Marty Heck about whether there is a role for neurosurgical evaluation for the possibility of a steroid injection.  Blood pressure was exceedingly high on repeat.  Wonders whether it is related to the need to urinate.  We will recheck  >>160  Orthostatic intolerance has been significant in the past and so would need to allow his systolics in the 315 60 range.  With dyspnea in the context of his heart disease and near normal LV function, discussed the use of an SGLT2 including the concerns related to glycosuria and infection.  We will start him on 5 mg.  He is to see Dr. Lamonte Richer in about 1 week.  If he is tolerating this dose he can go up to 10 mg a day.  Device function is normal. Programming changes none   See Paceart for details

## 2021-09-27 NOTE — Patient Instructions (Addendum)
Medication Instructions:  - Your physician has recommended you make the following change in your medication:   1) START Jardiance 10 mg: - take 0.5 tablet (5 mg) by mouth once daily (until seen by Dr. Rosario Jacks)   Samples Given: Jardiance 10 mg Lot: [id number] Exp: Jan 2025 # 1 box    *If you need a refill on your cardiac medications before your next appointment, please call your pharmacy*   Lab Work: - none ordered  If you have labs (blood work) drawn today and your tests are completely normal, you will receive your results only by: Dasher (if you have MyChart) OR A paper copy in the mail If you have any lab test that is abnormal or we need to change your treatment, we will call you to review the results.   Testing/Procedures: - none ordered   Follow-Up: At [facility], you and your health needs are our priority.  As part of our continuing mission to provide you with exceptional heart care, we have created designated Provider Care Teams.  These Care Teams include your primary Cardiologist (physician) and Advanced Practice Providers (APPs -  Physician Assistants and Nurse Practitioners) who all work together to provide you with the care you need, when you need it.  We recommend signing up for the patient portal called "MyChart".  Sign up information is provided on this After Visit Summary.  MyChart is used to connect with patients for Virtual Visits (Telemedicine).  Patients are able to view lab/test results, encounter notes, upcoming appointments, etc.  Non-urgent messages can be sent to your provider as well.   To learn more about what you can do with MyChart, go to [email or web address].    Your next appointment:   1 year(s)  The format for your next appointment:   In Person  Provider:   Virl Axe, MD    Other Instructions - n/a  Important Information About Sugar

## 2021-09-29 NOTE — Addendum Note (Signed)
Addended by: Britt Bottom on: 09/29/2021 04:26 PM   Modules accepted: Orders

## 2021-10-03 ENCOUNTER — Other Ambulatory Visit: Payer: Self-pay | Admitting: Internal Medicine

## 2021-10-04 ENCOUNTER — Other Ambulatory Visit: Payer: Medicare Other | Admitting: Urology

## 2021-10-05 ENCOUNTER — Other Ambulatory Visit: Payer: Self-pay | Admitting: Urology

## 2021-10-05 ENCOUNTER — Ambulatory Visit (INDEPENDENT_AMBULATORY_CARE_PROVIDER_SITE_OTHER): Payer: Medicare Other | Admitting: Urology

## 2021-10-05 ENCOUNTER — Telehealth: Payer: Self-pay

## 2021-10-05 DIAGNOSIS — Z8551 Personal history of malignant neoplasm of bladder: Secondary | ICD-10-CM | POA: Diagnosis not present

## 2021-10-05 DIAGNOSIS — D494 Neoplasm of unspecified behavior of bladder: Secondary | ICD-10-CM

## 2021-10-05 DIAGNOSIS — C61 Malignant neoplasm of prostate: Secondary | ICD-10-CM

## 2021-10-05 DIAGNOSIS — C679 Malignant neoplasm of bladder, unspecified: Secondary | ICD-10-CM

## 2021-10-05 NOTE — Telephone Encounter (Signed)
Refill sent to pharmacy with message patient needs appointment for future refills / 2nd attempt

## 2021-10-05 NOTE — Telephone Encounter (Signed)
I spoke with Jacob [name]. We have discussed possible surgery dates and Monday October 16th, 2023 was agreed upon by all parties. Patient given information about surgery date, what to expect pre-operatively and post operatively.  We discussed that a Pre-Admission Testing office will be calling to set up the pre-op visit that will take place prior to surgery, and that these appointments are typically done over the phone with a Pre-Admissions RN.  Informed patient that our office will communicate any additional care to be provided after surgery. Patients questions or concerns were discussed during our call. Advised to call our office should there be any additional information, questions or concerns that arise. Patient verbalized understanding.

## 2021-10-05 NOTE — Progress Notes (Signed)
   10/05/21  CC:  Chief Complaint  Patient presents with   Cysto    HPI: 88 year old male with personal history of bladder cancer upper tract TCC who presents today for cystoscopy in the setting of worsening microscopic hematuria.  He is s/p nephroureterectomy in 08/2013 for large volume pTaG3 renal pelvis cancer with negative margins by Dr. [name]. 02/2016 CT negative apart from 7 mm pulm nodule    He also is s/p TaG3 TURBT in 2012 for bladder cancer with induction BCG x6 with last recurrence being in 2016 he underwent repeat TaG3 TURBT with re-induction BCG x6.  He has elected to undergo no further surveillance imaging for this.      Last visit, has greater than 30 RBCs in his urine, otherwise unremarkable.   NED. A&Ox3.   No respiratory distress   Abd soft, NT, ND Normal phallus with bilateral descended testicles  Cystoscopy Procedure Note  Patient identification was confirmed, informed consent was obtained, and patient was prepped using Betadine solution.  Lidocaine jelly was administered per urethral meatus.     Pre-Procedure: - Inspection reveals a normal caliber ureteral meatus.  Procedure: The flexible cystoscope was introduced without difficulty - No urethral strictures/lesions are present. - Enlarged prostate with trilobar coaptation - Elevated bladder neck - Bilateral ureteral orifices identified - Bladder mucosa  reveals 3 round papillary tumors on the left lateral wall, largest 1 cm - No bladder stones - No trabeculation  Retroflexion unremarkable   Post-Procedure: - Patient tolerated the procedure well  Assessment/ Plan:  1. Bladder tumor 3 small bladder tumors consistent with local what appears to be superficial recurrence  We discussed options including surveillance versus TURBT, left retrograde pyelogram and intravesical gemcitabine.  Given that these are relatively small but have the possibility of continue to grow, bleed and cause other urinary  issues, would Meaux strongly recommend treatment.  We discussed the risk including risk of bleeding, infection, damage surrounding structures amongst others.  All questions were answered.  He would like to proceed as planned.   2. History of bladder cancer As above - Urinalysis, Complete - CULTURE, URINE COMPREHENSIVE  3. Prostate cancer ([place]) Intermittent ADT, will need PSA again in 6 months - PSA; Future   Return for PSA lab only in 6 months, PSA with MD in a year.  [name], MD

## 2021-10-05 NOTE — H&P (View-Only) (Signed)
   10/05/21  CC:  Chief Complaint  Patient presents with   Cysto    HPI: 88 year old male with personal history of bladder cancer upper tract TCC who presents today for cystoscopy in the setting of worsening microscopic hematuria.  He is s/p nephroureterectomy in 08/2013 for large volume pTaG3 renal pelvis cancer with negative margins by Dr. [name]. 02/2016 CT negative apart from 7 mm pulm nodule    He also is s/p TaG3 TURBT in 2012 for bladder cancer with induction BCG x6 with last recurrence being in 2016 he underwent repeat TaG3 TURBT with re-induction BCG x6.  He has elected to undergo no further surveillance imaging for this.      Last visit, has greater than 30 RBCs in his urine, otherwise unremarkable.   NED. A&Ox3.   No respiratory distress   Abd soft, NT, ND Normal phallus with bilateral descended testicles  Cystoscopy Procedure Note  Patient identification was confirmed, informed consent was obtained, and patient was prepped using Betadine solution.  Lidocaine jelly was administered per urethral meatus.     Pre-Procedure: - Inspection reveals a normal caliber ureteral meatus.  Procedure: The flexible cystoscope was introduced without difficulty - No urethral strictures/lesions are present. - Enlarged prostate with trilobar coaptation - Elevated bladder neck - Bilateral ureteral orifices identified - Bladder mucosa  reveals 3 round papillary tumors on the left lateral wall, largest 1 cm - No bladder stones - No trabeculation  Retroflexion unremarkable   Post-Procedure: - Patient tolerated the procedure well  Assessment/ Plan:  1. Bladder tumor 3 small bladder tumors consistent with local what appears to be superficial recurrence  We discussed options including surveillance versus TURBT, left retrograde pyelogram and intravesical gemcitabine.  Given that these are relatively small but have the possibility of continue to grow, bleed and cause other urinary  issues, would Meaux strongly recommend treatment.  We discussed the risk including risk of bleeding, infection, damage surrounding structures amongst others.  All questions were answered.  He would like to proceed as planned.   2. History of bladder cancer As above - Urinalysis, Complete - CULTURE, URINE COMPREHENSIVE  3. Prostate cancer ([place]) Intermittent ADT, will need PSA again in 6 months - PSA; Future   Return for PSA lab only in 6 months, PSA with MD in a year.  [name], MD

## 2021-10-05 NOTE — Progress Notes (Unsigned)
Surgical Physician Order Form [facility] [place]  * Scheduling expectation : Next Available  *Length of Case:   *Clearance needed: yes, cardiology  *Anticoagulation Instructions: Hold all anticoagulants  *Aspirin Instructions: Ok to continue Aspirin  *Post-op visit Date/Instructions:   3 months cystoscopy  *Diagnosis:  Bladder cancer  *Procedure: left retrograde pyelogram, instillation of intravesical gemcitabine TURBT <2cm (01586)   Additional orders: Gemcitabine 2000mg  bladder instillation  -Admit type: OUTpatient  -Anesthesia: General  -VTE Prophylaxis Standing Order SCD's       Other:   -Standing Lab Orders Per Anesthesia    Lab other: None  -Standing Test orders EKG/Chest x-ray per Anesthesia       Test other:   - Medications:  Ancef 2gm IV  -Other orders:  N/A

## 2021-10-06 ENCOUNTER — Telehealth: Payer: Self-pay | Admitting: *Deleted

## 2021-10-06 ENCOUNTER — Other Ambulatory Visit: Payer: Self-pay | Admitting: Internal Medicine

## 2021-10-06 LAB — URINALYSIS, COMPLETE
Bilirubin, UA: NEGATIVE
Ketones, UA: NEGATIVE
Leukocytes,UA: NEGATIVE
Nitrite, UA: NEGATIVE
Specific Gravity, UA: 1.01 (ref 1.005–1.030)
Urobilinogen, Ur: 0.2 mg/dL (ref 0.2–1.0)
pH, UA: 5.5 (ref 5.0–7.5)

## 2021-10-06 LAB — MICROSCOPIC EXAMINATION: Bacteria, UA: NONE SEEN

## 2021-10-06 NOTE — Progress Notes (Signed)
[facility] Posting Form   Surgery Date/Time: Date: 10/17/2021  Surgeon: Dr. [name], MD  Surgery Location: Day Surgery  Inpt ( No  )   Outpt (Yes)   Obs ( No  )   Diagnosis: Bladder Cancer C67.9   -CPT: 34621, 94712, [phone]  Surgery: Transurethral Resection of Bladder Tumor with Left Retrograde pyelogram and intravesical instillation of Gemcitabine  Stop Anticoagulations: Yes, may continue ASA   Cardiac/Medical/Pulmonary Clearance needed: yes  Clearance needed from Dr: Caryl Comes  Clearance request sent on: Date: 10/06/21, Has a Pacemaker as well.   *Orders entered into EPIC  Date: 10/06/21   *Case booked in EPIC  Date: 10/05/2021  *Notified pt of Surgery: Date: 10/05/2021  PRE-OP UA & CX: no  *Placed into Prior Authorization Work Que Date: 10/06/21   Assistant/laser/rep:No

## 2021-10-06 NOTE — Telephone Encounter (Signed)
     Primary Cardiologist: Virl Axe, MD  Chart reviewed as part of pre-operative protocol coverage. Given past medical history and time since last visit, based on ACC/AHA guidelines, Jacob [name] would be at acceptable risk for the planned procedure without further cardiovascular testing.   Patient's aspirin was prescribed by a noncardiology provider.  Recommendations for holding aspirin will need to come from prescribing provider.   I will route this recommendation to the requesting party via Epic fax function and remove from pre-op pool.  Please call with questions.  Jossie Ng. Chrisma Hurlock NP-C     10/06/2021, 2:54 PM [place] [address] Office [phone] Fax [phone]

## 2021-10-06 NOTE — Telephone Encounter (Signed)
-----   Message from Karen Kitchens, NP sent at 10/06/2021  2:23 PM EDT ----- Regarding: Request for pre-operative cardiac clearance Request for pre-operative cardiac clearance:  1. What type of surgery is being performed?  TRANSURETHRAL RESECTION OF BLADDER TUMOR (TURBT); CYSTOSCOPY WITH RETROGRADE PYELOGRAM; BLADDER INSTILLATION OF GEMCITABINE    2. When is this surgery scheduled?  10/17/2021  3. Type of clearance being requested (medical, pharmacy, both). BOTH   4. Are there any medications that need to be held prior to surgery? ASA  5. Practice name and name of physician performing surgery?  Performing surgeon: Dr. [name], MD Requesting clearance: Honor Loh, FNP-C    6. Anesthesia type (none, local, MAC, general)? GENERAL  7. What is the office phone and fax number?   Phone: [phone] Fax: [phone]  ATTENTION: Unable to create telephone message as per your standard workflow. Directed by [facility] providers to send requests for cardiac clearance to this pool for appropriate distribution to provider covering pre-operative clearances.   Honor Loh, MSN, APRN, FNP-C, CEN [facility]  Peri-operative Services Nurse Practitioner Phone: [phone] 10/06/21 2:23 PM

## 2021-10-07 ENCOUNTER — Encounter: Payer: Self-pay | Admitting: Internal Medicine

## 2021-10-07 NOTE — Progress Notes (Signed)
PERIOPERATIVE PRESCRIPTION FOR IMPLANTED CARDIAC DEVICE PROGRAMMING  Patient Information: Name:  Jacob [name]  DOB:  [birth year]  MRN:  [id number]    Planned Procedure: TRANSURETHRAL RESECTION OF BLADDER TUMOR (TURBT); CYSTOSCOPY WITH RETROGRADE PYELOGRAM; BLADDER INSTILLATION OF GEMCITABINE  Surgeon:  Dr. [name], MD  Requesting device clearance: Honor Loh, FNP-C  Date of Procedure:  10/17/2021  Cautery will be used.  Device Information:  Clinic EP Physician:  Virl Axe, MD   Device Type:  Pacemaker Manufacturer and Phone #:  Medtronic: [id number] Pacemaker Dependent?:  Yes.   Date of Last Device Check:  09/27/21 Normal Device Function?:  Yes.    Electrophysiologist's Recommendations:  Have magnet available. Provide continuous ECG monitoring when magnet is used or reprogramming is to be performed.  Procedure may interfere with device function.  Magnet should be placed over device during procedure.  Per Device Clinic Standing Orders, Wanda Plump, RN  9:48 AM 10/07/2021

## 2021-10-09 LAB — CULTURE, URINE COMPREHENSIVE

## 2021-10-10 ENCOUNTER — Telehealth: Payer: Self-pay

## 2021-10-10 MED ORDER — SULFAMETHOXAZOLE-TRIMETHOPRIM 800-160 MG PO TABS: 1.0000 | ORAL_TABLET | Freq: Two times a day (BID) | ORAL | 0 refills | Status: DC

## 2021-10-10 NOTE — Telephone Encounter (Signed)
-----   Message from [name], MD sent at 10/10/2021  7:49 AM EDT ----- Preop patient growing lower colony count of Proteus however would recommend treatment perioperatively.  Please start Bactrim DS twice daily starting 5 days before the procedure for total of 7 days.  [name], MD

## 2021-10-10 NOTE — Telephone Encounter (Signed)
Informed patient son of medication and directions.  Bactrim sent to pharmacy.

## 2021-10-11 ENCOUNTER — Encounter
Admission: RE | Admit: 2021-10-11 | Discharge: 2021-10-11 | Disposition: A | Discharge status: Home / Self Care | Payer: Medicare Other | Source: Ambulatory Visit | Attending: Urology | Admitting: Urology

## 2021-10-11 ENCOUNTER — Telehealth: Payer: Self-pay | Admitting: Internal Medicine

## 2021-10-11 ENCOUNTER — Other Ambulatory Visit: Payer: Self-pay

## 2021-10-11 DIAGNOSIS — Z01812 Encounter for preprocedural laboratory examination: Secondary | ICD-10-CM

## 2021-10-11 DIAGNOSIS — E875 Hyperkalemia: Secondary | ICD-10-CM

## 2021-10-11 DIAGNOSIS — I129 Hypertensive chronic kidney disease with stage 1 through stage 4 chronic kidney disease, or unspecified chronic kidney disease: Secondary | ICD-10-CM

## 2021-10-11 HISTORY — DX: Anemia, unspecified: D64.9

## 2021-10-11 HISTORY — DX: Chronic obstructive pulmonary disease, unspecified: J44.9

## 2021-10-11 HISTORY — DX: Presence of cardiac pacemaker: Z95.0

## 2021-10-11 HISTORY — DX: Pneumonia, unspecified organism: J18.9

## 2021-10-11 NOTE — Patient Instructions (Addendum)
Your procedure is scheduled on: 10/17/21 - Monday Report to the Registration Desk on the 1st floor of the [facility]. To find out your arrival time, please call [phone] between 1PM - 3PM on: 10/14/21 - Friday If your arrival time is 6:00 am, do not arrive prior to that time as the [facility] entrance doors do not open until 6:00 am.  REMEMBER: Instructions that are not followed completely may result in serious medical risk, up to and including death; or upon the discretion of your surgeon and anesthesiologist your surgery may need to be rescheduled.  Do not eat food or drink any fluids after midnight the night before surgery.  No gum chewing, lozengers or hard candies.  TAKE THESE MEDICATIONS THE MORNING OF SURGERY WITH A SIP OF WATER:  - amLODipine (NORVASC)  - BREZTRI AEROSPHERE - BACTRIM DS   Hold empagliflozin (JARDIANCE) for 3 days prior to your surgery.  Use inhaler PROAIR HFA on the day of surgery and bring to the hospital.  One week prior to surgery: Stop Anti-inflammatories (NSAIDS) such as Advil, Aleve, Ibuprofen, Motrin, Naproxen, Naprosyn and Aspirin based products such as Excedrin, Goodys Powder, BC Powder.  Stop ANY OVER THE COUNTER supplements until after surgery. Omega-3 Fatty Acids , Multiple Vitamins-Minerals .  You may however, continue to take Tylenol if needed for pain up until the day of surgery.  No Alcohol for 24 hours before or after surgery.  No Smoking including e-cigarettes for 24 hours prior to surgery.  No chewable tobacco products for at least 6 hours prior to surgery.  No nicotine patches on the day of surgery.  Do not use any "recreational" drugs for at least a week prior to your surgery.  Please be advised that the combination of cocaine and anesthesia may have negative outcomes, up to and including death. If you test positive for cocaine, your surgery will be cancelled.  On the morning of surgery brush your teeth with toothpaste and  water, you may rinse your mouth with mouthwash if you wish. Do not swallow any toothpaste or mouthwash.  Do not wear jewelry, make-up, hairpins, clips or nail polish.  Do not wear lotions, powders, or perfumes.   Do not shave body from the neck down 48 hours prior to surgery just in case you cut yourself which could leave a site for infection.  Also, freshly shaved skin may become irritated if using the CHG soap.  Contact lenses, hearing aids and dentures may not be worn into surgery.  Do not bring valuables to the hospital. [facility] is not responsible for any missing/lost belongings or valuables.   Notify your doctor if there is any change in your medical condition (cold, fever, infection).  Wear comfortable clothing (specific to your surgery type) to the hospital.  After surgery, you can help prevent lung complications by doing breathing exercises.  Take deep breaths and cough every 1-2 hours. Your doctor may order a device called an Incentive Spirometer to help you take deep breaths. When coughing or sneezing, hold a pillow firmly against your incision with both hands. This is called "splinting." Doing this helps protect your incision. It also decreases belly discomfort.  If you are being admitted to the hospital overnight, leave your suitcase in the car. After surgery it may be brought to your room.  If you are being discharged the day of surgery, you will not be allowed to drive home. You will need a responsible adult (18 years or older) to  drive you home and stay with you that night.   If you are taking public transportation, you will need to have a responsible adult (18 years or older) with you. Please confirm with your physician that it is acceptable to use public transportation.   Please call the [facility] Dept. at [phone] if you have any questions about these instructions.  Surgery Visitation Policy:  Patients undergoing a surgery or procedure may  have two family members or support persons with them as long as the person is not COVID-19 positive or experiencing its symptoms.   Inpatient Visitation:    Visiting hours are 7 a.m. to 8 p.m. Up to four visitors are allowed at one time in a patient room, including children. The visitors may rotate out with other people during the day. One designated support person (adult) may remain overnight.

## 2021-10-11 NOTE — Telephone Encounter (Signed)
Pt c/o medication issue:  1. Name of Medication: empagliflozin (JARDIANCE) 10 MG TABS tablet  2. How are you currently taking this medication (dosage and times per day)?   3. Are you having a reaction (difficulty breathing--STAT)?   4. What is your medication issue? Pt was told to take 0.5 tablet up until he sees Dr. Rosario Jacks, pt wants to know does he stay on half or go up to a whole pill a day.  . "1) START Jardiance 10 mg: - take 0.5 tablet (5 mg) by mouth once daily (until seen by Dr. Rosario Jacks)"

## 2021-10-12 ENCOUNTER — Encounter
Admission: RE | Admit: 2021-10-12 | Discharge: 2021-10-12 | Disposition: A | Discharge status: Home / Self Care | Payer: Medicare Other | Source: Ambulatory Visit | Attending: Urology | Admitting: Urology

## 2021-10-12 ENCOUNTER — Encounter: Payer: Self-pay | Admitting: Urology

## 2021-10-12 DIAGNOSIS — I129 Hypertensive chronic kidney disease with stage 1 through stage 4 chronic kidney disease, or unspecified chronic kidney disease: Secondary | ICD-10-CM | POA: Diagnosis not present

## 2021-10-12 DIAGNOSIS — N184 Chronic kidney disease, stage 4 (severe): Secondary | ICD-10-CM | POA: Diagnosis not present

## 2021-10-12 DIAGNOSIS — Z01812 Encounter for preprocedural laboratory examination: Secondary | ICD-10-CM | POA: Diagnosis present

## 2021-10-12 DIAGNOSIS — E875 Hyperkalemia: Secondary | ICD-10-CM | POA: Insufficient documentation

## 2021-10-12 LAB — BASIC METABOLIC PANEL
Anion gap: 6 (ref 5–15)
BUN: 30 mg/dL — ABNORMAL HIGH (ref 8–23)
CO2: 25 mmol/L (ref 22–32)
Calcium: 8.8 mg/dL — ABNORMAL LOW (ref 8.9–10.3)
Chloride: 107 mmol/L (ref 98–111)
Creatinine, Ser: 2.12 mg/dL — ABNORMAL HIGH (ref 0.61–1.24)
GFR, Estimated: 29 mL/min — ABNORMAL LOW (ref >60)
Glucose, Bld: 107 mg/dL — ABNORMAL HIGH (ref 70–99)
Potassium: 4.3 mmol/L (ref 3.5–5.1)
Sodium: 138 mmol/L (ref 135–145)

## 2021-10-12 LAB — CBC
HCT: 31.1 % — ABNORMAL LOW (ref 39.0–52.0)
Hemoglobin: 10 g/dL — ABNORMAL LOW (ref 13.0–17.0)
MCH: 31 pg (ref 26.0–34.0)
MCHC: 32.2 g/dL (ref 30.0–36.0)
MCV: 96.3 fL (ref 80.0–100.0)
Platelets: 188 10*3/uL (ref 150–400)
RBC: 3.23 MIL/uL — ABNORMAL LOW (ref 4.22–5.81)
RDW: 14.1 % (ref 11.5–15.5)
WBC: 6 10*3/uL (ref 4.0–10.5)
nRBC: 0 % (ref 0.0–0.2)

## 2021-10-12 NOTE — Telephone Encounter (Signed)
I spoke with the patient and advised him that per Dr. Olin Pia office note on 09/27/21: With dyspnea in the context of his heart disease and near normal LV function, discussed the use of an SGLT2 including the concerns related to glycosuria and infection.  We will start him on 5 mg.  He is to see Dr. Lamonte Richer in about 1 week.  If he is tolerating this dose he can go up to 10 mg a day.  The patient confirms he saw Dr. Rosario Jacks and he explained the risks and benefits of the Jardiance, but did not tell him if he should increase his Jardiance to a full pill- 10 mg once daily.  I inquired from the patient is he was tolerating the medication and he advised that he was.  I have asked him to go ahead and increase his Jardiance to 10 mg once daily and see how he tolerates this.  The patient voices understanding and is agreeable.  He was appreciative for the call back.

## 2021-10-12 NOTE — Progress Notes (Addendum)
Perioperative Services  Pre-Admission/Anesthesia Testing Clinical Review  Date: 10/14/21  Patient Demographics:  Name: Jacob [name] DOB:   1928-06-04 MRN:   [id number]  Planned Surgical Procedure(s):    Case: [id number] Date/Time: 10/17/21 1003   Procedures:      TRANSURETHRAL RESECTION OF BLADDER TUMOR (TURBT)     CYSTOSCOPY WITH RETROGRADE PYELOGRAM (Left)     BLADDER INSTILLATION OF GEMCITABINE   Anesthesia type: General   Pre-op diagnosis: Bladder Cancer   Location: [facility] OR ROOM 10 / [facility] FOR ANESTHESIA GROUP   Surgeons: [name], MD   NOTE: Available PAT nursing documentation and vital signs have been reviewed. Clinical nursing staff has updated patient's PMH/PSHx, current medication list, and drug allergies/intolerances to ensure comprehensive history available to assist in medical decision making as it pertains to the aforementioned surgical procedure and anticipated anesthetic course. Extensive review of available clinical information performed. [place] PMH and PSHx updated with any diagnoses/procedures that  may have been inadvertently omitted during his intake with the pre-admission testing department's nursing staff.  Clinical Discussion:  Jacob [name] is a 88 y.o. male who is submitted for pre-surgical anesthesia review and clearance prior to him undergoing the above procedure. Patient is a Former Smoker (50 pack years; quit 11/2000). Pertinent PMH includes: CAD, atrial fibrillation, symptomatic bradycardia and high degree AV block (s/p PPM placement), LBBB, renal insufficiency (status post LEFT nephrectomy for RCC), COPD, GERD (on daily PPI), hiatal hernia, anemia, nephrolithiasis, BPH, recurrent bladder transitional cell carcinoma, glaucoma, OA.  Patient is followed by cardiology Caryl Comes, MD). He was last seen in the cardiology clinic on 09/27/2021; notes reviewed.  At the time of his clinic visit, patient doing well overall from a cardiovascular  perspective.  Patient denied any episodes of chest pain, however he complained of exertional dyspnea and fatigue.  He denied any PND, orthopnea, palpitations, significant peripheral edema, vertiginous symptoms, or presyncope/syncope.  Patient with past medical history significant for cardiovascular diagnoses.  Patient underwent diagnostic LEFT heart catheterization on 11/18/2004 revealing multivessel CAD; 40% proximal LAD, 50% mid LAD, 80% D1, 50% mid LCx, and 100% mid RCA.  Given the complexity of his anatomy and degree of disease, patient was referred to [facility] for PCI.  Patient subsequently underwent repeat cardiac catheterization and PCI procedure on 11/21/2004 placing a 3.5 x 23 mm Cypher DES x1 to the mid RCA and a 2.5 x 28 mm Cypher DES x1 to the distal RCA.  Procedure yielded excellent angiographic result and TIMI-3 flow.  Patient developed symptomatic bradycardia and Mobitz 2 second-degree heart block requiring placement of a dual-chamber Medtronic pacemaker on 08/29/2013.  Device is regularly interrogated by his primary electrophysiology team.  It was last interrogated on 09/27/2021 noted to be functioning normally.  Myocardial perfusion imaging study performed on 06/09/2020 revealed a severely decreased left ventricular systolic function with an EF of 20%.  There was no evidence of stress-induced myocardial ischemia or arrhythmia.  Study was determined to be high risk due to severely reduced LVEF.  Most recent TTE was performed on 07/01/2020 revealing a low normal left ventricular systolic function with an EF of 50-55%.  There were no regional wall motion abnormalities. Left ventricular diastolic Doppler parameters consistent with pseudonormalization (G2DD).  Left atrium mildly dilated.  There was trivial mitral valve regurgitation.  There was mild to moderate aortic valve sclerosis/calcification.  There was no evidence of a significant transvalvular gradient to suggest stenosis.  Patient with a  history of atrial fibrillation in the  past captured by his device. He does not currently require the use of any antiarrhythmic medications or chronic anticoagulation at this time.  Blood pressure uncontrolled at 182/68 mmHg on currently prescribed CCB (amlodipine) and nitrate (isosorbide mononitrate) therapies.  Patient is on atorvastatin for his HLD diagnosis and further ASCVD prevention.  He is not diabetic. Patient does not have an OSAH diagnosis.  ECG in the office revealed AV pacing at a rate of 63 bpm with frequent PVCs.  Functional capacity limited by age, multiple medical comorbidities, and possible neurogenic claudication.  Given patient's complaints of exertional dyspnea in the setting of known cardiovascular disease with a near normal left ventricular function, patient was started on low-dose SGLT2i (empagliflozin) with plans to up titrate if tolerated with no significant glycosuria or infection development. Of note, patient is status post LEFT nephroureterectomy.  This intervention will also provide a degree of renal protection for patient's remaining kidney. No other changes were made to his medication regimen.  Patient to follow-up with outpatient electrophysiology/cardiology in 1 year or sooner if needed.  Jacob [name] is scheduled for an TRANSURETHRAL RESECTION OF BLADDER TUMOR (TURBT); CYSTOSCOPY WITH RETROGRADE PYELOGRAM (Left); BLADDER INSTILLATION OF GEMCITABINE on 10/17/2021 with Dr. [name], MD.  Given patient's past medical history significant for cardiovascular diagnoses, presurgical cardiac clearance was sought by the PAT team. Per cardiology, "based ACC/AHA guidelines, the patient's past medical history, and the amount of time since his last clinic visit, this patient would be at an overall ACCEPTABLE risk for the planned procedure without further cardiovascular testing or intervention at this time". In review of his medication reconciliation, it is noted that patient is  currently on prescribed daily antiplatelet therapy.  Per recommendations from his primary attending surgeon, patient may continue his daily low-dose ASA throughout his perioperative course.  Patient denies previous perioperative complications with anesthesia in the past. In review of the available records, it is noted that patient underwent a general anesthetic course here at [facility] ([facility]) in 01/2014 without documented complications.      09/27/2021   12:42 PM 09/27/2021   12:17 PM 09/27/2021   11:28 AM  Vitals with BMI  Height   5\' 6"   Weight   174 lbs 6 oz  BMI   70.35  Systolic 009 381 829  Diastolic 70 68 62  Pulse   63    Providers/Specialists:   NOTE: Primary physician provider listed below. Patient may have been seen by APP or partner within same practice.   PROVIDER ROLE / SPECIALTY LAST Lu Duffel, MD Urology (Surgeon) 10/05/2021  [name], MD Primary Care Provider ???  Virl Axe, MD Cardiology 09/27/2021   Allergies:  Penicillins  Current Home Medications:   No current facility-administered medications for this encounter.    acetaminophen (TYLENOL) 325 MG tablet   amLODipine (NORVASC) 5 MG tablet   aspirin 81 MG tablet   atorvastatin (LIPITOR) 40 MG tablet   Budeson-Glycopyrrol-Formoterol (BREZTRI AEROSPHERE) 160-9-4.8 MCG/ACT AERO   cetirizine (ZYRTEC) 10 MG tablet   empagliflozin (JARDIANCE) 10 MG TABS tablet   esomeprazole (NEXIUM) 20 MG capsule   isosorbide mononitrate (IMDUR) 30 MG 24 hr tablet   latanoprost (XALATAN) 0.005 % ophthalmic solution   Multiple Vitamins-Minerals (MULTIVITAMINS THER. W/MINERALS) TABS   Omega-3 Fatty Acids (FISH OIL) 1200 MG CAPS   PROAIR HFA 108 (90 Base) MCG/ACT inhaler   senna-docusate (SENOKOT-S) 8.6-50 MG per tablet   sulfamethoxazole-trimethoprim (BACTRIM DS) 800-160 MG  tablet   History:   Past Medical History:  Diagnosis Date   Anemia    Arthritis of both  hands    BPH (benign prostatic hypertrophy)    COPD (chronic obstructive pulmonary disease) (HCC)    Coronary artery disease    a.) LHC 11/18/2004: 40% pLAD, 50% mLAD, 80% D1, 50% mLCx, 100% mRCA - refer to [facility] for PCI; b.) LHC/PCI 11/21/2004: 100% mRCA (3.5 x 23 mm Cypher DES), 95% dRCA (2.5 x 28 mm Cypher); b.) 05/03/12 Cardiac CT: patent RCA stents, nonobs dzs;  c.) 01/2013 Ex MV: nl EF, no ischemia.   Diastolic dysfunction    a.) TTE 08/29/2013: EF 60-65%, mild LVH, mild BAE, mild MR, mod TR, G1DD; b.) TTE 07/01/2020: EF 50-55%, LAEm triv MR, mild-mod Ao sclerosis with no stenosis, G2DD   GERD (gastroesophageal reflux disease)    Glaucoma of both eyes    H/O hiatal hernia    History of atrial fibrillation 2006   History of diverticulitis    History of kidney stones    History of renal cell carcinoma    a.) s/p LEFT nephroureterectomy   Hyperlipidemia    Hypertension    LBBB (left bundle branch block)    Mobitz type 2 second degree AV block    a.) s/p PPM placement 08/29/2013   Pneumonia    Presence of permanent cardiac pacemaker    Recurrent bladder transitional cell carcinoma ([place])    a.) s/p TURBT and intravesical chemotherapy   Renal insufficiency    a. Creat rose to 2.34 post-op L nephrectomy.   S/P placement of cardiac pacemaker 08/29/2013   a.) Medtroinc device placed for symptomatic bradycardia and mobitz 2 second degree heart block   Symptomatic bradycardia    a.) s/p PPM placement 08/29/2013   Past Surgical History:  Procedure Laterality Date   APPENDECTOMY  02/03/1988   CATARACT EXTRACTION W/ INTRAOCULAR LENS  IMPLANT, BILATERAL  01/02/2009   COLONOSCOPY     CORONARY ANGIOPLASTY WITH STENT PLACEMENT  11/21/2004   Procedure: CORONARY ANGIOPLASTY WITH STENT PLACEMENT (mRCA and dRCA); Location: [facility]; Surgeon: Maryjean Morn, MD   CYSTOSCOPY W/ RETROGRADES Right 01/13/2014   Procedure: CYSTOSCOPY WITH RETROGRADE PYELOGRAM;  Surgeon: [name], MD;  Location: [facility];  Service: Urology;  Laterality: Right;   CYSTOSCOPY W/ URETERAL STENT PLACEMENT Left 08/22/2013   Procedure: CYSTOSCOPY WITH RETROGRADE PYELOGRAM/URETERAL STENT PLACEMENT;  Surgeon: [name], MD;  Location: [facility];  Service: Urology;  Laterality: Left;   CYSTOSCOPY WITH BIOPSY  04/13/2011   Procedure: CYSTOSCOPY WITH BIOPSY;  Surgeon: [name], MD;  Location: [facility];  Service: Urology;  Laterality: N/A;  Cold Cup Biopsy   CYSTOSCOPY WITH RETROGRADE PYELOGRAM, URETEROSCOPY AND STENT PLACEMENT Left 06/19/2013   Procedure: CYSTOSCOPY WITH LEFT RETROGRADE PYELOGRAM, LEFT URETEROSCOPY, ureteral balloon dilatation, BIOPSY LEFT KIDNEY;  Surgeon: [name], MD;  Location[facility];  Service: Urology;  Laterality: Left;   INGUINAL HERNIA REPAIR Left 08/22/2013   Procedure: LAPAROSCOPIC INGUINAL HERNIA;  Surgeon: [name], MD;  Location: [facility];  Service: Urology;  Laterality: Left;   IR KYPHO LUMBAR INC FX REDUCE BONE BX UNI/BIL CANNULATION INC/IMAGING  01/31/2021   LEFT HEART CATH AND CORONARY ANGIOGRAPHY Left 11/18/2004   Procedure: LEFT HEART CATH AND CORONARY ANGIOGRAPHY; Location: [facility]; Surgeon: Neoma Laming, MD   PERMANENT PACEMAKER INSERTION N/A 08/29/2013   Procedure: PERMANENT PACEMAKER INSERTION;  Surgeon: [name], MD;  Location: [facility] CATH LAB;  Service: Cardiovascular;  Laterality: N/A;  Medtronic dual-chamber   ROBOT ASSITED LAPAROSCOPIC NEPHROURETERECTOMY Left 08/22/2013   Procedure: ROBOT ASSITED LAPAROSCOPIC NEPHROURETERECTOMY;  Surgeon: [name], MD;  Location: [facility];  Service: Urology;  Laterality: Left;   TRANSURETHRAL RESECTION OF BLADDER TUMOR  12/22/2010   TRANSURETHRAL RESECTION OF BLADDER TUMOR  08/04/2011   Procedure: TRANSURETHRAL RESECTION OF BLADDER TUMOR (TURBT);  Surgeon: [name], MD;  Location: [facility];  Service: Urology;  Laterality: N/A;   TRANSURETHRAL RESECTION  OF BLADDER TUMOR N/A 01/13/2014   Procedure: TRANSURETHRAL RESECTION OF BLADDER TUMOR (TURBT);  Surgeon: [name], MD;  Location: [facility];  Service: Urology;  Laterality: N/A;   Family History  Problem Relation Age of Onset   Heart attack Mother    Heart attack Father    Prostate cancer Neg Hx    Bladder Cancer Neg Hx    Kidney cancer Neg Hx    Social History   Tobacco Use   Smoking status: Former    Packs/day: 1.00    Years: 50.00    Total pack years: 50.00    Types: Cigarettes    Quit date: 11/02/2000    Years since quitting: 20.9   Smokeless tobacco: Never  Vaping Use   Vaping Use: Never used  Substance Use Topics   Alcohol use: No   Drug use: No    Pertinent Clinical Results:  LABS: Labs reviewed: Acceptable for surgery.  Hospital Outpatient Visit on 10/12/2021  Component Date Value Ref Range Status   WBC 10/12/2021 6.0  4.0 - 10.5 K/uL Final   RBC 10/12/2021 3.23 (L)  4.22 - 5.81 MIL/uL Final   Hemoglobin 10/12/2021 10.0 (L)  13.0 - 17.0 g/dL Final   HCT 10/12/2021 31.1 (L)  39.0 - 52.0 % Final   MCV 10/12/2021 96.3  80.0 - 100.0 fL Final   MCH 10/12/2021 31.0  26.0 - 34.0 pg Final   MCHC 10/12/2021 32.2  30.0 - 36.0 g/dL Final   RDW 10/12/2021 14.1  11.5 - 15.5 % Final   Platelets 10/12/2021 188  150 - 400 K/uL Final   nRBC 10/12/2021 0.0  0.0 - 0.2 % Final   Performed at [facility], [place], [place] [address]   Sodium 10/12/2021 138  135 - 145 mmol/L Final   Potassium 10/12/2021 4.3  3.5 - 5.1 mmol/L Final   Chloride 10/12/2021 107  98 - 111 mmol/L Final   CO2 10/12/2021 25  22 - 32 mmol/L Final   Glucose, Bld 10/12/2021 107 (H)  70 - 99 mg/dL Final   Glucose reference range applies only to samples taken after fasting for at least 8 hours.   BUN 10/12/2021 30 (H)  8 - 23 mg/dL Final   Creatinine, Ser 10/12/2021 2.12 (H)  0.61 - 1.24 mg/dL Final   Calcium 10/12/2021 8.8 (L)  8.9 - 10.3 mg/dL Final   GFR,  Estimated 10/12/2021 29 (L)  >60 mL/min Final   Comment: (NOTE) Calculated using the CKD-EPI Creatinine Equation (2021)    Anion gap 10/12/2021 6  5 - 15 Final   Performed at [facility], [address] [place], [place] [address]    ECG: Date: 09/27/2021  Time ECG obtained: 1135 AM Rate: 63 bpm Rhythm:  AV dual paced rhythm with occasional PVCs Axis (leads I and aVF): Normal Intervals: QRS 172 ms. QTc 485 ms. ST segment and T wave changes: No evidence of acute ST segment  elevation or depression Comparison: Similar to previous tracing obtained on 05/25/2020   IMAGING / PROCEDURES: TRANSTHORACIC ECHOCARDIOGRAM performed on 07/01/2020 Left ventricular ejection fraction, by estimation, is 50 to 55%. The left ventricle has low normal function. The left ventricle has no regional wall motion abnormalities. Left ventricular diastolic parameters are consistent with Grade II diastolic dysfunction (pseudonormalization).  Right ventricular systolic function is normal. The right ventricular size is normal.  Left atrial size was mildly dilated.  The mitral valve is grossly normal. Trivial mitral valve regurgitation.  The aortic valve is tricuspid. Aortic valve regurgitation is not visualized. Mild to moderate aortic valve sclerosis/calcification is present, without any evidence of aortic stenosis.  The inferior vena cava is normal in size with greater than 50% respiratory variability, suggesting right atrial pressure of 3 mmHg.   MYOCARDIAL PERFUSION IMAGING STUDY (LEXISCAN) performed on 06/09/2020 Severely reduced left ventricular systolic function with an EF of 20% No evidence for stress-induced myocardial ischemia or arrhythmia; no scintigraphic evidence of scar High risk study due to severely reduced EF  CORONARY CTA performed on 05/03/2012 Coronary calcium score of 2339.9 RIGHT dominant system with moderate proximal to mid LAD calcified disease; normal LCx, and no significant  restenosis of the previously placed mid RCA stents. Recommendations are for continued medical management.  Impression and Plan:  Jacob [name] has been referred for pre-anesthesia review and clearance prior to him undergoing the planned anesthetic and procedural courses. Available labs, pertinent testing, and imaging results were personally reviewed by me. This patient has been appropriately cleared by cardiology with an overall ACCEPTABLE risk of significant perioperative cardiovascular complications. Completed perioperative prescription for cardiac device management documentation completed by primary cardiology team and placed on patient's chart for review by the surgical/anesthetic team on the day of his procedure. Of note, cardiology indicating that procedure "may interfere" with device and that magnet should be utilized during the procedure. Industry agrees with no further recommendations beyond what was offered by electrophysiology team.   Based on clinical review performed today (10/14/21), barring any significant acute changes in the patient's overall condition, it is anticipated that he will be able to proceed with the planned surgical intervention. Any acute changes in clinical condition may necessitate his procedure being postponed and/or cancelled. Patient will meet with anesthesia team (MD and/or CRNA) on the day of his procedure for preoperative evaluation/assessment. Questions regarding anesthetic course will be fielded at that time.   Pre-surgical instructions were reviewed with the patient during his PAT appointment and questions were fielded by PAT clinical staff. Patient was advised that if any questions or concerns arise prior to his procedure then he should return a call to PAT and/or his surgeon's office to discuss.  Honor Loh, MSN, APRN, FNP-C, CEN [facility]  Peri-operative Services Nurse Practitioner Phone: [phone] Fax: [phone] 10/14/21  7:55 AM  NOTE: This note has been prepared using Dragon dictation software. Despite my best ability to proofread, there is always the potential that unintentional transcriptional errors may still occur from this process.

## 2021-10-14 ENCOUNTER — Encounter: Payer: Self-pay | Admitting: Urology

## 2021-10-16 MED ORDER — CEFAZOLIN SODIUM-DEXTROSE 2-4 GM/100ML-% IV SOLN
2.0000 g | INTRAVENOUS | Status: AC
  Administered 2021-10-17: 2 g via INTRAVENOUS

## 2021-10-16 MED ORDER — GEMCITABINE CHEMO FOR BLADDER INSTILLATION 2000 MG
2000.0000 mg | Freq: Once | INTRAVENOUS | Status: DC
  Filled 2021-10-16: qty 52.6

## 2021-10-16 MED ORDER — CHLORHEXIDINE GLUCONATE 0.12 % MT SOLN: 15.0000 mL | Freq: Once | OROMUCOSAL | Status: AC

## 2021-10-16 MED ORDER — LACTATED RINGERS IV SOLN: INTRAVENOUS | Status: DC

## 2021-10-16 MED ORDER — ORAL CARE MOUTH RINSE: 15.0000 mL | Freq: Once | OROMUCOSAL | Status: AC

## 2021-10-17 ENCOUNTER — Ambulatory Visit: Payer: Medicare Other | Admitting: Urgent Care

## 2021-10-17 ENCOUNTER — Encounter
Admission: RE | Disposition: A | Discharge status: Home / Self Care | Payer: Self-pay | Source: Home / Self Care | Attending: Urology

## 2021-10-17 ENCOUNTER — Ambulatory Visit
Admission: RE | Admit: 2021-10-17 | Discharge: 2021-10-17 | Disposition: A | Discharge status: Home / Self Care | Payer: Medicare Other | Source: Home / Self Care | Attending: Urology | Admitting: Urology

## 2021-10-17 ENCOUNTER — Other Ambulatory Visit: Payer: Self-pay

## 2021-10-17 ENCOUNTER — Encounter: Payer: Self-pay | Admitting: Urology

## 2021-10-17 ENCOUNTER — Ambulatory Visit: Payer: Medicare Other

## 2021-10-17 DIAGNOSIS — I2510 Atherosclerotic heart disease of native coronary artery without angina pectoris: Secondary | ICD-10-CM | POA: Insufficient documentation

## 2021-10-17 DIAGNOSIS — Z87891 Personal history of nicotine dependence: Secondary | ICD-10-CM | POA: Insufficient documentation

## 2021-10-17 DIAGNOSIS — Z8546 Personal history of malignant neoplasm of prostate: Secondary | ICD-10-CM | POA: Insufficient documentation

## 2021-10-17 DIAGNOSIS — C661 Malignant neoplasm of right ureter: Secondary | ICD-10-CM | POA: Insufficient documentation

## 2021-10-17 DIAGNOSIS — C679 Malignant neoplasm of bladder, unspecified: Secondary | ICD-10-CM

## 2021-10-17 DIAGNOSIS — Z950 Presence of cardiac pacemaker: Secondary | ICD-10-CM | POA: Insufficient documentation

## 2021-10-17 DIAGNOSIS — E875 Hyperkalemia: Secondary | ICD-10-CM | POA: Diagnosis not present

## 2021-10-17 DIAGNOSIS — J449 Chronic obstructive pulmonary disease, unspecified: Secondary | ICD-10-CM | POA: Insufficient documentation

## 2021-10-17 DIAGNOSIS — E119 Type 2 diabetes mellitus without complications: Secondary | ICD-10-CM | POA: Insufficient documentation

## 2021-10-17 DIAGNOSIS — I10 Essential (primary) hypertension: Secondary | ICD-10-CM | POA: Insufficient documentation

## 2021-10-17 DIAGNOSIS — C672 Malignant neoplasm of lateral wall of bladder: Secondary | ICD-10-CM | POA: Insufficient documentation

## 2021-10-17 HISTORY — PX: BLADDER INSTILLATION: SHX6893

## 2021-10-17 HISTORY — DX: Primary osteoarthritis, left hand: M19.042

## 2021-10-17 HISTORY — PX: URETEROSCOPY: SHX842

## 2021-10-17 HISTORY — PX: URETERAL BIOPSY: SHX6688

## 2021-10-17 HISTORY — DX: Primary osteoarthritis, right hand: M19.041

## 2021-10-17 HISTORY — PX: TRANSURETHRAL RESECTION OF BLADDER TUMOR: SHX2575

## 2021-10-17 HISTORY — PX: CYSTOSCOPY W/ URETERAL STENT PLACEMENT: SHX1429

## 2021-10-17 HISTORY — DX: Other ill-defined heart diseases: I51.89

## 2021-10-17 SURGERY — INSTILLATION, BLADDER
Anesthesia: General | Laterality: Right

## 2021-10-17 MED ORDER — STERILE WATER FOR IRRIGATION IR SOLN
Status: DC | PRN
  Administered 2021-10-17: 6000 mL

## 2021-10-17 MED ORDER — DEXAMETHASONE SODIUM PHOSPHATE 10 MG/ML IJ SOLN
INTRAMUSCULAR | Status: DC | PRN
  Administered 2021-10-17: 5 mg via INTRAVENOUS

## 2021-10-17 MED ORDER — FENTANYL CITRATE (PF) 100 MCG/2ML IJ SOLN
INTRAMUSCULAR | Status: DC | PRN
  Administered 2021-10-17 (×2): 50 ug via INTRAVENOUS

## 2021-10-17 MED ORDER — LIDOCAINE HCL (CARDIAC) PF 100 MG/5ML IV SOSY
PREFILLED_SYRINGE | INTRAVENOUS | Status: DC | PRN
  Administered 2021-10-17: 50 mg via INTRAVENOUS

## 2021-10-17 MED ORDER — FENTANYL CITRATE (PF) 100 MCG/2ML IJ SOLN: 25.0000 ug | INTRAMUSCULAR | Status: DC | PRN

## 2021-10-17 MED ORDER — HYDROCODONE-ACETAMINOPHEN 5-325 MG PO TABS
ORAL_TABLET | ORAL | Status: AC
  Filled 2021-10-17: qty 1

## 2021-10-17 MED ORDER — IOHEXOL 180 MG/ML  SOLN
INTRAMUSCULAR | Status: DC | PRN
  Administered 2021-10-17: 20 mL

## 2021-10-17 MED ORDER — SODIUM CHLORIDE 0.9 % IR SOLN
Status: DC | PRN
  Administered 2021-10-17: 3000 mL

## 2021-10-17 MED ORDER — DEXMEDETOMIDINE HCL IN NACL 200 MCG/50ML IV SOLN
INTRAVENOUS | Status: DC | PRN
  Administered 2021-10-17 (×2): 4 ug via INTRAVENOUS

## 2021-10-17 MED ORDER — FENTANYL CITRATE (PF) 100 MCG/2ML IJ SOLN
INTRAMUSCULAR | Status: AC
  Filled 2021-10-17: qty 2

## 2021-10-17 MED ORDER — HYDROCODONE-ACETAMINOPHEN 5-325 MG PO TABS
1.0000 | ORAL_TABLET | Freq: Once | ORAL | Status: AC
  Administered 2021-10-17: 1 via ORAL

## 2021-10-17 MED ORDER — GEMCITABINE CHEMO FOR BLADDER INSTILLATION 2000 MG
INTRAVENOUS | Status: DC | PRN
  Administered 2021-10-17: 2000 mg via INTRAVESICAL

## 2021-10-17 MED ORDER — PHENYLEPHRINE 80 MCG/ML (10ML) SYRINGE FOR IV PUSH (FOR BLOOD PRESSURE SUPPORT)
PREFILLED_SYRINGE | INTRAVENOUS | Status: DC | PRN
  Administered 2021-10-17 (×2): 80 ug via INTRAVENOUS

## 2021-10-17 MED ORDER — PROPOFOL 10 MG/ML IV BOLUS
INTRAVENOUS | Status: DC | PRN
  Administered 2021-10-17: 100 mg via INTRAVENOUS

## 2021-10-17 MED ORDER — OXYBUTYNIN CHLORIDE 5 MG PO TABS: 5.0000 mg | ORAL_TABLET | Freq: Three times a day (TID) | ORAL | 0 refills | Status: DC | PRN

## 2021-10-17 MED ORDER — ACETAMINOPHEN 10 MG/ML IV SOLN
INTRAVENOUS | Status: DC | PRN
  Administered 2021-10-17: 1000 mg via INTRAVENOUS

## 2021-10-17 MED ORDER — CHLORHEXIDINE GLUCONATE 0.12 % MT SOLN
OROMUCOSAL | Status: AC
  Administered 2021-10-17: 15 mL via OROMUCOSAL
  Filled 2021-10-17: qty 15

## 2021-10-17 MED ORDER — HYDROCODONE-ACETAMINOPHEN 5-325 MG PO TABS: 1.0000 | ORAL_TABLET | Freq: Four times a day (QID) | ORAL | 0 refills | Status: DC | PRN

## 2021-10-17 MED ORDER — CEFAZOLIN SODIUM-DEXTROSE 2-4 GM/100ML-% IV SOLN
INTRAVENOUS | Status: AC
  Filled 2021-10-17: qty 100

## 2021-10-17 MED ORDER — ONDANSETRON HCL 4 MG/2ML IJ SOLN: 4.0000 mg | Freq: Once | INTRAMUSCULAR | Status: DC | PRN

## 2021-10-17 MED ORDER — ONDANSETRON HCL 4 MG/2ML IJ SOLN
INTRAMUSCULAR | Status: DC | PRN
  Administered 2021-10-17: 4 mg via INTRAVENOUS

## 2021-10-17 SURGICAL SUPPLY — 41 items
BAG DRAIN SIEMENS DORNER NS (MISCELLANEOUS) ×4 IMPLANT
BAG DRN NS LF (MISCELLANEOUS) ×4
BAG DRN RND TRDRP ANRFLXCHMBR (UROLOGICAL SUPPLIES) ×4
BAG URINE DRAIN 2000ML AR STRL (UROLOGICAL SUPPLIES) ×4 IMPLANT
BRUSH SCRUB EZ  4% CHG (MISCELLANEOUS)
BRUSH SCRUB EZ 1% IODOPHOR (MISCELLANEOUS) ×4 IMPLANT
BRUSH SCRUB EZ 4% CHG (MISCELLANEOUS) ×3 IMPLANT
CATH FOLEY 2WAY  5CC 16FR (CATHETERS) ×4
CATH FOLEY 2WAY 5CC 16FR (CATHETERS) ×4
CATH URETL OPEN 5X70 (CATHETERS) ×3 IMPLANT
CATH URTH 16FR FL 2W BLN LF (CATHETERS) ×4 IMPLANT
DRAPE UTILITY 15X26 TOWEL STRL (DRAPES) ×4 IMPLANT
DRSG TELFA 3X4 N-ADH STERILE (GAUZE/BANDAGES/DRESSINGS) ×4 IMPLANT
ELECT LOOP 22F BIPOLAR SML (ELECTROSURGICAL)
ELECT REM PT RETURN 9FT ADLT (ELECTROSURGICAL)
ELECT URO SHT TIP 3F (MISCELLANEOUS) ×4
ELECTRODE LOOP 22F BIPOLAR SML (ELECTROSURGICAL) IMPLANT
ELECTRODE REM PT RTRN 9FT ADLT (ELECTROSURGICAL) IMPLANT
ELECTRODE URO SHT TIP 3F (MISCELLANEOUS) ×1 IMPLANT
FIBER LASER MOSES 200 DFL (Laser) ×1 IMPLANT
GAUZE 4X4 16PLY ~~LOC~~+RFID DBL (SPONGE) ×8 IMPLANT
GLOVE BIO SURGEON STRL SZ 6.5 (GLOVE) ×4 IMPLANT
GOWN STRL REUS W/ TWL LRG LVL3 (GOWN DISPOSABLE) ×8 IMPLANT
GOWN STRL REUS W/TWL LRG LVL3 (GOWN DISPOSABLE) ×8
GUIDEWIRE STR DUAL SENSOR (WIRE) ×5 IMPLANT
IV NS IRRIG 3000ML ARTHROMATIC (IV SOLUTION) ×4 IMPLANT
KIT TURNOVER CYSTO (KITS) ×4 IMPLANT
LOOP CUT BIPOLAR 24F LRG (ELECTROSURGICAL) IMPLANT
NDL SAFETY ECLIP 18X1.5 (MISCELLANEOUS) ×4 IMPLANT
PACK CYSTO AR (MISCELLANEOUS) ×4 IMPLANT
PAD ARMBOARD 7.5X6 YLW CONV (MISCELLANEOUS) ×4 IMPLANT
SET CYSTO W/LG BORE CLAMP LF (SET/KITS/TRAYS/PACK) ×4 IMPLANT
SET IRRIG Y TYPE TUR BLADDER L (SET/KITS/TRAYS/PACK) ×4 IMPLANT
STENT URET 6FRX26 CONTOUR (STENTS) ×1 IMPLANT
SURGILUBE 2OZ TUBE FLIPTOP (MISCELLANEOUS) ×4 IMPLANT
SYR TOOMEY IRRIG 70ML (MISCELLANEOUS) ×4
SYRINGE TOOMEY IRRIG 70ML (MISCELLANEOUS) ×4 IMPLANT
TRAP FLUID SMOKE EVACUATOR (MISCELLANEOUS) ×3 IMPLANT
WATER STERILE IRR 1000ML POUR (IV SOLUTION) ×4 IMPLANT
WATER STERILE IRR 3000ML UROMA (IV SOLUTION) ×2 IMPLANT
WATER STERILE IRR 500ML POUR (IV SOLUTION) ×3 IMPLANT

## 2021-10-17 NOTE — Discharge Instructions (Addendum)

## 2021-10-17 NOTE — Op Note (Addendum)
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  Postoperative diagnosis:  Same as above Right distal ureteral tumor  Procedure: Cystoscopy TURBT, small but multifocal Right retrograde pyelogram Right ureteroscopy Right ureteral tumor biopsy Laser ablation of right distal ureteral tumor Instillation of intravesical gemcitabine  Surgeon: [name], MD  Anesthesia: General  Complications: None  Intraoperative findings: Multifocal primarily superficial appearing bladder cancer involving the right lateral bladder wall adjacent to but not involving the right UO as well as left lateral bladder wall, at least 8 individual tumors no greater than 1 cm.  Abnormal right retrograde pyelogram with a filling defect within the right distal ureter, approximately 1.5 cm highly concerning for upper tract TCC.  Visually confirmed via diagnostic ureteroscopy.  Tumor was partially obstructing the lumen but without hydroureteronephrosis.  The remaining tumor was ablated and fulgurated.  No contrast extravasation, stent placed.  EBL: Minimal  Specimens: Right ureteral tumor, bladder tumor  Drains: 6 x 26 French double-J ureteral stent, 16 French Foley catheter  Indication: Jacob [name] is a 88 y.o. 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  After reviewing the management options for treatment, he elected to proceed with the above surgical procedure(s). We have discussed the potential benefits and risks of the procedure, side effects of the proposed treatment, the likelihood of the patient achieving the goals of the procedure, and any potential problems that might occur during the procedure or recuperation. Informed consent has been  obtained.  Description of procedure:  The patient was taken to the operating room and general anesthesia was induced.  The patient was placed in the dorsal lithotomy position, prepped and draped in the usual sterile fashion, and preoperative antibiotics were administered. A preoperative time-out was performed.   A 21 French cystoscope was advanced per urethra into the bladder.  Notably, the left UO was surgically absent.  There is multifocal tumor, papillary in nature the largest of which was on the left lateral bladder wall measuring up to 1 cm.  There is also tumor adjacent to the right UO but not involving the UO itself.  These had a somewhat superficial appearance.  Attention was turned to the right UO.  It was intubated using a 5 [place] open-ended ureteral catheter.  Retrograde pyelogram showed some J hooking of the distal ureter and about 3 cm proximal to the UVJ, approximately 1.5 cm filling defect was identified on the medial aspect of the ureter which was highly concerning for upper tract TCC.  The remainder of the ureter appeared to be normal in caliber along with the normal-appearing renal pelvis.  There was no hydronephrosis noted.  I then placed a wire up to the level of the kidney.  A 5 French open-ended ureteral catheter was advanced alongside of the wire to the area of the filling defect where in fact there was a papillary appearing tumor.  The base was somewhat broad and nodular.  There were adjacent multifocal satellite lesions which appeared superficial.  I then brought in [place] forceps and biopsied the tumor x3.  Unfortunately, the biopsies were relatively small and was not able to pull much of the tumor from the spherical mass nor was able to get biopsies down near the base of the tumor.  I was able to advance the  scope beyond the tumor and the remainder of the ureteral biopsy the proximal ureter was unremarkable.  Next, a 200 m laser fiber was then brought in and using ablative  settings of 1 J and 15 Hz, I ablated the right ureteral mass down to the level of the ureteral wall.  The base of the tumor was quite solid.  I then tried to ablate some of the more superficial satellite lesions but due to bleeding, bubbles and visualization issues, is unclear whether is successful at ablating every last tumor.  I brought in a ureteroscopic [place] to help hemostasis.  This was used judiciously and minimally.  Finally, a last retrograde pyelogram was performed indicating no contrast extravasation and no further filling defect.  I ended up leaving the wire in place and at the end of the procedure, backloaded the safety wire over rigid cystoscope and placed a 6 x 26 French double-J ureteral stent.  There was a curl noted hooking over the upper pole moiety as well as a full coil in the bladder.  Next, cold cup biopsy forceps were used to remove each of the tumors.  These were passed off the field together.  There is approximately 8 or 9 small tumors.  As aforementioned, the largest was about 1 cm.  Next, likely electrocautery was used to fulgurate the base of each of these areas.  Hemostasis was excellent.  I did fulgurate adjacent to the right UO where tumor was resected but did not fulgurate the UO itself.  Finally the scope was removed and a 16 French Foley catheter was placed with 10 cc of sterile water in the balloon.  The patient was then cleaned and dried.  He was repositioned in supine position, reversed of anesthesia, taken to the PACU in stable condition.  2000 mg of intravesical gemcitabine was allowed to dwell in his bladder for 1 hour.  At the end of the hour, his bladder was drained.  This was well-tolerated.  Plan: We will have him return to the office next week to discuss.  He is having a lot of back pain, he will need repeat upper tract staging to ensure that there is no evidence of metastatic disease.  We will likely plan for return second look procedure in 4 to 6 weeks  [name], M.D.

## 2021-10-17 NOTE — Interval H&P Note (Signed)
History and Physical Interval Note:  10/17/2021 9:35 AM  Jacob [name]  has presented today for surgery, with the diagnosis of Bladder Cancer.  The various methods of treatment have been discussed with the patient and family. After consideration of risks, benefits and other options for treatment, the patient has consented to  Procedure(s): TRANSURETHRAL RESECTION OF BLADDER TUMOR (TURBT) (N/A) CYSTOSCOPY WITH RETROGRADE PYELOGRAM (Left) BLADDER INSTILLATION OF GEMCITABINE (N/A) as a surgical intervention.  The patient's history has been reviewed, patient examined, no change in status, stable for surgery.  I have reviewed the patient's chart and labs.  Questions were answered to the patient's satisfaction.    RRR CTAB  He has been taking the antibiotics as prescribed periop   [name]

## 2021-10-17 NOTE — Anesthesia Postprocedure Evaluation (Signed)
Anesthesia Post Note  Patient: Jacob [name]  Procedure(s) Performed: BLADDER INSTILLATION OF GEMCITABINE URETERAL BIOPSY CYSTOSCOPY WITH RETROGRADE PYELOGRAM/URETERAL STENT PLACEMENT (Right) URETEROSCOPY TRANSURETHRAL RESECTION OF BLADDER TUMOR (TURBT)  Patient location during evaluation: PACU Anesthesia Type: General Level of consciousness: awake and alert Pain management: pain level controlled Vital Signs Assessment: post-procedure vital signs reviewed and stable Respiratory status: spontaneous breathing, nonlabored ventilation, respiratory function stable and patient connected to nasal cannula oxygen Cardiovascular status: blood pressure returned to baseline and stable Postop Assessment: no apparent nausea or vomiting Anesthetic complications: no   No notable events documented.   Last Vitals:  Vitals:   10/17/21 1202 10/17/21 1215  BP: (!) 147/59 (!) 156/65  Pulse: 70 65  Resp: (!) 24 17  Temp:  36.4 C  SpO2: 92% 99%    Last Pain:  Vitals:   10/17/21 1202  TempSrc:   PainSc: 10-Worst pain ever                 Molli Barrows

## 2021-10-17 NOTE — Anesthesia Preprocedure Evaluation (Signed)
Anesthesia Evaluation  Patient identified by MRN, date of birth, ID band Patient awake    Reviewed: Allergy & Precautions, H&P , NPO status , Patient's Chart, lab work & pertinent test results, reviewed documented beta blocker date and time   Airway Mallampati: II  TM Distance: >3 FB Neck ROM: full    Dental  (+) Teeth Intact   Pulmonary neg pulmonary ROS, pneumonia, COPD, former smoker,    Pulmonary exam normal        Cardiovascular Exercise Tolerance: Poor hypertension, + CAD  negative cardio ROS Normal cardiovascular exam+ dysrhythmias + pacemaker  Rate:Normal     Neuro/Psych negative neurological ROS  negative psych ROS   GI/Hepatic negative GI ROS, Neg liver ROS, hiatal hernia, GERD  Medicated,  Endo/Other  negative endocrine ROSdiabetes  Renal/GU Renal diseasenegative Renal ROS  negative genitourinary   Musculoskeletal   Abdominal   Peds  Hematology negative hematology ROS (+) Blood dyscrasia, anemia ,   Anesthesia Other Findings   Reproductive/Obstetrics negative OB ROS                             Anesthesia Physical Anesthesia Plan  ASA: 4  Anesthesia Plan: General LMA   Post-op Pain Management:    Induction:   PONV Risk Score and Plan: 3  Airway Management Planned:   Additional Equipment:   Intra-op Plan:   Post-operative Plan:   Informed Consent: I have reviewed the patients History and Physical, chart, labs and discussed the procedure including the risks, benefits and alternatives for the proposed anesthesia with the patient or authorized representative who has indicated his/her understanding and acceptance.       Plan Discussed with: CRNA  Anesthesia Plan Comments:         Anesthesia Quick Evaluation

## 2021-10-17 NOTE — Transfer of Care (Signed)
Immediate Anesthesia Transfer of Care Note  Patient: BRAYLON GRENDA  Procedure(s) Performed: BLADDER INSTILLATION OF GEMCITABINE URETERAL BIOPSY CYSTOSCOPY WITH RETROGRADE PYELOGRAM/URETERAL STENT PLACEMENT (Right) URETEROSCOPY TRANSURETHRAL RESECTION OF BLADDER TUMOR (TURBT)  Patient Location: PACU  Anesthesia Type:General  Level of Consciousness: drowsy  Airway & Oxygen Therapy: Patient Spontanous Breathing  Post-op Assessment: Report given to RN and Post -op Vital signs reviewed and stable  Post vital signs: Reviewed and stable  Last Vitals:  Vitals Value Taken Time  BP 129/48 10/17/21 1124  Temp    Pulse 59 10/17/21 1126  Resp 17 10/17/21 1126  SpO2 97 % 10/17/21 1126  Vitals shown include unvalidated device data.  Last Pain:  Vitals:   10/17/21 0833  TempSrc: Temporal  PainSc: 8          Complications: No notable events documented.

## 2021-10-17 NOTE — Anesthesia Procedure Notes (Signed)
Procedure Name: LMA Insertion Date/Time: 10/17/2021 10:02 AM  Performed by: Biagio Borg, CRNAPre-anesthesia Checklist: Patient identified, Emergency Drugs available, Suction available and Patient being monitored Patient Re-evaluated:Patient Re-evaluated prior to induction Oxygen Delivery Method: Circle system utilized Preoxygenation: Pre-oxygenation with 100% oxygen Induction Type: IV induction Ventilation: Mask ventilation without difficulty LMA: LMA inserted LMA Size: 4.0 Tube type: Oral Number of attempts: 1 Placement Confirmation: positive ETCO2 and breath sounds checked- equal and bilateral Tube secured with: Tape Dental Injury: Teeth and Oropharynx as per pre-operative assessment

## 2021-10-18 ENCOUNTER — Other Ambulatory Visit: Payer: Self-pay

## 2021-10-18 ENCOUNTER — Emergency Department: Payer: Medicare Other

## 2021-10-18 ENCOUNTER — Encounter: Payer: Self-pay | Admitting: Urology

## 2021-10-18 ENCOUNTER — Inpatient Hospital Stay
Admission: EM | Admit: 2021-10-18 | Discharge: 2021-10-28 | DRG: 657 | Disposition: A | Discharge status: Skilled Nursing Facility | Payer: Medicare Other | Attending: Internal Medicine | Admitting: Internal Medicine

## 2021-10-18 DIAGNOSIS — I10 Essential (primary) hypertension: Secondary | ICD-10-CM | POA: Diagnosis present

## 2021-10-18 DIAGNOSIS — M19042 Primary osteoarthritis, left hand: Secondary | ICD-10-CM | POA: Diagnosis present

## 2021-10-18 DIAGNOSIS — C649 Malignant neoplasm of unspecified kidney, except renal pelvis: Secondary | ICD-10-CM | POA: Diagnosis present

## 2021-10-18 DIAGNOSIS — Z79899 Other long term (current) drug therapy: Secondary | ICD-10-CM

## 2021-10-18 DIAGNOSIS — N400 Enlarged prostate without lower urinary tract symptoms: Secondary | ICD-10-CM | POA: Diagnosis present

## 2021-10-18 DIAGNOSIS — H409 Unspecified glaucoma: Secondary | ICD-10-CM | POA: Diagnosis present

## 2021-10-18 DIAGNOSIS — K30 Functional dyspepsia: Secondary | ICD-10-CM | POA: Diagnosis not present

## 2021-10-18 DIAGNOSIS — K219 Gastro-esophageal reflux disease without esophagitis: Secondary | ICD-10-CM | POA: Diagnosis present

## 2021-10-18 DIAGNOSIS — R300 Dysuria: Secondary | ICD-10-CM | POA: Diagnosis not present

## 2021-10-18 DIAGNOSIS — Z905 Acquired absence of kidney: Secondary | ICD-10-CM

## 2021-10-18 DIAGNOSIS — M4856XA Collapsed vertebra, not elsewhere classified, lumbar region, initial encounter for fracture: Secondary | ICD-10-CM | POA: Diagnosis present

## 2021-10-18 DIAGNOSIS — K5900 Constipation, unspecified: Secondary | ICD-10-CM | POA: Diagnosis not present

## 2021-10-18 DIAGNOSIS — Z85528 Personal history of other malignant neoplasm of kidney: Secondary | ICD-10-CM

## 2021-10-18 DIAGNOSIS — I2510 Atherosclerotic heart disease of native coronary artery without angina pectoris: Secondary | ICD-10-CM | POA: Diagnosis present

## 2021-10-18 DIAGNOSIS — R52 Pain, unspecified: Secondary | ICD-10-CM

## 2021-10-18 DIAGNOSIS — I5032 Chronic diastolic (congestive) heart failure: Secondary | ICD-10-CM | POA: Diagnosis present

## 2021-10-18 DIAGNOSIS — Z8249 Family history of ischemic heart disease and other diseases of the circulatory system: Secondary | ICD-10-CM

## 2021-10-18 DIAGNOSIS — C661 Malignant neoplasm of right ureter: Secondary | ICD-10-CM | POA: Diagnosis present

## 2021-10-18 DIAGNOSIS — M19041 Primary osteoarthritis, right hand: Secondary | ICD-10-CM | POA: Diagnosis present

## 2021-10-18 DIAGNOSIS — I447 Left bundle-branch block, unspecified: Secondary | ICD-10-CM | POA: Diagnosis present

## 2021-10-18 DIAGNOSIS — S32040A Wedge compression fracture of fourth lumbar vertebra, initial encounter for closed fracture: Secondary | ICD-10-CM | POA: Diagnosis present

## 2021-10-18 DIAGNOSIS — C642 Malignant neoplasm of left kidney, except renal pelvis: Secondary | ICD-10-CM | POA: Diagnosis present

## 2021-10-18 DIAGNOSIS — K769 Liver disease, unspecified: Secondary | ICD-10-CM | POA: Diagnosis present

## 2021-10-18 DIAGNOSIS — I700 Atherosclerosis of aorta: Secondary | ICD-10-CM | POA: Diagnosis present

## 2021-10-18 DIAGNOSIS — C61 Malignant neoplasm of prostate: Secondary | ICD-10-CM | POA: Diagnosis present

## 2021-10-18 DIAGNOSIS — Z7984 Long term (current) use of oral hypoglycemic drugs: Secondary | ICD-10-CM

## 2021-10-18 DIAGNOSIS — Z955 Presence of coronary angioplasty implant and graft: Secondary | ICD-10-CM

## 2021-10-18 DIAGNOSIS — E875 Hyperkalemia: Secondary | ICD-10-CM | POA: Diagnosis not present

## 2021-10-18 DIAGNOSIS — Z961 Presence of intraocular lens: Secondary | ICD-10-CM | POA: Diagnosis present

## 2021-10-18 DIAGNOSIS — Z7951 Long term (current) use of inhaled steroids: Secondary | ICD-10-CM

## 2021-10-18 DIAGNOSIS — E1122 Type 2 diabetes mellitus with diabetic chronic kidney disease: Secondary | ICD-10-CM | POA: Diagnosis present

## 2021-10-18 DIAGNOSIS — R3129 Other microscopic hematuria: Secondary | ICD-10-CM | POA: Diagnosis present

## 2021-10-18 DIAGNOSIS — N184 Chronic kidney disease, stage 4 (severe): Secondary | ICD-10-CM | POA: Diagnosis present

## 2021-10-18 DIAGNOSIS — I5030 Unspecified diastolic (congestive) heart failure: Secondary | ICD-10-CM | POA: Insufficient documentation

## 2021-10-18 DIAGNOSIS — N179 Acute kidney failure, unspecified: Secondary | ICD-10-CM | POA: Diagnosis not present

## 2021-10-18 DIAGNOSIS — Z950 Presence of cardiac pacemaker: Secondary | ICD-10-CM

## 2021-10-18 DIAGNOSIS — D631 Anemia in chronic kidney disease: Secondary | ICD-10-CM | POA: Diagnosis present

## 2021-10-18 DIAGNOSIS — E119 Type 2 diabetes mellitus without complications: Secondary | ICD-10-CM

## 2021-10-18 DIAGNOSIS — Z8551 Personal history of malignant neoplasm of bladder: Secondary | ICD-10-CM

## 2021-10-18 DIAGNOSIS — I130 Hypertensive heart and chronic kidney disease with heart failure and stage 1 through stage 4 chronic kidney disease, or unspecified chronic kidney disease: Secondary | ICD-10-CM | POA: Diagnosis present

## 2021-10-18 DIAGNOSIS — Z87891 Personal history of nicotine dependence: Secondary | ICD-10-CM

## 2021-10-18 DIAGNOSIS — I951 Orthostatic hypotension: Secondary | ICD-10-CM | POA: Diagnosis not present

## 2021-10-18 DIAGNOSIS — E785 Hyperlipidemia, unspecified: Secondary | ICD-10-CM | POA: Diagnosis present

## 2021-10-18 DIAGNOSIS — S32030A Wedge compression fracture of third lumbar vertebra, initial encounter for closed fracture: Principal | ICD-10-CM

## 2021-10-18 DIAGNOSIS — J449 Chronic obstructive pulmonary disease, unspecified: Secondary | ICD-10-CM | POA: Diagnosis present

## 2021-10-18 DIAGNOSIS — M549 Dorsalgia, unspecified: Secondary | ICD-10-CM

## 2021-10-18 DIAGNOSIS — K439 Ventral hernia without obstruction or gangrene: Secondary | ICD-10-CM | POA: Diagnosis present

## 2021-10-18 DIAGNOSIS — N289 Disorder of kidney and ureter, unspecified: Secondary | ICD-10-CM

## 2021-10-18 DIAGNOSIS — I4891 Unspecified atrial fibrillation: Secondary | ICD-10-CM | POA: Diagnosis present

## 2021-10-18 DIAGNOSIS — Z7982 Long term (current) use of aspirin: Secondary | ICD-10-CM

## 2021-10-18 DIAGNOSIS — R911 Solitary pulmonary nodule: Secondary | ICD-10-CM | POA: Diagnosis present

## 2021-10-18 DIAGNOSIS — I441 Atrioventricular block, second degree: Secondary | ICD-10-CM | POA: Diagnosis present

## 2021-10-18 DIAGNOSIS — N1831 Chronic kidney disease, stage 3a: Secondary | ICD-10-CM | POA: Diagnosis present

## 2021-10-18 DIAGNOSIS — I7143 Infrarenal abdominal aortic aneurysm, without rupture: Secondary | ICD-10-CM | POA: Diagnosis present

## 2021-10-18 DIAGNOSIS — C679 Malignant neoplasm of bladder, unspecified: Principal | ICD-10-CM | POA: Diagnosis present

## 2021-10-18 DIAGNOSIS — G8929 Other chronic pain: Secondary | ICD-10-CM | POA: Diagnosis present

## 2021-10-18 DIAGNOSIS — M5416 Radiculopathy, lumbar region: Secondary | ICD-10-CM | POA: Diagnosis present

## 2021-10-18 HISTORY — DX: Pain, unspecified: R52

## 2021-10-18 LAB — URINALYSIS, COMPLETE (UACMP) WITH MICROSCOPIC
Bilirubin Urine: NEGATIVE
Glucose, UA: NEGATIVE mg/dL
Ketones, ur: NEGATIVE mg/dL
Nitrite: NEGATIVE
Protein, ur: 100 mg/dL — AB
RBC / HPF: >50 RBC/hpf — ABNORMAL HIGH (ref 0–5)
Specific Gravity, Urine: 1.013 (ref 1.005–1.030)
Squamous Epithelial / HPF: NONE SEEN (ref 0–5)
WBC, UA: >50 WBC/hpf — ABNORMAL HIGH (ref 0–5)
pH: 5 (ref 5.0–8.0)

## 2021-10-18 LAB — BASIC METABOLIC PANEL
Anion gap: 10 (ref 5–15)
Anion gap: 6 (ref 5–15)
BUN: 59 mg/dL — ABNORMAL HIGH (ref 8–23)
BUN: 63 mg/dL — ABNORMAL HIGH (ref 8–23)
CO2: 20 mmol/L — ABNORMAL LOW (ref 22–32)
CO2: 21 mmol/L — ABNORMAL LOW (ref 22–32)
Calcium: 8.8 mg/dL — ABNORMAL LOW (ref 8.9–10.3)
Calcium: 9.1 mg/dL (ref 8.9–10.3)
Chloride: 101 mmol/L (ref 98–111)
Chloride: 108 mmol/L (ref 98–111)
Creatinine, Ser: 3.59 mg/dL — ABNORMAL HIGH (ref 0.61–1.24)
Creatinine, Ser: 3.66 mg/dL — ABNORMAL HIGH (ref 0.61–1.24)
GFR, Estimated: 15 mL/min — ABNORMAL LOW (ref >60)
GFR, Estimated: 15 mL/min — ABNORMAL LOW (ref >60)
Glucose, Bld: 110 mg/dL — ABNORMAL HIGH (ref 70–99)
Glucose, Bld: 107 mg/dL — ABNORMAL HIGH (ref 70–99)
Potassium: 5.4 mmol/L — ABNORMAL HIGH (ref 3.5–5.1)
Potassium: 5.4 mmol/L — ABNORMAL HIGH (ref 3.5–5.1)
Sodium: 131 mmol/L — ABNORMAL LOW (ref 135–145)
Sodium: 135 mmol/L (ref 135–145)

## 2021-10-18 LAB — CBC
HCT: 30.6 % — ABNORMAL LOW (ref 39.0–52.0)
Hemoglobin: 9.6 g/dL — ABNORMAL LOW (ref 13.0–17.0)
MCH: 30.4 pg (ref 26.0–34.0)
MCHC: 31.4 g/dL (ref 30.0–36.0)
MCV: 96.8 fL (ref 80.0–100.0)
Platelets: 155 10*3/uL (ref 150–400)
RBC: 3.16 MIL/uL — ABNORMAL LOW (ref 4.22–5.81)
RDW: 14.1 % (ref 11.5–15.5)
WBC: 8.3 10*3/uL (ref 4.0–10.5)
nRBC: 0 % (ref 0.0–0.2)

## 2021-10-18 LAB — SURGICAL PATHOLOGY

## 2021-10-18 MED ORDER — SENNOSIDES-DOCUSATE SODIUM 8.6-50 MG PO TABS: 1.0000 | ORAL_TABLET | Freq: Every evening | ORAL | Status: DC | PRN

## 2021-10-18 MED ORDER — SODIUM CHLORIDE 0.9 % IV SOLN: INTRAVENOUS | Status: AC

## 2021-10-18 MED ORDER — ONDANSETRON HCL 4 MG/2ML IJ SOLN: 4.0000 mg | Freq: Four times a day (QID) | INTRAMUSCULAR | Status: AC | PRN

## 2021-10-18 MED ORDER — OXYBUTYNIN CHLORIDE 5 MG PO TABS: 5.0000 mg | ORAL_TABLET | Freq: Three times a day (TID) | ORAL | Status: DC | PRN

## 2021-10-18 MED ORDER — OXYCODONE-ACETAMINOPHEN 5-325 MG PO TABS
1.0000 | ORAL_TABLET | Freq: Once | ORAL | Status: AC
  Administered 2021-10-18: 1 via ORAL
  Filled 2021-10-18: qty 1

## 2021-10-18 MED ORDER — ATORVASTATIN CALCIUM 20 MG PO TABS
40.0000 mg | ORAL_TABLET | Freq: Every day | ORAL | Status: DC
  Administered 2021-10-19 – 2021-10-28 (×9): 40 mg via ORAL
  Filled 2021-10-18 (×9): qty 2

## 2021-10-18 MED ORDER — ALBUTEROL SULFATE (2.5 MG/3ML) 0.083% IN NEBU: 2.5000 mg | INHALATION_SOLUTION | RESPIRATORY_TRACT | Status: DC | PRN

## 2021-10-18 MED ORDER — ACETAMINOPHEN 325 MG PO TABS: 650.0000 mg | ORAL_TABLET | Freq: Four times a day (QID) | ORAL | Status: AC | PRN

## 2021-10-18 MED ORDER — HYDROCODONE-ACETAMINOPHEN 5-325 MG PO TABS: 1.0000 | ORAL_TABLET | ORAL | Status: DC

## 2021-10-18 MED ORDER — MORPHINE SULFATE (PF) 4 MG/ML IV SOLN: 4.0000 mg | INTRAVENOUS | Status: DC | PRN

## 2021-10-18 MED ORDER — DIPHENHYDRAMINE HCL 50 MG/ML IJ SOLN: 12.5000 mg | Freq: Four times a day (QID) | INTRAMUSCULAR | Status: DC | PRN

## 2021-10-18 MED ORDER — SODIUM CHLORIDE 0.9 % IV SOLN: Freq: Once | INTRAVENOUS | Status: DC

## 2021-10-18 MED ORDER — BUDESON-GLYCOPYRROL-FORMOTEROL 160-9-4.8 MCG/ACT IN AERO: 1.0000 | INHALATION_SPRAY | RESPIRATORY_TRACT | Status: DC

## 2021-10-18 MED ORDER — SENNA 8.6 MG PO TABS
1.0000 | ORAL_TABLET | Freq: Once | ORAL | Status: AC
  Administered 2021-10-18: 8.6 mg via ORAL
  Filled 2021-10-18: qty 1

## 2021-10-18 MED ORDER — FENTANYL CITRATE PF 50 MCG/ML IJ SOSY: 12.5000 ug | PREFILLED_SYRINGE | INTRAMUSCULAR | Status: DC | PRN

## 2021-10-18 MED ORDER — ALBUTEROL SULFATE HFA 108 (90 BASE) MCG/ACT IN AERS: 1.0000 | INHALATION_SPRAY | RESPIRATORY_TRACT | Status: DC | PRN

## 2021-10-18 MED ORDER — ONDANSETRON HCL 4 MG PO TABS: 4.0000 mg | ORAL_TABLET | Freq: Four times a day (QID) | ORAL | Status: AC | PRN

## 2021-10-18 MED ORDER — SODIUM CHLORIDE 0.9 % IV SOLN
1.0000 g | INTRAVENOUS | Status: AC
  Administered 2021-10-18 – 2021-10-22 (×5): 1 g via INTRAVENOUS
  Filled 2021-10-18: qty 1
  Filled 2021-10-18: qty 10
  Filled 2021-10-18 (×3): qty 1

## 2021-10-18 MED ORDER — MORPHINE SULFATE (PF) 2 MG/ML IV SOLN
2.0000 mg | INTRAVENOUS | Status: AC | PRN
  Administered 2021-10-18 – 2021-10-19 (×3): 2 mg via INTRAVENOUS
  Filled 2021-10-18 (×3): qty 1

## 2021-10-18 MED ORDER — ACETAMINOPHEN 650 MG RE SUPP: 650.0000 mg | Freq: Four times a day (QID) | RECTAL | Status: AC | PRN

## 2021-10-18 MED ORDER — HEPARIN SODIUM (PORCINE) 5000 UNIT/ML IJ SOLN
5000.0000 [IU] | Freq: Three times a day (TID) | INTRAMUSCULAR | Status: DC
  Administered 2021-10-19 – 2021-10-28 (×27): 5000 [IU] via SUBCUTANEOUS
  Filled 2021-10-18 (×27): qty 1

## 2021-10-18 MED ORDER — AMLODIPINE BESYLATE 5 MG PO TABS
5.0000 mg | ORAL_TABLET | Freq: Every day | ORAL | Status: DC
  Administered 2021-10-19 – 2021-10-28 (×9): 5 mg via ORAL
  Filled 2021-10-18 (×9): qty 1

## 2021-10-18 MED ORDER — PANTOPRAZOLE SODIUM 40 MG PO TBEC: 80.0000 mg | DELAYED_RELEASE_TABLET | Freq: Every day | ORAL | Status: DC | PRN

## 2021-10-18 MED ORDER — OXYCODONE-ACETAMINOPHEN 7.5-325 MG PO TABS
1.0000 | ORAL_TABLET | Freq: Three times a day (TID) | ORAL | Status: AC | PRN
  Administered 2021-10-19 (×2): 1 via ORAL
  Filled 2021-10-18 (×2): qty 1

## 2021-10-18 NOTE — ED Provider Notes (Signed)
[facility] EMERGENCY DEPARTMENT Provider Note   CSN: [id number] Arrival date & time: 10/18/21  1518     History  Chief Complaint  Patient presents with   Back Pain    Jacob [name] is a 88 y.o. male with past medical history consisting of coronary artery disease, hyperlipidemia, renal insufficiency, hypertension, renal cancer, COPD, diabetes presents to the emergency department for severe midline low back pain.  He has a history of prior compression fracture with prior kyphoplasty at L1 back in January 2023 with good relief.  He is complaining of lower midline back pain consistent with his previous compression fracture pain.  Pain has been severe for the last week.  Pain started out as a mild ache 1 week ago but has progressed to severe constant pain that is affecting his ability to walk, stand and care for himself at home.  He lives at home with no assistance.  He has had a couple of family member stop by over the last couple days and try to care for him but he is very limited and unable to perform activities daily living despite assistance at home.  He has been taking 1-2 Norco tablets every 6 hours for pain, this was prescribed for his recent cystoscopy, stent placement, transurethral resection of bladder tumor yesterday on October 17, 2021.  Patient denies any numbness tingling or radicular symptoms.  No loss of bowel function.  No loss of bladder function.  He does complain of some constipation, states he has not had a bowel movement since Sunday, 2 days ago.  He denies any nausea or vomiting.  He is tolerating p.o. well.  Patient has a back brace that he is wearing that he purchased over-the-counter, he also has a TLSO brace from his previous compression fracture earlier this year but he states the TLSO brace causes him a lot of discomfort  HPI     Home Medications Prior to Admission medications   Medication Sig Start Date End Date Taking? Authorizing Provider   acetaminophen (TYLENOL) 325 MG tablet Take 650 mg by mouth every 4 (four) hours as needed.    [provider]  amLODipine (NORVASC) 5 MG tablet Take 1 tablet (5 mg total) by mouth daily. 09/27/21 12/26/21  Deboraha Sprang, MD  aspirin 81 MG tablet Take 81 mg by mouth daily.    [provider]  atorvastatin (LIPITOR) 40 MG tablet Take 1 tablet (40 mg total) by mouth daily. Needs appointment for future refills / 2nd attempt 10/05/21   Deboraha Sprang, MD  Budeson-Glycopyrrol-Formoterol (BREZTRI AEROSPHERE) 160-9-4.8 MCG/ACT AERO Inhale into the lungs in the morning and at bedtime.    [provider]  cetirizine (ZYRTEC) 10 MG tablet Take 10 mg by mouth as needed.    [provider]  empagliflozin (JARDIANCE) 10 MG TABS tablet Take 1 tablet (10 mg) by mouth once daily as directed 09/27/21   Deboraha Sprang, MD  esomeprazole (NEXIUM) 20 MG capsule Take 20 mg by mouth as needed. 05/24/20   [provider]  HYDROcodone-acetaminophen (NORCO/VICODIN) 5-325 MG tablet Take 1-2 tablets by mouth every 6 (six) hours as needed for moderate pain. 10/17/21   [name], MD  isosorbide mononitrate (IMDUR) 30 MG 24 hr tablet Take 30 mg by mouth at bedtime.    [provider]  latanoprost (XALATAN) 0.005 % ophthalmic solution Place 1 drop into both eyes at bedtime.    [provider]  Multiple Vitamins-Minerals (MULTIVITAMINS THER.  W/MINERALS) TABS Take 1 tablet by mouth daily.    [provider]  Omega-3 Fatty Acids (FISH OIL) 1200 MG CAPS Take 1,200 mg by mouth daily.    [provider]  oxybutynin (DITROPAN) 5 MG tablet Take 1 tablet (5 mg total) by mouth every 8 (eight) hours as needed for bladder spasms. 10/17/21   [name], MD  PROAIR HFA 108 [id number] Base) MCG/ACT inhaler Inhale 1 puff into the lungs every 4 (four) hours as needed.  06/14/16   [provider]  senna-docusate (SENOKOT-S) 8.6-50 MG per tablet Take 1 tablet  by mouth 2 (two) times daily. While taking pain meds to prevent constipation Patient taking differently: Take 1 tablet by mouth at bedtime as needed. While taking pain meds to prevent constipation 08/26/13   [name], MD  sulfamethoxazole-trimethoprim (BACTRIM DS) 800-160 MG tablet Take 1 tablet by mouth every 12 (twelve) hours. 10/10/21   [name], MD      Allergies    Penicillins    Review of Systems   Review of Systems  Physical Exam Updated Vital Signs BP (!) 114/45   Pulse 63   Temp 98.2 F (36.8 C) (Oral)   Resp 16   Wt 77.1 kg   SpO2 94%   BMI 25.10 kg/m  Physical Exam Constitutional:      Appearance: Normal appearance. He is well-developed.     Comments: Patient presents in a wheelchair.  Max assistance x2 needed to transfer from the chair to the bed.  HENT:     Head: Normocephalic and atraumatic.  Eyes:     Conjunctiva/sclera: Conjunctivae normal.  Cardiovascular:     Rate and Rhythm: Normal rate.  Pulmonary:     Effort: Pulmonary effort is normal. No respiratory distress.  Abdominal:     General: Bowel sounds are normal. There is no distension.     Tenderness: There is no abdominal tenderness. There is no guarding.  Musculoskeletal:        General: Normal range of motion.     Cervical back: Normal range of motion.     Comments: Point tenderness midline lumbar spine, no swelling or bruising.  He has full range of motion of both hips with no discomfort.  Neurovascular intact in bilateral lower extremities.  Skin:    General: Skin is warm.     Findings: No rash.  Neurological:     Mental Status: He is alert and oriented to person, place, and time.  Psychiatric:        Behavior: Behavior normal.        Thought Content: Thought content normal.     ED Results / Procedures / Treatments   Labs (all labs ordered are listed, but only abnormal results are displayed) Labs Reviewed  CBC - Abnormal; Notable for the following components:      Result  Value   RBC 3.16 (*)    Hemoglobin 9.6 (*)    HCT 30.6 (*)    All other components within normal limits  BASIC METABOLIC PANEL - Abnormal; Notable for the following components:   Sodium 131 (*)    Potassium 5.4 (*)    CO2 20 (*)    Glucose, Bld 110 (*)    BUN 59 (*)    Creatinine, Ser 3.59 (*)    Calcium 8.8 (*)    GFR, Estimated 15 (*)    All other components within normal limits  PHOSPHORUS  MAGNESIUM  BASIC METABOLIC PANEL  CBC  EKG None  Radiology DG Lumbar Spine Complete  Result Date: 10/18/2021 CLINICAL DATA:  Low back pain. History of lumbar fracture with vertebroplasty EXAM: LUMBAR SPINE - COMPLETE 4+ VIEW COMPARISON:  CT lumbar spine 01/18/2021 FINDINGS: Moderate to severe compression fracture of L1 with kyphoplasty cement in good position. Mild to moderate compression fracture of L3 is new since the prior CT and may be acute or subacute. Mild anterolisthesis at L4-5 with disc degeneration and spurring. Right ureteral stent.  No ureteral calculus identified IMPRESSION: 1. Mild to moderate compression fracture of L3, new since the prior CT and may be acute or subacute. 2. Chronic compression fracture L1 with kyphoplasty cement. Electronically Signed   By: Franchot Gallo M.D.   On: 10/18/2021 17:38   DG Thoracic Spine 2 View  Result Date: 10/18/2021 CLINICAL DATA:  Back pain EXAM: THORACIC SPINE 2 VIEWS COMPARISON:  Chest CT 03/18/2019 FINDINGS: Thoracic alignment within normal limits. Minimal chronic wedging at a proximate T8 level. Remaining vertebra demonstrate normal stature. Degenerative osteophytes. Post augmentation changes at L2. IMPRESSION: Degenerative changes.  No acute osseous abnormality Electronically Signed   By: Donavan Foil M.D.   On: 10/18/2021 17:37   DG OR UROLOGY CYSTO IMAGE ([facility] ONLY)  Result Date: 10/17/2021 There is no interpretation for this exam.  This order is for images obtained during a surgical procedure.  Please See "Surgeries" Tab for  more information regarding the procedure.    Procedures Procedures    Medications Ordered in ED Medications  acetaminophen (TYLENOL) tablet 650 mg (has no administration in time range)    Or  acetaminophen (TYLENOL) suppository 650 mg (has no administration in time range)  ondansetron (ZOFRAN) tablet 4 mg (has no administration in time range)    Or  ondansetron (ZOFRAN) injection 4 mg (has no administration in time range)  heparin injection 5,000 Units (has no administration in time range)  senna-docusate (Senokot-S) tablet 1 tablet (has no administration in time range)  oxyCODONE-acetaminophen (PERCOCET) 7.5-325 MG per tablet 1 tablet (has no administration in time range)  morphine (PF) 4 MG/ML injection 4 mg (has no administration in time range)  0.9 %  sodium chloride infusion (has no administration in time range)  senna (SENOKOT) tablet 8.6 mg (8.6 mg Oral Given 10/18/21 1950)  oxyCODONE-acetaminophen (PERCOCET/ROXICET) 5-325 MG per tablet 1 tablet (1 tablet Oral Given 10/18/21 1950)    ED Course/ Medical Decision Making/ A&P                           Medical Decision Making Amount and/or Complexity of Data Reviewed Labs: ordered. Radiology: ordered.  Risk OTC drugs. Prescription drug management.   88 year old male with acute midline low back pain.  X-rays today ordered and independently reviewed by me show evidence of acute L3 compression fracture that was not present on previous CT scan from 9 months ago.  There is approximately 50% loss of vertebral body height of L3.  He has had a successful L1 kyphoplasty 9 months ago.  Patient's midline low back pain is constant, severe and is having very little to no relief with oral narcotics at home.  He is unable to stand/walk and care for himself at home.  He lives at home alone with no assistance, is unable to care for himself at home nor keep pain controlled with oral pain medications.  Will discuss case with hospitalist recommend  admission for pain control. We will give oxycodone 5-3 25  here in the ED, the patient with no improvement with Norco at home.  We will also give Senokot for constipation.  Will have family member bring TLSO brace from home  Discussed case with hospitalist who is agreeable to admission. Patient blood work showing AKI with hyperkalemia.   Final Clinical Impression(s) / ED Diagnoses Final diagnoses:  Closed compression fracture of L3 lumbar vertebra, initial encounter ([place])  Severe back pain  Constipation, unspecified constipation type  Hyperkalemia  Acute kidney insufficiency    Rx / DC Orders ED Discharge Orders     None         Renata Caprice 10/18/21 2049    Harvest Dark, MD 10/18/21 2220

## 2021-10-18 NOTE — Assessment & Plan Note (Signed)
-   Amlodipine 5 mg daily resumed

## 2021-10-18 NOTE — Assessment & Plan Note (Signed)
-   Patient takes as needed PPI

## 2021-10-18 NOTE — Hospital Course (Signed)
Ms. Jacob [name] is a 88 year old male with history of L1 compression fracture, BPH, bladder tumor, status post TURPT, urocystoscopy, ureteral tumor biopsy, atrial fibrillation, hypertension, GERD, history of diverticulosis with diverticulitis, renal cell carcinoma status post left nephroureterectomy in August 2015, hyperlipidemia, Mobitz type II AV block, status postcardiac pacemaker placement in 2015, who presents emergency department for chief concerns of low back pain.  Initial vitals in the emergency department showed temperature of 98.2, respiration rate of 16, heart rate of 63, blood pressure 114/45, SPO2 of 94% on room air.  Serum sodium is 131, potassium 5.4, chloride 101, bicarb 20, BUN of 59, serum creatinine of 3.59, GFR 15, nonfasting blood glucose 110, WBC 8.3, hemoglobin 9.6, platelets of 155.  ED treatment: Oxycodone 5-325 mg p.o. one-time dose

## 2021-10-18 NOTE — ED Notes (Signed)
Pt given warm blankets to help with lower back pain

## 2021-10-18 NOTE — Assessment & Plan Note (Addendum)
-   Percocet 7.5-325 mg every 8 hours as needed for moderate pain, 2 doses ordered; morphine 4 mg IV every 4 hours as needed for severe pain, 3 doses ordered - Fentanyl 12.5 mcg IV every 3 hours as needed for severe pain refractory to p.o. medications and IV morphine, 3 doses ordered - AM team to reevaluate patient at bedside to determine continued opioid requirements - Admit to MedSurg, observation

## 2021-10-18 NOTE — Assessment & Plan Note (Addendum)
-   Present on admission - Pain control: Percocet 7.5 to 8 hours.  For moderate pain, 2 doses; morphine 4 mg IV every 4 hours, 3 doses; fentanyl 12.5 mcg every 3 hours as needed for severe pain to IV and p.o. pain medication  - TLSO brace use counseled, patient states that this is uncomfortable for him however he will wear it and he has asked his brother to bring it in tomorrow - Patient is requesting kyphoplasty, a.m. team to consult neurology and interventional radiology as appropriate

## 2021-10-18 NOTE — Assessment & Plan Note (Signed)
-   UA ordered - Ceftriaxone 1 g IV daily ordered

## 2021-10-18 NOTE — H&P (Signed)
History and Physical   Jacob [name]:[id number] DOB: May 19, 1928 DOA: 10/18/2021  PCP: Jacob Carls, MD  Outpatient Specialists: Dr. Caryl Comes, cardiology Patient coming from: Home  I have personally briefly reviewed patient's old medical records in [place].  Chief Concern: Back pain  HPI: Ms. Jacob [name] is a 88 year old male with history of L1 compression fracture, BPH, bladder tumor, status post TURPT, urocystoscopy, ureteral tumor biopsy, atrial fibrillation, hypertension, GERD, history of diverticulosis with diverticulitis, renal cell carcinoma status post left nephroureterectomy in August 2015, hyperlipidemia, Mobitz type II AV block, status postcardiac pacemaker placement in 2015, who presents emergency department for chief concerns of low back pain.  Initial vitals in the emergency department showed temperature of 98.2, respiration rate of 16, heart rate of 63, blood pressure 114/45, SPO2 of 94% on room air.  Serum sodium is 131, potassium 5.4, chloride 101, bicarb 20, BUN of 59, serum creatinine of 3.59, GFR 15, nonfasting blood glucose 110, WBC 8.3, hemoglobin 9.6, platelets of 155.  ED treatment: Oxycodone 5-325 mg p.o. one-time dose  At bedside he is awake alert and oriented to self, age, location, current calendar year.  He states that over the last week he has been having gradually worsening low back pain.  He denies any overt trauma.  He reports that he did have cystoscopy yesterday and he forced himself to undergo the procedure despite his lower back pain.  Last night he got up to use the restroom and if it were not for his stepdaughter who was staying in the house with him he could not get back to bed because of the pain.  He denies any fever, chills, chest pain, nausea, vomiting, abdominal pain, hematuria, diarrhea.  He endorses dysuria.  Social history: Has his own home and family stays with him to help.  He denies tobacco, EtOH, recreational drug  use.  ROS: Constitutional: no weight change, no fever ENT/Mouth: no sore throat, no rhinorrhea Eyes: no eye pain, no vision changes Cardiovascular: no chest pain, no dyspnea,  no edema, no palpitations Respiratory: no cough, no sputum, no wheezing Gastrointestinal: no nausea, no vomiting, no diarrhea, no constipation Genitourinary: no urinary incontinence, + dysuria, no hematuria Musculoskeletal: no arthralgias, + myalgias Skin: no skin lesions, no pruritus, Neuro: + weakness, no loss of consciousness, no syncope Psych: no anxiety, no depression, + decrease appetite Heme/Lymph: no bruising, no bleeding  ED Course: Discussed with him at [facility] medicine provider, patient requiring hospitalization for chief concerns of pain control.  Assessment/Plan  Principal Problem:   Inadequate pain control Active Problems:   Hyperlipidemia   Left bundle branch block   Urothelial carcinoma of kidney (HCC)   HTN (hypertension)   Renal cancer, left (HCC)   Mobitz type 2 second degree AV block   COPD (chronic obstructive pulmonary disease) (HCC)   GERD (gastroesophageal reflux disease)   Stage 3a chronic kidney disease (CKD) (HCC)   Type 2 diabetes mellitus without complication, without long-term current use of insulin (HCC)   Closed compression fracture of L3 vertebra (HCC)   Hyperkalemia   AKI (acute kidney injury) ([place])   Heart failure with preserved ejection fraction (HCC)   Dysuria   Assessment and Plan:  * Inadequate pain control - Percocet 7.5-325 mg every 8 hours as needed for moderate pain, 2 doses ordered; morphine 4 mg IV every 4 hours as needed for severe pain, 3 doses ordered - Fentanyl 12.5 mcg IV every 3 hours as needed for severe pain refractory to  p.o. medications and IV morphine, 3 doses ordered - AM team to reevaluate patient at bedside to determine continued opioid requirements - Admit to MedSurg, observation  Dysuria - UA ordered - Ceftriaxone 1 g IV daily  ordered  AKI (acute kidney injury) ([place]) - Patient was prescribed TMP-SMX post cystoscopy - Holding on admission due to acute kidney injury - Ceftriaxone 1 g IV daily ordered - Sodium chloride 125 mL bolus continuous for 15 hours ordered - Avoid nephrotoxic agents - Repeat BMP in the a.m.  Closed compression fracture of L3 vertebra (HCC) - Present on admission - Pain control: Percocet 7.5 to 8 hours.  For moderate pain, 2 doses; morphine 4 mg IV every 4 hours, 3 doses; fentanyl 12.5 mcg every 3 hours as needed for severe pain to IV and p.o. pain medication  - TLSO brace use counseled, patient states that this is uncomfortable for him however he will wear it and he has asked his brother to bring it in tomorrow - Patient is requesting kyphoplasty, a.m. team to consult neurology and interventional radiology as appropriate  GERD (gastroesophageal reflux disease) - Patient takes as needed PPI  HTN (hypertension) - Amlodipine 5 mg daily resumed  Hyperlipidemia - Atorvastatin 40 mg daily resumed  Chart reviewed.   DVT prophylaxis: Heparin 5000 units subcutaneous every 8 hours Code Status: Full code Diet: Heart healthy Family Communication: Updated brother at bedside with patient's permission Disposition Plan: Pending clinical course Consults called: None at this time Admission status: MedSurg, observation  Past Medical History:  Diagnosis Date   Anemia    Arthritis of both hands    BPH (benign prostatic hypertrophy)    COPD (chronic obstructive pulmonary disease) ([place])    Coronary artery disease    a.) LHC 11/18/2004: 40% pLAD, 50% mLAD, 80% D1, 50% mLCx, 100% mRCA - refer to [facility] for PCI; b.) LHC/PCI 11/21/2004: 100% mRCA (3.5 x 23 mm Cypher DES), 95% dRCA (2.5 x 28 mm Cypher); b.) 05/03/12 Cardiac CT: patent RCA stents, nonobs dzs;  c.) 01/2013 Ex MV: nl EF, no ischemia.   Diastolic dysfunction    a.) TTE 08/29/2013: EF 60-65%, mild LVH, mild BAE, mild MR, mod TR, G1DD; b.) TTE  07/01/2020: EF 50-55%, LAEm triv MR, mild-mod Ao sclerosis with no stenosis, G2DD   GERD (gastroesophageal reflux disease)    Glaucoma of both eyes    H/O hiatal hernia    History of atrial fibrillation 2006   History of diverticulitis    History of kidney stones    History of renal cell carcinoma    a.) s/p LEFT nephroureterectomy   Hyperlipidemia    Hypertension    LBBB (left bundle branch block)    Mobitz type 2 second degree AV block    a.) s/p PPM placement 08/29/2013   Pneumonia    Presence of permanent cardiac pacemaker    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 Creat rose to 2.34 post-op L nephrectomy.   S/P placement of cardiac pacemaker 08/29/2013   a.) Medtroinc device placed for symptomatic bradycardia and mobitz 2 second degree heart block   Symptomatic bradycardia    a.) s/p PPM placement 08/29/2013   Past Surgical History:  Procedure Laterality Date   APPENDECTOMY  02/03/1988   BLADDER INSTILLATION N/A 10/17/2021   Procedure: BLADDER INSTILLATION OF GEMCITABINE;  Surgeon: [name], MD;  Location: [facility];  Service: Urology;  Laterality: N/A;   CATARACT EXTRACTION W/ INTRAOCULAR LENS  IMPLANT, BILATERAL  01/02/2009   COLONOSCOPY     CORONARY ANGIOPLASTY WITH STENT PLACEMENT  11/21/2004   Procedure: CORONARY ANGIOPLASTY WITH STENT PLACEMENT (mRCA and dRCA); Location: [facility]; Surgeon: Maryjean Morn, MD   CYSTOSCOPY W/ RETROGRADES Right 01/13/2014   Procedure: CYSTOSCOPY WITH RETROGRADE PYELOGRAM;  Surgeon: [name], MD;  Location: [facility];  Service: Urology;  Laterality: Right;   CYSTOSCOPY W/ URETERAL STENT PLACEMENT Left 08/22/2013   Procedure: CYSTOSCOPY WITH RETROGRADE PYELOGRAM/URETERAL STENT PLACEMENT;  Surgeon: [name], MD;  Location: [facility];  Service: Urology;  Laterality: Left;   CYSTOSCOPY W/ URETERAL STENT PLACEMENT Right 10/17/2021    Procedure: CYSTOSCOPY WITH RETROGRADE PYELOGRAM/URETERAL STENT PLACEMENT;  Surgeon: [name], MD;  Location: [facility];  Service: Urology;  Laterality: Right;   CYSTOSCOPY WITH BIOPSY  04/13/2011   Procedure: CYSTOSCOPY WITH BIOPSY;  Surgeon: [name], MD;  Location: [facility];  Service: Urology;  Laterality: N/A;  Cold Cup Biopsy   CYSTOSCOPY WITH RETROGRADE PYELOGRAM, URETEROSCOPY AND STENT PLACEMENT Left 06/19/2013   Procedure: CYSTOSCOPY WITH LEFT RETROGRADE PYELOGRAM, LEFT URETEROSCOPY, ureteral balloon dilatation, BIOPSY LEFT KIDNEY;  Surgeon: [name], MD;  Location: [facility];  Service: Urology;  Laterality: Left;   INGUINAL HERNIA REPAIR Left 08/22/2013   Procedure: LAPAROSCOPIC INGUINAL HERNIA;  Surgeon: [name], MD;  Location: [facility];  Service: Urology;  Laterality: Left;   IR KYPHO LUMBAR INC FX REDUCE BONE BX UNI/BIL CANNULATION INC/IMAGING  01/31/2021   LEFT HEART CATH AND CORONARY ANGIOGRAPHY Left 11/18/2004   Procedure: LEFT HEART CATH AND CORONARY ANGIOGRAPHY; Location: [facility]; Surgeon: Neoma Laming, MD   PERMANENT PACEMAKER INSERTION N/A 08/29/2013   Procedure: PERMANENT PACEMAKER INSERTION;  Surgeon: [name], MD;  Location[facility];  Service: Cardiovascular;  Laterality: N/A;  Medtronic dual-chamber   ROBOT ASSITED LAPAROSCOPIC NEPHROURETERECTOMY Left 08/22/2013   Procedure: ROBOT ASSITED LAPAROSCOPIC NEPHROURETERECTOMY;  Surgeon: [name], MD;  Location: [facility];  Service: Urology;  Laterality: Left;   TRANSURETHRAL RESECTION OF BLADDER TUMOR  12/22/2010   TRANSURETHRAL RESECTION OF BLADDER TUMOR  08/04/2011   Procedure: TRANSURETHRAL RESECTION OF BLADDER TUMOR (TURBT);  Surgeon: [name], MD;  Location: [facility];  Service: Urology;  Laterality: N/A;   TRANSURETHRAL RESECTION OF BLADDER TUMOR N/A 01/13/2014   Procedure: TRANSURETHRAL RESECTION OF BLADDER TUMOR (TURBT);  Surgeon:  [name], MD;  Location: [facility];  Service: Urology;  Laterality: N/A;   TRANSURETHRAL RESECTION OF BLADDER TUMOR  10/17/2021   Procedure: TRANSURETHRAL RESECTION OF BLADDER TUMOR (TURBT);  Surgeon: [name], MD;  Location: [facility];  Service: Urology;;   URETERAL BIOPSY  10/17/2021   Procedure: URETERAL BIOPSY;  Surgeon: [name], MD;  Location: [facility];  Service: Urology;;   URETEROSCOPY  10/17/2021   Procedure: URETEROSCOPY;  Surgeon: [name], MD;  Location: [facility];  Service: Urology;;   Social History:  reports that he quit smoking about 20 years ago. His smoking use included cigarettes. He has a 50.00 pack-year smoking history. He has never used smokeless tobacco. He reports that he does not drink alcohol and does not use drugs.  Allergies  Allergen Reactions   Penicillins Hives    BLISTERS   Family History  Problem Relation Age of Onset   Heart attack Mother    Heart attack Father    Prostate cancer Neg Hx    Bladder Cancer Neg  Hx    Kidney cancer Neg Hx    Family history: Family history reviewed and not pertinent  Prior to Admission medications   Medication Sig Start Date End Date Taking? Authorizing Provider  acetaminophen (TYLENOL) 325 MG tablet Take 650 mg by mouth every 4 (four) hours as needed.    [provider]  amLODipine (NORVASC) 5 MG tablet Take 1 tablet (5 mg total) by mouth daily. 09/27/21 12/26/21  Deboraha Sprang, MD  aspirin 81 MG tablet Take 81 mg by mouth daily.    [provider]  atorvastatin (LIPITOR) 40 MG tablet Take 1 tablet (40 mg total) by mouth daily. Needs appointment for future refills / 2nd attempt 10/05/21   Deboraha Sprang, MD  Budeson-Glycopyrrol-Formoterol (BREZTRI AEROSPHERE) 160-9-4.8 MCG/ACT AERO Inhale into the lungs in the morning and at bedtime.    [provider]  cetirizine (ZYRTEC) 10 MG tablet Take 10 mg by mouth as needed.    [provider]   empagliflozin (JARDIANCE) 10 MG TABS tablet Take 1 tablet (10 mg) by mouth once daily as directed 09/27/21   Deboraha Sprang, MD  esomeprazole (NEXIUM) 20 MG capsule Take 20 mg by mouth as needed. 05/24/20   [provider]  HYDROcodone-acetaminophen (NORCO/VICODIN) 5-325 MG tablet Take 1-2 tablets by mouth every 6 (six) hours as needed for moderate pain. 10/17/21   [name], MD  isosorbide mononitrate (IMDUR) 30 MG 24 hr tablet Take 30 mg by mouth at bedtime.    [provider]  latanoprost (XALATAN) 0.005 % ophthalmic solution Place 1 drop into both eyes at bedtime.    [provider]  Multiple Vitamins-Minerals (MULTIVITAMINS THER. W/MINERALS) TABS Take 1 tablet by mouth daily.    [provider]  Omega-3 Fatty Acids (FISH OIL) 1200 MG CAPS Take 1,200 mg by mouth daily.    [provider]  oxybutynin (DITROPAN) 5 MG tablet Take 1 tablet (5 mg total) by mouth every 8 (eight) hours as needed for bladder spasms. 10/17/21   [name], MD  PROAIR HFA [phone] Base) MCG/ACT inhaler Inhale 1 puff into the lungs every 4 (four) hours as needed.  06/14/16   [provider]  senna-docusate (SENOKOT-S) 8.6-50 MG per tablet Take 1 tablet by mouth 2 (two) times daily. While taking pain meds to prevent constipation Patient taking differently: Take 1 tablet by mouth at bedtime as needed. While taking pain meds to prevent constipation 08/26/13   [name], MD  sulfamethoxazole-trimethoprim (BACTRIM DS) 800-160 MG tablet Take 1 tablet by mouth every 12 (twelve) hours. 10/10/21   [name], MD   Physical Exam: Vitals:   10/18/21 1647 10/18/21 1648 10/18/21 2225 10/18/21 2246  BP:  (!) 114/45 (!) 119/51 (!) 130/47  Pulse:  63 64 75  Resp:  16 16 20   Temp:  98.2 F (36.8 C) 98.5 F (36.9 C) 98.6 F (37 C)  TempSrc:  Oral Oral   SpO2:  94% 96% 95%  Weight: 77.1 kg      Constitutional: appears age-appropriate, frail, NAD, calm,  comfortable Eyes: PERRL, lids and conjunctivae normal ENMT: Mucous membranes are moist. Posterior pharynx clear of any exudate or lesions. Age-appropriate dentition. Hearing appropriate Neck: normal, supple, no masses, no thyromegaly Respiratory: clear to auscultation bilaterally, no wheezing, no crackles. Normal respiratory effort. No accessory muscle use.  Cardiovascular: Regular rate and rhythm, no murmurs / rubs / gallops. No extremity edema. 2+ pedal pulses. No carotid bruits.  Abdomen: no tenderness,  no masses palpated, no hepatosplenomegaly. Bowel sounds positive.  Musculoskeletal: no clubbing / cyanosis. No joint deformity upper and lower extremities.  Decreased range of motion of the bilateral lower extremity due to pain, no contractures, no atrophy. Normal muscle tone.  Skin: no rashes, lesions, ulcers. No induration Neurologic: Sensation intact. Strength 5/5 in all 4.  Psychiatric: Normal judgment and insight. Alert and oriented x 3. Normal mood.   EKG: Not indicated on admission  Chest x-ray on Admission: I personally reviewed and I agree with radiologist reading as below.  DG Lumbar Spine Complete  Result Date: 10/18/2021 CLINICAL DATA:  Low back pain. History of lumbar fracture with vertebroplasty EXAM: LUMBAR SPINE - COMPLETE 4+ VIEW COMPARISON:  CT lumbar spine 01/18/2021 FINDINGS: Moderate to severe compression fracture of L1 with kyphoplasty cement in good position. Mild to moderate compression fracture of L3 is new since the prior CT and may be acute or subacute. Mild anterolisthesis at L4-5 with disc degeneration and spurring. Right ureteral stent.  No ureteral calculus identified IMPRESSION: 1. Mild to moderate compression fracture of L3, new since the prior CT and may be acute or subacute. 2. Chronic compression fracture L1 with kyphoplasty cement. Electronically Signed   By: Franchot Gallo M.D.   On: 10/18/2021 17:38   DG Thoracic Spine 2 View  Result Date:  10/18/2021 CLINICAL DATA:  Back pain EXAM: THORACIC SPINE 2 VIEWS COMPARISON:  Chest CT 03/18/2019 FINDINGS: Thoracic alignment within normal limits. Minimal chronic wedging at a proximate T8 level. Remaining vertebra demonstrate normal stature. Degenerative osteophytes. Post augmentation changes at L2. IMPRESSION: Degenerative changes.  No acute osseous abnormality Electronically Signed   By: Donavan Foil M.D.   On: 10/18/2021 17:37   DG OR UROLOGY CYSTO IMAGE ([facility] ONLY)  Result Date: 10/17/2021 There is no interpretation for this exam.  This order is for images obtained during a surgical procedure.  Please See "Surgeries" Tab for more information regarding the procedure.    Labs on Admission: I have personally reviewed following labs  CBC: Recent Labs  Lab 10/12/21 1426 10/18/21 1951  WBC 6.0 8.3  HGB 10.0* 9.6*  HCT 31.1* 30.6*  MCV 96.3 96.8  PLT 188 962   Basic Metabolic Panel: Recent Labs  Lab 10/12/21 1426 10/18/21 1951  NA 138 131*  K 4.3 5.4*  CL 107 101  CO2 25 20*  GLUCOSE 107* 110*  BUN 30* 59*  CREATININE 2.12* 3.59*  CALCIUM 8.8* 8.8*   GFR: Estimated Creatinine Clearance: 13.4 mL/min (A) (by C-G formula based on SCr of 3.59 mg/dL (H)).  Urine analysis:    Component Value Date/Time   COLORURINE YELLOW 01/25/2021 1939   APPEARANCEUR Clear 10/05/2021 1407   LABSPEC 1.010 01/25/2021 1939   LABSPEC 1.035 07/07/2011 1544   PHURINE 5.0 01/25/2021 1939   GLUCOSEU 2+ (A) 10/05/2021 1407   GLUCOSEU Negative 07/07/2011 1544   HGBUR MODERATE (A) 01/25/2021 1939   BILIRUBINUR Negative 10/05/2021 1407   BILIRUBINUR Negative 07/07/2011 1544   KETONESUR NEGATIVE 01/25/2021 1939   PROTEINUR 1+ (A) 10/05/2021 1407   PROTEINUR TRACE (A) 01/25/2021 1939   UROBILINOGEN 0.2 08/28/2013 1650   NITRITE Negative 10/05/2021 1407   NITRITE NEGATIVE 01/25/2021 1939   LEUKOCYTESUR Negative 10/05/2021 1407   LEUKOCYTESUR NEGATIVE 01/25/2021 1939   LEUKOCYTESUR Negative  07/07/2011 1544   Dr. Tobie Poet [facility]  If 7PM-7AM, please contact overnight-coverage provider If 7AM-7PM, please contact day coverage provider [email or web address]  10/18/2021, 10:51 PM

## 2021-10-18 NOTE — ED Triage Notes (Signed)
Pt arrives with c/o lower back pain that's started a few days ago. Pt has hx of fractured vertebrae.

## 2021-10-18 NOTE — ED Notes (Signed)
Patient is ready for transport.  

## 2021-10-18 NOTE — Assessment & Plan Note (Signed)
-   Patient was prescribed TMP-SMX post cystoscopy - Holding on admission due to acute kidney injury - Ceftriaxone 1 g IV daily ordered - Sodium chloride 125 mL bolus continuous for 15 hours ordered - Avoid nephrotoxic agents - Repeat BMP in the a.m.

## 2021-10-18 NOTE — Assessment & Plan Note (Signed)
-   Atorvastatin 40 mg daily resumed

## 2021-10-19 ENCOUNTER — Observation Stay: Payer: Medicare Other

## 2021-10-19 DIAGNOSIS — R52 Pain, unspecified: Secondary | ICD-10-CM | POA: Diagnosis not present

## 2021-10-19 LAB — PROCALCITONIN: Procalcitonin: 0.24 ng/mL

## 2021-10-19 LAB — CBC
HCT: 28 % — ABNORMAL LOW (ref 39.0–52.0)
Hemoglobin: 8.8 g/dL — ABNORMAL LOW (ref 13.0–17.0)
MCH: 30 pg (ref 26.0–34.0)
MCHC: 31.4 g/dL (ref 30.0–36.0)
MCV: 95.6 fL (ref 80.0–100.0)
Platelets: 151 10*3/uL (ref 150–400)
RBC: 2.93 MIL/uL — ABNORMAL LOW (ref 4.22–5.81)
RDW: 13.9 % (ref 11.5–15.5)
WBC: 7.1 10*3/uL (ref 4.0–10.5)
nRBC: 0 % (ref 0.0–0.2)

## 2021-10-19 LAB — PHOSPHORUS: Phosphorus: 6 mg/dL — ABNORMAL HIGH (ref 2.5–4.6)

## 2021-10-19 LAB — MAGNESIUM: Magnesium: 2.4 mg/dL (ref 1.7–2.4)

## 2021-10-19 MED ORDER — POLYETHYLENE GLYCOL 3350 17 G PO PACK
17.0000 g | PACK | Freq: Every day | ORAL | Status: DC
  Administered 2021-10-19 – 2021-10-28 (×9): 17 g via ORAL
  Filled 2021-10-19 (×9): qty 1

## 2021-10-19 MED ORDER — UMECLIDINIUM BROMIDE 62.5 MCG/ACT IN AEPB
1.0000 | INHALATION_SPRAY | Freq: Every day | RESPIRATORY_TRACT | Status: DC
  Administered 2021-10-19 – 2021-10-28 (×9): 1 via RESPIRATORY_TRACT
  Filled 2021-10-19: qty 7

## 2021-10-19 MED ORDER — SODIUM CHLORIDE 0.9 % IV SOLN: INTRAVENOUS | Status: AC

## 2021-10-19 MED ORDER — FLUTICASONE FUROATE-VILANTEROL 200-25 MCG/ACT IN AEPB
1.0000 | INHALATION_SPRAY | Freq: Every day | RESPIRATORY_TRACT | Status: DC
  Administered 2021-10-19 – 2021-10-28 (×9): 1 via RESPIRATORY_TRACT
  Filled 2021-10-19: qty 28

## 2021-10-19 MED ORDER — SENNOSIDES-DOCUSATE SODIUM 8.6-50 MG PO TABS
1.0000 | ORAL_TABLET | Freq: Two times a day (BID) | ORAL | Status: DC
  Administered 2021-10-19 – 2021-10-28 (×18): 1 via ORAL
  Filled 2021-10-19 (×18): qty 1

## 2021-10-19 MED ORDER — BISACODYL 10 MG RE SUPP: 10.0000 mg | Freq: Every day | RECTAL | Status: DC | PRN

## 2021-10-19 NOTE — Progress Notes (Addendum)
[facility]  ROUNDING NOTE   Subjective:   Jacob [name] is a 88 y.o. male with past medical history including past medical history includes atrial fibrillation, GERD, hypertension, BPH, bladder tumor status post TURPT, urethral tumor biopsy, urocystoscopy and  chronic kidney disease stage IV.  Patient presents to the emergency department with complaints of back pain and has been admitted under observation for Hyperkalemia [E87.5] Acute kidney insufficiency [N28.9] Inadequate pain control [R52] Severe back pain [M54.9] Closed compression fracture of L3 lumbar vertebra, initial encounter ([place]) [S32.030A] Constipation, unspecified constipation type [K59.00]  Patient is known to our practice and is followed outpatient by Dr. Candiss Norse.  Last seen in office in July of this year with worsening renal function noted.  Patient does follow with outpatient urology as well.  Patient seen and evaluated at bedside.  Brother at bedside.  Patient states he has chronic back pain and his oral medications were not managing his pain recently.  Denies any recent activities to aggravate pain.  Patient states he recently underwent a bladder procedure on Monday, causing current bloody appearance of urine.  Denies pain or discomfort with urination.  Appetite currently intact, denies nausea or vomiting.  Denies shortness of breath, remains on room air at baseline.  Patient placed on 2 L nasal cannula for comfort.  Labs on ED arrival significant for glucose 107, BUN 30, creatinine 2.12 with GFR 29, hemoglobin 10.0.  Lumbar spine x-ray shows mild to moderate compression fracture of L3, new since previous CT, and chronic compression fracture L1 with kyphoplasty cement.  Thoracic spine x-ray shows degenerative changes only.  We have been consulted to evaluate acute kidney injury.Patient was seen today on first floor Patient main concern continues to be back pain Patient offers no complaint of hematuria No  complaint of shortness of breath No complaint of fever/cough or chills I then discussed with the patient about his kidney related issues    Objective:  Vital signs in last 24 hours:  Temp:  [98.2 F (36.8 C)-98.7 F (37.1 C)] 98.7 F (37.1 C) (10/18 0733) Pulse Rate:  [63-77] 77 (10/18 0733) Resp:  [16-20] 16 (10/18 0733) BP: (114-144)/(45-51) 144/49 (10/18 0733) SpO2:  [94 %-98 %] 98 % (10/18 0733) Weight:  [77.1 kg] 77.1 kg (10/17 1647)  Weight change:  Filed Weights   10/18/21 1647  Weight: 77.1 kg    Intake/Output: I/O last 3 completed shifts: In: 763.7 [I.V.:663.7; IV Piggyback:100] Out: 400 [Urine:400]   Intake/Output this shift:  Total I/O In: 240 [P.O.:240] Out: 1300 [Urine:1300]  Physical Exam: General: NAD, resting comfortably  Head: Normocephalic, atraumatic. Moist oral mucosal membranes  Eyes: Anicteric  Lungs:  Clear to auscultation, normal effort, [employer] O2  Heart: Regular rate and rhythm  Abdomen:  Soft, nontender  Extremities: No peripheral edema.  Neurologic: Nonfocal, moving all four extremities  Skin: No lesions  Access: None    Basic Metabolic Panel: Recent Labs  Lab 10/12/21 1426 10/18/21 1951 10/18/21 2302  NA 138 131* 135  K 4.3 5.4* 5.4*  CL 107 101 108  CO2 25 20* 21*  GLUCOSE 107* 110* 107*  BUN 30* 59* 63*  CREATININE 2.12* 3.59* 3.66*  CALCIUM 8.8* 8.8* 9.1  MG  --   --  2.4  PHOS  --   --  6.0*    Liver Function Tests: No results for input(s): "AST", "ALT", "ALKPHOS", "BILITOT", "PROT", "ALBUMIN" in the last 168 hours. No results for input(s): "LIPASE", "AMYLASE" in the last 168  hours. No results for input(s): "AMMONIA" in the last 168 hours.  CBC: Recent Labs  Lab 10/12/21 1426 10/18/21 1951 10/19/21 0026  WBC 6.0 8.3 7.1  HGB 10.0* 9.6* 8.8*  HCT 31.1* 30.6* 28.0*  MCV 96.3 96.8 95.6  PLT 188 155 151    Cardiac Enzymes: No results for input(s): "CKTOTAL", "CKMB", "CKMBINDEX", "TROPONINI" in the last 168  hours.  BNP: Invalid input(s): "POCBNP"  CBG: No results for input(s): "GLUCAP" in the last 168 hours.  Microbiology: Results for orders placed or performed in visit on 10/05/21  Microscopic Examination     Status: None   Collection Time: 10/05/21  2:07 PM   Urine  Result Value Ref Range Status   WBC, UA 0-5 0 - 5 /hpf Final   RBC, Urine 0-2 0 - 2 /hpf Final   Epithelial Cells (non renal) 0-10 0 - 10 /hpf Final   Bacteria, UA None seen None seen/Few Final  CULTURE, URINE COMPREHENSIVE     Status: Abnormal   Collection Time: 10/05/21  3:07 PM   Specimen: Urine   UR  Result Value Ref Range Status   Urine Culture, Comprehensive Final report (A)  Final   Organism ID, Bacteria Proteus mirabilis (A)  Final    Comment: Cefazolin <=4 ug/mL Cefazolin with an MIC <=16 predicts susceptibility to the oral agents cefaclor, cefdinir, cefpodoxime, cefprozil, cefuroxime, cephalexin, and loracarbef when used for therapy of uncomplicated urinary tract infections due to E. coli, Klebsiella pneumoniae, and Proteus mirabilis. 10,000-25,000 colony forming units per mL    ANTIMICROBIAL SUSCEPTIBILITY Comment  Final    Comment:       ** S = Susceptible; I = Intermediate; R = Resistant **                    P = Positive; N = Negative             MICS are expressed in micrograms per mL    Antibiotic                 RSLT#1    RSLT#2    RSLT#3    RSLT#4 Amoxicillin/Clavulanic Acid    S Ampicillin                     S Cefepime                       S Ceftriaxone                    S Cefuroxime                     S Ciprofloxacin                  S Ertapenem                      S Gentamicin                     S Levofloxacin                   S Meropenem                      S Nitrofurantoin                 R Piperacillin/Tazobactam        S Tetracycline  R Tobramycin                     S Trimethoprim/Sulfa             S     Coagulation Studies: No results for input(s):  "LABPROT", "INR" in the last 72 hours.  Urinalysis: Recent Labs    10/18/21 2303  COLORURINE AMBER*  LABSPEC 1.013  PHURINE 5.0  GLUCOSEU NEGATIVE  HGBUR MODERATE*  BILIRUBINUR NEGATIVE  KETONESUR NEGATIVE  PROTEINUR 100*  NITRITE NEGATIVE  LEUKOCYTESUR SMALL*      Imaging: CT ABDOMEN PELVIS WO CONTRAST  Addendum Date: 10/19/2021   ADDENDUM REPORT: 10/19/2021 12:23 ADDENDUM: These results were called by telephone at the time of interpretation on 10/19/2021 at 12:23 pm to provider [facility] , who verbally acknowledged these results. Electronically Signed   By: Zetta Bills M.D.   On: 10/19/2021 12:23   Result Date: 10/19/2021 CLINICAL DATA:  Urothelial carcinoma post LEFT nephrectomy. Post recent TURBT. * Tracking Code: BO * EXAM: CT ABDOMEN AND PELVIS WITHOUT CONTRAST TECHNIQUE: Multidetector CT imaging of the abdomen and pelvis was performed following the standard protocol without IV contrast. RADIATION DOSE REDUCTION: This exam was performed according to the departmental dose-optimization program which includes automated exposure control, adjustment of the mA and/or kV according to patient size and/or use of iterative reconstruction technique. COMPARISON:  January 25, 2021. FINDINGS: Lower chest: Trace LEFT and small RIGHT-sided pleural fluid. Basilar scarring in the lingula and at the LEFT lung base. Cardiac pacer leads in the RIGHT heart. Heart is incompletely imaged. No pericardial effusion. No chest wall abnormality. Hepatobiliary: Lobulated area in the medial segment of the LEFT hepatic lobe (image 20/2) slightly more dense than surrounding liver 3.1 x 2.2 cm, unchanged dating back to November of 2012 and as far back as May of 2015, unchanged in size compared to the May of 2015 study but with increased density, shown to represent a cyst at the time of the 2015 evaluation. No pericholecystic stranding. No biliary duct dilation. Pancreas: Pancreas with normal contours, no  signs of inflammation or peripancreatic fluid. Spleen: Normal size and contour, spleen has fall in into the LEFT nephrectomy bed. Adrenals/Urinary Tract: RIGHT adrenal gland is normal. Status post LEFT nephrectomy. LEFT adrenal gland with small LEFT adrenal nodule which is unchanged over a series of prior studies measuring 10 Hounsfield units. Renal cortical scarring on the RIGHT which is moderate. Mildly patulous appearance of the RIGHT ureter without hydronephrosis and with partial reconstitution of the proximal loop of the RIGHT nephroureteral stent and full reconstitution of the distal loop. Proximal loop in anterior interpolar calices and distal loops well reconstituted in the urinary bladder. No signs of perivesical stranding. Small amount of gas in the urinary bladder. Stomach/Bowel: No acute gastrointestinal findings. Herniation of the mid transverse colon into the wide mouth ventral hernia and rectus diastasis in the mid abdomen (image 48/2) 6 cm defect in the abdominal wall. No acute gastric or small bowel process. Vascular/Lymphatic: 3 cm x 3 cm infrarenal abdominal aortic aneurysm in the setting of moderate to marked calcified aortic atherosclerosis. Vascular structures not well assessed due to lack of intravenous contrast. No adenopathy in the retroperitoneum. No adenopathy in the pelvis. Reproductive: Unremarkable by CT. Other: Mild stranding in the presacral space and along the RIGHT pelvic sidewall and RIGHT retroperitoneum. Mild RIGHT perinephric stranding. No pneumoperitoneum. No ascites. Musculoskeletal: Compression fracture at L4. This is new compared to imaging from January 25, 2021 and shows 60-70% loss of height. Mild retropulsion of posterior cortical elements along the superior endplate. Fracture involves superior endplate of L4. Alignment is unchanged and there are signs of cement augmentation at L2. Segmentation referenced above assumes 5 lumbar type vertebral bodies with L2 as the level  previously treated with cement augmentation with transitional vertebral anatomy at S1. IMPRESSION: 1. Status post LEFT nephrectomy. 2. Post nephroureteral stenting with stranding in the retroperitoneum and pelvis likely related to recent TURBT and ureteral manipulation. 3. Small amount of gas in the urinary bladder related to above procedure. 4. Compression fracture at "L4" is new compared to imaging from January 25, 2021 and shows 60-70% loss of height. Mild retropulsion of posterior cortical elements along the superior endplate. Segmentation assumes S1 is a transitional vertebra as outlined on previous CT of the lumbar spine from January 2023. 5. Trace LEFT and small RIGHT-sided pleural fluid. 6. Herniation of the mid transverse colon into the wide mouth ventral hernia and rectus diastasis in the mid abdomen. 6 cm defect in the abdominal wall. 7. 3 cm x 3 cm infrarenal abdominal aortic aneurysm in the setting of moderate to marked calcified aortic atherosclerosis. Recommend follow-up every 3 years. 8. Stable hepatic lesion in the medial segment of the LEFT hepatic lobe in terms of size dating back to 2015 likely reflecting a hemorrhagic cyst, internal hemorrhage or complexity developing over time. Aortic Atherosclerosis (ICD10-I70.0). These results will be called to the ordering clinician or representative by the Radiologist Assistant, and communication documented in the PACS or [employer]. Electronically Signed: By: Zetta Bills M.D. On: 10/19/2021 11:43   DG Lumbar Spine Complete  Result Date: 10/18/2021 CLINICAL DATA:  Low back pain. History of lumbar fracture with vertebroplasty EXAM: LUMBAR SPINE - COMPLETE 4+ VIEW COMPARISON:  CT lumbar spine 01/18/2021 FINDINGS: Moderate to severe compression fracture of L1 with kyphoplasty cement in good position. Mild to moderate compression fracture of L3 is new since the prior CT and may be acute or subacute. Mild anterolisthesis at L4-5 with disc  degeneration and spurring. Right ureteral stent.  No ureteral calculus identified IMPRESSION: 1. Mild to moderate compression fracture of L3, new since the prior CT and may be acute or subacute. 2. Chronic compression fracture L1 with kyphoplasty cement. Electronically Signed   By: Franchot Gallo M.D.   On: 10/18/2021 17:38   DG Thoracic Spine 2 View  Result Date: 10/18/2021 CLINICAL DATA:  Back pain EXAM: THORACIC SPINE 2 VIEWS COMPARISON:  Chest CT 03/18/2019 FINDINGS: Thoracic alignment within normal limits. Minimal chronic wedging at a proximate T8 level. Remaining vertebra demonstrate normal stature. Degenerative osteophytes. Post augmentation changes at L2. IMPRESSION: Degenerative changes.  No acute osseous abnormality Electronically Signed   By: Donavan Foil M.D.   On: 10/18/2021 17:37     Medications:    sodium chloride     cefTRIAXone (ROCEPHIN)  IV 1 g (10/18/21 2315)    amLODipine  5 mg Oral Daily   atorvastatin  40 mg Oral Daily   fluticasone furoate-vilanterol  1 puff Inhalation Daily   And   umeclidinium bromide  1 puff Inhalation Daily   heparin  5,000 Units Subcutaneous Q8H   polyethylene glycol  17 g Oral Daily   senna-docusate  1 tablet Oral BID   acetaminophen **OR** acetaminophen, albuterol, bisacodyl, diphenhydrAMINE, fentaNYL (SUBLIMAZE) injection, morphine injection, ondansetron **OR** ondansetron (ZOFRAN) IV, oxybutynin, oxyCODONE-acetaminophen, pantoprazole  Assessment/ Plan:  [name] is a 88 y.o.  male  with past medical history including past medical history includes atrial fibrillation, GERD, hypertension, BPH, bladder tumor status post TURPT, urethral tumor biopsy, urocystoscopy and chronic kidney disease stage IV.  Patient presents to the emergency department with complaints of back pain and has been admitted under observation for Hyperkalemia [E87.5] Acute kidney insufficiency [N28.9] Inadequate pain control [R52] Severe back pain  [M54.9] Closed compression fracture of L3 lumbar vertebra, initial encounter ([place]) [S32.030A] Constipation, unspecified constipation type [K59.00]   Acute Kidney Injury with hyperkalemia on chronic kidney disease stage IV with baseline creatinine 1.95 and GFR of 32 on 02/01/21.  Patient is s/p left nephrectomy Acute kidney etiology unclear at this time. Worsening renal function noted outpatient.  Chronic kidney disease is secondary to hypertensive nephropathy. CT Abd/Pelvis negative for obstruction. Agree with holding Bactrim and offering IV fluids for now. Creatinine elevated this admission, 3.66. Adequate urine output noted, 1.3L today. Potassium 5.4. Will order lokelma 10mg  once.  No acute need for dialysis at this time. Avoid nephrotoxic agents and therapies, if possible.   Lab Results  Component Value Date   CREATININE 3.66 (H) 10/18/2021   CREATININE 3.59 (H) 10/18/2021   CREATININE 2.12 (H) 10/12/2021    Intake/Output Summary (Last 24 hours) at 10/19/2021 1352 Last data filed at 10/19/2021 1037 Gross per 24 hour  Intake 1003.69 ml  Output 1700 ml  Net -696.31 ml   2. Hypertension with chronic kidney disease. Home regimen includes amlodipine and isosorbide. Only receiving amlodipine at this time. Blood pressure remains stable for this patient.   3. Dysuria likely due to recent [place]. Bactrim stopped in setting of kidney injury, receiving Rocephin. Primary team to manage.   4. Anemia of chronic kidney disease: Normocytic Lab Results  Component Value Date   HGB 8.8 (L) 10/19/2021    Hemoglobin below desired target, will hold ESA or iron supplementation due to concern for malignancy.   5. Hyperphosphatemia, outpatient phosphorus 3.7 on 01/04/21. Currently 6.0. Will continue to monitor and assess need for binder.    LOS: 0 Shantelle Breeze 10/18/20231:52 PM

## 2021-10-19 NOTE — Plan of Care (Signed)
  Problem: Education: Goal: Knowledge of General Education information will improve Description: Including pain rating scale, medication(s)/side effects and non-pharmacologic comfort measures Outcome: Progressing   Problem: Health Behavior/Discharge Planning: Goal: Ability to manage health-related needs will improve Outcome: Progressing   Problem: Clinical Measurements: Goal: Will remain free from infection Outcome: Progressing Goal: Diagnostic test results will improve Outcome: Progressing Goal: Respiratory complications will improve Outcome: Progressing   Problem: Nutrition: Goal: Adequate nutrition will be maintained Outcome: Progressing   Problem: Coping: Goal: Level of anxiety will decrease Outcome: Progressing   Problem: Pain Managment: Goal: General experience of comfort will improve Outcome: Progressing   Problem: Safety: Goal: Ability to remain free from injury will improve Outcome: Progressing   Problem: Skin Integrity: Goal: Risk for impaired skin integrity will decrease Outcome: Progressing

## 2021-10-19 NOTE — Progress Notes (Signed)
PROGRESS NOTE    Jacob [name]  XNT:[id number] DOB: [birth year] DOA: 10/18/2021 PCP: [name], MD    Brief Narrative:  88 year old male with history of L1 compression fracture, BPH, bladder tumor, status post TURPT, urocystoscopy, ureteral tumor biopsy, atrial fibrillation, hypertension, GERD, history of diverticulosis with diverticulitis, renal cell carcinoma status post left nephroureterectomy in August 2015, hyperlipidemia, Mobitz type II AV block, status postcardiac pacemaker placement in 2015, who presents emergency department for chief concerns of low back pain.  Pursued CT abdomen pelvis given concern for possible lytic lesions of metastatic disease.  Study limited by lack of IV contrast however no metastatic disease noted.  There was L4 compression fracture with 60 to 70% height loss.  Also with AKI on CKD stage IV.  Nephrology consult requested.     Assessment & Plan:   Principal Problem:   Inadequate pain control Active Problems:   Hyperlipidemia   Left bundle branch block   Urothelial carcinoma of kidney (HCC)   HTN (hypertension)   Renal cancer, left (HCC)   Mobitz type 2 second degree AV block   COPD (chronic obstructive pulmonary disease) (HCC)   GERD (gastroesophageal reflux disease)   Stage 3a chronic kidney disease (CKD) (HCC)   Type 2 diabetes mellitus without complication, without long-term current use of insulin (HCC)   Closed compression fracture of L3 vertebra (HCC)   Hyperkalemia   AKI (acute kidney injury) ([place])   Heart failure with preserved ejection fraction (HCC)   Dysuria  Intractable back pain L4 compression fracture Patient has had poor tolerance of p.o. narcotic regimen.  States it constipates him and does not control his pain.  TLSO brace counseled.  Patient states this is uncomfortable. Plan: IR consult to consider kyphoplasty Multimodal pain control Bowel regimen  AKI on CKD stage IV Patient was prescribed Bactrim prior to  cystoscopy.  This has been held.  He is established with Dr. Murlean Iba for outpatient nephrology. Plan: Resume IV fluids, normal saline at 75 cc/h x 16 hours Request nephrology consult  Dysuria Unclear etiology.  Unable to exclude urinary tract infection at this time.  Could be secondary to recent bladder instrumentation Plan: Continue IV Rocephin for now We will check procalcitonin If procalcitonin negative consider discontinuation of antibiotics  GERD PPI  Hypertension PTA Norvasc  Hyperlipidemia PTA Lipitor   DVT prophylaxis: SQ heparin Code Status: Full Family Communication: None today Disposition Plan: Status is: Observation The patient will require care spanning > 2 midnights and should be moved to inpatient because: Intractable back pain.  Inability to ambulate.  AKI on CKD stage IV.  Pending IR consult and evaluation for kyphoplasty.   Level of care: Med-Surg  Consultants:  IR Nephrology  Procedures:  None none  Antimicrobials: Ceftriaxone   Subjective: Seen and examined.  Resting in bed.  Any movement causes pain.  Endorses constipation.  Objective: Vitals:   10/18/21 1648 10/18/21 2225 10/18/21 2246 10/19/21 0733  BP: (!) 114/45 (!) 119/51 (!) 130/47 (!) 144/49  Pulse: 63 64 75 77  Resp: 16 16 20 16   Temp: 98.2 F (36.8 C) 98.5 F (36.9 C) 98.6 F (37 C) 98.7 F (37.1 C)  TempSrc: Oral Oral Oral   SpO2: 94% 96% 95% 98%  Weight:        Intake/Output Summary (Last 24 hours) at 10/19/2021 1302 Last data filed at 10/19/2021 1037 Gross per 24 hour  Intake 1003.69 ml  Output 1700 ml  Net -696.31 ml   [employer]  10/18/21 1647  Weight: 77.1 kg    Examination:  General exam: Appears calm and comfortable  Respiratory system: Clear to auscultation. Respiratory effort normal. Cardiovascular system: S1-2, RRR, no murmurs, no pedal edema Gastrointestinal system: Abdomen is nondistended, soft and nontender. No organomegaly or masses  felt. Normal bowel sounds heard. Central nervous system: Alert and oriented. No focal neurological deficits. Extremities: Decreased trunk bilateral lower extremities.  Decreased ROM secondary to pain Skin: No rashes, lesions or ulcers Psychiatry: Judgement and insight appear normal. Mood & affect appropriate.     Data Reviewed: I have personally reviewed following labs and imaging studies  CBC: Recent Labs  Lab 10/12/21 1426 10/18/21 1951 10/19/21 0026  WBC 6.0 8.3 7.1  HGB 10.0* 9.6* 8.8*  HCT 31.1* 30.6* 28.0*  MCV 96.3 96.8 95.6  PLT 188 155 545   Basic Metabolic Panel: Recent Labs  Lab 10/12/21 1426 10/18/21 1951 10/18/21 2302  NA 138 131* 135  K 4.3 5.4* 5.4*  CL 107 101 108  CO2 25 20* 21*  GLUCOSE 107* 110* 107*  BUN 30* 59* 63*  CREATININE 2.12* 3.59* 3.66*  CALCIUM 8.8* 8.8* 9.1  MG  --   --  2.4  PHOS  --   --  6.0*   GFR: Estimated Creatinine Clearance: 13.1 mL/min (A) (by C-G formula based on SCr of 3.66 mg/dL (H)). Liver Function Tests: No results for input(s): "AST", "ALT", "ALKPHOS", "BILITOT", "PROT", "ALBUMIN" in the last 168 hours. No results for input(s): "LIPASE", "AMYLASE" in the last 168 hours. No results for input(s): "AMMONIA" in the last 168 hours. Coagulation Profile: No results for input(s): "INR", "PROTIME" in the last 168 hours. Cardiac Enzymes: No results for input(s): "CKTOTAL", "CKMB", "CKMBINDEX", "TROPONINI" in the last 168 hours. BNP (last 3 results) No results for input(s): "PROBNP" in the last 8760 hours. HbA1C: No results for input(s): "HGBA1C" in the last 72 hours. CBG: No results for input(s): "GLUCAP" in the last 168 hours. Lipid Profile: No results for input(s): "CHOL", "HDL", "LDLCALC", "TRIG", "CHOLHDL", "LDLDIRECT" in the last 72 hours. Thyroid Function Tests: No results for input(s): "TSH", "T4TOTAL", "FREET4", "T3FREE", "THYROIDAB" in the last 72 hours. Anemia Panel: No results for input(s): "VITAMINB12",  "FOLATE", "FERRITIN", "TIBC", "IRON", "RETICCTPCT" in the last 72 hours. Sepsis Labs: No results for input(s): "PROCALCITON", "LATICACIDVEN" in the last 168 hours.  No results found for this or any previous visit (from the past 240 hour(s)).       Radiology Studies: CT ABDOMEN PELVIS WO CONTRAST  Addendum Date: 10/19/2021   ADDENDUM REPORT: 10/19/2021 12:23 ADDENDUM: These results were called by telephone at the time of interpretation on 10/19/2021 at 12:23 pm to provider [facility] , who verbally acknowledged these results. Electronically Signed   By: Zetta Bills M.D.   On: 10/19/2021 12:23   Result Date: 10/19/2021 CLINICAL DATA:  Urothelial carcinoma post LEFT nephrectomy. Post recent TURBT. * Tracking Code: BO * EXAM: CT ABDOMEN AND PELVIS WITHOUT CONTRAST TECHNIQUE: Multidetector CT imaging of the abdomen and pelvis was performed following the standard protocol without IV contrast. RADIATION DOSE REDUCTION: This exam was performed according to the departmental dose-optimization program which includes automated exposure control, adjustment of the mA and/or kV according to patient size and/or use of iterative reconstruction technique. COMPARISON:  January 25, 2021. FINDINGS: Lower chest: Trace LEFT and small RIGHT-sided pleural fluid. Basilar scarring in the lingula and at the LEFT lung base. Cardiac pacer leads in the RIGHT heart. Heart is incompletely imaged. No  pericardial effusion. No chest wall abnormality. Hepatobiliary: Lobulated area in the medial segment of the LEFT hepatic lobe (image 20/2) slightly more dense than surrounding liver 3.1 x 2.2 cm, unchanged dating back to November of 2012 and as far back as May of 2015, unchanged in size compared to the May of 2015 study but with increased density, shown to represent a cyst at the time of the 2015 evaluation. No pericholecystic stranding. No biliary duct dilation. Pancreas: Pancreas with normal contours, no signs of inflammation  or peripancreatic fluid. Spleen: Normal size and contour, spleen has fall in into the LEFT nephrectomy bed. Adrenals/Urinary Tract: RIGHT adrenal gland is normal. Status post LEFT nephrectomy. LEFT adrenal gland with small LEFT adrenal nodule which is unchanged over a series of prior studies measuring 10 Hounsfield units. Renal cortical scarring on the RIGHT which is moderate. Mildly patulous appearance of the RIGHT ureter without hydronephrosis and with partial reconstitution of the proximal loop of the RIGHT nephroureteral stent and full reconstitution of the distal loop. Proximal loop in anterior interpolar calices and distal loops well reconstituted in the urinary bladder. No signs of perivesical stranding. Small amount of gas in the urinary bladder. Stomach/Bowel: No acute gastrointestinal findings. Herniation of the mid transverse colon into the wide mouth ventral hernia and rectus diastasis in the mid abdomen (image 48/2) 6 cm defect in the abdominal wall. No acute gastric or small bowel process. Vascular/Lymphatic: 3 cm x 3 cm infrarenal abdominal aortic aneurysm in the setting of moderate to marked calcified aortic atherosclerosis. Vascular structures not well assessed due to lack of intravenous contrast. No adenopathy in the retroperitoneum. No adenopathy in the pelvis. Reproductive: Unremarkable by CT. Other: Mild stranding in the presacral space and along the RIGHT pelvic sidewall and RIGHT retroperitoneum. Mild RIGHT perinephric stranding. No pneumoperitoneum. No ascites. Musculoskeletal: Compression fracture at L4. This is new compared to imaging from January 25, 2021 and shows 60-70% loss of height. Mild retropulsion of posterior cortical elements along the superior endplate. Fracture involves superior endplate of L4. Alignment is unchanged and there are signs of cement augmentation at L2. Segmentation referenced above assumes 5 lumbar type vertebral bodies with L2 as the level previously treated with  cement augmentation with transitional vertebral anatomy at S1. IMPRESSION: 1. Status post LEFT nephrectomy. 2. Post nephroureteral stenting with stranding in the retroperitoneum and pelvis likely related to recent TURBT and ureteral manipulation. 3. Small amount of gas in the urinary bladder related to above procedure. 4. Compression fracture at "L4" is new compared to imaging from January 25, 2021 and shows 60-70% loss of height. Mild retropulsion of posterior cortical elements along the superior endplate. Segmentation assumes S1 is a transitional vertebra as outlined on previous CT of the lumbar spine from January 2023. 5. Trace LEFT and small RIGHT-sided pleural fluid. 6. Herniation of the mid transverse colon into the wide mouth ventral hernia and rectus diastasis in the mid abdomen. 6 cm defect in the abdominal wall. 7. 3 cm x 3 cm infrarenal abdominal aortic aneurysm in the setting of moderate to marked calcified aortic atherosclerosis. Recommend follow-up every 3 years. 8. Stable hepatic lesion in the medial segment of the LEFT hepatic lobe in terms of size dating back to 2015 likely reflecting a hemorrhagic cyst, internal hemorrhage or complexity developing over time. Aortic Atherosclerosis (ICD10-I70.0). These results will be called to the ordering clinician or representative by the Radiologist Assistant, and communication documented in the PACS or [employer]. Electronically Signed: By: Zetta Bills  M.D. On: 10/19/2021 11:43   DG Lumbar Spine Complete  Result Date: 10/18/2021 CLINICAL DATA:  Low back pain. History of lumbar fracture with vertebroplasty EXAM: LUMBAR SPINE - COMPLETE 4+ VIEW COMPARISON:  CT lumbar spine 01/18/2021 FINDINGS: Moderate to severe compression fracture of L1 with kyphoplasty cement in good position. Mild to moderate compression fracture of L3 is new since the prior CT and may be acute or subacute. Mild anterolisthesis at L4-5 with disc degeneration and spurring. Right  ureteral stent.  No ureteral calculus identified IMPRESSION: 1. Mild to moderate compression fracture of L3, new since the prior CT and may be acute or subacute. 2. Chronic compression fracture L1 with kyphoplasty cement. Electronically Signed   By: Franchot Gallo M.D.   On: 10/18/2021 17:38   DG Thoracic Spine 2 View  Result Date: 10/18/2021 CLINICAL DATA:  Back pain EXAM: THORACIC SPINE 2 VIEWS COMPARISON:  Chest CT 03/18/2019 FINDINGS: Thoracic alignment within normal limits. Minimal chronic wedging at a proximate T8 level. Remaining vertebra demonstrate normal stature. Degenerative osteophytes. Post augmentation changes at L2. IMPRESSION: Degenerative changes.  No acute osseous abnormality Electronically Signed   By: Donavan Foil M.D.   On: 10/18/2021 17:37        Scheduled Meds:  amLODipine  5 mg Oral Daily   atorvastatin  40 mg Oral Daily   fluticasone furoate-vilanterol  1 puff Inhalation Daily   And   umeclidinium bromide  1 puff Inhalation Daily   heparin  5,000 Units Subcutaneous Q8H   Continuous Infusions:  cefTRIAXone (ROCEPHIN)  IV 1 g (10/18/21 2315)     LOS: 0 days      Sidney Ace, MD [facility]   If 7PM-7AM, please contact night-coverage  10/19/2021, 1:02 PM

## 2021-10-19 NOTE — TOC Progression Note (Signed)
Transition of Care [facility]) - Progression Note    Patient Details  Name: Jacob [name] MRN: [id number] Date of Birth: 18-Oct-1928  [facility]) CM/SW [place], RN Phone Number: 10/19/2021, 4:24 PM  Clinical Narrative:    The patient is planning to have [facility], TO to follow and assist with DC planning, he comes from home         Expected Discharge Plan and Services                                                 Social Determinants of Health (SDOH) Interventions    Readmission Risk Interventions     No data to display

## 2021-10-19 NOTE — Plan of Care (Signed)

## 2021-10-20 DIAGNOSIS — R911 Solitary pulmonary nodule: Secondary | ICD-10-CM | POA: Diagnosis present

## 2021-10-20 DIAGNOSIS — I441 Atrioventricular block, second degree: Secondary | ICD-10-CM | POA: Diagnosis present

## 2021-10-20 DIAGNOSIS — I4891 Unspecified atrial fibrillation: Secondary | ICD-10-CM | POA: Diagnosis present

## 2021-10-20 DIAGNOSIS — S32040A Wedge compression fracture of fourth lumbar vertebra, initial encounter for closed fracture: Secondary | ICD-10-CM | POA: Diagnosis not present

## 2021-10-20 DIAGNOSIS — M4856XA Collapsed vertebra, not elsewhere classified, lumbar region, initial encounter for fracture: Secondary | ICD-10-CM | POA: Diagnosis present

## 2021-10-20 DIAGNOSIS — I2510 Atherosclerotic heart disease of native coronary artery without angina pectoris: Secondary | ICD-10-CM | POA: Diagnosis present

## 2021-10-20 DIAGNOSIS — I130 Hypertensive heart and chronic kidney disease with heart failure and stage 1 through stage 4 chronic kidney disease, or unspecified chronic kidney disease: Secondary | ICD-10-CM | POA: Diagnosis present

## 2021-10-20 DIAGNOSIS — K5900 Constipation, unspecified: Secondary | ICD-10-CM | POA: Diagnosis not present

## 2021-10-20 DIAGNOSIS — N179 Acute kidney failure, unspecified: Secondary | ICD-10-CM | POA: Diagnosis not present

## 2021-10-20 DIAGNOSIS — E785 Hyperlipidemia, unspecified: Secondary | ICD-10-CM | POA: Diagnosis present

## 2021-10-20 DIAGNOSIS — E1122 Type 2 diabetes mellitus with diabetic chronic kidney disease: Secondary | ICD-10-CM | POA: Diagnosis present

## 2021-10-20 DIAGNOSIS — K769 Liver disease, unspecified: Secondary | ICD-10-CM | POA: Diagnosis present

## 2021-10-20 DIAGNOSIS — D631 Anemia in chronic kidney disease: Secondary | ICD-10-CM | POA: Diagnosis present

## 2021-10-20 DIAGNOSIS — E875 Hyperkalemia: Secondary | ICD-10-CM | POA: Diagnosis present

## 2021-10-20 DIAGNOSIS — R52 Pain, unspecified: Secondary | ICD-10-CM | POA: Diagnosis not present

## 2021-10-20 DIAGNOSIS — K219 Gastro-esophageal reflux disease without esophagitis: Secondary | ICD-10-CM | POA: Diagnosis present

## 2021-10-20 DIAGNOSIS — I7143 Infrarenal abdominal aortic aneurysm, without rupture: Secondary | ICD-10-CM | POA: Diagnosis present

## 2021-10-20 DIAGNOSIS — J449 Chronic obstructive pulmonary disease, unspecified: Secondary | ICD-10-CM | POA: Diagnosis present

## 2021-10-20 DIAGNOSIS — I700 Atherosclerosis of aorta: Secondary | ICD-10-CM | POA: Diagnosis present

## 2021-10-20 DIAGNOSIS — M5416 Radiculopathy, lumbar region: Secondary | ICD-10-CM | POA: Diagnosis present

## 2021-10-20 DIAGNOSIS — N184 Chronic kidney disease, stage 4 (severe): Secondary | ICD-10-CM | POA: Diagnosis present

## 2021-10-20 DIAGNOSIS — C61 Malignant neoplasm of prostate: Secondary | ICD-10-CM | POA: Diagnosis present

## 2021-10-20 DIAGNOSIS — I5032 Chronic diastolic (congestive) heart failure: Secondary | ICD-10-CM | POA: Diagnosis present

## 2021-10-20 DIAGNOSIS — C679 Malignant neoplasm of bladder, unspecified: Secondary | ICD-10-CM | POA: Diagnosis present

## 2021-10-20 DIAGNOSIS — N400 Enlarged prostate without lower urinary tract symptoms: Secondary | ICD-10-CM | POA: Diagnosis present

## 2021-10-20 LAB — CBC WITH DIFFERENTIAL/PLATELET
Abs Immature Granulocytes: 0.01 10*3/uL (ref 0.00–0.07)
Basophils Absolute: 0.1 10*3/uL (ref 0.0–0.1)
Basophils Relative: 1 %
Eosinophils Absolute: 0.1 10*3/uL (ref 0.0–0.5)
Eosinophils Relative: 1 %
HCT: 28.8 % — ABNORMAL LOW (ref 39.0–52.0)
Hemoglobin: 8.9 g/dL — ABNORMAL LOW (ref 13.0–17.0)
Immature Granulocytes: 0 %
Lymphocytes Relative: 17 %
Lymphs Abs: 1 10*3/uL (ref 0.7–4.0)
MCH: 30.4 pg (ref 26.0–34.0)
MCHC: 30.9 g/dL (ref 30.0–36.0)
MCV: 98.3 fL (ref 80.0–100.0)
Monocytes Absolute: 0.2 10*3/uL (ref 0.1–1.0)
Monocytes Relative: 3 %
Neutro Abs: 4.7 10*3/uL (ref 1.7–7.7)
Neutrophils Relative %: 78 %
Platelets: 155 10*3/uL (ref 150–400)
RBC: 2.93 MIL/uL — ABNORMAL LOW (ref 4.22–5.81)
RDW: 13.7 % (ref 11.5–15.5)
WBC: 6 10*3/uL (ref 4.0–10.5)
nRBC: 0 % (ref 0.0–0.2)

## 2021-10-20 LAB — RENAL FUNCTION PANEL
Albumin: 2.9 g/dL — ABNORMAL LOW (ref 3.5–5.0)
Anion gap: 8 (ref 5–15)
BUN: 61 mg/dL — ABNORMAL HIGH (ref 8–23)
CO2: 18 mmol/L — ABNORMAL LOW (ref 22–32)
Calcium: 8.6 mg/dL — ABNORMAL LOW (ref 8.9–10.3)
Chloride: 109 mmol/L (ref 98–111)
Creatinine, Ser: 2.84 mg/dL — ABNORMAL HIGH (ref 0.61–1.24)
GFR, Estimated: 20 mL/min — ABNORMAL LOW (ref >60)
Glucose, Bld: 97 mg/dL (ref 70–99)
Phosphorus: 4.2 mg/dL (ref 2.5–4.6)
Potassium: 5.4 mmol/L — ABNORMAL HIGH (ref 3.5–5.1)
Sodium: 135 mmol/L (ref 135–145)

## 2021-10-20 MED ORDER — SODIUM CHLORIDE 0.9 % IV SOLN: INTRAVENOUS | Status: AC

## 2021-10-20 MED ORDER — HYDROMORPHONE HCL 1 MG/ML IJ SOLN: 0.5000 mg | INTRAMUSCULAR | Status: DC | PRN

## 2021-10-20 MED ORDER — OXYCODONE HCL 5 MG PO TABS
5.0000 mg | ORAL_TABLET | ORAL | Status: DC | PRN
  Administered 2021-10-20 – 2021-10-21 (×4): 10 mg via ORAL
  Administered 2021-10-22 – 2021-10-23 (×3): 5 mg via ORAL
  Administered 2021-10-25 – 2021-10-26 (×4): 10 mg via ORAL
  Administered 2021-10-27: 5 mg via ORAL
  Filled 2021-10-20: qty 1
  Filled 2021-10-20 (×3): qty 2
  Filled 2021-10-20: qty 1
  Filled 2021-10-20 (×5): qty 2
  Filled 2021-10-20: qty 1
  Filled 2021-10-20: qty 2

## 2021-10-20 MED ORDER — GABAPENTIN 600 MG PO TABS
300.0000 mg | ORAL_TABLET | Freq: Two times a day (BID) | ORAL | Status: DC
  Administered 2021-10-20 – 2021-10-28 (×16): 300 mg via ORAL
  Filled 2021-10-20 (×16): qty 1

## 2021-10-20 NOTE — Progress Notes (Signed)
Orthopedic Tech Progress Note Patient Details:  Jacob [name] 1928-10-29 [id number]  LSO Quick Draw brace called into [facility] at 1023.  Patient ID: Jacob [name], male   DOB: February 04, 1928, 88 y.o.   MRN: [id number]  [name] 10/20/2021, 10:23 AM

## 2021-10-20 NOTE — Evaluation (Signed)
Occupational Therapy Evaluation Patient Details Name: Jacob [name] MRN: [id number] DOB: 03-12-1928 Today's Date: 10/20/2021   History of Present Illness Pt is a 88 yo male that presented to ED for low back pain. noted for L4 compression fracture. Past medical history significant for GERD, diverticulitis, HTN, HDL, renal cancer, and prostate cancer, previous lumbar fractures, COPD, LBBB, pacemaker.   Clinical Impression   Jacob [name] presents with generalized weakness, limited endurance, greatly reduced mobility, and severe pain. Pt reports that he lives alone, is active and out of the house -- meeting friends for meals -- almost every day. He drives, does his own grocery shopping, is Mod I in BADL/IADL, ambulates with a quad cane, has a son and step-daughter who stop by his home 1-2 days/week. During today's evaluation, pt reports 9/10 pain, is alert and fully oriented, requires Max A to come to EOB sitting, declines to attempt standing, 2/2 pain. Pt requests additional pain meds, pt also reports he has not had a BM in 5 days; RN notified. Given that at present pt is significantly off from his PLOF and requires Max A for OOB fxl mobility tasks, recommend DC to SNF.   Recommendations for follow up therapy are one component of a multi-disciplinary discharge planning process, led by the attending physician.  Recommendations may be updated based on patient status, additional functional criteria and insurance authorization.   Follow Up Recommendations  Skilled nursing-short term rehab (<3 hours/day)    Assistance Recommended at Discharge Frequent or constant Supervision/Assistance  Patient can return home with the following A lot of help with walking and/or transfers;A lot of help with bathing/dressing/bathroom;Assistance with cooking/housework;Help with stairs or ramp for entrance    Functional Status Assessment  Patient has had a recent decline in their functional status and demonstrates the  ability to make significant improvements in function in a reasonable and predictable amount of time.  Equipment Recommendations  None recommended by OT    Recommendations for Other Services       Precautions / Restrictions Precautions Precautions: Fall Restrictions Weight Bearing Restrictions: No      Mobility Bed Mobility Overal bed mobility: Needs Assistance Bed Mobility: Supine to Sit, Sit to Supine     Supine to sit: Max assist Sit to supine: Max assist   General bed mobility comments: Max A 2/2 pain    Transfers                   General transfer comment: pt declined at this time due to pain      Balance Overall balance assessment: Needs assistance Sitting-balance support: Feet supported, Bilateral upper extremity supported Sitting balance-Leahy Scale: Fair         Standing balance comment: not attempted                           ADL either performed or assessed with clinical judgement   ADL                                               Vision         Perception     Praxis      Pertinent Vitals/Pain Pain Assessment Pain Assessment: 0-10 Pain Score: 9  Pain Location: lower back Pain Descriptors / Indicators: Aching, Constant, Guarding, Grimacing, Shooting, Sharp Pain  Intervention(s): Limited activity within patient's tolerance, Patient requesting pain meds-RN notified, Repositioned, Utilized relaxation techniques     Hand Dominance     Extremity/Trunk Assessment Upper Extremity Assessment Upper Extremity Assessment: Generalized weakness   Lower Extremity Assessment Lower Extremity Assessment: Generalized weakness       Communication Communication Communication: No difficulties   Cognition Arousal/Alertness: Awake/alert Behavior During Therapy: WFL for tasks assessed/performed Overall Cognitive Status: Within Functional Limits for tasks assessed                                        General Comments       Exercises Other Exercises Other Exercises: Educ re: importance of OOB mobility, role of OT, DC recs   Shoulder Instructions      Home Living Family/patient expects to be discharged to:: Private residence Living Arrangements: Alone Available Help at Discharge: Friend(s);Available PRN/intermittently Type of Home: House Home Access: Stairs to enter [employer] of Steps: 2 Entrance Stairs-Rails: Right Home Layout: One level     Bathroom Shower/Tub: Teacher, early years/pre: Handicapped height     Home Equipment: Grab bars - tub/shower;Cane - quad;Tub bench;Toilet riser;Rollator (4 wheels);Hand held shower head;Rolling Walker (2 wheels)          Prior Functioning/Environment Prior Level of Function : Independent/Modified Independent             Mobility Comments: At baseline, pt is independent with quad cane for functional mobility. ADLs Comments: independent in ADLs. drives, eats out regulalry, does his own grocery shopping        OT Problem List: Decreased activity tolerance;Decreased range of motion;Decreased strength;Impaired balance (sitting and/or standing);Pain      OT Treatment/Interventions: Self-care/ADL training;Therapeutic exercise;Patient/family education;Balance training;Energy conservation;Therapeutic activities;DME and/or AE instruction    OT Goals(Current goals can be found in the care plan section) Acute Rehab OT Goals Patient Stated Goal: to get back out with his friends OT Goal Formulation: With patient Time For Goal Achievement: 11/03/21 Potential to Achieve Goals: Good ADL Goals Pt Will Transfer to Toilet: with modified independence;ambulating;grab bars;stand pivot transfer Pt Will Perform Tub/Shower Transfer: with supervision;shower seat;ambulating;Stand pivot transfer Pt/caregiver will Perform Home Exercise Program: Increased ROM;Increased strength (for flexibility, strength, and pain mgmt)   OT Frequency: Min 2X/week    Co-evaluation              AM-PAC OT "6 Clicks" Daily Activity     Outcome Measure Help from another person eating meals?: None Help from another person taking care of personal grooming?: A Little Help from another person toileting, which includes using toliet, bedpan, or urinal?: A Lot Help from another person bathing (including washing, rinsing, drying)?: A Lot Help from another person to put on and taking off regular upper body clothing?: A Lot Help from another person to put on and taking off regular lower body clothing?: A Lot 6 Click Score: 15   End of Session Nurse Communication: Patient requests pain meds  Activity Tolerance: Patient limited by pain Patient left: in bed;with call bell/phone within reach;with bed alarm set  OT Visit Diagnosis: Unsteadiness on feet (R26.81);Muscle weakness (generalized) (M62.81);Pain                Time: 1520-1547 OT Time Calculation (min): 27 min Charges:  OT General Charges $OT Visit: 1 Visit OT Evaluation $OT Eval Moderate Complexity: 1 Mod OT Treatments $Self Care/Home Management :  23-37 mins Josiah Lobo, PhD, MS, OTR/L 10/20/21, 3:59 PM

## 2021-10-20 NOTE — Progress Notes (Signed)
[facility]  ROUNDING NOTE   Subjective:   Jacob [name] is a 88 y.o. male with past medical history including past medical history includes atrial fibrillation, GERD, hypertension, BPH, bladder tumor status post TURPT, urethral tumor biopsy, urocystoscopy and  chronic kidney disease stage IV.  Patient presents to the emergency department with complaints of back pain and has been admitted under observation for Hyperkalemia [E87.5] Acute kidney insufficiency [N28.9] Inadequate pain control [R52] Severe back pain [M54.9] Closed compression fracture of L3 lumbar vertebra, initial encounter ([place]) [S32.030A] Constipation, unspecified constipation type [K59.00] Lumbar radiculopathy [M54.16]  Patient is known to our practice and is followed outpatient by Dr. Candiss Norse.  Last seen in office in July of this year with worsening renal function noted.  Patient does follow with outpatient urology as well.  Patient seen and evaluated at bedside.  Brother at bedside.  Patient states he has chronic back pain and his oral medications were not managing his pain recently.  Denies any recent activities to aggravate pain.  Patient states he recently underwent a bladder procedure on Monday, causing current bloody appearance of urine.  Denies pain or discomfort with urination.  Appetite currently intact, denies nausea or vomiting.  Denies shortness of breath, remains on room air at baseline.  Patient placed on 2 L nasal cannula for comfort.  Labs on ED arrival significant for glucose 107, BUN 30, creatinine 2.12 with GFR 29, hemoglobin 10.0.  Lumbar spine x-ray shows mild to moderate compression fracture of L3, new since previous CT, and chronic compression fracture L1 with kyphoplasty cement.  Thoracic spine x-ray shows degenerative changes only.  We have been consulted to evaluate acute kidney injury. Patient was seen today on first floor Patient main concern continues to be back pain I then discussed  with the patient about his kidney related issues    Objective:  Vital signs in last 24 hours:  Temp:  [98.2 F (36.8 C)-99.4 F (37.4 C)] 98.2 F (36.8 C) (10/19 1544) Pulse Rate:  [60-75] 62 (10/19 1544) Resp:  [16-20] 16 (10/19 1544) BP: (117-130)/(41-52) 117/41 (10/19 1544) SpO2:  [96 %-98 %] 96 % (10/19 1544)  Weight change:  Filed Weights   10/18/21 1647  Weight: 77.1 kg    Intake/Output: I/O last 3 completed shifts: In: 1182.8 [P.O.:240; I.V.:842.8; IV Piggyback:100] Out: 2850 [Urine:2850]   Intake/Output this shift:  Total I/O In: -  Out: 600 [Urine:600]  Physical Exam: General: NAD, resting comfortably  Head: Normocephalic, atraumatic. Moist oral mucosal membranes  Eyes: Anicteric  Lungs:  Clear to auscultation, normal effort, [place] O2  Heart: Regular rate and rhythm  Abdomen:  Soft, nontender  Extremities: No peripheral edema.  Neurologic: Nonfocal, moving all four extremities  Skin: No lesions  Access: None    Basic Metabolic Panel: Recent Labs  Lab 10/18/21 1951 10/18/21 2302 10/20/21 0815  NA 131* 135 135  K 5.4* 5.4* 5.4*  CL 101 108 109  CO2 20* 21* 18*  GLUCOSE 110* 107* 97  BUN 59* 63* 61*  CREATININE 3.59* 3.66* 2.84*  CALCIUM 8.8* 9.1 8.6*  MG  --  2.4  --   PHOS  --  6.0* 4.2    Liver Function Tests: Recent Labs  Lab 10/20/21 0815  ALBUMIN 2.9*   No results for input(s): "LIPASE", "AMYLASE" in the last 168 hours. No results for input(s): "AMMONIA" in the last 168 hours.  CBC: Recent Labs  Lab 10/18/21 1951 10/19/21 0026 10/20/21 0812  WBC 8.3 7.1  6.0  NEUTROABS  --   --  4.7  HGB 9.6* 8.8* 8.9*  HCT 30.6* 28.0* 28.8*  MCV 96.8 95.6 98.3  PLT 155 151 155    Cardiac Enzymes: No results for input(s): "CKTOTAL", "CKMB", "CKMBINDEX", "TROPONINI" in the last 168 hours.  BNP: Invalid input(s): "POCBNP"  CBG: No results for input(s): "GLUCAP" in the last 168 hours.  Microbiology: Results for orders placed or  performed in visit on 10/05/21  Microscopic Examination     Status: None   Collection Time: 10/05/21  2:07 PM   Urine  Result Value Ref Range Status   WBC, UA 0-5 0 - 5 /hpf Final   RBC, Urine 0-2 0 - 2 /hpf Final   Epithelial Cells (non renal) 0-10 0 - 10 /hpf Final   Bacteria, UA None seen None seen/Few Final  CULTURE, URINE COMPREHENSIVE     Status: Abnormal   Collection Time: 10/05/21  3:07 PM   Specimen: Urine   UR  Result Value Ref Range Status   Urine Culture, Comprehensive Final report (A)  Final   Organism ID, Bacteria Proteus mirabilis (A)  Final    Comment: Cefazolin <=4 ug/mL Cefazolin with an MIC <=16 predicts susceptibility to the oral agents cefaclor, cefdinir, cefpodoxime, cefprozil, cefuroxime, cephalexin, and loracarbef when used for therapy of uncomplicated urinary tract infections due to E. coli, Klebsiella pneumoniae, and Proteus mirabilis. 10,000-25,000 colony forming units per mL    ANTIMICROBIAL SUSCEPTIBILITY Comment  Final    Comment:       ** S = Susceptible; I = Intermediate; R = Resistant **                    P = Positive; N = Negative             MICS are expressed in micrograms per mL    Antibiotic                 RSLT#1    RSLT#2    RSLT#3    RSLT#4 Amoxicillin/Clavulanic Acid    S Ampicillin                     S Cefepime                       S Ceftriaxone                    S Cefuroxime                     S Ciprofloxacin                  S Ertapenem                      S Gentamicin                     S Levofloxacin                   S Meropenem                      S Nitrofurantoin                 R Piperacillin/Tazobactam        S Tetracycline  R Tobramycin                     S Trimethoprim/Sulfa             S     Coagulation Studies: No results for input(s): "LABPROT", "INR" in the last 72 hours.  Urinalysis: Recent Labs    10/18/21 2303  COLORURINE AMBER*  LABSPEC 1.013  PHURINE 5.0  GLUCOSEU NEGATIVE   HGBUR MODERATE*  BILIRUBINUR NEGATIVE  KETONESUR NEGATIVE  PROTEINUR 100*  NITRITE NEGATIVE  LEUKOCYTESUR SMALL*      Imaging: CT ABDOMEN PELVIS WO CONTRAST  Addendum Date: 10/19/2021   ADDENDUM REPORT: 10/19/2021 12:23 ADDENDUM: These results were called by telephone at the time of interpretation on 10/19/2021 at 12:23 pm to provider [facility] , who verbally acknowledged these results. Electronically Signed   By: Zetta Bills M.D.   On: 10/19/2021 12:23   Result Date: 10/19/2021 CLINICAL DATA:  Urothelial carcinoma post LEFT nephrectomy. Post recent TURBT. * Tracking Code: BO * EXAM: CT ABDOMEN AND PELVIS WITHOUT CONTRAST TECHNIQUE: Multidetector CT imaging of the abdomen and pelvis was performed following the standard protocol without IV contrast. RADIATION DOSE REDUCTION: This exam was performed according to the departmental dose-optimization program which includes automated exposure control, adjustment of the mA and/or kV according to patient size and/or use of iterative reconstruction technique. COMPARISON:  January 25, 2021. FINDINGS: Lower chest: Trace LEFT and small RIGHT-sided pleural fluid. Basilar scarring in the lingula and at the LEFT lung base. Cardiac pacer leads in the RIGHT heart. Heart is incompletely imaged. No pericardial effusion. No chest wall abnormality. Hepatobiliary: Lobulated area in the medial segment of the LEFT hepatic lobe (image 20/2) slightly more dense than surrounding liver 3.1 x 2.2 cm, unchanged dating back to November of 2012 and as far back as May of 2015, unchanged in size compared to the May of 2015 study but with increased density, shown to represent a cyst at the time of the 2015 evaluation. No pericholecystic stranding. No biliary duct dilation. Pancreas: Pancreas with normal contours, no signs of inflammation or peripancreatic fluid. Spleen: Normal size and contour, spleen has fall in into the LEFT nephrectomy bed. Adrenals/Urinary Tract: RIGHT  adrenal gland is normal. Status post LEFT nephrectomy. LEFT adrenal gland with small LEFT adrenal nodule which is unchanged over a series of prior studies measuring 10 Hounsfield units. Renal cortical scarring on the RIGHT which is moderate. Mildly patulous appearance of the RIGHT ureter without hydronephrosis and with partial reconstitution of the proximal loop of the RIGHT nephroureteral stent and full reconstitution of the distal loop. Proximal loop in anterior interpolar calices and distal loops well reconstituted in the urinary bladder. No signs of perivesical stranding. Small amount of gas in the urinary bladder. Stomach/Bowel: No acute gastrointestinal findings. Herniation of the mid transverse colon into the wide mouth ventral hernia and rectus diastasis in the mid abdomen (image 48/2) 6 cm defect in the abdominal wall. No acute gastric or small bowel process. Vascular/Lymphatic: 3 cm x 3 cm infrarenal abdominal aortic aneurysm in the setting of moderate to marked calcified aortic atherosclerosis. Vascular structures not well assessed due to lack of intravenous contrast. No adenopathy in the retroperitoneum. No adenopathy in the pelvis. Reproductive: Unremarkable by CT. Other: Mild stranding in the presacral space and along the RIGHT pelvic sidewall and RIGHT retroperitoneum. Mild RIGHT perinephric stranding. No pneumoperitoneum. No ascites. Musculoskeletal: Compression fracture at L4. This is new compared to imaging from January 25, 2021 and shows 60-70% loss of height. Mild retropulsion of posterior cortical elements along the superior endplate. Fracture involves superior endplate of L4. Alignment is unchanged and there are signs of cement augmentation at L2. Segmentation referenced above assumes 5 lumbar type vertebral bodies with L2 as the level previously treated with cement augmentation with transitional vertebral anatomy at S1. IMPRESSION: 1. Status post LEFT nephrectomy. 2. Post nephroureteral  stenting with stranding in the retroperitoneum and pelvis likely related to recent TURBT and ureteral manipulation. 3. Small amount of gas in the urinary bladder related to above procedure. 4. Compression fracture at "L4" is new compared to imaging from January 25, 2021 and shows 60-70% loss of height. Mild retropulsion of posterior cortical elements along the superior endplate. Segmentation assumes S1 is a transitional vertebra as outlined on previous CT of the lumbar spine from January 2023. 5. Trace LEFT and small RIGHT-sided pleural fluid. 6. Herniation of the mid transverse colon into the wide mouth ventral hernia and rectus diastasis in the mid abdomen. 6 cm defect in the abdominal wall. 7. 3 cm x 3 cm infrarenal abdominal aortic aneurysm in the setting of moderate to marked calcified aortic atherosclerosis. Recommend follow-up every 3 years. 8. Stable hepatic lesion in the medial segment of the LEFT hepatic lobe in terms of size dating back to 2015 likely reflecting a hemorrhagic cyst, internal hemorrhage or complexity developing over time. Aortic Atherosclerosis (ICD10-I70.0). These results will be called to the ordering clinician or representative by the Radiologist Assistant, and communication documented in the PACS or [employer]. Electronically Signed: By: Zetta Bills M.D. On: 10/19/2021 11:43     Medications:    sodium chloride 75 mL/hr at 10/20/21 1340   cefTRIAXone (ROCEPHIN)  IV 1 g (10/19/21 2329)    amLODipine  5 mg Oral Daily   atorvastatin  40 mg Oral Daily   fluticasone furoate-vilanterol  1 puff Inhalation Daily   And   umeclidinium bromide  1 puff Inhalation Daily   gabapentin  300 mg Oral BID   heparin  5,000 Units Subcutaneous Q8H   polyethylene glycol  17 g Oral Daily   senna-docusate  1 tablet Oral BID   acetaminophen **OR** acetaminophen, albuterol, bisacodyl, diphenhydrAMINE, HYDROmorphone (DILAUDID) injection, ondansetron **OR** ondansetron (ZOFRAN) IV,  oxybutynin, oxyCODONE, pantoprazole  Assessment/ Plan:  Jacob [name] is a 88 y.o.  male with past medical history including past medical history includes atrial fibrillation, GERD, hypertension, BPH, bladder tumor status post TURPT, urethral tumor biopsy, urocystoscopy and chronic kidney disease stage IV.  Patient presents to the emergency department with complaints of back pain and has been admitted under observation for Hyperkalemia [E87.5] Acute kidney insufficiency [N28.9] Inadequate pain control [R52] Severe back pain [M54.9] Closed compression fracture of L3 lumbar vertebra, initial encounter ([place]) [S32.030A] Constipation, unspecified constipation type [K59.00] Lumbar radiculopathy [M54.16]   Acute Kidney Injury with hyperkalemia on chronic kidney disease stage IV with baseline creatinine 1.95 and GFR of 32 on 02/01/21.  Patient is s/p left nephrectomy Acute kidney etiology unclear at this time. Worsening renal function noted outpatient.  Chronic kidney disease is secondary to hypertensive nephropathy. CT Abd/Pelvis negative for obstruction. We agree with holding Bactrim and offering IV fluids for now.  Creatinine elevated thi to 3.66. now at 2.8 Adequate urine output noted,  No acute need for dialysis at this time.  Please continue to avoid nephrotoxic agents and therapies, if possible.   Lab Results  Component Value Date   CREATININE 2.84 (  H) 10/20/2021   CREATININE 3.66 (H) 10/18/2021   CREATININE 3.59 (H) 10/18/2021    Intake/Output Summary (Last 24 hours) at 10/20/2021 1737 Last data filed at 10/20/2021 1601 Gross per 24 hour  Intake --  Output 1600 ml  Net -1600 ml   2. Hypertension with chronic kidney disease. Home regimen includes amlodipine and isosorbide. Only receiving amlodipine at this time. Blood pressure remains stable for this patient.   3. Dysuria likely due to recent [place]. Bactrim stopped in setting of kidney injury, receiving Rocephin. Primary  team to manage.   4. Anemia of chronic kidney disease: Normocytic Lab Results  Component Value Date   HGB 8.9 (L) 10/20/2021    Hemoglobin below desired target, will hold ESA or iron supplementation due to concern for malignancy.   5. Hyperphosphatemia, outpatient phosphorus 3.7 on 01/04/21. Currently 6.0. Will continue to monitor and assess need for binder.    LOS: 0 Jacob [name] s Jacob [name] 10/19/20235:37 PM

## 2021-10-20 NOTE — Progress Notes (Addendum)
0 PROGRESS NOTE    Jacob [name]  TWS:[id number] DOB: 10-Aug-1928 DOA: 10/18/2021 PCP: [name], MD    Brief Narrative:  88 year old male with history of L1 compression fracture, BPH, bladder tumor, status post TURPT, urocystoscopy, ureteral tumor biopsy, atrial fibrillation, hypertension, GERD, history of diverticulosis with diverticulitis, renal cell carcinoma status post left nephroureterectomy in August 2015, hyperlipidemia, Mobitz type II AV block, status postcardiac pacemaker placement in 2015, who presents emergency department for chief concerns of low back pain.  Pursued CT abdomen pelvis given concern for possible lytic lesions of metastatic disease.  Study limited by lack of IV contrast however no metastatic disease noted.  There was L4 compression fracture with 60 to 70% height loss.  Also with AKI on CKD stage IV.  Nephrology consult requested. Interventional radiology has approved patient for kyphoplasty.  Timing of procedure unclear.     Assessment & Plan:   Principal Problem:   Inadequate pain control Active Problems:   Hyperlipidemia   Left bundle branch block   Urothelial carcinoma of kidney (HCC)   HTN (hypertension)   Renal cancer, left (HCC)   Mobitz type 2 second degree AV block   COPD (chronic obstructive pulmonary disease) (HCC)   GERD (gastroesophageal reflux disease)   Stage 3a chronic kidney disease (CKD) (HCC)   Type 2 diabetes mellitus without complication, without long-term current use of insulin (HCC)   Closed compression fracture of L3 lumbar vertebra, initial encounter (HCC)   Hyperkalemia   AKI (acute kidney injury) ([place])   Heart failure with preserved ejection fraction (HCC)   Dysuria   Lumbar radiculopathy  Intractable back pain L4 compression fracture Patient has had poor tolerance of p.o. narcotic regimen.  States it constipates him and does not control his pain.  TLSO brace counseled.  Patient states this is uncomfortable. -IR  approved patient for kyphoplasty -Neurosurgery recommends LSO brace when upright.  Does not recommend surgical intervention at this time Plan: continue multimodal pain regimen and bowel regimen  AKI on CKD stage IV Patient was prescribed Bactrim prior to cystoscopy.  This has been held.  He is established with Dr. Murlean Iba for outpatient nephrology. Procalcitonin 0.24 Plan: Resume IV fluids, normal saline at 75 cc/h x 15 hours Nephrology following, recommendations appreciated  Dysuria Unclear etiology.  Unable to exclude urinary tract infection at this time.  Could be secondary to recent bladder instrumentation Procalcitonin 0.24 Plan: Continue IV Rocephin for now Plan on 5-day stop date  GERD PPI  Hypertension PTA Norvasc  Hyperlipidemia PTA Lipitor   DVT prophylaxis: SQ heparin Code Status: Full Family Communication: Kamali Nephew [phone] on 10/19 Disposition Plan: Status is: Inpatient Remains inpatient appropriate because: Intractable back pain secondary to L4 compression fracture.  Will require kyphoplasty.  Procedure pending.  Intractable pain requiring narcotic administration    Level of care: Med-Surg  Consultants:  IR Nephrology  Procedures:  None none  Antimicrobials: Ceftriaxone   Subjective: Seen and examined.  Ask about timing of kyphoplasty.  Objective: Vitals:   10/19/21 0733 10/19/21 1507 10/19/21 2301 10/20/21 0854  BP: (!) 144/49 (!) 123/47 (!) 123/50 (!) 130/52  Pulse: 77 67 60 75  Resp: 16 16 20 16   Temp: 98.7 F (37.1 C) 99.6 F (37.6 C) 99.4 F (37.4 C) 98.6 F (37 C)  TempSrc:      SpO2: 98% 97% 98% 98%  Weight:        Intake/Output Summary (Last 24 hours) at 10/20/2021 1305 Last data filed at  10/20/2021 0503 Gross per 24 hour  Intake 179.07 ml  Output 1150 ml  Net -970.93 ml   Filed Weights   10/18/21 1647  Weight: 77.1 kg    Examination:  General exam: No distress Respiratory system: Clear to  auscultation. Respiratory effort normal. Cardiovascular system: S1-2, RRR, no murmurs, no pedal edema Gastrointestinal system: Soft, NT/ND, normal bowel sounds Central nervous system: Alert and oriented. No focal neurological deficits. Extremities: Decreased trunk bilateral lower extremities.  Decreased ROM secondary to pain Skin: No rashes, lesions or ulcers Psychiatry: Judgement and insight appear normal. Mood & affect appropriate.     Data Reviewed: I have personally reviewed following labs and imaging studies  CBC: Recent Labs  Lab 10/18/21 1951 10/19/21 0026 10/20/21 0812  WBC 8.3 7.1 6.0  NEUTROABS  --   --  4.7  HGB 9.6* 8.8* 8.9*  HCT 30.6* 28.0* 28.8*  MCV 96.8 95.6 98.3  PLT 155 151 268   Basic Metabolic Panel: Recent Labs  Lab 10/18/21 1951 10/18/21 2302 10/20/21 0815  NA 131* 135 135  K 5.4* 5.4* 5.4*  CL 101 108 109  CO2 20* 21* 18*  GLUCOSE 110* 107* 97  BUN 59* 63* 61*  CREATININE 3.59* 3.66* 2.84*  CALCIUM 8.8* 9.1 8.6*  MG  --  2.4  --   PHOS  --  6.0* 4.2   GFR: Estimated Creatinine Clearance: 16.9 mL/min (A) (by C-G formula based on SCr of 2.84 mg/dL (H)). Liver Function Tests: Recent Labs  Lab 10/20/21 0815  ALBUMIN 2.9*   No results for input(s): "LIPASE", "AMYLASE" in the last 168 hours. No results for input(s): "AMMONIA" in the last 168 hours. Coagulation Profile: No results for input(s): "INR", "PROTIME" in the last 168 hours. Cardiac Enzymes: No results for input(s): "CKTOTAL", "CKMB", "CKMBINDEX", "TROPONINI" in the last 168 hours. BNP (last 3 results) No results for input(s): "PROBNP" in the last 8760 hours. HbA1C: No results for input(s): "HGBA1C" in the last 72 hours. CBG: No results for input(s): "GLUCAP" in the last 168 hours. Lipid Profile: No results for input(s): "CHOL", "HDL", "LDLCALC", "TRIG", "CHOLHDL", "LDLDIRECT" in the last 72 hours. Thyroid Function Tests: No results for input(s): "TSH", "T4TOTAL", "FREET4",  "T3FREE", "THYROIDAB" in the last 72 hours. Anemia Panel: No results for input(s): "VITAMINB12", "FOLATE", "FERRITIN", "TIBC", "IRON", "RETICCTPCT" in the last 72 hours. Sepsis Labs: Recent Labs  Lab 10/19/21 0026  PROCALCITON 0.24    No results found for this or any previous visit (from the past 240 hour(s)).       Radiology Studies: CT ABDOMEN PELVIS WO CONTRAST  Addendum Date: 10/19/2021   ADDENDUM REPORT: 10/19/2021 12:23 ADDENDUM: These results were called by telephone at the time of interpretation on 10/19/2021 at 12:23 pm to provider [employer] , who verbally acknowledged these results. Electronically Signed   By: Zetta Bills M.D.   On: 10/19/2021 12:23   Result Date: 10/19/2021 CLINICAL DATA:  Urothelial carcinoma post LEFT nephrectomy. Post recent TURBT. * Tracking Code: BO * EXAM: CT ABDOMEN AND PELVIS WITHOUT CONTRAST TECHNIQUE: Multidetector CT imaging of the abdomen and pelvis was performed following the standard protocol without IV contrast. RADIATION DOSE REDUCTION: This exam was performed according to the departmental dose-optimization program which includes automated exposure control, adjustment of the mA and/or kV according to patient size and/or use of iterative reconstruction technique. COMPARISON:  January 25, 2021. FINDINGS: Lower chest: Trace LEFT and small RIGHT-sided pleural fluid. Basilar scarring in the lingula and at the LEFT  lung base. Cardiac pacer leads in the RIGHT heart. Heart is incompletely imaged. No pericardial effusion. No chest wall abnormality. Hepatobiliary: Lobulated area in the medial segment of the LEFT hepatic lobe (image 20/2) slightly more dense than surrounding liver 3.1 x 2.2 cm, unchanged dating back to November of 2012 and as far back as May of 2015, unchanged in size compared to the May of 2015 study but with increased density, shown to represent a cyst at the time of the 2015 evaluation. No pericholecystic stranding. No biliary duct  dilation. Pancreas: Pancreas with normal contours, no signs of inflammation or peripancreatic fluid. Spleen: Normal size and contour, spleen has fall in into the LEFT nephrectomy bed. Adrenals/Urinary Tract: RIGHT adrenal gland is normal. Status post LEFT nephrectomy. LEFT adrenal gland with small LEFT adrenal nodule which is unchanged over a series of prior studies measuring 10 Hounsfield units. Renal cortical scarring on the RIGHT which is moderate. Mildly patulous appearance of the RIGHT ureter without hydronephrosis and with partial reconstitution of the proximal loop of the RIGHT nephroureteral stent and full reconstitution of the distal loop. Proximal loop in anterior interpolar calices and distal loops well reconstituted in the urinary bladder. No signs of perivesical stranding. Small amount of gas in the urinary bladder. Stomach/Bowel: No acute gastrointestinal findings. Herniation of the mid transverse colon into the wide mouth ventral hernia and rectus diastasis in the mid abdomen (image 48/2) 6 cm defect in the abdominal wall. No acute gastric or small bowel process. Vascular/Lymphatic: 3 cm x 3 cm infrarenal abdominal aortic aneurysm in the setting of moderate to marked calcified aortic atherosclerosis. Vascular structures not well assessed due to lack of intravenous contrast. No adenopathy in the retroperitoneum. No adenopathy in the pelvis. Reproductive: Unremarkable by CT. Other: Mild stranding in the presacral space and along the RIGHT pelvic sidewall and RIGHT retroperitoneum. Mild RIGHT perinephric stranding. No pneumoperitoneum. No ascites. Musculoskeletal: Compression fracture at L4. This is new compared to imaging from January 25, 2021 and shows 60-70% loss of height. Mild retropulsion of posterior cortical elements along the superior endplate. Fracture involves superior endplate of L4. Alignment is unchanged and there are signs of cement augmentation at L2. Segmentation referenced above assumes  5 lumbar type vertebral bodies with L2 as the level previously treated with cement augmentation with transitional vertebral anatomy at S1. IMPRESSION: 1. Status post LEFT nephrectomy. 2. Post nephroureteral stenting with stranding in the retroperitoneum and pelvis likely related to recent TURBT and ureteral manipulation. 3. Small amount of gas in the urinary bladder related to above procedure. 4. Compression fracture at "L4" is new compared to imaging from January 25, 2021 and shows 60-70% loss of height. Mild retropulsion of posterior cortical elements along the superior endplate. Segmentation assumes S1 is a transitional vertebra as outlined on previous CT of the lumbar spine from January 2023. 5. Trace LEFT and small RIGHT-sided pleural fluid. 6. Herniation of the mid transverse colon into the wide mouth ventral hernia and rectus diastasis in the mid abdomen. 6 cm defect in the abdominal wall. 7. 3 cm x 3 cm infrarenal abdominal aortic aneurysm in the setting of moderate to marked calcified aortic atherosclerosis. Recommend follow-up every 3 years. 8. Stable hepatic lesion in the medial segment of the LEFT hepatic lobe in terms of size dating back to 2015 likely reflecting a hemorrhagic cyst, internal hemorrhage or complexity developing over time. Aortic Atherosclerosis (ICD10-I70.0). These results will be called to the ordering clinician or representative by the Radiologist Assistant, and  communication documented in the PACS or [employer]. Electronically Signed: By: Zetta Bills M.D. On: 10/19/2021 11:43   DG Lumbar Spine Complete  Result Date: 10/18/2021 CLINICAL DATA:  Low back pain. History of lumbar fracture with vertebroplasty EXAM: LUMBAR SPINE - COMPLETE 4+ VIEW COMPARISON:  CT lumbar spine 01/18/2021 FINDINGS: Moderate to severe compression fracture of L1 with kyphoplasty cement in good position. Mild to moderate compression fracture of L3 is new since the prior CT and may be acute or  subacute. Mild anterolisthesis at L4-5 with disc degeneration and spurring. Right ureteral stent.  No ureteral calculus identified IMPRESSION: 1. Mild to moderate compression fracture of L3, new since the prior CT and may be acute or subacute. 2. Chronic compression fracture L1 with kyphoplasty cement. Electronically Signed   By: Franchot Gallo M.D.   On: 10/18/2021 17:38   DG Thoracic Spine 2 View  Result Date: 10/18/2021 CLINICAL DATA:  Back pain EXAM: THORACIC SPINE 2 VIEWS COMPARISON:  Chest CT 03/18/2019 FINDINGS: Thoracic alignment within normal limits. Minimal chronic wedging at a proximate T8 level. Remaining vertebra demonstrate normal stature. Degenerative osteophytes. Post augmentation changes at L2. IMPRESSION: Degenerative changes.  No acute osseous abnormality Electronically Signed   By: Donavan Foil M.D.   On: 10/18/2021 17:37        Scheduled Meds:  amLODipine  5 mg Oral Daily   atorvastatin  40 mg Oral Daily   fluticasone furoate-vilanterol  1 puff Inhalation Daily   And   umeclidinium bromide  1 puff Inhalation Daily   gabapentin  300 mg Oral BID   heparin  5,000 Units Subcutaneous Q8H   polyethylene glycol  17 g Oral Daily   senna-docusate  1 tablet Oral BID   Continuous Infusions:  cefTRIAXone (ROCEPHIN)  IV 1 g (10/19/21 2329)     LOS: 0 days      Sidney Ace, MD [facility]   If 7PM-7AM, please contact night-coverage  10/20/2021, 1:05 PM

## 2021-10-20 NOTE — Plan of Care (Signed)
  Problem: Education: Goal: Knowledge of General Education information will improve Description: Including pain rating scale, medication(s)/side effects and non-pharmacologic comfort measures Outcome: Progressing   Problem: Health Behavior/Discharge Planning: Goal: Ability to manage health-related needs will improve Outcome: Progressing   Problem: Clinical Measurements: Goal: Will remain free from infection Outcome: Progressing Goal: Diagnostic test results will improve Outcome: Progressing Goal: Respiratory complications will improve Outcome: Progressing   Problem: Activity: Goal: Risk for activity intolerance will decrease Outcome: Progressing   Problem: Nutrition: Goal: Adequate nutrition will be maintained Outcome: Progressing   Problem: Coping: Goal: Level of anxiety will decrease Outcome: Progressing   Problem: Pain Managment: Goal: General experience of comfort will improve Outcome: Progressing   Problem: Safety: Goal: Ability to remain free from injury will improve Outcome: Progressing   Problem: Skin Integrity: Goal: Risk for impaired skin integrity will decrease Outcome: Progressing

## 2021-10-20 NOTE — Consult Note (Signed)
Referring Physician:  No referring provider defined for this encounter.  Primary Physician:  [name], MD  History of Present Illness: 10/20/2021 [name] is here today with a chief complaint of low back pain.  This is very severe, was ultimately diagnosed to have a significant L4 compression fracture.  He was also suffering from an acute kidney injury on top of his known chronic kidney disease.  He continues to have severe back pain.  Review of Systems:  A 10 point review of systems is negative, except for the pertinent positives and negatives detailed in the HPI.  Past Medical History: Past Medical History:  Diagnosis Date   Anemia    Arthritis of both hands    BPH (benign prostatic hypertrophy)    COPD (chronic obstructive pulmonary disease) (HCC)    Coronary artery disease    a.) LHC 11/18/2004: 40% pLAD, 50% mLAD, 80% D1, 50% mLCx, 100% mRCA - refer to [facility] for PCI; b.) LHC/PCI 11/21/2004: 100% mRCA (3.5 x 23 mm Cypher DES), 95% dRCA (2.5 x 28 mm Cypher); b.) 05/03/12 Cardiac CT: patent RCA stents, nonobs dzs;  c.) 01/2013 Ex MV: nl EF, no ischemia.   Diastolic dysfunction    a.) TTE 08/29/2013: EF 60-65%, mild LVH, mild BAE, mild MR, mod TR, G1DD; b.) TTE 07/01/2020: EF 50-55%, LAEm triv MR, mild-mod Ao sclerosis with no stenosis, G2DD   GERD (gastroesophageal reflux disease)    Glaucoma of both eyes    H/O hiatal hernia    History of atrial fibrillation 2006   History of diverticulitis    History of kidney stones    History of renal cell carcinoma    a.) s/p LEFT nephroureterectomy   Hyperlipidemia    Hypertension    LBBB (left bundle branch block)    Mobitz type 2 second degree AV block    a.) s/p PPM placement 08/29/2013   Pneumonia    Presence of permanent cardiac pacemaker    Recurrent bladder transitional cell carcinoma ([place])    a.) s/p TURBT and intravesical chemotherapy   Renal insufficiency    a. Creat rose to 2.34 post-op L nephrectomy.    S/P placement of cardiac pacemaker 08/29/2013   a.) Medtroinc device placed for symptomatic bradycardia and mobitz 2 second degree heart block   Symptomatic bradycardia    a.) s/p PPM placement 08/29/2013    Past Surgical History: Past Surgical History:  Procedure Laterality Date   APPENDECTOMY  02/03/1988   BLADDER INSTILLATION N/A 10/17/2021   Procedure: BLADDER INSTILLATION OF GEMCITABINE;  Surgeon: [name], MD;  Location: [facility];  Service: Urology;  Laterality: N/A;   CATARACT EXTRACTION W/ INTRAOCULAR LENS  IMPLANT, BILATERAL  01/02/2009   COLONOSCOPY     CORONARY ANGIOPLASTY WITH STENT PLACEMENT  11/21/2004   Procedure: CORONARY ANGIOPLASTY WITH STENT PLACEMENT (mRCA and dRCA); Location: [facility]; Surgeon: Maryjean Morn, MD   CYSTOSCOPY W/ RETROGRADES Right 01/13/2014   Procedure: CYSTOSCOPY WITH RETROGRADE PYELOGRAM;  Surgeon: [name], MD;  Location: [facility];  Service: Urology;  Laterality: Right;   CYSTOSCOPY W/ URETERAL STENT PLACEMENT Left 08/22/2013   Procedure: CYSTOSCOPY WITH RETROGRADE PYELOGRAM/URETERAL STENT PLACEMENT;  Surgeon: [name], MD;  Location: [facility];  Service: Urology;  Laterality: Left;   CYSTOSCOPY W/ URETERAL STENT PLACEMENT Right 10/17/2021   Procedure: CYSTOSCOPY WITH RETROGRADE PYELOGRAM/URETERAL STENT PLACEMENT;  Surgeon: [name], MD;  Location: [facility];  Service: Urology;  Laterality: Right;   CYSTOSCOPY WITH BIOPSY  04/13/2011   Procedure: CYSTOSCOPY WITH BIOPSY;  Surgeon: [name], MD;  Location: [facility];  Service: Urology;  Laterality: N/A;  Cold Cup Biopsy   CYSTOSCOPY WITH RETROGRADE PYELOGRAM, URETEROSCOPY AND STENT PLACEMENT Left 06/19/2013   Procedure: CYSTOSCOPY WITH LEFT RETROGRADE PYELOGRAM, LEFT URETEROSCOPY, ureteral balloon dilatation, BIOPSY LEFT KIDNEY;  Surgeon: [name], MD;  Location: [facility];  Service: Urology;  Laterality: Left;   INGUINAL HERNIA  REPAIR Left 08/22/2013   Procedure: LAPAROSCOPIC INGUINAL HERNIA;  Surgeon: [name], MD;  Location: [facility];  Service: Urology;  Laterality: Left;   IR KYPHO LUMBAR INC FX REDUCE BONE BX UNI/BIL CANNULATION INC/IMAGING  01/31/2021   LEFT HEART CATH AND CORONARY ANGIOGRAPHY Left 11/18/2004   Procedure: LEFT HEART CATH AND CORONARY ANGIOGRAPHY; Location: [facility]; Surgeon: Neoma Laming, MD   PERMANENT PACEMAKER INSERTION N/A 08/29/2013   Procedure: PERMANENT PACEMAKER INSERTION;  Surgeon: [name], MD;  Location: [facility] C[facility];  Service: Cardiovascular;  Laterality: N/A;  Medtronic dual-chamber   ROBOT ASSITED LAPAROSCOPIC NEPHROURETERECTOMY Left 08/22/2013   Procedure: ROBOT ASSITED LAPAROSCOPIC NEPHROURETERECTOMY;  Surgeon: [name], MD;  Location: [facility];  Service: Urology;  Laterality: Left;   TRANSURETHRAL RESECTION OF BLADDER TUMOR  12/22/2010   TRANSURETHRAL RESECTION OF BLADDER TUMOR  08/04/2011   Procedure: TRANSURETHRAL RESECTION OF BLADDER TUMOR (TURBT);  Surgeon: [name], MD;  Location: [facility];  Service: Urology;  Laterality: N/A;   TRANSURETHRAL RESECTION OF BLADDER TUMOR N/A 01/13/2014   Procedure: TRANSURETHRAL RESECTION OF BLADDER TUMOR (TURBT);  Surgeon: [name], MD;  Location: [facility];  Service: Urology;  Laterality: N/A;   TRANSURETHRAL RESECTION OF BLADDER TUMOR  10/17/2021   Procedure: TRANSURETHRAL RESECTION OF BLADDER TUMOR (TURBT);  Surgeon: [name], MD;  Location: [facility];  Service: Urology;;   URETERAL BIOPSY  10/17/2021   Procedure: URETERAL BIOPSY;  Surgeon: [name], MD;  Location: [facility];  Service: Urology;;   URETEROSCOPY  10/17/2021   Procedure: URETEROSCOPY;  Surgeon: [name], MD;  Location: [facility];  Service: Urology;;    Allergies: Allergies as of 10/18/2021 - Review Complete 10/18/2021  Allergen Reaction Noted   Penicillins Hives 02/13/2013     Medications: Current Meds  Medication Sig   amLODipine (NORVASC) 5 MG tablet Take 1 tablet (5 mg total) by mouth daily.   aspirin 81 MG tablet Take 81 mg by mouth daily.   atorvastatin (LIPITOR) 40 MG tablet Take 1 tablet (40 mg total) by mouth daily. Needs appointment for future refills / 2nd attempt   Budeson-Glycopyrrol-Formoterol (BREZTRI AEROSPHERE) 160-9-4.8 MCG/ACT AERO Inhale into the lungs in the morning and at bedtime.   esomeprazole (NEXIUM) 20 MG capsule Take 20 mg by mouth as needed.   isosorbide mononitrate (IMDUR) 30 MG 24 hr tablet Take 30 mg by mouth at bedtime.   latanoprost (XALATAN) 0.005 % ophthalmic solution Place 1 drop into both eyes at bedtime.    Social History: Social History   Tobacco Use   Smoking status: Former    Packs/day: 1.00    Years: 50.00    Total pack years: 50.00    Types: Cigarettes    Quit date: 11/02/2000    Years since quitting: 20.9   Smokeless tobacco: Never  Vaping Use   Vaping Use: Never used  Substance Use Topics   Alcohol use: No   Drug use: No    Family Medical History: Family History  Problem Relation Age of Onset  Heart attack Mother    Heart attack Father    Prostate cancer Neg Hx    Bladder Cancer Neg Hx    Kidney cancer Neg Hx     Physical Examination: Vitals:   10/19/21 1507 10/19/21 2301  BP: (!) 123/47 (!) 123/50  Pulse: 67 60  Resp: 16 20  Temp: 99.6 F (37.6 C) 99.4 F (37.4 C)  SpO2: 97% 98%    General: Patient is well developed, well nourished, calm, collected, and in no apparent distress. Attention to examination is appropriate.  Neck:   Supple.  Full range of motion.  Respiratory: Patient is breathing without any difficulty on [place].   NEUROLOGICAL:     Awake, alert, oriented to person, place, and time.  Speech is clear and fluent. Fund of knowledge is appropriate.   Cranial Nerves: Pupils equal round and reactive to light.  Facial tone is symmetric.  Facial sensation is symmetric.  Shoulder shrug is symmetric. Tongue protrusion is midline.    Strength: MAEW  Side Iliopsoas Quads Hamstring PF DF EHL  R 5 5 5 5 5 5   L 5 5 5 5 5 5    Reflexes are 1+ and symmetric at the biceps, triceps, brachioradialis, patella and achilles.   Hoffman's is absent.   Bilateral upper and lower extremity sensation is intact to light touch.    No evidence of dysmetria noted.  Gait is untested.     Medical Decision Making  Imaging: CT A/P 10/19/21 Musculoskeletal: Compression fracture at L4. This is new compared to imaging from January 25, 2021 and shows 60-70% loss of height. Mild retropulsion of posterior cortical elements along the superior endplate. Fracture involves superior endplate of L4. Alignment is unchanged and there are signs of cement augmentation at L2. Segmentation referenced above assumes 5 lumbar type vertebral bodies with L2 as the level previously treated with cement augmentation with transitional vertebral anatomy at S1.   IMPRESSION: 1. Status post LEFT nephrectomy. 2. Post nephroureteral stenting with stranding in the retroperitoneum and pelvis likely related to recent TURBT and ureteral manipulation. 3. Small amount of gas in the urinary bladder related to above procedure. 4. Compression fracture at "L4" is new compared to imaging from January 25, 2021 and shows 60-70% loss of height. Mild retropulsion of posterior cortical elements along the superior endplate. Segmentation assumes S1 is a transitional vertebra as outlined on previous CT of the lumbar spine from January 2023. 5. Trace LEFT and small RIGHT-sided pleural fluid. 6. Herniation of the mid transverse colon into the wide mouth ventral hernia and rectus diastasis in the mid abdomen. 6 cm defect in the abdominal wall. 7. 3 cm x 3 cm infrarenal abdominal aortic aneurysm in the setting of moderate to marked calcified aortic atherosclerosis. Recommend follow-up every 3 years. 8. Stable hepatic  lesion in the medial segment of the LEFT hepatic lobe in terms of size dating back to 2015 likely reflecting a hemorrhagic cyst, internal hemorrhage or complexity developing over time.   Aortic Atherosclerosis (ICD10-I70.0).   These results will be called to the ordering clinician or representative by the Radiologist Assistant, and communication documented in the PACS or [employer].   Electronically Signed: By: Zetta Bills M.D. On: 10/19/2021 11:43    I have personally reviewed the images and agree with the above interpretation.  Assessment and Plan: Mr. [place]ac[place] is a pleasant 88 y.o. male with L4 compression fracture (numbering per report).  I would not recommend surgical stabilization.  We discussed kyphoplasty.  He will be evaluated by interventional radiology for this.  I have also ordered an LSO brace for him.  He should wear that when he is upright.  We will see him back in the clinic on an outpatient basis.      Bertil Brickey K. Izora Ribas MD, [facility] Neurosurgery

## 2021-10-20 NOTE — Progress Notes (Signed)
PT Cancellation Note  Patient Details Name: Jacob [name] MRN: [id number] DOB: February 10, 1928   Cancelled Treatment:    Reason Eval/Treat Not Completed: Other (comment). Pt  stated he just got comfortable and declining mobility at this time. PT and pt did discuss plan of care, importance of continued mobility, and potential attempt later this PM. RN reported LSO has been ordered.   Lieutenant Diego PT, DPT 11:03 AM,10/20/21

## 2021-10-20 NOTE — Evaluation (Signed)
Physical Therapy Evaluation Patient Details Name: Jacob [name] MRN: [id number] DOB: 1928-04-05 Today's Date: 10/20/2021  History of Present Illness  Pt is a 88 yo male that presented to ED for low back pain. noted for L4 compression fracture. Past medical history significant for GERD, diverticulitis, HTN, HDL, renal cancer, and prostate cancer, previous lumbar fractures, COPD, LBBB, pacemaker.   Clinical Impression  Patient alert, agreeable to PT with some encouragement, reported minimal pain at rest but feeling "whoozy" from medication. Per pt at baseline he is modI with quad cane, drives, runs errands, eats out for his meals, independent for ADLs.  The patient was instructed in several BLE exercises, able to complete with verbal and tactile cues. Supine to sit with maxAx2 to help minimize pain. He was able to sit ~3 minutes but quickly fatigued and reported significant back pain throughout. Declined OOB mobility as well as donning LSO at this time. Returned to supine maxA and totalAx2 to scoot in bed.  Overall the patient demonstrated deficits (see "PT Problem List") that impede the patient's functional abilities, safety, and mobility and would benefit from skilled PT intervention. Recommendation at this time is SNF due to current level of assistance needed and changes from PLOF.        Recommendations for follow up therapy are one component of a multi-disciplinary discharge planning process, led by the attending physician.  Recommendations may be updated based on patient status, additional functional criteria and insurance authorization.  Follow Up Recommendations Skilled nursing-short term rehab (<3 hours/day) Can patient physically be transported by private vehicle: No    Assistance Recommended at Discharge Frequent or constant Supervision/Assistance  Patient can return home with the following  Two people to help with walking and/or transfers;Two people to help with  bathing/dressing/bathroom;Help with stairs or ramp for entrance;Assist for transportation;Direct supervision/assist for medications management;Assistance with cooking/housework    Equipment Recommendations Other (comment) (TBD)  Recommendations for Other Services       Functional Status Assessment Patient has had a recent decline in their functional status and demonstrates the ability to make significant improvements in function in a reasonable and predictable amount of time.     Precautions / Restrictions Precautions Precautions: Fall Restrictions Weight Bearing Restrictions: No      Mobility  Bed Mobility Overal bed mobility: Needs Assistance Bed Mobility: Supine to Sit, Sit to Supine     Supine to sit: Max assist, +2 for physical assistance Sit to supine: Max assist        Transfers                   General transfer comment: pt declined at this time due to pain    Ambulation/Gait                  Stairs            Wheelchair Mobility    Modified Rankin (Stroke Patients Only)       Balance Overall balance assessment: Needs assistance Sitting-balance support: Feet supported, Bilateral upper extremity supported Sitting balance-Leahy Scale: Fair                                       Pertinent Vitals/Pain Pain Assessment Pain Assessment: 0-10 Pain Score: 9  Pain Location: back Pain Descriptors / Indicators: Aching, Constant, Guarding, Grimacing Pain Intervention(s): Limited activity within patient's tolerance, Monitored during session, Premedicated before  session, Repositioned    Home Living Family/patient expects to be discharged to:: Private residence Living Arrangements: Alone Available Help at Discharge: Friend(s);Available PRN/intermittently Type of Home: House Home Access: Stairs to enter Entrance Stairs-Rails: Right Entrance Stairs-Number of Steps: 3   Home Layout: One level Home Equipment: Grab bars -  tub/shower;Cane - quad;Tub bench;Toilet riser;Rollator (4 wheels);Hand held shower head      Prior Function Prior Level of Function : Independent/Modified Independent             Mobility Comments: At baseline, pt is independent with quad cane for functional mobility. ADLs Comments: independent in ADLs. drives, eats out pt stated he does not cook     Hand Dominance        Extremity/Trunk Assessment   Upper Extremity Assessment Upper Extremity Assessment: Defer to OT evaluation    Lower Extremity Assessment Lower Extremity Assessment: Generalized weakness (able to lift against gravity but painful per pt)       Communication      Cognition Arousal/Alertness: Awake/alert Behavior During Therapy: WFL for tasks assessed/performed Overall Cognitive Status: Within Functional Limits for tasks assessed                                          General Comments      Exercises General Exercises - Lower Extremity Ankle Circles/Pumps: AROM, Both, 10 reps Gluteal Sets: AROM, Strengthening, Both, 10 reps Heel Slides: AROM, Strengthening, Both, 10 reps Hip ABduction/ADduction: AROM, Strengthening, Both, 10 reps   Assessment/Plan    PT Assessment Patient needs continued PT services  PT Problem List Decreased strength;Decreased mobility;Decreased range of motion;Decreased activity tolerance;Decreased knowledge of precautions;Decreased balance;Pain;Decreased knowledge of use of DME       PT Treatment Interventions Therapeutic activities;DME instruction;Gait training;Therapeutic exercise;Patient/family education;Stair training;Balance training;Functional mobility training;Neuromuscular re-education    PT Goals (Current goals can be found in the Care Plan section)  Acute Rehab PT Goals Patient Stated Goal: to have less pain PT Goal Formulation: With patient Time For Goal Achievement: 11/03/21 Potential to Achieve Goals: Fair    Frequency Min 2X/week      Co-evaluation               AM-PAC PT "6 Clicks" Mobility  Outcome Measure Help needed turning from your back to your side while in a flat bed without using bedrails?: A Lot Help needed moving from lying on your back to sitting on the side of a flat bed without using bedrails?: A Lot Help needed moving to and from a bed to a chair (including a wheelchair)?: A Lot Help needed standing up from a chair using your arms (e.g., wheelchair or bedside chair)?: A Lot Help needed to walk in hospital room?: A Lot Help needed climbing 3-5 steps with a railing? : Total 6 Click Score: 11    End of Session Equipment Utilized During Treatment: Oxygen Activity Tolerance: Patient limited by pain Patient left: in bed;with bed alarm set;with call bell/phone within reach;with family/visitor present Nurse Communication: Mobility status PT Visit Diagnosis: Other abnormalities of gait and mobility (R26.89);History of falling (Z91.81);Muscle weakness (generalized) (M62.81)    Time: 6063-0160 PT Time Calculation (min) (ACUTE ONLY): 21 min   Charges:   PT Evaluation $PT Eval Low Complexity: 1 Low PT Treatments $Therapeutic Activity: 8-22 mins       Lieutenant Diego PT, DPT 2:38 PM,10/20/21

## 2021-10-20 NOTE — Plan of Care (Signed)

## 2021-10-21 DIAGNOSIS — R52 Pain, unspecified: Secondary | ICD-10-CM | POA: Diagnosis not present

## 2021-10-21 LAB — BASIC METABOLIC PANEL
Anion gap: 8 (ref 5–15)
BUN: 62 mg/dL — ABNORMAL HIGH (ref 8–23)
CO2: 20 mmol/L — ABNORMAL LOW (ref 22–32)
Calcium: 8.5 mg/dL — ABNORMAL LOW (ref 8.9–10.3)
Chloride: 109 mmol/L (ref 98–111)
Creatinine, Ser: 2.18 mg/dL — ABNORMAL HIGH (ref 0.61–1.24)
GFR, Estimated: 28 mL/min — ABNORMAL LOW (ref >60)
Glucose, Bld: 139 mg/dL — ABNORMAL HIGH (ref 70–99)
Potassium: 5.1 mmol/L (ref 3.5–5.1)
Sodium: 137 mmol/L (ref 135–145)

## 2021-10-21 LAB — GLUCOSE, CAPILLARY: Glucose-Capillary: 125 mg/dL — ABNORMAL HIGH (ref 70–99)

## 2021-10-21 MED ORDER — VANCOMYCIN HCL IN DEXTROSE 1-5 GM/200ML-% IV SOLN
1000.0000 mg | INTRAVENOUS | Status: AC
  Filled 2021-10-21: qty 200

## 2021-10-21 MED ORDER — SODIUM CHLORIDE 0.9 % IV SOLN: INTRAVENOUS | Status: DC

## 2021-10-21 NOTE — Plan of Care (Signed)
  Problem: Education: Goal: Knowledge of General Education information will improve Description: Including pain rating scale, medication(s)/side effects and non-pharmacologic comfort measures Outcome: Progressing   Problem: Health Behavior/Discharge Planning: Goal: Ability to manage health-related needs will improve Outcome: Progressing   Problem: Clinical Measurements: Goal: Cardiovascular complication will be avoided Outcome: Progressing   Problem: Activity: Goal: Risk for activity intolerance will decrease Outcome: Progressing   Problem: Nutrition: Goal: Adequate nutrition will be maintained Outcome: Progressing   Problem: Coping: Goal: Level of anxiety will decrease Outcome: Progressing   Problem: Elimination: Goal: Will not experience complications related to bowel motility Outcome: Progressing   Problem: Pain Managment: Goal: General experience of comfort will improve Outcome: Progressing   Problem: Safety: Goal: Ability to remain free from injury will improve Outcome: Progressing   Problem: Skin Integrity: Goal: Risk for impaired skin integrity will decrease Outcome: Progressing

## 2021-10-21 NOTE — Progress Notes (Signed)
0 PROGRESS NOTE    Jacob [name]  CHY:[id number] DOB: 24-Jan-1928 DOA: 10/18/2021 PCP: [name], MD    Brief Narrative:  88 year old male with history of L1 compression fracture, BPH, bladder tumor, status post TURPT, urocystoscopy, ureteral tumor biopsy, atrial fibrillation, hypertension, GERD, history of diverticulosis with diverticulitis, renal cell carcinoma status post left nephroureterectomy in August 2015, hyperlipidemia, Mobitz type II AV block, status postcardiac pacemaker placement in 2015, who presents emergency department for chief concerns of low back pain.  Pursued CT abdomen pelvis given concern for possible lytic lesions of metastatic disease.  Study limited by lack of IV contrast however no metastatic disease noted.  There was L4 compression fracture with 60 to 70% height loss.  Also with AKI on CKD stage IV.  Nephrology consult requested. Interventional radiology has approved patient for kyphoplasty.  Timing of procedure unclear.     Assessment & Plan:   Principal Problem:   Inadequate pain control Active Problems:   Hyperlipidemia   Left bundle branch block   Urothelial carcinoma of kidney (HCC)   HTN (hypertension)   Renal cancer, left (HCC)   Mobitz type 2 second degree AV block   COPD (chronic obstructive pulmonary disease) (HCC)   GERD (gastroesophageal reflux disease)   Stage 3a chronic kidney disease (CKD) (HCC)   Type 2 diabetes mellitus without complication, without long-term current use of insulin (HCC)   Closed compression fracture of L3 lumbar vertebra, initial encounter (HCC)   Hyperkalemia   AKI (acute kidney injury) ([place])   Heart failure with preserved ejection fraction (HCC)   Dysuria   Lumbar radiculopathy  Intractable back pain L4 compression fracture Patient has had poor tolerance of p.o. narcotic regimen.  States it constipates him and does not control his pain.  TLSO brace counseled.  Patient states this is uncomfortable. -IR  approved patient for kyphoplasty -Neurosurgery recommends LSO brace when upright.  Does not recommend surgical intervention at this time Plan: continue multimodal pain regimen and bowel regimen TLSO brace when upright  AKI on CKD stage IV Patient was prescribed Bactrim prior to cystoscopy.  This has been held.  He is established with Dr. Murlean Iba for outpatient nephrology. Procalcitonin 0.24 Plan: Resume IV fluids, normal saline at 75 cc/h x 15 hours Nephrology following, recommendations appreciated  Dysuria Unclear etiology.  Unable to exclude urinary tract infection at this time.  Could be secondary to recent bladder instrumentation Procalcitonin 0.24 Plan: Continue IV Rocephin Plan on 5-day stop date  GERD PPI  Hypertension PTA Norvasc  Hyperlipidemia PTA Lipitor   DVT prophylaxis: SQ heparin Code Status: Full Family Communication: Jacob [name] [phone] on 10/19 Disposition Plan: Status is: Inpatient Remains inpatient appropriate because: Intractable back pain secondary to L4 compression fracture.  Will require kyphoplasty.  Procedure pending.  Intractable pain requiring narcotic administration    Level of care: Med-Surg  Consultants:  IR Nephrology  Procedures:  None  Antimicrobials: Ceftriaxone   Subjective: Seen and examined.  Still in pain.  Minimal response to medications  Objective: Vitals:   10/20/21 0854 10/20/21 1544 10/20/21 2247 10/21/21 0832  BP: (!) 130/52 (!) 117/41 (!) 130/48 (!) 135/44  Pulse: 75 62 (!) 59 67  Resp: 16 16 20 20   Temp: 98.6 F (37 C) 98.2 F (36.8 C) 99 F (37.2 C) 98.6 F (37 C)  TempSrc:      SpO2: 98% 96% 97% 96%  Weight:        Intake/Output Summary (Last 24 hours) at 10/21/2021  1100 Last data filed at 10/21/2021 1022 Gross per 24 hour  Intake 1564.28 ml  Output 1700 ml  Net -135.72 ml   Filed Weights   10/18/21 1647  Weight: 77.1 kg    Examination:  General exam: No acute  distress Respiratory system: Clear to auscultation. Respiratory effort normal. Cardiovascular system: S1-2, RRR, no murmurs, no pedal edema Gastrointestinal system: Soft, NT/ND, normal bowel sounds Central nervous system: Alert and oriented. No focal neurological deficits. Extremities: Decreased strength bilateral lower extremities.  Decreased ROM secondary to pain Skin: No rashes, lesions or ulcers Psychiatry: Judgement and insight appear normal. Mood & affect appropriate.     Data Reviewed: I have personally reviewed following labs and imaging studies  CBC: Recent Labs  Lab 10/18/21 1951 10/19/21 0026 10/20/21 0812  WBC 8.3 7.1 6.0  NEUTROABS  --   --  4.7  HGB 9.6* 8.8* 8.9*  HCT 30.6* 28.0* 28.8*  MCV 96.8 95.6 98.3  PLT 155 151 791   Basic Metabolic Panel: Recent Labs  Lab 10/18/21 1951 10/18/21 2302 10/20/21 0815  NA 131* 135 135  K 5.4* 5.4* 5.4*  CL 101 108 109  CO2 20* 21* 18*  GLUCOSE 110* 107* 97  BUN 59* 63* 61*  CREATININE 3.59* 3.66* 2.84*  CALCIUM 8.8* 9.1 8.6*  MG  --  2.4  --   PHOS  --  6.0* 4.2   GFR: Estimated Creatinine Clearance: 16.9 mL/min (A) (by C-G formula based on SCr of 2.84 mg/dL (H)). Liver Function Tests: Recent Labs  Lab 10/20/21 0815  ALBUMIN 2.9*   No results for input(s): "LIPASE", "AMYLASE" in the last 168 hours. No results for input(s): "AMMONIA" in the last 168 hours. Coagulation Profile: No results for input(s): "INR", "PROTIME" in the last 168 hours. Cardiac Enzymes: No results for input(s): "CKTOTAL", "CKMB", "CKMBINDEX", "TROPONINI" in the last 168 hours. BNP (last 3 results) No results for input(s): "PROBNP" in the last 8760 hours. HbA1C: No results for input(s): "HGBA1C" in the last 72 hours. CBG: Recent Labs  Lab 10/21/21 0744  GLUCAP 125*   Lipid Profile: No results for input(s): "CHOL", "HDL", "LDLCALC", "TRIG", "CHOLHDL", "LDLDIRECT" in the last 72 hours. Thyroid Function Tests: No results for  input(s): "TSH", "T4TOTAL", "FREET4", "T3FREE", "THYROIDAB" in the last 72 hours. Anemia Panel: No results for input(s): "VITAMINB12", "FOLATE", "FERRITIN", "TIBC", "IRON", "RETICCTPCT" in the last 72 hours. Sepsis Labs: Recent Labs  Lab 10/19/21 0026  PROCALCITON 0.24    No results found for this or any previous visit (from the past 240 hour(s)).       Radiology Studies: No results found.      Scheduled Meds:  amLODipine  5 mg Oral Daily   atorvastatin  40 mg Oral Daily   fluticasone furoate-vilanterol  1 puff Inhalation Daily   And   umeclidinium bromide  1 puff Inhalation Daily   gabapentin  300 mg Oral BID   heparin  5,000 Units Subcutaneous Q8H   polyethylene glycol  17 g Oral Daily   senna-docusate  1 tablet Oral BID   Continuous Infusions:  cefTRIAXone (ROCEPHIN)  IV Stopped (10/20/21 2320)     LOS: 1 day      Sidney Ace, MD [facility]   If 7PM-7AM, please contact night-coverage  10/21/2021, 11:00 AM

## 2021-10-21 NOTE — Plan of Care (Signed)

## 2021-10-21 NOTE — Progress Notes (Signed)
Occupational Therapy Treatment Patient Details Name: Jacob [name] MRN: [id number] DOB: [birth year] Today's Date: 10/21/2021   History of present illness Pt is a 88 yo male that presented to ED for low back pain. noted for L4 compression fracture. Past medical history significant for GERD, diverticulitis, HTN, HDL, renal cancer, and prostate cancer, previous lumbar fractures, COPD, LBBB, pacemaker.   OT comments  Jacob [name] was able to move much better today than during yesterday's session. He could elevate both LE from a supine position, came to EOB sitting with Mod A, able to come into standing with Mod A + RW and to maintain standing balance for ~ 45 seconds. Required Mod-Max A for sitting<supine, with particular support for elevating LE to bed level. Provided educ re: using pillows, repositioning to increase comfort/reduce pain. Pt engaged in easy therex movements at bed level. Continue to anticipate pt will require DC to SNF.   Recommendations for follow up therapy are one component of a multi-disciplinary discharge planning process, led by the attending physician.  Recommendations may be updated based on patient status, additional functional criteria and insurance authorization.    Follow Up Recommendations  Skilled nursing-short term rehab (<3 hours/day)    Assistance Recommended at Discharge Frequent or constant Supervision/Assistance  Patient can return home with the following  A lot of help with walking and/or transfers;A lot of help with bathing/dressing/bathroom;Assistance with cooking/housework;Help with stairs or ramp for entrance;Assist for transportation   Equipment Recommendations  None recommended by OT    Recommendations for Other Services      Precautions / Restrictions Precautions Precautions: Fall Restrictions Weight Bearing Restrictions: No       Mobility Bed Mobility Overal bed mobility: Needs Assistance Bed Mobility: Supine to Sit, Sit to Supine      Supine to sit: Mod assist Sit to supine: Mod assist        Transfers Overall transfer level: Needs assistance Equipment used: Quad cane Transfers: Sit to/from Stand Sit to Stand: Mod assist                 Balance Overall balance assessment: Needs assistance Sitting-balance support: Feet supported, Bilateral upper extremity supported Sitting balance-Leahy Scale: Fair Sitting balance - Comments: Pt had difficulty moving towards EOB in preparation for standing   Standing balance support: Bilateral upper extremity supported Standing balance-Leahy Scale: Fair Standing balance comment: able to maintain standing balance ~ 45 seconds                           ADL either performed or assessed with clinical judgement   ADL                                              Extremity/Trunk Assessment Upper Extremity Assessment Upper Extremity Assessment: Generalized weakness   Lower Extremity Assessment Lower Extremity Assessment: Generalized weakness        Vision       Perception     Praxis      Cognition Arousal/Alertness: Awake/alert Behavior During Therapy: WFL for tasks assessed/performed Overall Cognitive Status: Within Functional Limits for tasks assessed  Exercises Other Exercises Other Exercises: UE and LE therex in supine    Shoulder Instructions       General Comments      Pertinent Vitals/ Pain       Pain Assessment Pain Assessment: 0-10 Pain Score: 8  Pain Location: lower back Pain Descriptors / Indicators: Aching, Constant, Guarding, Grimacing, Shooting, Sharp Pain Intervention(s): Premedicated before session, Repositioned  Home Living                                          Prior Functioning/Environment              Frequency  Min 2X/week        Progress Toward Goals  OT Goals(current goals can now be found in the  care plan section)  Progress towards OT goals: Progressing toward goals  Acute Rehab OT Goals OT Goal Formulation: With patient Time For Goal Achievement: 11/03/21 Potential to Achieve Goals: Good  Plan Discharge plan remains appropriate;Frequency remains appropriate    Co-evaluation                 AM-PAC OT "6 Clicks" Daily Activity     Outcome Measure   Help from another person eating meals?: None Help from another person taking care of personal grooming?: A Little Help from another person toileting, which includes using toliet, bedpan, or urinal?: A Lot Help from another person bathing (including washing, rinsing, drying)?: A Lot Help from another person to put on and taking off regular upper body clothing?: A Lot Help from another person to put on and taking off regular lower body clothing?: A Lot 6 Click Score: 15    End of Session Equipment Utilized During Treatment: Rolling walker (2 wheels)  OT Visit Diagnosis: Unsteadiness on feet (R26.81);Muscle weakness (generalized) (M62.81);Pain   Activity Tolerance Patient tolerated treatment well   Patient Left in bed;with call bell/phone within reach;with bed alarm set;with nursing/sitter in room   Nurse Communication          Time: 1001-1018 OT Time Calculation (min): 17 min  Charges: OT General Charges $OT Visit: 1 Visit OT Treatments $Self Care/Home Management : 8-22 mins Jacob Lobo, PhD, MS, OTR/L 10/21/21, 11:10 AM

## 2021-10-21 NOTE — Progress Notes (Addendum)
[facility]  ROUNDING NOTE   Subjective:   Jacob [name] is a 88 y.o. male with past medical history including past medical history includes atrial fibrillation, GERD, hypertension, BPH, bladder tumor status post TURPT, urethral tumor biopsy, urocystoscopy and  chronic kidney disease stage IV.  Patient presents to the emergency department with complaints of back pain and has been admitted under observation for Hyperkalemia [E87.5] Acute kidney insufficiency [N28.9] Inadequate pain control [R52] Severe back pain [M54.9] Closed compression fracture of L3 lumbar vertebra, initial encounter ([place]) [S32.030A] Constipation, unspecified constipation type [K59.00] Lumbar radiculopathy [M54.16]  Patient is known to our practice and is followed outpatient by Dr. Candiss Norse.  Last seen in office in July of this year with worsening renal function noted.    Patient seen sitting at bedside No family at bedside Appetite slowly improving Complains of indigestion   Objective:  Vital signs in last 24 hours:  Temp:  [98.2 F (36.8 C)-99 F (37.2 C)] 98.6 F (37 C) (10/20 0832) Pulse Rate:  [59-67] 67 (10/20 0832) Resp:  [16-20] 20 (10/20 0832) BP: (117-135)/(41-48) 135/44 (10/20 0832) SpO2:  [96 %-97 %] 96 % (10/20 0832)  Weight change:  Filed Weights   10/18/21 1647  Weight: 77.1 kg    Intake/Output: I/O last 3 completed shifts: In: 1204.3 [I.V.:1004.3; IV Piggyback:200] Out: 2700 [Urine:2700]   Intake/Output this shift:  Total I/O In: 360 [P.O.:360] Out: -   Physical Exam: General: NAD, resting comfortably  Head: Normocephalic, atraumatic. Moist oral mucosal membranes  Eyes: Anicteric  Lungs:  Clear to auscultation, normal effort, [place] O2  Heart: Regular rate and rhythm  Abdomen:  Soft, nontender  Extremities: No peripheral edema.  Neurologic: Nonfocal, moving all four extremities  Skin: No lesions  Access: None    Basic Metabolic Panel: Recent Labs  Lab  10/18/21 1951 10/18/21 2302 10/20/21 0815 10/21/21 1030  NA 131* 135 135 137  K 5.4* 5.4* 5.4* 5.1  CL 101 108 109 109  CO2 20* 21* 18* 20*  GLUCOSE 110* 107* 97 139*  BUN 59* 63* 61* 62*  CREATININE 3.59* 3.66* 2.84* 2.18*  CALCIUM 8.8* 9.1 8.6* 8.5*  MG  --  2.4  --   --   PHOS  --  6.0* 4.2  --      Liver Function Tests: Recent Labs  Lab 10/20/21 0815  ALBUMIN 2.9*    No results for input(s): "LIPASE", "AMYLASE" in the last 168 hours. No results for input(s): "AMMONIA" in the last 168 hours.  CBC: Recent Labs  Lab 10/18/21 1951 10/19/21 0026 10/20/21 0812  WBC 8.3 7.1 6.0  NEUTROABS  --   --  4.7  HGB 9.6* 8.8* 8.9*  HCT 30.6* 28.0* 28.8*  MCV 96.8 95.6 98.3  PLT 155 151 155     Cardiac Enzymes: No results for input(s): "CKTOTAL", "CKMB", "CKMBINDEX", "TROPONINI" in the last 168 hours.  BNP: Invalid input(s): "POCBNP"  CBG: Recent Labs  Lab 10/21/21 0744  GLUCAP 125*    Microbiology: Results for orders placed or performed in visit on 10/05/21  Microscopic Examination     Status: None   Collection Time: 10/05/21  2:07 PM   Urine  Result Value Ref Range Status   WBC, UA 0-5 0 - 5 /hpf Final   RBC, Urine 0-2 0 - 2 /hpf Final   Epithelial Cells (non renal) 0-10 0 - 10 /hpf Final   Bacteria, UA None seen None seen/Few Final  CULTURE, URINE COMPREHENSIVE  Status: Abnormal   Collection Time: 10/05/21  3:07 PM   Specimen: Urine   UR  Result Value Ref Range Status   Urine Culture, Comprehensive Final report (A)  Final   Organism ID, Bacteria Proteus mirabilis (A)  Final    Comment: Cefazolin <=4 ug/mL Cefazolin with an MIC <=16 predicts susceptibility to the oral agents cefaclor, cefdinir, cefpodoxime, cefprozil, cefuroxime, cephalexin, and loracarbef when used for therapy of uncomplicated urinary tract infections due to E. coli, Klebsiella pneumoniae, and Proteus mirabilis. 10,000-25,000 colony forming units per mL    ANTIMICROBIAL  SUSCEPTIBILITY Comment  Final    Comment:       ** S = Susceptible; I = Intermediate; R = Resistant **                    P = Positive; N = Negative             MICS are expressed in micrograms per mL    Antibiotic                 RSLT#1    RSLT#2    RSLT#3    RSLT#4 Amoxicillin/Clavulanic Acid    S Ampicillin                     S Cefepime                       S Ceftriaxone                    S Cefuroxime                     S Ciprofloxacin                  S Ertapenem                      S Gentamicin                     S Levofloxacin                   S Meropenem                      S Nitrofurantoin                 R Piperacillin/Tazobactam        S Tetracycline                   R Tobramycin                     S Trimethoprim/Sulfa             S     Coagulation Studies: No results for input(s): "LABPROT", "INR" in the last 72 hours.  Urinalysis: Recent Labs    10/18/21 2303  COLORURINE AMBER*  LABSPEC 1.013  PHURINE 5.0  GLUCOSEU NEGATIVE  HGBUR MODERATE*  BILIRUBINUR NEGATIVE  KETONESUR NEGATIVE  PROTEINUR 100*  NITRITE NEGATIVE  LEUKOCYTESUR SMALL*       Imaging: No results found.   Medications:    sodium chloride 75 mL/hr at 10/21/21 1128   cefTRIAXone (ROCEPHIN)  IV Stopped (10/20/21 2320)    amLODipine  5 mg Oral Daily   atorvastatin  40 mg Oral Daily   fluticasone furoate-vilanterol  1 puff Inhalation Daily  And   umeclidinium bromide  1 puff Inhalation Daily   gabapentin  300 mg Oral BID   heparin  5,000 Units Subcutaneous Q8H   polyethylene glycol  17 g Oral Daily   senna-docusate  1 tablet Oral BID   acetaminophen **OR** acetaminophen, albuterol, bisacodyl, diphenhydrAMINE, HYDROmorphone (DILAUDID) injection, ondansetron **OR** ondansetron (ZOFRAN) IV, oxybutynin, oxyCODONE, pantoprazole  Assessment/ Plan:  [name] is a 88 y.o.  male with past medical history including past medical history includes atrial fibrillation,  GERD, hypertension, BPH, bladder tumor status post TURPT, urethral tumor biopsy, urocystoscopy and chronic kidney disease stage IV.  Patient presents to the emergency department with complaints of back pain and has been admitted under observation for Hyperkalemia [E87.5] Acute kidney insufficiency [N28.9] Inadequate pain control [R52] Severe back pain [M54.9] Closed compression fracture of L3 lumbar vertebra, initial encounter ([place]) [S32.030A] Constipation, unspecified constipation type [K59.00] Lumbar radiculopathy [M54.16]   Acute Kidney Injury with hyperkalemia on chronic kidney disease stage IV with baseline creatinine 1.95 and GFR of 32 on 02/01/21.  Patient is s/p left nephrectomy Acute kidney etiology unclear at this time. Worsening renal function noted outpatient.  Chronic kidney disease is secondary to hypertensive nephropathy. CT Abd/Pelvis negative for obstruction. We agree with holding Bactrim and offering IV fluids for now.  Creatinine from 3.66. to now at 2.18 Adequate urine output noted, 1.7L in past 24 hours No acute need for dialysis at this time.  Continue to avoid nephrotoxic agents and therapies, if possible.   Lab Results  Component Value Date   CREATININE 2.18 (H) 10/21/2021   CREATININE 2.84 (H) 10/20/2021   CREATININE 3.66 (H) 10/18/2021    Intake/Output Summary (Last 24 hours) at 10/21/2021 1223 Last data filed at 10/21/2021 1022 Gross per 24 hour  Intake 1564.28 ml  Output 1700 ml  Net -135.72 ml    2. Hypertension with chronic kidney disease. Home regimen includes amlodipine and isosorbide. Only receiving amlodipine at this time.   3. Dysuria likely due to recent [place]. Bactrim stopped in setting of kidney injury, continues receiving Rocephin.   4. Anemia of chronic kidney disease: Normocytic Lab Results  Component Value Date   HGB 8.9 (L) 10/20/2021    Hemoglobin below desired target, will hold ESA or iron supplementation due to concern for  malignancy.   5. Hyperphosphatemia, outpatient phosphorus 3.7 on 01/04/21.Corrected to 4.2. Will continue to monitor.   LOS: 1 Shantelle Breeze 10/20/202312:23 PM  I saw and evaluated the patient with Colon Flattery, NP.  I personally formulated the plan of care.  I agree with the findings and plan as documented in the note except as noted .

## 2021-10-21 NOTE — Consult Note (Signed)
Chief Complaint: Patient was seen in consultation today for  Image-guided kyphoplasty of L4 vertebrae   Referring Physician(s): Ralene Muskrat, MD  Supervising Physician: Jacqulynn Cadet  Patient Status: [place] - In-pt  History of Present Illness: Jacob [name] is a 88 y.o. male with complex PMH including anemia, COPD, hypertension, atrial fibrillation, pacemaker placement, and CKD being seen today in evaluation for image-guided L4 kyphoplasty. Patient underwent cystoscopy with laser ablation of right distal ureteral tumor on 10/17/21. The patient presented to [facility] ED on 10/17 with severe lower back pain. Work-up revealed an L4 compression fracture with 60-70% loss of height. The patient reports significant disruption to ADLs due to lower back pain. The patient was referred to IR for image-guided L4 kyphoplasty.  Past Medical History:  Diagnosis Date   Anemia    Arthritis of both hands    BPH (benign prostatic hypertrophy)    COPD (chronic obstructive pulmonary disease) (HCC)    Coronary artery disease    a.) LHC 11/18/2004: 40% pLAD, 50% mLAD, 80% D1, 50% mLCx, 100% mRCA - refer to [facility] for PCI; b.) LHC/PCI 11/21/2004: 100% mRCA (3.5 x 23 mm Cypher DES), 95% dRCA (2.5 x 28 mm Cypher); b.) 05/03/12 Cardiac CT: patent RCA stents, nonobs dzs;  c.) 01/2013 Ex MV: nl EF, no ischemia.   Diastolic dysfunction    a.) TTE 08/29/2013: EF 60-65%, mild LVH, mild BAE, mild MR, mod TR, G1DD; b.) TTE 07/01/2020: EF 50-55%, LAEm triv MR, mild-mod Ao sclerosis with no stenosis, G2DD   GERD (gastroesophageal reflux disease)    Glaucoma of both eyes    H/O hiatal hernia    History of atrial fibrillation 2006   History of diverticulitis    History of kidney stones    History of renal cell carcinoma    a.) s/p LEFT nephroureterectomy   Hyperlipidemia    Hypertension    LBBB (left bundle branch block)    Mobitz type 2 second degree AV block    a.) s/p PPM placement 08/29/2013   Pneumonia     Presence of permanent cardiac pacemaker    Recurrent bladder transitional cell carcinoma ([place])    a.) s/p TURBT and intravesical chemotherapy   Renal insufficiency    a. Creat rose to 2.34 post-op L nephrectomy.   S/P placement of cardiac pacemaker 08/29/2013   a.) Medtroinc device placed for symptomatic bradycardia and mobitz 2 second degree heart block   Symptomatic bradycardia    a.) s/p PPM placement 08/29/2013    Past Surgical History:  Procedure Laterality Date   APPENDECTOMY  02/03/1988   BLADDER INSTILLATION N/A 10/17/2021   Procedure: BLADDER INSTILLATION OF GEMCITABINE;  Surgeon: [name], MD;  Location: [facility];  Service: Urology;  Laterality: N/A;   CATARACT EXTRACTION W/ INTRAOCULAR LENS  IMPLANT, BILATERAL  01/02/2009   COLONOSCOPY     CORONARY ANGIOPLASTY WITH STENT PLACEMENT  11/21/2004   Procedure: CORONARY ANGIOPLASTY WITH STENT PLACEMENT (mRCA and dRCA); Location: [facility]; Surgeon: Maryjean Morn, MD   CYSTOSCOPY W/ RETROGRADES Right 01/13/2014   Procedure: CYSTOSCOPY WITH RETROGRADE PYELOGRAM;  Surgeon: [name], MD;  Location: [facility];  Service: Urology;  Laterality: Right;   CYSTOSCOPY W/ URETERAL STENT PLACEMENT Left 08/22/2013   Procedure: CYSTOSCOPY WITH RETROGRADE PYELOGRAM/URETERAL STENT PLACEMENT;  Surgeon: [name], MD;  Location: [facility];  Service: Urology;  Laterality: Left;   CYSTOSCOPY W/ URETERAL STENT PLACEMENT Right 10/17/2021   Procedure: CYSTOSCOPY WITH RETROGRADE PYELOGRAM/URETERAL STENT PLACEMENT;  Surgeon: [name],  Caryl Pina, MD;  Location: [facility];  Service: Urology;  Laterality: Right;   CYSTOSCOPY WITH BIOPSY  04/13/2011   Procedure: CYSTOSCOPY WITH BIOPSY;  Surgeon: [name], MD;  Location: [facility];  Service: Urology;  Laterality: N/A;  Cold Cup Biopsy   CYSTOSCOPY WITH RETROGRADE PYELOGRAM, URETEROSCOPY AND STENT PLACEMENT Left 06/19/2013   Procedure: CYSTOSCOPY WITH LEFT RETROGRADE PYELOGRAM, LEFT  URETEROSCOPY, ureteral balloon dilatation, BIOPSY LEFT KIDNEY;  Surgeon: [name], MD;  Location: [facility];  Service: Urology;  Laterality: Left;   INGUINAL HERNIA REPAIR Left 08/22/2013   Procedure: LAPAROSCOPIC INGUINAL HERNIA;  Surgeon: [name], MD;  Location: [facility];  Service: Urology;  Laterality: Left;   IR KYPHO LUMBAR INC FX REDUCE BONE BX UNI/BIL CANNULATION INC/IMAGING  01/31/2021   LEFT HEART CATH AND CORONARY ANGIOGRAPHY Left 11/18/2004   Procedure: LEFT HEART CATH AND CORONARY ANGIOGRAPHY; Location: [place]; Surgeon: Neoma Laming, MD   PERMANENT PACEMAKER INSERTION N/A 08/29/2013   Procedure: PERMANENT PACEMAKER INSERTION;  Surgeon: [name], MD;  Location: [facility] CATH LAB;  Service: Cardiovascular;  Laterality: N/A;  Medtronic dual-chamber   ROBOT ASSITED LAPAROSCOPIC NEPHROURETERECTOMY Left 08/22/2013   Procedure: ROBOT ASSITED LAPAROSCOPIC NEPHROURETERECTOMY;  Surgeon: [name], MD;  Location: [facility];  Service: Urology;  Laterality: Left;   TRANSURETHRAL RESECTION OF BLADDER TUMOR  12/22/2010   TRANSURETHRAL RESECTION OF BLADDER TUMOR  08/04/2011   Procedure: TRANSURETHRAL RESECTION OF BLADDER TUMOR (TURBT);  Surgeon: [name], MD;  Location: [facility];  Service: Urology;  Laterality: N/A;   TRANSURETHRAL RESECTION OF BLADDER TUMOR N/A 01/13/2014   Procedure: TRANSURETHRAL RESECTION OF BLADDER TUMOR (TURBT);  Surgeon: [name], MD;  Location: [facility];  Service: Urology;  Laterality: N/A;   TRANSURETHRAL RESECTION OF BLADDER TUMOR  10/17/2021   Procedure: TRANSURETHRAL RESECTION OF BLADDER TUMOR (TURBT);  Surgeon: [name], MD;  [facility]: [facility];  Service: Urology;;   URETERAL BIOPSY  10/17/2021   Procedure: URETERAL BIOPSY;  Surgeon: [name], MD;  Location: [facility];  Service: Urology;;   URETEROSCOPY  10/17/2021   Procedure: URETEROSCOPY;  Surgeon: [name], MD;   Location: [facility];  Service: Urology;;    Allergies: Penicillins  Medications: Prior to Admission medications   Medication Sig Start Date End Date Taking? Authorizing Provider  amLODipine (NORVASC) 5 MG tablet Take 1 tablet (5 mg total) by mouth daily. 09/27/21 12/26/21 Yes Deboraha Sprang, MD  aspirin 81 MG tablet Take 81 mg by mouth daily.   Yes [provider]  atorvastatin (LIPITOR) 40 MG tablet Take 1 tablet (40 mg total) by mouth daily. Needs appointment for future refills / 2nd attempt 10/05/21  Yes Deboraha Sprang, MD  Budeson-Glycopyrrol-Formoterol (BREZTRI AEROSPHERE) 160-9-4.8 MCG/ACT AERO Inhale into the lungs in the morning and at bedtime.   Yes [provider]  esomeprazole (NEXIUM) 20 MG capsule Take 20 mg by mouth as needed. 05/24/20  Yes [provider]  isosorbide mononitrate (IMDUR) 30 MG 24 hr tablet Take 30 mg by mouth at bedtime.   Yes [provider]  latanoprost (XALATAN) 0.005 % ophthalmic solution Place 1 drop into both eyes at bedtime.   Yes [provider]  acetaminophen (TYLENOL) 325 MG tablet Take 650 mg by mouth every 4 (four) hours as needed.    [provider]  cetirizine (ZYRTEC) 10 MG tablet Take 10 mg by mouth as needed.    [provider]  empagliflozin (JARDIANCE) 10  MG TABS tablet Take 1 tablet (10 mg) by mouth once daily as directed Patient not taking: Reported on 10/18/2021 09/27/21   Deboraha Sprang, MD  HYDROcodone-acetaminophen (NORCO/VICODIN) 5-325 MG tablet Take 1-2 tablets by mouth every 6 (six) hours as needed for moderate pain. 10/17/21   [name], MD  Multiple Vitamins-Minerals (MULTIVITAMINS THER. W/MINERALS) TABS Take 1 tablet by mouth daily. Patient not taking: Reported on 10/18/2021    [provider]  Omega-3 Fatty Acids (FISH OIL) 1200 MG CAPS Take 1,200 mg by mouth daily. Patient not taking: Reported on 10/18/2021    [provider]  oxybutynin  (DITROPAN) 5 MG tablet Take 1 tablet (5 mg total) by mouth every 8 (eight) hours as needed for bladder spasms. 10/17/21   [name], MD  PROAIR HFA 108 [phone] Base) MCG/ACT inhaler Inhale 1 puff into the lungs every 4 (four) hours as needed.  06/14/16   [provider]  senna-docusate (SENOKOT-S) 8.6-50 MG per tablet Take 1 tablet by mouth 2 (two) times daily. While taking pain meds to prevent constipation Patient taking differently: Take 1 tablet by mouth at bedtime as needed. While taking pain meds to prevent constipation 08/26/13   [name], MD  sulfamethoxazole-trimethoprim (BACTRIM DS) 800-160 MG tablet Take 1 tablet by mouth every 12 (twelve) hours. 10/10/21   [name], MD  triamcinolone cream (KENALOG) 0.1 % SMARTSIG:sparingly Topical Twice Daily 10/11/21   [provider]     Family History  Problem Relation Age of Onset   Heart attack Mother    Heart attack Father    Prostate cancer Neg Hx    Bladder Cancer Neg Hx    Kidney cancer Neg Hx     Social History   Socioeconomic History   Marital status: Widowed    Spouse name: Not on file   Number of children: Not on file   Years of education: Not on file   Highest education level: Not on file  Occupational History   Not on file  Tobacco Use   Smoking status: Former    Packs/day: 1.00    Years: 50.00    Total pack years: 50.00    Types: Cigarettes    Quit date: 11/02/2000    Years since quitting: 20.9   Smokeless tobacco: Never  Vaping Use   Vaping Use: Never used  Substance and Sexual Activity   Alcohol use: No   Drug use: No   Sexual activity: Not Currently  Other Topics Concern   Not on file  Social History Narrative   Lives alone   Social Determinants of Health   Financial Resource Strain: Not on file  Food Insecurity: No Food Insecurity (10/19/2021)   Hunger Vital Sign    Worried About Running Out of Food in the Last Year: Never true    [place] in the Last Year: Never  true  Transportation Needs: No Transportation Needs (10/19/2021)   PRAPARE - Hydrologist (Medical): No    Lack of Transportation (Non-Medical): No  Physical Activity: Not on file  Stress: Not on file  Social Connections: Not on file     Review of Systems: A 12 point ROS discussed and pertinent positives are indicated in the HPI above.  All other systems are negative.  Review of Systems  Constitutional:  Negative for chills and fever.  Respiratory:  Positive for cough. Negative for chest tightness and shortness of breath.   Cardiovascular:  Negative  for chest pain and leg swelling.  Gastrointestinal:  Negative for abdominal pain, diarrhea, nausea and vomiting.  Musculoskeletal:  Positive for back pain.  Neurological:  Negative for dizziness, light-headedness and headaches.  Psychiatric/Behavioral:  Negative for confusion.     Vital Signs: BP (!) 135/44 (BP Location: Left Arm)   Pulse 67   Temp 98.6 F (37 C)   Resp 20   Wt 170 lb (77.1 kg)   SpO2 96%   BMI 25.10 kg/m   Physical Exam Vitals reviewed.  Constitutional:      General: He is not in acute distress. HENT:     Mouth/Throat:     Mouth: Mucous membranes are moist.  Cardiovascular:     Rate and Rhythm: Normal rate and regular rhythm.     Pulses: Normal pulses.     Heart sounds: Normal heart sounds.  Pulmonary:     Effort: Pulmonary effort is normal.     Breath sounds: Normal breath sounds.  Abdominal:     General: Bowel sounds are normal.     Palpations: Abdomen is soft.     Tenderness: There is no abdominal tenderness.  Musculoskeletal:     Right lower leg: No edema.     Left lower leg: No edema.  Skin:    General: Skin is warm and dry.  Neurological:     Mental Status: He is alert and oriented to person, place, and time.  Psychiatric:        Mood and Affect: Mood normal.        Behavior: Behavior normal.        Thought Content: Thought content normal.        Judgment:  Judgment normal.     Imaging: CT ABDOMEN PELVIS WO CONTRAST  Addendum Date: 10/19/2021   ADDENDUM REPORT: 10/19/2021 12:23 ADDENDUM: These results were called by telephone at the time of interpretation on 10/19/2021 at 12:23 pm to provider [facility] , who verbally acknowledged these results. Electronically Signed   By: Zetta Bills M.D.   On: 10/19/2021 12:23   Result Date: 10/19/2021 CLINICAL DATA:  Urothelial carcinoma post LEFT nephrectomy. Post recent TURBT. * Tracking Code: BO * EXAM: CT ABDOMEN AND PELVIS WITHOUT CONTRAST TECHNIQUE: Multidetector CT imaging of the abdomen and pelvis was performed following the standard protocol without IV contrast. RADIATION DOSE REDUCTION: This exam was performed according to the departmental dose-optimization program which includes automated exposure control, adjustment of the mA and/or kV according to patient size and/or use of iterative reconstruction technique. COMPARISON:  January 25, 2021. FINDINGS: Lower chest: Trace LEFT and small RIGHT-sided pleural fluid. Basilar scarring in the lingula and at the LEFT lung base. Cardiac pacer leads in the RIGHT heart. Heart is incompletely imaged. No pericardial effusion. No chest wall abnormality. Hepatobiliary: Lobulated area in the medial segment of the LEFT hepatic lobe (image 20/2) slightly more dense than surrounding liver 3.1 x 2.2 cm, unchanged dating back to November of 2012 and as far back as May of 2015, unchanged in size compared to the May of 2015 study but with increased density, shown to represent a cyst at the time of the 2015 evaluation. No pericholecystic stranding. No biliary duct dilation. Pancreas: Pancreas with normal contours, no signs of inflammation or peripancreatic fluid. Spleen: Normal size and contour, spleen has fall in into the LEFT nephrectomy bed. Adrenals/Urinary Tract: RIGHT adrenal gland is normal. Status post LEFT nephrectomy. LEFT adrenal gland with small LEFT adrenal nodule  which is unchanged over  a series of prior studies measuring 10 Hounsfield units. Renal cortical scarring on the RIGHT which is moderate. Mildly patulous appearance of the RIGHT ureter without hydronephrosis and with partial reconstitution of the proximal loop of the RIGHT nephroureteral stent and full reconstitution of the distal loop. Proximal loop in anterior interpolar calices and distal loops well reconstituted in the urinary bladder. No signs of perivesical stranding. Small amount of gas in the urinary bladder. Stomach/Bowel: No acute gastrointestinal findings. Herniation of the mid transverse colon into the wide mouth ventral hernia and rectus diastasis in the mid abdomen (image 48/2) 6 cm defect in the abdominal wall. No acute gastric or small bowel process. Vascular/Lymphatic: 3 cm x 3 cm infrarenal abdominal aortic aneurysm in the setting of moderate to marked calcified aortic atherosclerosis. Vascular structures not well assessed due to lack of intravenous contrast. No adenopathy in the retroperitoneum. No adenopathy in the pelvis. Reproductive: Unremarkable by CT. Other: Mild stranding in the presacral space and along the RIGHT pelvic sidewall and RIGHT retroperitoneum. Mild RIGHT perinephric stranding. No pneumoperitoneum. No ascites. Musculoskeletal: Compression fracture at L4. This is new compared to imaging from January 25, 2021 and shows 60-70% loss of height. Mild retropulsion of posterior cortical elements along the superior endplate. Fracture involves superior endplate of L4. Alignment is unchanged and there are signs of cement augmentation at L2. Segmentation referenced above assumes 5 lumbar type vertebral bodies with L2 as the level previously treated with cement augmentation with transitional vertebral anatomy at S1. IMPRESSION: 1. Status post LEFT nephrectomy. 2. Post nephroureteral stenting with stranding in the retroperitoneum and pelvis likely related to recent TURBT and ureteral  manipulation. 3. Small amount of gas in the urinary bladder related to above procedure. 4. Compression fracture at "L4" is new compared to imaging from January 25, 2021 and shows 60-70% loss of height. Mild retropulsion of posterior cortical elements along the superior endplate. Segmentation assumes S1 is a transitional vertebra as outlined on previous CT of the lumbar spine from January 2023. 5. Trace LEFT and small RIGHT-sided pleural fluid. 6. Herniation of the mid transverse colon into the wide mouth ventral hernia and rectus diastasis in the mid abdomen. 6 cm defect in the abdominal wall. 7. 3 cm x 3 cm infrarenal abdominal aortic aneurysm in the setting of moderate to marked calcified aortic atherosclerosis. Recommend follow-up every 3 years. 8. Stable hepatic lesion in the medial segment of the LEFT hepatic lobe in terms of size dating back to 2015 likely reflecting a hemorrhagic cyst, internal hemorrhage or complexity developing over time. Aortic Atherosclerosis (ICD10-I70.0). These results will be called to the ordering clinician or representative by the Radiologist Assistant, and communication documented in the PACS or [employer]. Electronically Signed: By: Zetta Bills M.D. On: 10/19/2021 11:43   DG Lumbar Spine Complete  Result Date: 10/18/2021 CLINICAL DATA:  Low back pain. History of lumbar fracture with vertebroplasty EXAM: LUMBAR SPINE - COMPLETE 4+ VIEW COMPARISON:  CT lumbar spine 01/18/2021 FINDINGS: Moderate to severe compression fracture of L1 with kyphoplasty cement in good position. Mild to moderate compression fracture of L3 is new since the prior CT and may be acute or subacute. Mild anterolisthesis at L4-5 with disc degeneration and spurring. Right ureteral stent.  No ureteral calculus identified IMPRESSION: 1. Mild to moderate compression fracture of L3, new since the prior CT and may be acute or subacute. 2. Chronic compression fracture L1 with kyphoplasty cement.  Electronically Signed   By: Franchot Gallo M.D.  On: 10/18/2021 17:38   DG Thoracic Spine 2 View  Result Date: 10/18/2021 CLINICAL DATA:  Back pain EXAM: THORACIC SPINE 2 VIEWS COMPARISON:  Chest CT 03/18/2019 FINDINGS: Thoracic alignment within normal limits. Minimal chronic wedging at a proximate T8 level. Remaining vertebra demonstrate normal stature. Degenerative osteophytes. Post augmentation changes at L2. IMPRESSION: Degenerative changes.  No acute osseous abnormality Electronically Signed   By: Donavan Foil M.D.   On: 10/18/2021 17:37   DG OR UROLOGY CYSTO IMAGE ([facility] ONLY)  Result Date: 10/17/2021 There is no interpretation for this exam.  This order is for images obtained during a surgical procedure.  Please See "Surgeries" Tab for more information regarding the procedure.   CUP PACEART INCLINIC DEVICE CHECK  Result Date: 09/27/2021 Pacemaker check in clinic. Normal device function. Thresholds, sensing, impedances consistent with previous measurements. Device programmed to maximize longevity. Brief high ventricular rates noted-see attachment.  Counters last cleared 05/2020. Device programmed at appropriate safety margins. Histogram distribution appropriate for patient activity level. Device programmed to optimize intrinsic conduction. Estimated longevity 3.5 years. Patient enrolled in remote follow-up. Patient education completed.Myrtie Hawk, BSN, RN   Labs:  CBC: Recent Labs    10/12/21 1426 10/18/21 1951 10/19/21 0026 10/20/21 0812  WBC 6.0 8.3 7.1 6.0  HGB 10.0* 9.6* 8.8* 8.9*  HCT 31.1* 30.6* 28.0* 28.8*  PLT 188 155 151 155    COAGS: Recent Labs    01/31/21 0448  INR 1.1    BMP: Recent Labs    10/18/21 1951 10/18/21 2302 10/20/21 0815 10/21/21 1030  NA 131* 135 135 137  K 5.4* 5.4* 5.4* 5.1  CL 101 108 109 109  CO2 20* 21* 18* 20*  GLUCOSE 110* 107* 97 139*  BUN 59* 63* 61* 62*  CALCIUM 8.8* 9.1 8.6* 8.5*  CREATININE 3.59* 3.66* 2.84* 2.18*   GFRNONAA 15* 15* 20* 28*    LIVER FUNCTION TESTS: Recent Labs    01/25/21 1614 10/20/21 0815  BILITOT 0.8  --   AST 30  --   ALT 21  --   ALKPHOS 70  --   PROT 7.4  --   ALBUMIN 3.7 2.9*    TUMOR MARKERS: No results for input(s): "AFPTM", "CEA", "CA199", "CHROMGRNA" in the last 8760 hours.  Assessment and Plan:  Jacob [name] is a 62 male with complex PMH being seen today in evaluation for L4 kyphoplasty. The case has been reviewed and approved by Dr Denna Haggard. Insurance authorization for the procedure was received on 10/21/21.   Risks and benefits of L4 image-guided kyphoplasty were discussed with the patient including, but not limited to education regarding the natural healing process of compression fractures without intervention, bleeding, infection, cement migration which may cause spinal cord damage, paralysis, pulmonary embolism or even death.  This interventional procedure involves the use of X-rays and because of the nature of the planned procedure, it is possible that we will have prolonged use of X-ray fluoroscopy.  Potential radiation risks to you include (but are not limited to) the following: - A slightly elevated risk for cancer  several years later in life. This risk is typically less than 0.5% percent. This risk is low in comparison to the normal incidence of human cancer, which is 33% for women and 50% for men according to the [employer]. - Radiation induced injury can include skin redness, resembling a rash, tissue breakdown / ulcers and hair loss (which can be temporary or permanent).   The likelihood of either of  these occurring depends on the difficulty of the procedure and whether you are sensitive to radiation due to previous procedures, disease, or genetic conditions.   IF your procedure requires a prolonged use of radiation, you will be notified and given written instructions for further action.  It is your responsibility to monitor the  irradiated area for the 2 weeks following the procedure and to notify your physician if you are concerned that you have suffered a radiation induced injury.    All of the patient's questions were answered, patient is agreeable to proceed.  Consent signed and in chart.   Thank you for this interesting consult.  I greatly enjoyed meeting Jacob [name] and look forward to participating in their care.  A copy of this report was sent to the requesting provider on this date.  Electronically Signed: Lura Em, PA-C 10/21/2021, 4:59 PM   I spent a total of 40 Minutes  in face to face in clinical consultation, greater than 50% of which was counseling/coordinating care for image-guided L4 kyphoplasty.

## 2021-10-22 DIAGNOSIS — R52 Pain, unspecified: Secondary | ICD-10-CM | POA: Diagnosis not present

## 2021-10-22 MED ORDER — DICLOFENAC SODIUM 1 % EX GEL
4.0000 g | Freq: Four times a day (QID) | CUTANEOUS | Status: DC
  Administered 2021-10-22 – 2021-10-28 (×20): 4 g via TOPICAL
  Filled 2021-10-22: qty 100

## 2021-10-22 NOTE — Plan of Care (Signed)
  Problem: Education: Goal: Knowledge of General Education information will improve Description: Including pain rating scale, medication(s)/side effects and non-pharmacologic comfort measures Outcome: Progressing   Problem: Health Behavior/Discharge Planning: Goal: Ability to manage health-related needs will improve Outcome: Progressing   Problem: Clinical Measurements: Goal: Respiratory complications will improve Outcome: Progressing   Problem: Clinical Measurements: Goal: Cardiovascular complication will be avoided Outcome: Progressing   Problem: Nutrition: Goal: Adequate nutrition will be maintained Outcome: Progressing   Problem: Coping: Goal: Level of anxiety will decrease Outcome: Progressing   Problem: Pain Managment: Goal: General experience of comfort will improve Outcome: Progressing   Problem: Safety: Goal: Ability to remain free from injury will improve Outcome: Progressing   Problem: Skin Integrity: Goal: Risk for impaired skin integrity will decrease Outcome: Progressing   

## 2021-10-22 NOTE — Progress Notes (Signed)
0 PROGRESS NOTE    Jacob [name]  TIR:[id number] DOB: 1928-05-31 DOA: 10/18/2021 PCP: [name], MD    Brief Narrative:  88 year old male with history of L1 compression fracture, BPH, bladder tumor, status post TURPT, urocystoscopy, ureteral tumor biopsy, atrial fibrillation, hypertension, GERD, history of diverticulosis with diverticulitis, renal cell carcinoma status post left nephroureterectomy in August 2015, hyperlipidemia, Mobitz type II AV block, status postcardiac pacemaker placement in 2015, who presents emergency department for chief concerns of low back pain.  Pursued CT abdomen pelvis given concern for possible lytic lesions of metastatic disease.  Study limited by lack of IV contrast however no metastatic disease noted.  There was L4 compression fracture with 60 to 70% height loss.  Also with AKI on CKD stage IV.  Nephrology consult requested. Interventional radiology has approved patient for kyphoplasty.  Insurance approval obtained.  Plan for procedure Monday 10/24    Assessment & Plan:   Principal Problem:   Inadequate pain control Active Problems:   Hyperlipidemia   Left bundle branch block   Urothelial carcinoma of kidney (HCC)   HTN (hypertension)   Renal cancer, left (HCC)   Mobitz type 2 second degree AV block   COPD (chronic obstructive pulmonary disease) (HCC)   GERD (gastroesophageal reflux disease)   Stage 3a chronic kidney disease (CKD) (HCC)   Type 2 diabetes mellitus without complication, without long-term current use of insulin (HCC)   Closed compression fracture of L3 lumbar vertebra, initial encounter (HCC)   Hyperkalemia   AKI (acute kidney injury) ([place])   Heart failure with preserved ejection fraction (HCC)   Dysuria   Lumbar radiculopathy  Intractable back pain L4 compression fracture Patient has had poor tolerance of p.o. narcotic regimen.  States it constipates him and does not control his pain.  TLSO brace counseled.  Patient  states this is uncomfortable. -IR approved patient for kyphoplasty -Neurosurgery recommends LSO brace when upright.  Does not recommend surgical intervention at this time Plan: continue multimodal pain regimen and bowel regimen TLSO brace when upright Kyphoplasty Monday 10/24  AKI on CKD stage IV Patient was prescribed Bactrim prior to cystoscopy.  This has been held.  He is established with Dr. Murlean Iba for outpatient nephrology. Procalcitonin 0.24 Plan: DC IVF  Dysuria Unclear etiology.  Unable to exclude urinary tract infection at this time.  Could be secondary to recent bladder instrumentation Procalcitonin 0.24 Plan: Continue IV Rocephin Plan on 5-day stop date  GERD PPI  Hypertension PTA Norvasc  Hyperlipidemia PTA Lipitor   DVT prophylaxis: SQ heparin Code Status: Full Family Communication: Tahmid Stonehocker [phone] on 10/19, left VM on 10/21 Disposition Plan: Status is: Inpatient Remains inpatient appropriate because: Intractable back pain secondary to L4 compression fracture.  Will require kyphoplasty.  Procedure planned for Monday 10/24    Level of care: Med-Surg  Consultants:  IR Nephrology  Procedures:  None  Antimicrobials: Ceftriaxone   Subjective: Seen and examined.  Still in pain.  Limited response to medications  Objective: Vitals:   10/21/21 0832 10/21/21 2023 10/22/21 0053 10/22/21 0818  BP: (!) 135/44 (!) 122/58 (!) 114/38 (!) 140/45  Pulse: 67 74 61 62  Resp: 20 18 18 16   Temp: 98.6 F (37 C) 98.3 F (36.8 C) 97.7 F (36.5 C) 98.2 F (36.8 C)  TempSrc:      SpO2: 96% 91% 93% 93%  Weight:        Intake/Output Summary (Last 24 hours) at 10/22/2021 1302 Last data filed at 10/22/2021  1131 Gross per 24 hour  Intake 275.84 ml  Output 2300 ml  Net -2024.16 ml   Filed Weights   10/18/21 1647  Weight: 77.1 kg    Examination:  General exam: No acute distress Respiratory system: Clear.  Normal work of breathing.  Room  air Cardiovascular system: S1-2, RRR, no murmurs, no pedal edema Gastrointestinal system: Soft, NT/ND, normal bowel sounds Central nervous system: Alert and oriented. No focal neurological deficits. Extremities: Decreased strength bilateral lower extremities.  Decreased ROM secondary to pain Skin: No rashes, lesions or ulcers Psychiatry: Judgement and insight appear normal. Mood & affect appropriate.     Data Reviewed: I have personally reviewed following labs and imaging studies  CBC: Recent Labs  Lab 10/18/21 1951 10/19/21 0026 10/20/21 0812  WBC 8.3 7.1 6.0  NEUTROABS  --   --  4.7  HGB 9.6* 8.8* 8.9*  HCT 30.6* 28.0* 28.8*  MCV 96.8 95.6 98.3  PLT 155 151 952   Basic Metabolic Panel: Recent Labs  Lab 10/18/21 1951 10/18/21 2302 10/20/21 0815 10/21/21 1030  NA 131* 135 135 137  K 5.4* 5.4* 5.4* 5.1  CL 101 108 109 109  CO2 20* 21* 18* 20*  GLUCOSE 110* 107* 97 139*  BUN 59* 63* 61* 62*  CREATININE 3.59* 3.66* 2.84* 2.18*  CALCIUM 8.8* 9.1 8.6* 8.5*  MG  --  2.4  --   --   PHOS  --  6.0* 4.2  --    GFR: Estimated Creatinine Clearance: 22.1 mL/min (A) (by C-G formula based on SCr of 2.18 mg/dL (H)). Liver Function Tests: Recent Labs  Lab 10/20/21 0815  ALBUMIN 2.9*   No results for input(s): "LIPASE", "AMYLASE" in the last 168 hours. No results for input(s): "AMMONIA" in the last 168 hours. Coagulation Profile: No results for input(s): "INR", "PROTIME" in the last 168 hours. Cardiac Enzymes: No results for input(s): "CKTOTAL", "CKMB", "CKMBINDEX", "TROPONINI" in the last 168 hours. BNP (last 3 results) No results for input(s): "PROBNP" in the last 8760 hours. HbA1C: No results for input(s): "HGBA1C" in the last 72 hours. CBG: Recent Labs  Lab 10/21/21 0744  GLUCAP 125*   Lipid Profile: No results for input(s): "CHOL", "HDL", "LDLCALC", "TRIG", "CHOLHDL", "LDLDIRECT" in the last 72 hours. Thyroid Function Tests: No results for input(s): "TSH",  "T4TOTAL", "FREET4", "T3FREE", "THYROIDAB" in the last 72 hours. Anemia Panel: No results for input(s): "VITAMINB12", "FOLATE", "FERRITIN", "TIBC", "IRON", "RETICCTPCT" in the last 72 hours. Sepsis Labs: Recent Labs  Lab 10/19/21 0026  PROCALCITON 0.24    No results found for this or any previous visit (from the past 240 hour(s)).       Radiology Studies: No results found.      Scheduled Meds:  amLODipine  5 mg Oral Daily   atorvastatin  40 mg Oral Daily   diclofenac Sodium  4 g Topical QID   fluticasone furoate-vilanterol  1 puff Inhalation Daily   And   umeclidinium bromide  1 puff Inhalation Daily   gabapentin  300 mg Oral BID   heparin  5,000 Units Subcutaneous Q8H   polyethylene glycol  17 g Oral Daily   senna-docusate  1 tablet Oral BID   Continuous Infusions:  cefTRIAXone (ROCEPHIN)  IV 1 g (10/21/21 2100)   [START ON 10/24/2021] vancomycin       LOS: 2 days      Sidney Ace, MD [facility]   If 7PM-7AM, please contact night-coverage  10/22/2021, 1:02 PM

## 2021-10-22 NOTE — Progress Notes (Signed)
[facility]  PROGRESS NOTE   Subjective:   Patient seen at bedside.  Eating lunch.  Feels much better.  Urine output is excellent.  Objective:  Vital signs: Blood pressure (!) 140/45, pulse 62, temperature 98.2 F (36.8 C), resp. rate 16, weight 77.1 kg, SpO2 93 %.  Intake/Output Summary (Last 24 hours) at 10/22/2021 1446 Last data filed at 10/22/2021 1131 Gross per 24 hour  Intake 275.84 ml  Output 2300 ml  Net -2024.16 ml   Filed Weights   10/18/21 1647  Weight: 77.1 kg     Physical Exam: General:  No acute distress  Head:  Normocephalic, atraumatic. Moist oral mucosal membranes  Eyes:  Anicteric  Neck:  Supple  Lungs:   Clear to auscultation, normal effort  Heart:  S1S2 no rubs  Abdomen:   Soft, nontender, bowel sounds present  Extremities:  peripheral edema.  Neurologic:  Awake, alert, following commands  Skin:  No lesions  Access:     Basic Metabolic Panel: Recent Labs  Lab 10/18/21 1951 10/18/21 2302 10/20/21 0815 10/21/21 1030  NA 131* 135 135 137  K 5.4* 5.4* 5.4* 5.1  CL 101 108 109 109  CO2 20* 21* 18* 20*  GLUCOSE 110* 107* 97 139*  BUN 59* 63* 61* 62*  CREATININE 3.59* 3.66* 2.84* 2.18*  CALCIUM 8.8* 9.1 8.6* 8.5*  MG  --  2.4  --   --   PHOS  --  6.0* 4.2  --     CBC: Recent Labs  Lab 10/18/21 1951 10/19/21 0026 10/20/21 0812  WBC 8.3 7.1 6.0  NEUTROABS  --   --  4.7  HGB 9.6* 8.8* 8.9*  HCT 30.6* 28.0* 28.8*  MCV 96.8 95.6 98.3  PLT 155 151 155     Urinalysis: No results for input(s): "COLORURINE", "LABSPEC", "PHURINE", "GLUCOSEU", "HGBUR", "BILIRUBINUR", "KETONESUR", "PROTEINUR", "UROBILINOGEN", "NITRITE", "LEUKOCYTESUR" in the last 72 hours.  Invalid input(s): "APPERANCEUR"    Imaging: No results found.   Medications:    cefTRIAXone (ROCEPHIN)  IV 1 g (10/21/21 2100)   [START ON 10/24/2021] vancomycin      amLODipine  5 mg Oral Daily   atorvastatin  40 mg Oral Daily   diclofenac Sodium  4 g Topical  QID   fluticasone furoate-vilanterol  1 puff Inhalation Daily   And   umeclidinium bromide  1 puff Inhalation Daily   gabapentin  300 mg Oral BID   heparin  5,000 Units Subcutaneous Q8H   polyethylene glycol  17 g Oral Daily   senna-docusate  1 tablet Oral BID    Assessment/ Plan:     Principal Problem:   Inadequate pain control Active Problems:   Hyperlipidemia   Left bundle branch block   Urothelial carcinoma of kidney (HCC)   HTN (hypertension)   Renal cancer, left (HCC)   Mobitz type 2 second degree AV block   COPD (chronic obstructive pulmonary disease) (HCC)   GERD (gastroesophageal reflux disease)   Stage 3a chronic kidney disease (CKD) (HCC)   Type 2 diabetes mellitus without complication, without long-term current use of insulin (HCC)   Closed compression fracture of L3 lumbar vertebra, initial encounter (HCC)   Hyperkalemia   AKI (acute kidney injury) ([place])   Heart failure with preserved ejection fraction ([place])   Dysuria   Lumbar radiculopathy  Jacob [name] is a 88 y.o. male with past medical history including past medical history includes atrial fibrillation, GERD, hypertension, BPH, bladder tumor status post TURPT, urethral  tumor biopsy, urocystoscopy and  chronic kidney disease stage IV.  Patient presents to the emergency department with complaints of back pain and has been admitted under observation for Hyperkalemia [E87.5] Acute kidney insufficiency [N28.9] Inadequate pain control [R52] Severe back pain [M54.9] Closed compression fracture of L3 lumbar vertebra, initial encounter ([place]) [S32.030A] Constipation, unspecified constipation type [K59.00] Lumbar radiculopathy [M54.16]  #1: Acute kidney injury: Patient was admitted with acute kidney injury with hyperkalemia.  S/p left nephrectomy. Patient has been on Bactrim as outpatient which was discontinued.  #2: Anemia: Most likely secondary to chronic kidney disease.  Can start oral iron tablets.  #3:  Hypertension: Blood pressure is better today.  #4: L4 compression fracture: Patient is scheduled for kyphoplasty on Monday.  Continue pain management.  We will continue to monitor closely.     LOS: [place], MD [facility] 10/21/20232:46 PM

## 2021-10-23 DIAGNOSIS — R52 Pain, unspecified: Secondary | ICD-10-CM | POA: Diagnosis not present

## 2021-10-23 NOTE — Progress Notes (Signed)
[facility]  PROGRESS NOTE   Subjective:   Seen at bedside.  Still complains of back pain.  Scheduled for surgery tomorrow.  Objective:  Vital signs: Blood pressure (!) 141/47, pulse (!) 59, temperature 97.9 F (36.6 C), resp. rate 16, weight 77.1 kg, SpO2 98 %.  Intake/Output Summary (Last 24 hours) at 10/23/2021 1122 Last data filed at 10/23/2021 1043 Gross per 24 hour  Intake 440 ml  Output 1450 ml  Net -1010 ml   Filed Weights   10/18/21 1647  Weight: 77.1 kg     Physical Exam: General:  No acute distress  Head:  Normocephalic, atraumatic. Moist oral mucosal membranes  Eyes:  Anicteric  Neck:  Supple  Lungs:   Clear to auscultation, normal effort  Heart:  S1S2 no rubs  Abdomen:   Soft, nontender, bowel sounds present  Extremities:  peripheral edema.  Neurologic:  Awake, alert, following commands  Skin:  No lesions  Access:     Basic Metabolic Panel: Recent Labs  Lab 10/18/21 1951 10/18/21 2302 10/20/21 0815 10/21/21 1030  NA 131* 135 135 137  K 5.4* 5.4* 5.4* 5.1  CL 101 108 109 109  CO2 20* 21* 18* 20*  GLUCOSE 110* 107* 97 139*  BUN 59* 63* 61* 62*  CREATININE 3.59* 3.66* 2.84* 2.18*  CALCIUM 8.8* 9.1 8.6* 8.5*  MG  --  2.4  --   --   PHOS  --  6.0* 4.2  --     CBC: Recent Labs  Lab 10/18/21 1951 10/19/21 0026 10/20/21 0812  WBC 8.3 7.1 6.0  NEUTROABS  --   --  4.7  HGB 9.6* 8.8* 8.9*  HCT 30.6* 28.0* 28.8*  MCV 96.8 95.6 98.3  PLT 155 151 155     Urinalysis: No results for input(s): "COLORURINE", "LABSPEC", "PHURINE", "GLUCOSEU", "HGBUR", "BILIRUBINUR", "KETONESUR", "PROTEINUR", "UROBILINOGEN", "NITRITE", "LEUKOCYTESUR" in the last 72 hours.  Invalid input(s): "APPERANCEUR"    Imaging: No results found.   Medications:    [START ON 10/24/2021] vancomycin      amLODipine  5 mg Oral Daily   atorvastatin  40 mg Oral Daily   diclofenac Sodium  4 g Topical QID   fluticasone furoate-vilanterol  1 puff Inhalation  Daily   And   umeclidinium bromide  1 puff Inhalation Daily   gabapentin  300 mg Oral BID   heparin  5,000 Units Subcutaneous Q8H   polyethylene glycol  17 g Oral Daily   senna-docusate  1 tablet Oral BID    Assessment/ Plan:     Principal Problem:   Inadequate pain control Active Problems:   Hyperlipidemia   Left bundle branch block   Urothelial carcinoma of kidney (HCC)   HTN (hypertension)   Renal cancer, left (HCC)   Mobitz type 2 second degree AV block   COPD (chronic obstructive pulmonary disease) (HCC)   GERD (gastroesophageal reflux disease)   Stage 3a chronic kidney disease (CKD) (HCC)   Type 2 diabetes mellitus without complication, without long-term current use of insulin (HCC)   Closed compression fracture of L3 lumbar vertebra, initial encounter (HCC)   Hyperkalemia   AKI (acute kidney injury) ([place])   Heart failure with preserved ejection fraction ([place])   Dysuria   Lumbar radiculopathy  Jacob [name] is a 88 y.o. male with past medical history including past medical history includes atrial fibrillation, GERD, hypertension, BPH, bladder tumor status post TURPT, urethral tumor biopsy, urocystoscopy and  chronic kidney disease stage IV.  Patient presents to the emergency department with complaints of back pain and has been admitted under observation for Hyperkalemia [E87.5] Acute kidney insufficiency [N28.9] Inadequate pain control [R52] Severe back pain [M54.9] Closed compression fracture of L3 lumbar vertebra, initial encounter ([place]) [S32.030A] Constipation, unspecified constipation type [K59.00] Lumbar radiculopathy [M54.16]   #1: Acute kidney injury: Patient was admitted with acute kidney injury with hyperkalemia.  S/p left nephrectomy. Patient has been on Bactrim as outpatient which was discontinued.   #2: Anemia: Most likely secondary to chronic kidney disease.  Can start oral iron tablets.   #3: Hypertension: Blood pressure is better today.   #4: L4  compression fracture: Patient is scheduled for kyphoplasty on Monday.  Continue pain management.   We will continue to monitor closely.   LOS: [place], MD [facility] 10/22/202311:22 AM

## 2021-10-23 NOTE — Progress Notes (Signed)
0 PROGRESS NOTE    Jacob [name]  HQI:[id number] DOB: [birth year] DOA: 10/18/2021 PCP: [name], MD    Brief Narrative:  88 year old male with history of L1 compression fracture, BPH, bladder tumor, status post TURPT, urocystoscopy, ureteral tumor biopsy, atrial fibrillation, hypertension, GERD, history of diverticulosis with diverticulitis, renal cell carcinoma status post left nephroureterectomy in August 2015, hyperlipidemia, Mobitz type II AV block, status postcardiac pacemaker placement in 2015, who presents emergency department for chief concerns of low back pain.  Pursued CT abdomen pelvis given concern for possible lytic lesions of metastatic disease.  Study limited by lack of IV contrast however no metastatic disease noted.  There was L4 compression fracture with 60 to 70% height loss.  Also with AKI on CKD stage IV.  Nephrology consult requested. Interventional radiology has approved patient for kyphoplasty.  Insurance approval obtained.  Plan for procedure Monday 10/24    Assessment & Plan:   Principal Problem:   Inadequate pain control Active Problems:   Hyperlipidemia   Left bundle branch block   Urothelial carcinoma of kidney (HCC)   HTN (hypertension)   Renal cancer, left (HCC)   Mobitz type 2 second degree AV block   COPD (chronic obstructive pulmonary disease) (HCC)   GERD (gastroesophageal reflux disease)   Stage 3a chronic kidney disease (CKD) (HCC)   Type 2 diabetes mellitus without complication, without long-term current use of insulin (HCC)   Closed compression fracture of L3 lumbar vertebra, initial encounter (HCC)   Hyperkalemia   AKI (acute kidney injury) ([place])   Heart failure with preserved ejection fraction (HCC)   Dysuria   Lumbar radiculopathy  Intractable back pain L4 compression fracture Patient has had poor tolerance of p.o. narcotic regimen.  States it constipates him and does not control his pain.  TLSO brace counseled.  Patient  states this is uncomfortable. -IR approved patient for kyphoplasty -Neurosurgery recommends LSO brace when upright.  Does not recommend surgical intervention at this time -Pain control has been challenging Plan: continue multimodal pain regimen and bowel regimen TLSO brace when upright Plan for kyphoplasty tomorrow 10/23 N.p.o. for 12  AKI on CKD stage IV Patient was prescribed Bactrim prior to cystoscopy.  This has been held.  He is established with Dr. Murlean Iba for outpatient nephrology. Procalcitonin 0.24 Plan: DC IVF  Dysuria Unclear etiology.  Unable to exclude urinary tract infection at this time.  Could be secondary to recent bladder instrumentation Procalcitonin 0.24 Plan: Continue IV Rocephin 5-day stop date in place  GERD PPI  Hypertension PTA Norvasc  Hyperlipidemia PTA Lipitor   DVT prophylaxis: SQ heparin Code Status: Full Family Communication: Mitchell Iwanicki [phone] on 10/19, left VM on 10/21 Disposition Plan: Status is: Inpatient Remains inpatient appropriate because: Intractable back pain secondary to L4 compression fracture.  Will require kyphoplasty.  Procedure planned for Monday 10/23    Level of care: Med-Surg  Consultants:  IR Nephrology  Procedures:  None  Antimicrobials: Ceftriaxone   Subjective: Seen and examined.  Still in pain.  Limited response to medications  Objective: Vitals:   10/22/21 1539 10/22/21 2305 10/23/21 0811 10/23/21 1256  BP: (!) 138/55 (!) 125/50 (!) 141/47 (!) 140/47  Pulse: 65 61 (!) 59 61  Resp: 16 20 16 16   Temp: 98.6 F (37 C) 98.7 F (37.1 C) 97.9 F (36.6 C) 98.8 F (37.1 C)  TempSrc:  Oral    SpO2: 94% 92% 98% 93%  Weight:        Intake/Output Summary (  Last 24 hours) at 10/23/2021 1344 Last data filed at 10/23/2021 1043 Gross per 24 hour  Intake 440 ml  Output 1100 ml  Net -660 ml   Filed Weights   10/18/21 1647  Weight: 77.1 kg    Examination:  General exam: No acute  distress Respiratory system: Clear.  Normal work of breathing.  Room air Cardiovascular system: S1-2, RRR, no murmurs, no pedal edema Gastrointestinal system: Soft, NT/ND, normal bowel sounds Central nervous system: Alert and oriented. No focal neurological deficits. Extremities: Decreased strength BLE.  Decreased ROM Skin: No rashes, lesions or ulcers Psychiatry: Judgement and insight appear normal. Mood & affect appropriate.     Data Reviewed: I have personally reviewed following labs and imaging studies  CBC: Recent Labs  Lab 10/18/21 1951 10/19/21 0026 10/20/21 0812  WBC 8.3 7.1 6.0  NEUTROABS  --   --  4.7  HGB 9.6* 8.8* 8.9*  HCT 30.6* 28.0* 28.8*  MCV 96.8 95.6 98.3  PLT 155 151 678   Basic Metabolic Panel: Recent Labs  Lab 10/18/21 1951 10/18/21 2302 10/20/21 0815 10/21/21 1030  NA 131* 135 135 137  K 5.4* 5.4* 5.4* 5.1  CL 101 108 109 109  CO2 20* 21* 18* 20*  GLUCOSE 110* 107* 97 139*  BUN 59* 63* 61* 62*  CREATININE 3.59* 3.66* 2.84* 2.18*  CALCIUM 8.8* 9.1 8.6* 8.5*  MG  --  2.4  --   --   PHOS  --  6.0* 4.2  --    GFR: Estimated Creatinine Clearance: 22.1 mL/min (A) (by C-G formula based on SCr of 2.18 mg/dL (H)). Liver Function Tests: Recent Labs  Lab 10/20/21 0815  ALBUMIN 2.9*   No results for input(s): "LIPASE", "AMYLASE" in the last 168 hours. No results for input(s): "AMMONIA" in the last 168 hours. Coagulation Profile: No results for input(s): "INR", "PROTIME" in the last 168 hours. Cardiac Enzymes: No results for input(s): "CKTOTAL", "CKMB", "CKMBINDEX", "TROPONINI" in the last 168 hours. BNP (last 3 results) No results for input(s): "PROBNP" in the last 8760 hours. HbA1C: No results for input(s): "HGBA1C" in the last 72 hours. CBG: Recent Labs  Lab 10/21/21 0744  GLUCAP 125*   Lipid Profile: No results for input(s): "CHOL", "HDL", "LDLCALC", "TRIG", "CHOLHDL", "LDLDIRECT" in the last 72 hours. Thyroid Function Tests: No  results for input(s): "TSH", "T4TOTAL", "FREET4", "T3FREE", "THYROIDAB" in the last 72 hours. Anemia Panel: No results for input(s): "VITAMINB12", "FOLATE", "FERRITIN", "TIBC", "IRON", "RETICCTPCT" in the last 72 hours. Sepsis Labs: Recent Labs  Lab 10/19/21 0026  PROCALCITON 0.24    No results found for this or any previous visit (from the past 240 hour(s)).       Radiology Studies: No results found.      Scheduled Meds:  amLODipine  5 mg Oral Daily   atorvastatin  40 mg Oral Daily   diclofenac Sodium  4 g Topical QID   fluticasone furoate-vilanterol  1 puff Inhalation Daily   And   umeclidinium bromide  1 puff Inhalation Daily   gabapentin  300 mg Oral BID   heparin  5,000 Units Subcutaneous Q8H   polyethylene glycol  17 g Oral Daily   senna-docusate  1 tablet Oral BID   Continuous Infusions:  [START ON 10/24/2021] vancomycin       LOS: 3 days      Sidney Ace, MD [facility]   If 7PM-7AM, please contact night-coverage  10/23/2021, 1:44 PM

## 2021-10-23 NOTE — Progress Notes (Signed)
PT Cancellation Note  Patient Details Name: Jacob [name] MRN: [id number] DOB: 29-Jul-1928   Cancelled Treatment:    Reason Eval/Treat Not Completed: Other (comment) Per chart, pt planned for kyphoplasty tomorrow. Will hold PT tx today.  Lavone Nian, PT, DPT 10/23/21, 8:12 AM  [name] 10/23/2021, 8:12 AM

## 2021-10-24 ENCOUNTER — Inpatient Hospital Stay: Payer: Medicare Other | Admitting: Radiology

## 2021-10-24 DIAGNOSIS — R52 Pain, unspecified: Secondary | ICD-10-CM | POA: Diagnosis not present

## 2021-10-24 HISTORY — PX: IR KYPHO LUMBAR INC FX REDUCE BONE BX UNI/BIL CANNULATION INC/IMAGING: IMG5519

## 2021-10-24 LAB — CBC WITH DIFFERENTIAL/PLATELET
Abs Immature Granulocytes: 0.01 10*3/uL (ref 0.00–0.07)
Basophils Absolute: 0 10*3/uL (ref 0.0–0.1)
Basophils Relative: 1 %
Eosinophils Absolute: 0.1 10*3/uL (ref 0.0–0.5)
Eosinophils Relative: 4 %
HCT: 29.4 % — ABNORMAL LOW (ref 39.0–52.0)
Hemoglobin: 9.5 g/dL — ABNORMAL LOW (ref 13.0–17.0)
Immature Granulocytes: 0 %
Lymphocytes Relative: 28 %
Lymphs Abs: 1 10*3/uL (ref 0.7–4.0)
MCH: 30.7 pg (ref 26.0–34.0)
MCHC: 32.3 g/dL (ref 30.0–36.0)
MCV: 95.1 fL (ref 80.0–100.0)
Monocytes Absolute: 0.4 10*3/uL (ref 0.1–1.0)
Monocytes Relative: 13 %
Neutro Abs: 1.8 10*3/uL (ref 1.7–7.7)
Neutrophils Relative %: 54 %
Platelets: 131 10*3/uL — ABNORMAL LOW (ref 150–400)
RBC: 3.09 MIL/uL — ABNORMAL LOW (ref 4.22–5.81)
RDW: 13.2 % (ref 11.5–15.5)
WBC: 3.4 10*3/uL — ABNORMAL LOW (ref 4.0–10.5)
nRBC: 0 % (ref 0.0–0.2)

## 2021-10-24 LAB — BASIC METABOLIC PANEL
Anion gap: 5 (ref 5–15)
BUN: 46 mg/dL — ABNORMAL HIGH (ref 8–23)
CO2: 23 mmol/L (ref 22–32)
Calcium: 9.1 mg/dL (ref 8.9–10.3)
Chloride: 110 mmol/L (ref 98–111)
Creatinine, Ser: 1.65 mg/dL — ABNORMAL HIGH (ref 0.61–1.24)
GFR, Estimated: 39 mL/min — ABNORMAL LOW (ref >60)
Glucose, Bld: 111 mg/dL — ABNORMAL HIGH (ref 70–99)
Potassium: 4.9 mmol/L (ref 3.5–5.1)
Sodium: 138 mmol/L (ref 135–145)

## 2021-10-24 LAB — PROTIME-INR
INR: 1.1 (ref 0.8–1.2)
Prothrombin Time: 13.9 seconds (ref 11.4–15.2)

## 2021-10-24 MED ORDER — VANCOMYCIN HCL IN DEXTROSE 1-5 GM/200ML-% IV SOLN
INTRAVENOUS | Status: AC
  Filled 2021-10-24: qty 200

## 2021-10-24 MED ORDER — VANCOMYCIN HCL 500 MG/100ML IV SOLN
INTRAVENOUS | Status: AC | PRN
  Administered 2021-10-24: 1000 mg via INTRAVENOUS

## 2021-10-24 MED ORDER — FENTANYL CITRATE (PF) 100 MCG/2ML IJ SOLN
INTRAMUSCULAR | Status: AC | PRN
  Administered 2021-10-24 (×3): 25 ug via INTRAVENOUS

## 2021-10-24 MED ORDER — MIDAZOLAM HCL 2 MG/2ML IJ SOLN
INTRAMUSCULAR | Status: AC | PRN
  Administered 2021-10-24: .5 mg via INTRAVENOUS

## 2021-10-24 MED ORDER — LACTULOSE 10 GM/15ML PO SOLN
20.0000 g | Freq: Two times a day (BID) | ORAL | Status: AC
  Administered 2021-10-24: 20 g via ORAL
  Filled 2021-10-24: qty 30

## 2021-10-24 MED ORDER — MIDAZOLAM HCL 2 MG/2ML IJ SOLN
INTRAMUSCULAR | Status: AC
  Filled 2021-10-24: qty 2

## 2021-10-24 MED ORDER — ACETAMINOPHEN 325 MG PO TABS
650.0000 mg | ORAL_TABLET | Freq: Four times a day (QID) | ORAL | Status: DC | PRN
  Administered 2021-10-26: 650 mg via ORAL
  Filled 2021-10-24: qty 2

## 2021-10-24 MED ORDER — ACETAMINOPHEN 650 MG RE SUPP: 650.0000 mg | Freq: Four times a day (QID) | RECTAL | Status: DC | PRN

## 2021-10-24 MED ORDER — LIDOCAINE HCL (PF) 1 % IJ SOLN
INTRAMUSCULAR | Status: AC
  Filled 2021-10-24: qty 30

## 2021-10-24 MED ORDER — FENTANYL CITRATE (PF) 100 MCG/2ML IJ SOLN
INTRAMUSCULAR | Status: AC
  Filled 2021-10-24: qty 2

## 2021-10-24 MED ORDER — MIDAZOLAM HCL 5 MG/5ML IJ SOLN
INTRAMUSCULAR | Status: AC | PRN
  Administered 2021-10-24: .5 mg via INTRAVENOUS

## 2021-10-24 NOTE — Care Management Important Message (Signed)
Important Message  Patient Details  Name: Jacob [name] MRN: [id number] Date of Birth: [birth year]   Medicare Important Message Given:  Yes  Patient out of room upon time of visit.  Copy of Medicare IM left in room for reference.   Dannette Barbara 10/24/2021, 12:59 PM

## 2021-10-24 NOTE — Procedures (Signed)
Interventional Radiology Procedure Note  Date of Procedure: 10/24/2021  Procedure: IR L4 kyphoplasty   Findings:  1. Success L4 balloon KP    Complications: No immediate complications noted.   Estimated Blood Loss: minimal  Follow-up and Recommendations: 1. Refer to EMR    Albin Felling, MD  Vascular & Interventional Radiology  10/24/2021 1:19 PM

## 2021-10-24 NOTE — Progress Notes (Signed)
0 PROGRESS NOTE    Jacob [name]  JSE:[id number] DOB: 11-23-1928 DOA: 10/18/2021 PCP: [name], MD    Brief Narrative:  88 year old male with history of L1 compression fracture, BPH, bladder tumor, status post TURPT, urocystoscopy, ureteral tumor biopsy, atrial fibrillation, hypertension, GERD, history of diverticulosis with diverticulitis, renal cell carcinoma status post left nephroureterectomy in August 2015, hyperlipidemia, Mobitz type II AV block, status postcardiac pacemaker placement in 2015, who presents emergency department for chief concerns of low back pain.  Pursued CT abdomen pelvis given concern for possible lytic lesions of metastatic disease.  Study limited by lack of IV contrast however no metastatic disease noted.  There was L4 compression fracture with 60 to 70% height loss.  Also with AKI on CKD stage IV.  Nephrology consult requested. Interventional radiology has approved patient for kyphoplasty.  Insurance approval obtained.  Plan for procedure Monday 10/24    Assessment & Plan:   Principal Problem:   Inadequate pain control Active Problems:   Hyperlipidemia   Left bundle branch block   Urothelial carcinoma of kidney (HCC)   HTN (hypertension)   Renal cancer, left (HCC)   Mobitz type 2 second degree AV block   COPD (chronic obstructive pulmonary disease) (HCC)   GERD (gastroesophageal reflux disease)   Stage 3a chronic kidney disease (CKD) (HCC)   Type 2 diabetes mellitus without complication, without long-term current use of insulin (HCC)   Closed compression fracture of L3 lumbar vertebra, initial encounter (HCC)   Hyperkalemia   AKI (acute kidney injury) ([place])   Heart failure with preserved ejection fraction (HCC)   Dysuria   Lumbar radiculopathy  Intractable back pain L4 compression fracture Patient has had poor tolerance of p.o. narcotic regimen.  States it constipates him and does not control his pain.  TLSO brace counseled.  Patient  states this is uncomfortable. -IR approved patient for kyphoplasty -Neurosurgery recommends LSO brace when upright.  Does not recommend surgical intervention at this time -Pain control has been challenging -Poor tolerance of narcotic Plan: continue multimodal pain regimen and bowel regimen TLSO brace when upright Lasix today 10/23  Constipation Likely secondary to narcotic administration.  Immobility also contributing factor. Poor response to previously scheduled bowel regimen : Plan: Continue scheduled bowel regimen Add lactulose 20 g x 2 doses  AKI on CKD stage IV Patient was prescribed Bactrim prior to cystoscopy.  This has been held.  He is established with Dr. Murlean Iba for outpatient nephrology. Procalcitonin 0.24 Plan: No other IVF  Dysuria Unclear etiology.  Unable to exclude urinary tract infection at this time.  Could be secondary to recent bladder instrumentation Procalcitonin 0.24 Plan: Completed 5 days of Rocephin  GERD PPI  Hypertension PTA Norvasc  Hyperlipidemia PTA Lipitor   DVT prophylaxis: SQ heparin Code Status: Full Family Communication: Zacariah Belue [phone] on 10/19, left VM on 10/21 Disposition Plan: Status is: Inpatient Remains inpatient appropriate because: Intractable back pain secondary to L4 compression fracture.  Kyphoplasty today    Level of care: Med-Surg  Consultants:  IR Nephrology  Procedures:  None  Antimicrobials: Ceftriaxone   Subjective: Seen and examined.  Still in pain.  Limited response to medications  Objective: Vitals:   10/24/21 1250 10/24/21 1256 10/24/21 1300 10/24/21 1301  BP: (!) 145/68 (!) 140/57 (!) 141/52 (!) 141/52  Pulse: 65 63 62 66  Resp: 20 20 18 17   Temp:      TempSrc:      SpO2: 98% 97% 96% 95%  Weight:  Intake/Output Summary (Last 24 hours) at 10/24/2021 1307 Last data filed at 10/24/2021 0745 Gross per 24 hour  Intake --  Output 1300 ml  Net -1300 ml   Filed  Weights   10/18/21 1647  Weight: 77.1 kg    Examination:  General exam: NAD Respiratory system: Lungs clear.  No work of breathing.  Room air Cardiovascular system: S1-2, RRR, no murmurs, no pedal edema Gastrointestinal system: Soft, NT/ND, normal bowel sounds Central nervous system: Alert and oriented. No focal neurological deficits. Extremities: Decreased strength BLE.  Decreased ROM Skin: No rashes, lesions or ulcers Psychiatry: Judgement and insight appear normal. Mood & affect appropriate.     Data Reviewed: I have personally reviewed following labs and imaging studies  CBC: Recent Labs  Lab 10/18/21 1951 10/19/21 0026 10/20/21 0812 10/24/21 0545  WBC 8.3 7.1 6.0 3.4*  NEUTROABS  --   --  4.7 1.8  HGB 9.6* 8.8* 8.9* 9.5*  HCT 30.6* 28.0* 28.8* 29.4*  MCV 96.8 95.6 98.3 95.1  PLT 155 151 155 621*   Basic Metabolic Panel: Recent Labs  Lab 10/18/21 1951 10/18/21 2302 10/20/21 0815 10/21/21 1030 10/24/21 0545  NA 131* 135 135 137 138  K 5.4* 5.4* 5.4* 5.1 4.9  CL 101 108 109 109 110  CO2 20* 21* 18* 20* 23  GLUCOSE 110* 107* 97 139* 111*  BUN 59* 63* 61* 62* 46*  CREATININE 3.59* 3.66* 2.84* 2.18* 1.65*  CALCIUM 8.8* 9.1 8.6* 8.5* 9.1  MG  --  2.4  --   --   --   PHOS  --  6.0* 4.2  --   --    GFR: Estimated Creatinine Clearance: 29.2 mL/min (A) (by C-G formula based on SCr of 1.65 mg/dL (H)). Liver Function Tests: Recent Labs  Lab 10/20/21 0815  ALBUMIN 2.9*   No results for input(s): "LIPASE", "AMYLASE" in the last 168 hours. No results for input(s): "AMMONIA" in the last 168 hours. Coagulation Profile: Recent Labs  Lab 10/24/21 0545  INR 1.1   Cardiac Enzymes: No results for input(s): "CKTOTAL", "CKMB", "CKMBINDEX", "TROPONINI" in the last 168 hours. BNP (last 3 results) No results for input(s): "PROBNP" in the last 8760 hours. HbA1C: No results for input(s): "HGBA1C" in the last 72 hours. CBG: Recent Labs  Lab 10/21/21 0744  GLUCAP  125*   Lipid Profile: No results for input(s): "CHOL", "HDL", "LDLCALC", "TRIG", "CHOLHDL", "LDLDIRECT" in the last 72 hours. Thyroid Function Tests: No results for input(s): "TSH", "T4TOTAL", "FREET4", "T3FREE", "THYROIDAB" in the last 72 hours. Anemia Panel: No results for input(s): "VITAMINB12", "FOLATE", "FERRITIN", "TIBC", "IRON", "RETICCTPCT" in the last 72 hours. Sepsis Labs: Recent Labs  Lab 10/19/21 0026  PROCALCITON 0.24    No results found for this or any previous visit (from the past 240 hour(s)).       Radiology Studies: No results found.      Scheduled Meds:  fentaNYL       midazolam       amLODipine  5 mg Oral Daily   atorvastatin  40 mg Oral Daily   diclofenac Sodium  4 g Topical QID   fluticasone furoate-vilanterol  1 puff Inhalation Daily   And   umeclidinium bromide  1 puff Inhalation Daily   gabapentin  300 mg Oral BID   heparin  5,000 Units Subcutaneous Q8H   lactulose  20 g Oral BID   lidocaine (PF)       polyethylene glycol  17 g Oral Daily  senna-docusate  1 tablet Oral BID   Continuous Infusions:  vancomycin     vancomycin     vancomycin 1,000 mg (10/24/21 1253)     LOS: 4 days      Sidney Ace, MD [facility]   If 7PM-7AM, please contact night-coverage  10/24/2021, 1:07 PM

## 2021-10-24 NOTE — Plan of Care (Signed)
  Problem: Clinical Measurements: Goal: Diagnostic test results will improve Outcome: Progressing   Problem: Clinical Measurements: Goal: Will remain free from infection Outcome: Progressing   Problem: Activity: Goal: Risk for activity intolerance will decrease Outcome: Progressing   Problem: Coping: Goal: Level of anxiety will decrease Outcome: Progressing   Problem: Pain Managment: Goal: General experience of comfort will improve Outcome: Progressing   Problem: Skin Integrity: Goal: Risk for impaired skin integrity will decrease Outcome: Progressing

## 2021-10-25 ENCOUNTER — Inpatient Hospital Stay: Payer: Medicare Other

## 2021-10-25 DIAGNOSIS — R52 Pain, unspecified: Secondary | ICD-10-CM | POA: Diagnosis not present

## 2021-10-25 NOTE — Progress Notes (Signed)
0 PROGRESS NOTE    RANVIR RENOVATO  VFI:[id number] DOB: Jun 07, 1928 DOA: 10/18/2021 PCP: [name], MD    Brief Narrative:  88 year old male with history of L1 compression fracture, BPH, bladder tumor, status post TURPT, urocystoscopy, ureteral tumor biopsy, atrial fibrillation, hypertension, GERD, history of diverticulosis with diverticulitis, renal cell carcinoma status post left nephroureterectomy in August 2015, hyperlipidemia, Mobitz type II AV block, status postcardiac pacemaker placement in 2015, who presents emergency department for chief concerns of low back pain.  Pursued CT abdomen pelvis given concern for possible lytic lesions of metastatic disease.  Study limited by lack of IV contrast however no metastatic disease noted.  There was L4 compression fracture with 60 to 70% height loss.  Also with AKI on CKD stage IV.  Nephrology consult requested. Interventional radiology has approved patient for kyphoplasty.  Insurance approval obtained.    Status post image guided kyphoplasty 10/23.  Tolerated procedure well.  Continues to complain of some back pain on 10/24 but improved from prior.    Assessment & Plan:   Principal Problem:   Inadequate pain control Active Problems:   Hyperlipidemia   Left bundle branch block   Urothelial carcinoma of kidney (HCC)   HTN (hypertension)   Renal cancer, left (HCC)   Mobitz type 2 second degree AV block   COPD (chronic obstructive pulmonary disease) (HCC)   GERD (gastroesophageal reflux disease)   Stage 3a chronic kidney disease (CKD) (HCC)   Type 2 diabetes mellitus without complication, without long-term current use of insulin (HCC)   Closed compression fracture of L3 lumbar vertebra, initial encounter (HCC)   Hyperkalemia   AKI (acute kidney injury) ([place])   Heart failure with preserved ejection fraction (HCC)   Dysuria   Lumbar radiculopathy  Intractable back pain L4 compression fracture Patient has had poor tolerance of  p.o. narcotic regimen.  States it constipates him and does not control his pain.  TLSO brace counseled.  Patient states this is uncomfortable. -IR approved patient for kyphoplasty -Neurosurgery recommends LSO brace when upright.  Does not recommend surgical intervention at this time -Pain control has been challenging -Poor tolerance of narcotic -Status post kyphoplasty 10/23 Plan: continue multimodal pain regimen and bowel regimen TLSO brace when upright PT and OT follow-up Discharge planning  Constipation Likely secondary to narcotic administration.  Immobility also contributing factor. Poor response to previously scheduled bowel regimen Plan: Continue scheduled bowel regimen Dulcolax suppository 10 mg  AKI on CKD stage IV Patient was prescribed Bactrim prior to cystoscopy.  This has been held.  He is established with Dr. Murlean Iba for outpatient nephrology. Procalcitonin 0.24 Plan: No further IVF  Dysuria Unclear etiology.  Unable to exclude urinary tract infection at this time.  Could be secondary to recent bladder instrumentation Procalcitonin 0.24 Plan: Completed 5 days of Rocephin No further antibiotics  GERD PPI  Hypertension PTA Norvasc  Hyperlipidemia PTA Lipitor   DVT prophylaxis: SQ heparin Code Status: Full Family Communication: Shaquan Missey [phone] on 10/19, left VM on 10/21, left VM on 10/24 Disposition Plan: Status is: Inpatient Remains inpatient appropriate because: Intractable back pain secondary to L4 compression fracture.  Plasty postoperative day 1.  Needs PT and OT reevaluations.  Anticipate discharge to skilled nursing facility.    Level of care: Med-Surg  Consultants:  IR Nephrology  Procedures:  L4 kyphoplasty 10/23  Antimicrobials:   Subjective: Seen and examined.  Pain control improved after kyphoplasty yesterday.  Objective: Vitals:   10/24/21 1626 10/24/21 1955 10/25/21  0427 10/25/21 0924  BP: (!) 141/55 (!)  132/48 (!) 137/45 (!) 145/51  Pulse: 63 (!) 57 60 61  Resp: 17 18 17 16   Temp: (!) 97.5 F (36.4 C)  97.9 F (36.6 C) 98.1 F (36.7 C)  TempSrc:      SpO2: 94% 91% 94% 96%  Weight:        Intake/Output Summary (Last 24 hours) at 10/25/2021 1259 Last data filed at 10/25/2021 0925 Gross per 24 hour  Intake 100 ml  Output 1550 ml  Net -1450 ml   Filed Weights   10/18/21 1647  Weight: 77.1 kg    Examination:  General exam: No acute distress Respiratory system: Lungs clear.  No work of breathing.  Room air Cardiovascular system: S1-2, RRR, no murmurs, no pedal edema Gastrointestinal system: Soft, NT/ND, normal bowel sounds Central nervous system: Alert and oriented. No focal neurological deficits. Extremities: Decreased strength BLE.  Decreased ROM Skin: No rashes, lesions or ulcers Psychiatry: Judgement and insight appear normal. Mood & affect appropriate.     Data Reviewed: I have personally reviewed following labs and imaging studies  CBC: Recent Labs  Lab 10/18/21 1951 10/19/21 0026 10/20/21 0812 10/24/21 0545  WBC 8.3 7.1 6.0 3.4*  NEUTROABS  --   --  4.7 1.8  HGB 9.6* 8.8* 8.9* 9.5*  HCT 30.6* 28.0* 28.8* 29.4*  MCV 96.8 95.6 98.3 95.1  PLT 155 151 155 599*   Basic Metabolic Panel: Recent Labs  Lab 10/18/21 1951 10/18/21 2302 10/20/21 0815 10/21/21 1030 10/24/21 0545  NA 131* 135 135 137 138  K 5.4* 5.4* 5.4* 5.1 4.9  CL 101 108 109 109 110  CO2 20* 21* 18* 20* 23  GLUCOSE 110* 107* 97 139* 111*  BUN 59* 63* 61* 62* 46*  CREATININE 3.59* 3.66* 2.84* 2.18* 1.65*  CALCIUM 8.8* 9.1 8.6* 8.5* 9.1  MG  --  2.4  --   --   --   PHOS  --  6.0* 4.2  --   --    GFR: Estimated Creatinine Clearance: 29.2 mL/min (A) (by C-G formula based on SCr of 1.65 mg/dL (H)). Liver Function Tests: Recent Labs  Lab 10/20/21 0815  ALBUMIN 2.9*   No results for input(s): "LIPASE", "AMYLASE" in the last 168 hours. No results for input(s): "AMMONIA" in the last 168  hours. Coagulation Profile: Recent Labs  Lab 10/24/21 0545  INR 1.1   Cardiac Enzymes: No results for input(s): "CKTOTAL", "CKMB", "CKMBINDEX", "TROPONINI" in the last 168 hours. BNP (last 3 results) No results for input(s): "PROBNP" in the last 8760 hours. HbA1C: No results for input(s): "HGBA1C" in the last 72 hours. CBG: Recent Labs  Lab 10/21/21 0744  GLUCAP 125*   Lipid Profile: No results for input(s): "CHOL", "HDL", "LDLCALC", "TRIG", "CHOLHDL", "LDLDIRECT" in the last 72 hours. Thyroid Function Tests: No results for input(s): "TSH", "T4TOTAL", "FREET4", "T3FREE", "THYROIDAB" in the last 72 hours. Anemia Panel: No results for input(s): "VITAMINB12", "FOLATE", "FERRITIN", "TIBC", "IRON", "RETICCTPCT" in the last 72 hours. Sepsis Labs: Recent Labs  Lab 10/19/21 0026  PROCALCITON 0.24    No results found for this or any previous visit (from the past 240 hour(s)).       Radiology Studies: DG Abd 1 View  Result Date: 10/25/2021 CLINICAL DATA:  Constipation EXAM: ABDOMEN - 1 VIEW COMPARISON:  CT abdomen and pelvis dated October 19, 2021 FINDINGS: Nonobstructive bowel-gas pattern. Moderate stool burden. Right ureter stent which unchanged in position when compared with  prior CT. Partially visualized lung bases with bibasilar atelectasis. Old left-sided rib fractures. Degenerative changes of the lumbar spine with kyphoplasty changes. No acute osseous abnormality. IMPRESSION: Nonobstructive bowel-gas pattern.  Moderate stool burden. Electronically Signed   By: Yetta Glassman M.D.   On: 10/25/2021 08:36   IR KYPHO LUMBAR INC FX REDUCE BONE BX UNI/BIL CANNULATION INC/IMAGING  Result Date: 10/24/2021 INDICATION: Acute L4 vertebral body compression fracture EXAM: L4 vertebral body augmentation using balloon kyphoplasty COMPARISON:  None Available. MEDICATIONS: Documented in EMR ANESTHESIA/SEDATION: Moderate (conscious) sedation was employed during this procedure. A total of  Versed 1 mg and Fentanyl 75 mcg was administered intravenously by the radiology nurse. Total intra-service moderate Sedation Time: 27 minutes. The patient's level of consciousness and vital signs were monitored continuously by radiology nursing throughout the procedure under my direct supervision. FLUOROSCOPY: Radiation Exposure Index (as provided by the fluoroscopic device): 4.5 minutes (69 mGy) COMPLICATIONS: None immediate. PROCEDURE: Informed written consent was obtained from the patient after a thorough discussion of the procedural risks, benefits and alternatives. All questions were addressed. Maximal Sterile Barrier Technique was utilized including caps, mask, sterile gowns, sterile gloves, sterile drape, hand hygiene and skin antiseptic. A timeout was performed prior to the initiation of the procedure. The patient was positioned prone on the exam table. A bipedicular access was planned. Skin entry site(s) were marked overlying the L4 vertebral body using fluoroscopy. The overlying skin was then prepped and draped in the standard sterile fashion. Local analgesia was obtained with 1% lidocaine. Attention was first turned to the right side. Under fluoroscopic guidance, a 10 gauge introducer needle was advanced towards the lateral margin of the pedicle. Using multiple projections, the introducer needle was advanced towards the posterior margin of the vertebral body via a transpedicular approach. The inner needle was then removed, and the drill was advanced towards the anterior margin of the vertebral body. Attention was then turned to the contralateral side. Under fluoroscopic guidance, a 10 gauge introducer needle was advanced towards the lateral margin of the pedicle. Using multiple projections, the introducer needle was advanced towards the posterior margin of the vertebral body via a transpedicular approach. The inner needle was then removed, and the drill was advanced towards the anterior margin of the  vertebral body. The Kyphon 15 mm inflatable bone tamps were then advanced through the bilateral transpedicular access needles and positioned within the mid vertebral body. Kyphoplasty was then performed, ensuring that the balloon contours stayed within the vertebral body margins. The balloons were then deflated and removed, followed by advancement of the bone filler devices bilaterally and the instillation of acrylic bone cement with excellent filling in the AP and lateral projections. No extravasation was noted in the disk spaces or posteriorly into the spinal canal. No epidural venous contamination was seen. At the end of the procedure, the introducer cannulas and bone filler devices were then removed without difficulty. Clean dressings were placed after hemostasis. The patient tolerated all aspects of the procedure well, and was transferred to recovery in stable condition. IMPRESSION: 1. Successful L4 vertebral body augmentation using balloon kyphoplasty. If the patient has known osteoporosis, recommend treatment as clinically indicated. If the patient's bone density status is unknown, DEXA scan is recommended. Electronically Signed   By: Albin Felling M.D.   On: 10/24/2021 15:04        Scheduled Meds:  amLODipine  5 mg Oral Daily   atorvastatin  40 mg Oral Daily   diclofenac Sodium  4 g Topical  QID   fluticasone furoate-vilanterol  1 puff Inhalation Daily   And   umeclidinium bromide  1 puff Inhalation Daily   gabapentin  300 mg Oral BID   heparin  5,000 Units Subcutaneous Q8H   polyethylene glycol  17 g Oral Daily   senna-docusate  1 tablet Oral BID   Continuous Infusions:     LOS: 5 days      Sidney Ace, MD [facility]   If 7PM-7AM, please contact night-coverage  10/25/2021, 12:59 PM

## 2021-10-25 NOTE — Progress Notes (Signed)
OT Cancellation Note  Patient Details Name: Jacob [name] MRN: [id number] DOB: Sep 20, 1928   Cancelled Treatment:    Reason Eval/Treat Not Completed: Pain limiting ability to participate;Fatigue/lethargy limiting ability to participate. Attempting to see pt for tx session. Pt asleep in bed upon arrival, VC for alertness. Pt reporting just getting back to bed after sitting in recliner for lunch. RN reporting just giving pain meds, pt endorsing 10/10 back pain. Will re-attempt as able.   Doneta Public 10/25/2021, 2:54 PM

## 2021-10-25 NOTE — Progress Notes (Addendum)
[facility]  PROGRESS NOTE   Subjective:   Patient seen resting in bed, alert and oriented Tolerating small meals without nausea and vomiting Remains on room air Complains of lower back soreness from procedure yesterday   Objective:  Vital signs: Blood pressure (!) 145/51, pulse 61, temperature 98.1 F (36.7 C), resp. rate 16, weight 77.1 kg, SpO2 96 %.  Intake/Output Summary (Last 24 hours) at 10/25/2021 1107 Last data filed at 10/25/2021 0925 Gross per 24 hour  Intake 100 ml  Output 1550 ml  Net -1450 ml    Filed Weights   10/18/21 1647  Weight: 77.1 kg     Physical Exam: General:  No acute distress  Head:  Normocephalic, atraumatic. Moist oral mucosal membranes  Eyes:  Anicteric  Lungs:   Clear to auscultation, normal effort  Heart:  S1S2 no rubs  Abdomen:   Soft, nontender, bowel sounds present  Extremities: No peripheral edema.  Neurologic:  Awake, alert, following commands  Skin:  No lesions  Access: None    Basic Metabolic Panel: Recent Labs  Lab 10/18/21 1951 10/18/21 2302 10/20/21 0815 10/21/21 1030 10/24/21 0545  NA 131* 135 135 137 138  K 5.4* 5.4* 5.4* 5.1 4.9  CL 101 108 109 109 110  CO2 20* 21* 18* 20* 23  GLUCOSE 110* 107* 97 139* 111*  BUN 59* 63* 61* 62* 46*  CREATININE 3.59* 3.66* 2.84* 2.18* 1.65*  CALCIUM 8.8* 9.1 8.6* 8.5* 9.1  MG  --  2.4  --   --   --   PHOS  --  6.0* 4.2  --   --      CBC: Recent Labs  Lab 10/18/21 1951 10/19/21 0026 10/20/21 0812 10/24/21 0545  WBC 8.3 7.1 6.0 3.4*  NEUTROABS  --   --  4.7 1.8  HGB 9.6* 8.8* 8.9* 9.5*  HCT 30.6* 28.0* 28.8* 29.4*  MCV 96.8 95.6 98.3 95.1  PLT 155 151 155 131*      Urinalysis: No results for input(s): "COLORURINE", "LABSPEC", "PHURINE", "GLUCOSEU", "HGBUR", "BILIRUBINUR", "KETONESUR", "PROTEINUR", "UROBILINOGEN", "NITRITE", "LEUKOCYTESUR" in the last 72 hours.  Invalid input(s): "APPERANCEUR"    Imaging: DG Abd 1 View  Result Date:  10/25/2021 CLINICAL DATA:  Constipation EXAM: ABDOMEN - 1 VIEW COMPARISON:  CT abdomen and pelvis dated October 19, 2021 FINDINGS: Nonobstructive bowel-gas pattern. Moderate stool burden. Right ureter stent which unchanged in position when compared with prior CT. Partially visualized lung bases with bibasilar atelectasis. Old left-sided rib fractures. Degenerative changes of the lumbar spine with kyphoplasty changes. No acute osseous abnormality. IMPRESSION: Nonobstructive bowel-gas pattern.  Moderate stool burden. Electronically Signed   By: Yetta Glassman M.D.   On: 10/25/2021 08:36   IR KYPHO LUMBAR INC FX REDUCE BONE BX UNI/BIL CANNULATION INC/IMAGING  Result Date: 10/24/2021 INDICATION: Acute L4 vertebral body compression fracture EXAM: L4 vertebral body augmentation using balloon kyphoplasty COMPARISON:  None Available. MEDICATIONS: Documented in EMR ANESTHESIA/SEDATION: Moderate (conscious) sedation was employed during this procedure. A total of Versed 1 mg and Fentanyl 75 mcg was administered intravenously by the radiology nurse. Total intra-service moderate Sedation Time: 27 minutes. The patient's level of consciousness and vital signs were monitored continuously by radiology nursing throughout the procedure under my direct supervision. FLUOROSCOPY: Radiation Exposure Index (as provided by the fluoroscopic device): 4.5 minutes (69 mGy) COMPLICATIONS: None immediate. PROCEDURE: Informed written consent was obtained from the patient after a thorough discussion of the procedural risks, benefits and alternatives. All questions were addressed. Maximal  Sterile Barrier Technique was utilized including caps, mask, sterile gowns, sterile gloves, sterile drape, hand hygiene and skin antiseptic. A timeout was performed prior to the initiation of the procedure. The patient was positioned prone on the exam table. A bipedicular access was planned. Skin entry site(s) were marked overlying the L4 vertebral body  using fluoroscopy. The overlying skin was then prepped and draped in the standard sterile fashion. Local analgesia was obtained with 1% lidocaine. Attention was first turned to the right side. Under fluoroscopic guidance, a 10 gauge introducer needle was advanced towards the lateral margin of the pedicle. Using multiple projections, the introducer needle was advanced towards the posterior margin of the vertebral body via a transpedicular approach. The inner needle was then removed, and the drill was advanced towards the anterior margin of the vertebral body. Attention was then turned to the contralateral side. Under fluoroscopic guidance, a 10 gauge introducer needle was advanced towards the lateral margin of the pedicle. Using multiple projections, the introducer needle was advanced towards the posterior margin of the vertebral body via a transpedicular approach. The inner needle was then removed, and the drill was advanced towards the anterior margin of the vertebral body. The Kyphon 15 mm inflatable bone tamps were then advanced through the bilateral transpedicular access needles and positioned within the mid vertebral body. Kyphoplasty was then performed, ensuring that the balloon contours stayed within the vertebral body margins. The balloons were then deflated and removed, followed by advancement of the bone filler devices bilaterally and the instillation of acrylic bone cement with excellent filling in the AP and lateral projections. No extravasation was noted in the disk spaces or posteriorly into the spinal canal. No epidural venous contamination was seen. At the end of the procedure, the introducer cannulas and bone filler devices were then removed without difficulty. Clean dressings were placed after hemostasis. The patient tolerated all aspects of the procedure well, and was transferred to recovery in stable condition. IMPRESSION: 1. Successful L4 vertebral body augmentation using balloon kyphoplasty. If  the patient has known osteoporosis, recommend treatment as clinically indicated. If the patient's bone density status is unknown, DEXA scan is recommended. Electronically Signed   By: Albin Felling M.D.   On: 10/24/2021 15:04     Medications:      amLODipine  5 mg Oral Daily   atorvastatin  40 mg Oral Daily   diclofenac Sodium  4 g Topical QID   fluticasone furoate-vilanterol  1 puff Inhalation Daily   And   umeclidinium bromide  1 puff Inhalation Daily   gabapentin  300 mg Oral BID   heparin  5,000 Units Subcutaneous Q8H   polyethylene glycol  17 g Oral Daily   senna-docusate  1 tablet Oral BID    Assessment/ Plan:     Principal Problem:   Inadequate pain control Active Problems:   Hyperlipidemia   Left bundle branch block   Urothelial carcinoma of kidney (HCC)   HTN (hypertension)   Renal cancer, left (HCC)   Mobitz type 2 second degree AV block   COPD (chronic obstructive pulmonary disease) (HCC)   GERD (gastroesophageal reflux disease)   Stage 3a chronic kidney disease (CKD) (HCC)   Type 2 diabetes mellitus without complication, without long-term current use of insulin (HCC)   Closed compression fracture of L3 lumbar vertebra, initial encounter (HCC)   Hyperkalemia   AKI (acute kidney injury) ([place])   Heart failure with preserved ejection fraction (HCC)   Dysuria   Lumbar radiculopathy  BARCLAY LENNOX is a 88 y.o. male with past medical history including past medical history includes atrial fibrillation, GERD, hypertension, BPH, bladder tumor status post TURPT, urethral tumor biopsy, urocystoscopy and  chronic kidney disease stage IV.  Patient presents to the emergency department with complaints of back pain and has been admitted under observation for Hyperkalemia [E87.5] Acute kidney insufficiency [N28.9] Inadequate pain control [R52] Severe back pain [M54.9] Closed compression fracture of L3 lumbar vertebra, initial encounter ([place]) [S32.030A] Constipation,  unspecified constipation type [K59.00] Lumbar radiculopathy [M54.16]   #1: Acute kidney injury: Patient was admitted with acute kidney injury with hyperkalemia.  S/p left nephrectomy.  Creatinine has returned to baseline with adequate urination noted, 1.4 L in preceding 24 hours. Will schedule an appt with our office at discharge for continued follow up.    #2: Anemia with chronic kidney disease: Hemoglobin slowly improving, 9.5.  We will continue to monitor.   #3: Hypertension: Blood pressure stable for this patient.   #4: L4 compression fracture: Patient received kyphoplasty on 10/24/2021.  Continue pain management and supportive care.  We will sign off case due to stable renal function. Feel free to contact with additional questions or concerns     LOS: [facility] 10/24/202311:07 AM

## 2021-10-25 NOTE — Plan of Care (Signed)
  Problem: Education: Goal: Knowledge of General Education information will improve Description: Including pain rating scale, medication(s)/side effects and non-pharmacologic comfort measures Outcome: Progressing   Problem: Health Behavior/Discharge Planning: Goal: Ability to manage health-related needs will improve Outcome: Progressing   Problem: Clinical Measurements: Goal: Ability to maintain clinical measurements within normal limits will improve Outcome: Progressing Goal: Diagnostic test results will improve Outcome: Progressing Goal: Respiratory complications will improve Outcome: Progressing Goal: Cardiovascular complication will be avoided Outcome: Progressing   Problem: Activity: Goal: Risk for activity intolerance will decrease Outcome: Progressing   Problem: Nutrition: Goal: Adequate nutrition will be maintained Outcome: Progressing   Problem: Pain Managment: Goal: General experience of comfort will improve Outcome: Progressing

## 2021-10-25 NOTE — TOC Progression Note (Signed)
Transition of Care [facility]) - Progression Note    Patient Details  Name: Jacob [name] MRN: [id number] Date of Birth: 1928/01/27  Transition [facility]) CM/SW [place], RN Phone Number: 10/25/2021, 12:20 PM  Clinical Narrative:    TOC CONtinues to monitor for DC planning and needs, he had Kypho surgery yesterday, PT To eval and make recommendation        Expected Discharge Plan and Services                                                 Social Determinants of Health (SDOH) Interventions    Readmission Risk Interventions     No data to display

## 2021-10-25 NOTE — Progress Notes (Signed)
Physical Therapy Re-Evaluation Patient Details Name: Jacob [name] MRN: [id number] DOB: [birth year] Today's Date: 10/25/2021   History of Present Illness Pt is a 88 yo male that presented to ED for low back pain. noted for L4 compression fracture. S/p kyphoplasty of L4 10/24/21. Past medical history significant for GERD, diverticulitis, HTN, HDL, renal cancer, and prostate cancer, previous lumbar fractures, COPD, LBBB, pacemaker.    PT Comments    Patient alert, agreeable to PT with some encouragement. Pt seen as re-evaluation due to s/p L4 kyphoplasty, goals updated as needed. He required minA to roll L and then R, and maxA to come from sidelying to sitting. Pt challenged by pain. Sit <> stand from EOB and BSC with RW, initially modA but with BUE support minA for steadying from [place] for pericare once in standing. Step pivot twice during session, minAx2 for safety. Up in chair with needs in reach at end of session. The patient would benefit from further skilled PT intervention to continue to progress towards goals. Recommendation remains appropriate.      Recommendations for follow up therapy are one component of a multi-disciplinary discharge planning process, led by the attending physician.  Recommendations may be updated based on patient status, additional functional criteria and insurance authorization.  Follow Up Recommendations  Skilled nursing-short term rehab (<3 hours/day) Can patient physically be transported by private vehicle: No   Assistance Recommended at Discharge Frequent or constant Supervision/Assistance  Patient can return home with the following Two people to help with walking and/or transfers;Two people to help with bathing/dressing/bathroom;Help with stairs or ramp for entrance;Assist for transportation;Direct supervision/assist for medications management;Assistance with cooking/housework   Equipment Recommendations  Other (comment) (TBD)    Recommendations  for Other Services       Precautions / Restrictions Precautions Precautions: Fall Restrictions Weight Bearing Restrictions: No     Mobility  Bed Mobility Overal bed mobility: Needs Assistance Bed Mobility: Rolling, Sidelying to Sit Rolling: Min assist Sidelying to sit: Max assist            Transfers Overall transfer level: Needs assistance Equipment used: Rolling walker (2 wheels) Transfers: Sit to/from Stand, Bed to chair/wheelchair/BSC Sit to Stand: Mod assist, +2 safety/equipment   Step pivot transfers: Min assist, +2 safety/equipment            Ambulation/Gait                   Stairs             Wheelchair Mobility    Modified Rankin (Stroke Patients Only)       Balance Overall balance assessment: Needs assistance Sitting-balance support: Feet supported, Bilateral upper extremity supported Sitting balance-Leahy Scale: Fair Sitting balance - Comments: Pt had difficulty moving towards EOB in preparation for standing   Standing balance support: Bilateral upper extremity supported, Reliant on assistive device for balance Standing balance-Leahy Scale: Fair                              Cognition Arousal/Alertness: Awake/alert Behavior During Therapy: WFL for tasks assessed/performed Overall Cognitive Status: Within Functional Limits for tasks assessed                                          Exercises Other Exercises Other Exercises: step pivot from EBO to [facility], to  recliner. minAx2. very small, shuffled steps    General Comments        Pertinent Vitals/Pain Pain Assessment Pain Assessment: Faces Faces Pain Scale: Hurts even more Pain Location: lower back Pain Descriptors / Indicators: Aching, Constant, Guarding, Grimacing, Shooting, Sharp Pain Intervention(s): Monitored during session, Limited activity within patient's tolerance, Repositioned    Home Living                           Prior Function            PT Goals (current goals can now be found in the care plan section) Acute Rehab PT Goals PT Goal Formulation: With patient Time For Goal Achievement: 11/08/21 Potential to Achieve Goals: Fair Progress towards PT goals: Progressing toward goals    Frequency    7X/week      PT Plan Current plan remains appropriate    Co-evaluation              AM-PAC PT "6 Clicks" Mobility   Outcome Measure  Help needed turning from your back to your side while in a flat bed without using bedrails?: A Lot Help needed moving from lying on your back to sitting on the side of a flat bed without using bedrails?: A Lot Help needed moving to and from a bed to a chair (including a wheelchair)?: A Lot Help needed standing up from a chair using your arms (e.g., wheelchair or bedside chair)?: A Lot Help needed to walk in hospital room?: A Lot Help needed climbing 3-5 steps with a railing? : Total 6 Click Score: 11    End of Session Equipment Utilized During Treatment: Gait belt Activity Tolerance: Patient limited by pain;Patient limited by fatigue Patient left: in chair;with chair alarm set;with call bell/phone within reach Nurse Communication: Mobility status PT Visit Diagnosis: Other abnormalities of gait and mobility (R26.89);History of falling (Z91.81);Muscle weakness (generalized) (M62.81)     Time: 5027-7412 PT Time Calculation (min) (ACUTE ONLY): 24 min  Charges:  $Therapeutic Activity: 8-22 mins                     Lieutenant Diego PT, DPT 1:00 PM,10/25/21

## 2021-10-26 ENCOUNTER — Ambulatory Visit: Payer: Medicare Other | Admitting: Urology

## 2021-10-26 DIAGNOSIS — R52 Pain, unspecified: Secondary | ICD-10-CM | POA: Diagnosis not present

## 2021-10-26 DIAGNOSIS — S32040A Wedge compression fracture of fourth lumbar vertebra, initial encounter for closed fracture: Secondary | ICD-10-CM | POA: Diagnosis not present

## 2021-10-26 MED ORDER — HYDROCORTISONE 0.5 % EX CREA
TOPICAL_CREAM | Freq: Two times a day (BID) | CUTANEOUS | Status: DC
  Filled 2021-10-26: qty 28.35

## 2021-10-26 NOTE — Progress Notes (Signed)
Physical Therapy Treatment Patient Details Name: Jacob [name] MRN: [id number] DOB: 1928-03-16 Today's Date: 10/26/2021   History of Present Illness Pt is a 88 yo male that presented to ED for low back pain. noted for L4 compression fracture. S/p kyphoplasty of L4 10/24/21. Past medical history significant for GERD, diverticulitis, HTN, HDL, renal cancer, and prostate cancer, previous lumbar fractures, COPD, LBBB, pacemaker.    PT Comments    Patient seated with OT on BSC upon PT entrance. Sit <> stand with RW and CGA-minAx2, LSO brace donned prior to mobility. He was able to take a few steps to transfer to the recliner, very short shuffled steps noted. With encouragement and after a rest break he was able to perform a few seated exercises as well. Pt vitals assessed due to some tremors noted, RN notified and requested pt to return to 2L. (May have desaturated to 85% after mobility but unclear pleth). BP WFLs as well. The patient would benefit from further skilled PT intervention to continue to progress towards goals. Recommendation remains appropriate.       Recommendations for follow up therapy are one component of a multi-disciplinary discharge planning process, led by the attending physician.  Recommendations may be updated based on patient status, additional functional criteria and insurance authorization.  Follow Up Recommendations  Skilled nursing-short term rehab (<3 hours/day) Can patient physically be transported by private vehicle: No   Assistance Recommended at Discharge Frequent or constant Supervision/Assistance  Patient can return home with the following Two people to help with walking and/or transfers;Two people to help with bathing/dressing/bathroom;Help with stairs or ramp for entrance;Assist for transportation;Direct supervision/assist for medications management;Assistance with cooking/housework   Equipment Recommendations  Other (comment) (TBD)    Recommendations  for Other Services       Precautions / Restrictions Precautions Precautions: Fall Restrictions Weight Bearing Restrictions: No     Mobility  Bed Mobility               General bed mobility comments: sitting on BSC with OT    Transfers Overall transfer level: Needs assistance Equipment used: Rolling walker (2 wheels) Transfers: Sit to/from Stand, Bed to chair/wheelchair/BSC Sit to Stand: +2 safety/equipment, Min assist   Step pivot transfers: Min guard       General transfer comment: very small, shuffled steps    Ambulation/Gait               General Gait Details: declined due to fatigue, pain   Stairs             Wheelchair Mobility    Modified Rankin (Stroke Patients Only)       Balance Overall balance assessment: Needs assistance Sitting-balance support: Feet supported, Bilateral upper extremity supported Sitting balance-Leahy Scale: Fair     Standing balance support: Bilateral upper extremity supported, Reliant on assistive device for balance Standing balance-Leahy Scale: Fair                              Cognition Arousal/Alertness: Awake/alert Behavior During Therapy: WFL for tasks assessed/performed Overall Cognitive Status: Within Functional Limits for tasks assessed                                          Exercises General Exercises - Lower Extremity Ankle Circles/Pumps: AROM, Both, 10 reps Gluteal Sets: AROM, Strengthening,  Both, 10 reps Long Arc Quad: AROM, Strengthening, Both, 10 reps (limited ROM) Hip Flexion/Marching: AROM, Strengthening, Both (limited ROM)    General Comments        Pertinent Vitals/Pain Pain Assessment Pain Assessment: Faces Faces Pain Scale: Hurts even more Pain Location: lower back Pain Descriptors / Indicators: Aching, Constant, Guarding, Grimacing, Shooting, Sharp Pain Intervention(s): Limited activity within patient's tolerance, Monitored during session,  Repositioned    Home Living Family/patient expects to be discharged to:: Private residence Living Arrangements: Alone Available Help at Discharge: Friend(s);Available PRN/intermittently Type of Home: House Home Access: Stairs to enter   [employer] of Steps: 2   Home Layout: One level Home Equipment: Grab bars - tub/shower;Cane - quad;Tub bench;Toilet riser;Rollator (4 wheels);Hand held shower head;Rolling Walker (2 wheels) Additional Comments: info from chart review    Prior Function            PT Goals (current goals can now be found in the care plan section) Progress towards PT goals: Progressing toward goals    Frequency    7X/week      PT Plan Current plan remains appropriate    Co-evaluation              AM-PAC PT "6 Clicks" Mobility   Outcome Measure  Help needed turning from your back to your side while in a flat bed without using bedrails?: A Lot Help needed moving from lying on your back to sitting on the side of a flat bed without using bedrails?: A Lot Help needed moving to and from a bed to a chair (including a wheelchair)?: A Lot Help needed standing up from a chair using your arms (e.g., wheelchair or bedside chair)?: A Lot Help needed to walk in hospital room?: A Lot Help needed climbing 3-5 steps with a railing? : Total 6 Click Score: 11    End of Session Equipment Utilized During Treatment: Gait belt Activity Tolerance: Patient limited by pain;Patient limited by fatigue Patient left: in chair;with chair alarm set;with call bell/phone within reach Nurse Communication: Mobility status PT Visit Diagnosis: Other abnormalities of gait and mobility (R26.89);History of falling (Z91.81);Muscle weakness (generalized) (M62.81)     Time: 1130-1150 PT Time Calculation (min) (ACUTE ONLY): 20 min  Charges:  $Therapeutic Exercise: 8-22 mins                    Lieutenant Diego PT, DPT 1:57 PM,10/26/21

## 2021-10-26 NOTE — Plan of Care (Signed)

## 2021-10-26 NOTE — Progress Notes (Addendum)
Progress Note    Jacob [name]  HYW:[id number] DOB: February 22, 1928  DOA: 10/18/2021 PCP: [name], MD      Brief Narrative:    Medical records reviewed and are as summarized below:  Jacob [name] is a 88 y.o. male with history of L1 compression fracture, BPH, bladder tumor, status post TURPT, urocystoscopy, ureteral tumor biopsy, atrial fibrillation, hypertension, GERD, history of diverticulosis with diverticulitis, renal cell carcinoma status post left nephroureterectomy in August 2015, hyperlipidemia, Mobitz type II AV block, status postcardiac pacemaker placement in 2015, who presents emergency department for chief concerns of low back pain.   Pursued CT abdomen pelvis given concern for possible lytic lesions of metastatic disease.  Study limited by lack of IV contrast however no metastatic disease noted.  There was L4 compression fracture with 60 to 70% height loss.  Kyphoplasty was performed by IR on 10/24/2021.   Also with AKI on CKD stage IV.  Nephrology consult requested. Interventional radiology has approved patient for kyphoplasty.  Insurance approval obtained.           Assessment/Plan:   Principal Problem:   Inadequate pain control Active Problems:   Hyperlipidemia   Left bundle branch block   Urothelial carcinoma of kidney (HCC)   HTN (hypertension)   Renal cancer, left (HCC)   Mobitz type 2 second degree AV block   COPD (chronic obstructive pulmonary disease) (HCC)   GERD (gastroesophageal reflux disease)   Stage 3a chronic kidney disease (CKD) (HCC)   Type 2 diabetes mellitus without complication, without long-term current use of insulin (HCC)   Closed compression fracture of L4 lumbar vertebra, initial encounter (HCC)   Hyperkalemia   AKI (acute kidney injury) ([place])   Heart failure with preserved ejection fraction (HCC)   Dysuria   Lumbar radiculopathy   Body mass index is 25.1 kg/m.   Intractable back pain L4 compression  fracture S/p kyphoplasty on 10/24/2021.  Analgesics as needed for pain. -Neurosurgery recommends LSO brace when upright. -Poor tolerance of narcotic -Status post kyphoplasty 10/23  Constipation Laxatives as needed for constipation   AKI on CKD stage IV Patient was prescribed Bactrim prior to cystoscopy.  This has been held.  He is established with Dr. Murlean Iba for outpatient nephrology. Creatinine is stable.   Dysuria Unclear etiology.  Unable to exclude urinary tract infection at this time.  Could be secondary to recent bladder instrumentation Procalcitonin 0.24 Completed 5 days of IV ceftriaxone  Hydrocortisone cream prescribed for itchy lower back   Other comorbidities include GERD, hypertension, hyperlipidemia    Diet Order             Diet heart healthy/carb modified Room service appropriate? Yes; Fluid consistency: Thin  Diet effective now                            Consultants: Neurosurgeon Interventional radiologist Nephrologist  Procedures: Kyphoplasty on 10/24/2021    Medications:    amLODipine  5 mg Oral Daily   atorvastatin  40 mg Oral Daily   diclofenac Sodium  4 g Topical QID   fluticasone furoate-vilanterol  1 puff Inhalation Daily   And   umeclidinium bromide  1 puff Inhalation Daily   gabapentin  300 mg Oral BID   heparin  5,000 Units Subcutaneous Q8H   hydrocortisone cream   Topical BID   polyethylene glycol  17 g Oral Daily   senna-docusate  1 tablet Oral  BID   Continuous Infusions:   Anti-infectives (From admission, onward)    Start     Dose/Rate Route Frequency Ordered Stop   10/24/21 1253  vancomycin (VANCOREADY) IVPB 500 mg/100 mL        over 60 Minutes  Continuous PRN 10/24/21 1254 10/24/21 1253   10/24/21 1244  vancomycin (VANCOCIN) 1-5 GM/200ML-% IVPB       Note to Pharmacy: Maynor, Erin A: cabinet override      10/24/21 1244 10/25/21 0059   10/24/21 0800  vancomycin (VANCOCIN) IVPB 1000 mg/200 mL premix        Note to Pharmacy: On call to radiology for procedure   1,000 mg 200 mL/hr over 60 Minutes Intravenous On call 10/21/21 1658 10/25/21 0800   10/18/21 2345  cefTRIAXone (ROCEPHIN) 1 g in sodium chloride 0.9 % 100 mL IVPB        1 g 200 mL/hr over 30 Minutes Intravenous Every 24 hours 10/18/21 2251 10/22/21 2209              Family Communication/Anticipated D/C date and plan/Code Status   DVT prophylaxis: heparin injection 5,000 Units Start: 10/18/21 2200 Place TED hose Start: 10/18/21 2037     Code Status: Full Code  Family Communication: None Disposition Plan: Plan to discharge to SNF   Status is: Inpatient Remains inpatient appropriate because: Awaiting placement to SNF       Subjective:   Interval events noted.  He complains of generalized weakness and itching on the lower back.  His friend was at the bedside.  Helene Kelp, RN, was at the bedside  Objective:    Vitals:   10/25/21 0924 10/25/21 1509 10/25/21 2344 10/26/21 0820  BP: (!) 145/51 (!) 129/41 (!) 141/53 (!) 129/50  Pulse: 61 (!) 59 60 62  Resp: 16 16 20 16   Temp: 98.1 F (36.7 C) (!) 97.4 F (36.3 C) 98.1 F (36.7 C) 98.4 F (36.9 C)  TempSrc:      SpO2: 96% 98% 95% 90%  Weight:       No data found.   Intake/Output Summary (Last 24 hours) at 10/26/2021 1648 Last data filed at 10/26/2021 0930 Gross per 24 hour  Intake 120 ml  Output 550 ml  Net -430 ml   Filed Weights   10/18/21 1647  Weight: 77.1 kg    Exam:  GEN: NAD SKIN: Warm and dry.  Dressing on the back surgical wound is clean, dry and intact. EYES: No pallor or icterus ENT: MMM CV: RRR PULM: CTA B ABD: soft, ND, NT, +BS CNS: AAO x 3, non focal EXT: No edema or tenderness        Data Reviewed:   I have personally reviewed following labs and imaging studies:  Labs: Labs show the following:   Basic Metabolic Panel: Recent Labs  Lab 10/20/21 0815 10/21/21 1030 10/24/21 0545  NA 135 137 138  K 5.4* 5.1  4.9  CL 109 109 110  CO2 18* 20* 23  GLUCOSE 97 139* 111*  BUN 61* 62* 46*  CREATININE 2.84* 2.18* 1.65*  CALCIUM 8.6* 8.5* 9.1  PHOS 4.2  --   --    GFR Estimated Creatinine Clearance: 29.2 mL/min (A) (by C-G formula based on SCr of 1.65 mg/dL (H)). Liver Function Tests: Recent Labs  Lab 10/20/21 0815  ALBUMIN 2.9*   No results for input(s): "LIPASE", "AMYLASE" in the last 168 hours. No results for input(s): "AMMONIA" in the last 168 hours. Coagulation profile Recent Labs  Lab 10/24/21 0545  INR 1.1    CBC: Recent Labs  Lab 10/20/21 0812 10/24/21 0545  WBC 6.0 3.4*  NEUTROABS 4.7 1.8  HGB 8.9* 9.5*  HCT 28.8* 29.4*  MCV 98.3 95.1  PLT 155 131*   Cardiac Enzymes: No results for input(s): "CKTOTAL", "CKMB", "CKMBINDEX", "TROPONINI" in the last 168 hours. BNP (last 3 results) No results for input(s): "PROBNP" in the last 8760 hours. CBG: Recent Labs  Lab 10/21/21 0744  GLUCAP 125*   D-Dimer: No results for input(s): "DDIMER" in the last 72 hours. Hgb A1c: No results for input(s): "HGBA1C" in the last 72 hours. Lipid Profile: No results for input(s): "CHOL", "HDL", "LDLCALC", "TRIG", "CHOLHDL", "LDLDIRECT" in the last 72 hours. Thyroid function studies: No results for input(s): "TSH", "T4TOTAL", "T3FREE", "THYROIDAB" in the last 72 hours.  Invalid input(s): "FREET3" Anemia work up: No results for input(s): "VITAMINB12", "FOLATE", "FERRITIN", "TIBC", "IRON", "RETICCTPCT" in the last 72 hours. Sepsis Labs: Recent Labs  Lab 10/20/21 0812 10/24/21 0545  WBC 6.0 3.4*    Microbiology No results found for this or any previous visit (from the past 240 hour(s)).  Procedures and diagnostic studies:  DG Abd 1 View  Result Date: 10/25/2021 CLINICAL DATA:  Constipation EXAM: ABDOMEN - 1 VIEW COMPARISON:  CT abdomen and pelvis dated October 19, 2021 FINDINGS: Nonobstructive bowel-gas pattern. Moderate stool burden. Right ureter stent which unchanged in  position when compared with prior CT. Partially visualized lung bases with bibasilar atelectasis. Old left-sided rib fractures. Degenerative changes of the lumbar spine with kyphoplasty changes. No acute osseous abnormality. IMPRESSION: Nonobstructive bowel-gas pattern.  Moderate stool burden. Electronically Signed   By: Yetta Glassman M.D.   On: 10/25/2021 08:36               LOS: 6 days   Gildo Crisco  [facility]   Pager on [email or web address]. If 7PM-7AM, please contact night-coverage at [email or web address]     10/26/2021, 4:48 PM

## 2021-10-26 NOTE — Plan of Care (Signed)
  Problem: Education: Goal: Knowledge of General Education information will improve Description: Including pain rating scale, medication(s)/side effects and non-pharmacologic comfort measures 10/26/2021 0459 by Jacob [name], Crist Fat, RN Outcome: Progressing 10/26/2021 0428 by Chalmers Iddings, Crist Fat, RN Outcome: Progressing   Problem: Health Behavior/Discharge Planning: Goal: Ability to manage health-related needs will improve 10/26/2021 0459 by Jacob [name], Crist Fat, RN Outcome: Progressing 10/26/2021 0428 by Verlene Glantz, Crist Fat, RN Outcome: Progressing   Problem: Clinical Measurements: Goal: Ability to maintain clinical measurements within normal limits will improve 10/26/2021 0459 by Jacob [name], Crist Fat, RN Outcome: Progressing 10/26/2021 0428 by Jacob [name], Crist Fat, RN Outcome: Progressing Goal: Will remain free from infection 10/26/2021 0459 by Jacob [name], Crist Fat, RN Outcome: Progressing 10/26/2021 0428 by Jacob [name], Crist Fat, RN Outcome: Progressing Goal: Diagnostic test results will improve 10/26/2021 0459 by Jacob [name], Crist Fat, RN Outcome: Progressing 10/26/2021 0428 by Jacob [name], Crist Fat, RN Outcome: Progressing Goal: Respiratory complications will improve 10/26/2021 0459 by Jacob [name], Crist Fat, RN Outcome: Progressing 10/26/2021 0428 by Jacob [name], Crist Fat, RN Outcome: Progressing Goal: Cardiovascular complication will be avoided 10/26/2021 0459 by Jacob [name], Crist Fat, RN Outcome: Progressing 10/26/2021 0428 by Jacob [name], Crist Fat, RN Outcome: Progressing   Problem: Activity: Goal: Risk for activity intolerance will decrease 10/26/2021 0459 by Jacob [name], Crist Fat, RN Outcome: Progressing 10/26/2021 0428 by Jacob [name], Crist Fat, RN Outcome: Progressing   Problem: Nutrition: Goal: Adequate nutrition will be maintained 10/26/2021 0459 by Jacob [name], Crist Fat, RN Outcome: Progressing 10/26/2021 0428 by Jacob [name], Crist Fat, RN Outcome: Progressing   Problem: Coping: Goal: Level  of anxiety will decrease 10/26/2021 0459 by Jacob [name], Crist Fat, RN Outcome: Progressing 10/26/2021 0428 by Jacob [name], Crist Fat, RN Outcome: Progressing   Problem: Elimination: Goal: Will not experience complications related to bowel motility 10/26/2021 0459 by Jacob [name], Crist Fat, RN Outcome: Progressing 10/26/2021 0428 by Jacob [name] Speigner, Crist Fat, RN Outcome: Progressing Goal: Will not experience complications related to urinary retention 10/26/2021 0459 by Lateka Rady, Crist Fat, RN Outcome: Progressing 10/26/2021 0428 by Hakan Nudelman, Crist Fat, RN Outcome: Progressing   Problem: Pain Managment: Goal: General experience of comfort will improve 10/26/2021 0459 by Garrin Kirwan, Crist Fat, RN Outcome: Progressing 10/26/2021 0428 by Ladoris Lythgoe, Crist Fat, RN Outcome: Progressing   Problem: Safety: Goal: Ability to remain free from injury will improve 10/26/2021 0459 by Vollie Aaron, Crist Fat, RN Outcome: Progressing 10/26/2021 0428 by Raylin Diguglielmo, Crist Fat, RN Outcome: Progressing   Problem: Skin Integrity: Goal: Risk for impaired skin integrity will decrease 10/26/2021 0459 by Onnie Hatchel, Crist Fat, RN Outcome: Progressing 10/26/2021 0428 by Josecarlos Harriott, Crist Fat, RN Outcome: Progressing

## 2021-10-26 NOTE — Evaluation (Addendum)
Occupational Therapy Re-Evaluation Patient Details Name: Jacob [name] MRN: [id number] DOB: April 17, 1928 Today's Date: 10/26/2021   History of Present Illness Pt is a 88 yo male that presented to ED for low back pain. noted for L4 compression fracture. S/p kyphoplasty of L4 10/24/21. Past medical history significant for GERD, diverticulitis, HTN, HDL, renal cancer, and prostate cancer, previous lumbar fractures, COPD, LBBB, pacemaker.   Clinical Impression   Chart reviewed, pt greeted in bed requesting to utilize commode. Re-evaluation completed, session targeted improving activity tolerance via ADL task completion. Improvements noted in bed mobility, STS, short amb transfers however pt continues to require increased assistance as compared to baseline. Jerky movements noted throughout BUE while seated on edge of bed, RN Clarene Critchley in room to assess. Pt endorses this has not happened before. Pt is left in care of PT, all needs met. OT will continue to follow acutely.      Recommendations for follow up therapy are one component of a multi-disciplinary discharge planning process, led by the attending physician.  Recommendations may be updated based on patient status, additional functional criteria and insurance authorization.   Follow Up Recommendations  Skilled nursing-short term rehab (<3 hours/day)    Assistance Recommended at Discharge Frequent or constant Supervision/Assistance  Patient can return home with the following A lot of help with walking and/or transfers;A lot of help with bathing/dressing/bathroom;Assistance with cooking/housework;Help with stairs or ramp for entrance;Assist for transportation    Functional Status Assessment  Patient has had a recent decline in their functional status and demonstrates the ability to make significant improvements in function in a reasonable and predictable amount of time.  Equipment Recommendations  None recommended by OT    Recommendations for  Other Services       Precautions / Restrictions Precautions Precautions: Fall Restrictions Weight Bearing Restrictions: No      Mobility Bed Mobility Overal bed mobility: Needs Assistance Bed Mobility: Sidelying to Sit   Sidelying to sit: Mod assist            Transfers Overall transfer level: Needs assistance Equipment used: Rolling walker (2 wheels) Transfers: Sit to/from Stand Sit to Stand: +2 safety/equipment, Min assist                  Balance Overall balance assessment: Needs assistance Sitting-balance support: Feet supported, Bilateral upper extremity supported Sitting balance-Leahy Scale: Fair     Standing balance support: Bilateral upper extremity supported, Reliant on assistive device for balance Standing balance-Leahy Scale: Fair                             ADL either performed or assessed with clinical judgement   ADL Overall ADL's : Needs assistance/impaired                 Upper Body Dressing : Moderate assistance;Sitting       Toilet Transfer: Min guard;BSC/3in1;Rolling walker (2 wheels) Toilet Transfer Details (indicate cue type and reason): short, shuffled steps, vcs for technique Toileting- Clothing Manipulation and Hygiene: Maximal assistance;Sit to/from stand Toileting - Clothing Manipulation Details (indicate cue type and reason): peri care             Vision Patient Visual Report: No change from baseline       Perception     Praxis      Pertinent Vitals/Pain Pain Assessment Pain Assessment: Faces Faces Pain Scale: Hurts little more Pain Location: lower back Pain Descriptors /  Indicators: Aching, Constant, Guarding, Grimacing, Shooting, Sharp Pain Intervention(s): Limited activity within patient's tolerance, Monitored during session, Repositioned     Hand Dominance     Extremity/Trunk Assessment Upper Extremity Assessment Upper Extremity Assessment: Generalized weakness   Lower Extremity  Assessment Lower Extremity Assessment: Generalized weakness       Communication Communication Communication: No difficulties   Cognition Arousal/Alertness: Awake/alert Behavior During Therapy: WFL for tasks assessed/performed Overall Cognitive Status: Within Functional Limits for tasks assessed                                       General Comments       Exercises     Shoulder Instructions      Home Living Family/patient expects to be discharged to:: Private residence Living Arrangements: Alone Available Help at Discharge: Friend(s);Available PRN/intermittently Type of Home: House Home Access: Stairs to enter [employer] of Steps: 2   Home Layout: One level     Bathroom Shower/Tub: Teacher, early years/pre: Handicapped height     Home Equipment: Grab bars - tub/shower;Cane - quad;Tub bench;Toilet riser;Rollator (4 wheels);Hand held shower head;Rolling Walker (2 wheels)   Additional Comments: info from chart review      Prior Functioning/Environment Prior Level of Function : Independent/Modified Independent                        OT Problem List: Decreased activity tolerance;Decreased range of motion;Decreased strength;Impaired balance (sitting and/or standing);Pain      OT Treatment/Interventions: Self-care/ADL training;Therapeutic exercise;Patient/family education;Balance training;Energy conservation;Therapeutic activities;DME and/or AE instruction    OT Goals(Current goals can be found in the care plan section)    OT Frequency: Min 2X/week    Co-evaluation              AM-PAC OT "6 Clicks" Daily Activity     Outcome Measure Help from another person eating meals?: None Help from another person taking care of personal grooming?: A Little Help from another person toileting, which includes using toliet, bedpan, or urinal?: A Lot Help from another person bathing (including washing, rinsing, drying)?: A  Lot Help from another person to put on and taking off regular upper body clothing?: A Little Help from another person to put on and taking off regular lower body clothing?: A Lot 6 Click Score: 16   End of Session Equipment Utilized During Treatment: Rolling walker (2 wheels) Nurse Communication: Mobility status  Activity Tolerance: Patient tolerated treatment well Patient left: in chair;with call bell/phone within reach;Other (comment) (with PT present)  OT Visit Diagnosis: Unsteadiness on feet (R26.81);Muscle weakness (generalized) (M62.81);Pain                Time: 2683-4196 OT Time Calculation (min): 28 min Charges:  OT General Charges $OT Visit: 1 Visit OT Treatments $Self Care/Home Management : 23-37 mins  Shanon Payor, OTD OTR/L  10/26/21, 1:59 PM

## 2021-10-27 DIAGNOSIS — R52 Pain, unspecified: Secondary | ICD-10-CM | POA: Diagnosis not present

## 2021-10-27 DIAGNOSIS — S32040A Wedge compression fracture of fourth lumbar vertebra, initial encounter for closed fracture: Secondary | ICD-10-CM | POA: Diagnosis not present

## 2021-10-27 MED ORDER — ACETAMINOPHEN 325 MG PO TABS
650.0000 mg | ORAL_TABLET | Freq: Four times a day (QID) | ORAL | Status: DC
  Administered 2021-10-27 – 2021-10-28 (×5): 650 mg via ORAL
  Filled 2021-10-27 (×5): qty 2

## 2021-10-27 MED ORDER — ACETAMINOPHEN 650 MG RE SUPP: 650.0000 mg | Freq: Four times a day (QID) | RECTAL | Status: DC

## 2021-10-27 NOTE — Progress Notes (Signed)
Physical Therapy Treatment Patient Details Name: Jacob [name] MRN: [id number] DOB: 1928-05-12 Today's Date: 10/27/2021   History of Present Illness Pt is a 88 yo male that presented to ED for low back pain. noted for L4 compression fracture. S/p kyphoplasty of L4 10/24/21. Past medical history significant for GERD, diverticulitis, HTN, HDL, renal cancer, and prostate cancer, previous lumbar fractures, COPD, LBBB, pacemaker.    PT Comments    Patient alert, up in recliner at start of session, demonstrated mild pain signs/symptoms this session. Pt still experiencing some jerky movements of RUE today after use of incentive spirometer, RN notified. The patient still required minA for sit <> stand with RW, twice during session. He ambulated ~21ft twice with seated rest breaks between bouts. Pt complained of significant dizziness after second bout, spO2 reading 84% (questionable pleth) but placed on 3L and RN notified. BP also 137/51 after several minutes of supported sitting. Pt may benefit from orthostatic hypotension assessment. The patient would benefit from further skilled PT intervention to continue to progress towards goals. Recommendation remains appropriate.     Recommendations for follow up therapy are one component of a multi-disciplinary discharge planning process, led by the attending physician.  Recommendations may be updated based on patient status, additional functional criteria and insurance authorization.  Follow Up Recommendations  Skilled nursing-short term rehab (<3 hours/day) Can patient physically be transported by private vehicle: No   Assistance Recommended at Discharge Frequent or constant Supervision/Assistance  Patient can return home with the following Two people to help with walking and/or transfers;Two people to help with bathing/dressing/bathroom;Help with stairs or ramp for entrance;Assist for transportation;Direct supervision/assist for medications  management;Assistance with cooking/housework   Equipment Recommendations  Other (comment) (TBD)    Recommendations for Other Services       Precautions / Restrictions Precautions Precautions: Fall Required Braces or Orthoses:  (LSO for comfort) Restrictions Weight Bearing Restrictions: No     Mobility  Bed Mobility               General bed mobility comments: sitting up in recliner at start of session/end of session    Transfers Overall transfer level: Needs assistance Equipment used: Rolling walker (2 wheels) Transfers: Sit to/from Stand Sit to Stand: Min assist                Ambulation/Gait Ambulation/Gait assistance: Min guard Gait Distance (Feet):  (40ft, twice) Assistive device: Rolling walker (2 wheels)         General Gait Details: reported dizziness after second bout, vitals assessed.deferred more mobility at this time. did increase step length with cueing   Stairs             Wheelchair Mobility    Modified Rankin (Stroke Patients Only)       Balance Overall balance assessment: Needs assistance Sitting-balance support: Feet supported, Bilateral upper extremity supported Sitting balance-Leahy Scale: Fair     Standing balance support: Reliant on assistive device for balance Standing balance-Leahy Scale: Poor                              Cognition Arousal/Alertness: Awake/alert Behavior During Therapy: WFL for tasks assessed/performed, Anxious Overall Cognitive Status: Within Functional Limits for tasks assessed  Exercises      General Comments        Pertinent Vitals/Pain Pain Assessment Pain Assessment: Faces Faces Pain Scale: Hurts a little bit Pain Location: lower back Pain Descriptors / Indicators: Aching, Constant, Guarding, Grimacing, Shooting, Sharp Pain Intervention(s): Limited activity within patient's tolerance, Monitored during  session, Repositioned, Premedicated before session    Home Living                          Prior Function            PT Goals (current goals can now be found in the care plan section) Progress towards PT goals: Progressing toward goals    Frequency    7X/week      PT Plan Current plan remains appropriate    Co-evaluation              AM-PAC PT "6 Clicks" Mobility   Outcome Measure  Help needed turning from your back to your side while in a flat bed without using bedrails?: A Lot Help needed moving from lying on your back to sitting on the side of a flat bed without using bedrails?: A Lot Help needed moving to and from a bed to a chair (including a wheelchair)?: A Lot Help needed standing up from a chair using your arms (e.g., wheelchair or bedside chair)?: A Lot Help needed to walk in hospital room?: A Lot Help needed climbing 3-5 steps with a railing? : Total 6 Click Score: 11    End of Session Equipment Utilized During Treatment:  (LSO) Activity Tolerance: Patient limited by fatigue Patient left: in chair;with chair alarm set;with call bell/phone within reach Nurse Communication: Mobility status PT Visit Diagnosis: Other abnormalities of gait and mobility (R26.89);History of falling (Z91.81);Muscle weakness (generalized) (M62.81)     Time: 1540-0867 PT Time Calculation (min) (ACUTE ONLY): 18 min  Charges:  $Therapeutic Activity: 8-22 mins                     Lieutenant Diego PT, DPT 11:58 AM,10/27/21

## 2021-10-27 NOTE — Care Management Important Message (Signed)
Important Message  Patient Details  Name: Jacob [name] MRN: [id number] Date of Birth: [birth year]   Medicare Important Message Given:  Yes     Dannette Barbara 10/27/2021, 2:56 PM

## 2021-10-27 NOTE — TOC Progression Note (Signed)
Transition of Care [employer] [facility]) - Progression Note    Patient Details  Name: Jacob [name] MRN: [id number] Date of Birth: October 01, 1928  [facility] CM/SW Contact  Laurena Slimmer, RN Phone Number: 10/27/2021, 4:19 PM  Clinical Narrative:    Attempt to reach patient to discuss discharge plan. No answer.         Expected Discharge Plan and Services                                                 Social Determinants of Health (SDOH) Interventions    Readmission Risk Interventions     No data to display

## 2021-10-27 NOTE — Plan of Care (Signed)
  Problem: Education: Goal: Knowledge of General Education information will improve Description: Including pain rating scale, medication(s)/side effects and non-pharmacologic comfort measures Outcome: Progressing   Problem: Health Behavior/Discharge Planning: Goal: Ability to manage health-related needs will improve Outcome: Progressing   Problem: Clinical Measurements: Goal: Ability to maintain clinical measurements within normal limits will improve Outcome: Progressing Goal: Will remain free from infection Outcome: Progressing Goal: Diagnostic test results will improve Outcome: Progressing Goal: Respiratory complications will improve Outcome: Progressing Goal: Cardiovascular complication will be avoided Outcome: Progressing   Problem: Activity: Goal: Risk for activity intolerance will decrease Outcome: Progressing   Problem: Nutrition: Goal: Adequate nutrition will be maintained Outcome: Progressing   Problem: Coping: Goal: Level of anxiety will decrease Outcome: Progressing   Problem: Elimination: Goal: Will not experience complications related to bowel motility Outcome: Progressing   Problem: Pain Managment: Goal: General experience of comfort will improve Outcome: Progressing   

## 2021-10-27 NOTE — Progress Notes (Signed)
Progress Note    Jacob Jacob [name]  ERX:[id number] DOB: 09-29-1928  DOA: 10/18/2021 PCP: Jacob Carls, MD      Brief Narrative:    Medical records reviewed and are as summarized below:  Jacob Jacob [name] is Jacob [name] 88 y.o. male with history of L1 compression fracture, BPH, bladder tumor, status post TURPT, urocystoscopy, ureteral tumor biopsy, atrial fibrillation, hypertension, GERD, history of diverticulosis with diverticulitis, renal cell carcinoma status post left nephroureterectomy in August 2015, hyperlipidemia, Mobitz type II AV block, status postcardiac pacemaker placement in 2015, who presents emergency department for chief concerns of low back pain.   Pursued CT abdomen pelvis given concern for possible lytic lesions of metastatic disease.  Study limited by lack of IV contrast however no metastatic disease noted.  There was L4 compression fracture with 60 to 70% height loss.  Kyphoplasty was performed by IR on 10/24/2021.   Also with AKI on CKD stage IV.  Nephrology consult requested. Interventional radiology has approved patient for kyphoplasty.  Insurance approval obtained.           Assessment/Plan:   Principal Problem:   Inadequate pain control Active Problems:   Hyperlipidemia   Left bundle branch block   Urothelial carcinoma of kidney (HCC)   HTN (hypertension)   Renal cancer, left (HCC)   Mobitz type 2 second degree AV block   COPD (chronic obstructive pulmonary disease) (HCC)   GERD (gastroesophageal reflux disease)   Stage 3a chronic kidney disease (CKD) (HCC)   Type 2 diabetes mellitus without complication, without long-term current use of insulin (HCC)   Closed compression fracture of L4 lumbar vertebra, initial encounter (HCC)   Hyperkalemia   AKI (acute kidney injury) (Jacob Jacob [name])   Heart failure with preserved ejection fraction (HCC)   Dysuria   Lumbar radiculopathy   Body mass index is 25.1 kg/m.   Intractable back pain L4 compression  fracture S/p kyphoplasty on 10/24/2021.  Analgesics as needed for pain. -Neurosurgery recommends LSO brace when upright. - Poor tolerance of narcotic Start scheduled Tylenol for pain control -Status post kyphoplasty 10/23  Constipation Improved.  He is moving his bowels regularly.  Use laxatives as needed.   AKI on CKD stage IV Patient was prescribed Bactrim prior to cystoscopy.  This has been held.  He is established with Jacob Jacob [name] for outpatient nephrology. Creatinine is stable.   Dysuria Unclear etiology.  Unable to exclude urinary tract infection at this time.  Could be secondary to recent bladder instrumentation Procalcitonin 0.24 Completed 5 days of IV ceftriaxone  Hydrocortisone cream prescribed for itchy lower back on 10/26/2021   Other comorbidities include GERD, hypertension, hyperlipidemia    Diet Order             Diet heart healthy/carb modified Room service appropriate? Yes; Fluid consistency: Thin  Diet effective now                            Consultants: Neurosurgeon Interventional radiologist Nephrologist  Procedures: Kyphoplasty on 10/24/2021    Medications:    acetaminophen  650 mg Oral QID   Or   acetaminophen  650 mg Rectal QID   amLODipine  5 mg Oral Daily   atorvastatin  40 mg Oral Daily   diclofenac Sodium  4 g Topical QID   fluticasone furoate-vilanterol  1 puff Inhalation Daily   And   umeclidinium bromide  1 puff Inhalation Daily   gabapentin  300 mg Oral BID   heparin  5,000 Units Subcutaneous Q8H   hydrocortisone cream   Topical BID   polyethylene glycol  17 g Oral Daily   senna-docusate  1 tablet Oral BID   Continuous Infusions:   Anti-infectives (From admission, onward)    Start     Dose/Rate Route Frequency Ordered Stop   10/24/21 1253  vancomycin (VANCOREADY) IVPB 500 mg/100 mL        over 60 Minutes  Continuous PRN 10/24/21 1254 10/24/21 1253   10/24/21 1244  vancomycin (VANCOCIN) 1-5  GM/200ML-% IVPB       Note to Pharmacy: Jacob Jacob [name], Jacob Jacob [name]: cabinet override      10/24/21 1244 10/25/21 0059   10/24/21 0800  vancomycin (VANCOCIN) IVPB 1000 mg/200 mL premix       Note to Pharmacy: On call to radiology for procedure   1,000 mg 200 mL/hr over 60 Minutes Intravenous On call 10/21/21 1658 10/25/21 0800   10/18/21 2345  cefTRIAXone (ROCEPHIN) 1 g in sodium chloride 0.9 % 100 mL IVPB        1 g 200 mL/hr over 30 Minutes Intravenous Every 24 hours 10/18/21 2251 10/22/21 2209              Family Communication/Anticipated D/C date and plan/Code Status   DVT prophylaxis: heparin injection 5,000 Units Start: 10/18/21 2200 Place TED hose Start: 10/18/21 2037     Code Status: Full Code  Family Communication: None Disposition Plan: Plan to discharge to SNF   Status is: Inpatient Remains inpatient appropriate because: Awaiting placement to SNF       Subjective:   Interval events noted.  He feels better today.  Objective:    Vitals:   10/27/21 0529 10/27/21 0804 10/27/21 1108 10/27/21 1158  BP: (!) 131/47 (!) 133/51  (!) 133/1  Pulse: 62 (!) 59  (!) 59  Resp: 17 16  16   Temp: 98.9 F (37.2 C) (!) 97.5 F (36.4 C)  (!) 97.5 F (36.4 C)  TempSrc:   Oral   SpO2: 95% 96%    Weight:      Height:    5\' 9"  (1.753 m)   No data found.   Intake/Output Summary (Last 24 hours) at 10/27/2021 1317 Last data filed at 10/27/2021 0500 Gross per 24 hour  Intake --  Output 750 ml  Net -750 ml   Filed Weights   10/18/21 1647  Weight: 77.1 kg    Exam:  GEN: NAD SKIN: Warm and dry EYES: EOMI ENT: MMM CV: RRR PULM: CTA B ABD: soft, ND, NT, +BS CNS: AAO x 3, non focal EXT: No edema or tenderness       Data Reviewed:   I have personally reviewed following labs and imaging studies:  Labs: Labs show the following:   Basic Metabolic Panel: Recent Labs  Lab 10/21/21 1030 10/24/21 0545  NA 137 138  K 5.1 4.9  CL 109 110  CO2 20* 23   GLUCOSE 139* 111*  BUN 62* 46*  CREATININE 2.18* 1.65*  CALCIUM 8.5* 9.1   GFR Estimated Creatinine Clearance: 29.2 mL/min (Jacob [name]) (by C-G formula based on SCr of 1.65 mg/dL (H)). Liver Function Tests: No results for input(s): "AST", "ALT", "ALKPHOS", "BILITOT", "PROT", "ALBUMIN" in the last 168 hours.  No results for input(s): "LIPASE", "AMYLASE" in the last 168 hours. No results for input(s): "AMMONIA" in the last 168 hours. Coagulation profile Recent Labs  Lab 10/24/21 0545  INR 1.1  CBC: Recent Labs  Lab 10/24/21 0545  WBC 3.4*  NEUTROABS 1.8  HGB 9.5*  HCT 29.4*  MCV 95.1  PLT 131*   Cardiac Enzymes: No results for input(s): "CKTOTAL", "CKMB", "CKMBINDEX", "TROPONINI" in the last 168 hours. BNP (last 3 results) No results for input(s): "PROBNP" in the last 8760 hours. CBG: Recent Labs  Lab 10/21/21 0744  GLUCAP 125*   D-Dimer: No results for input(s): "DDIMER" in the last 72 hours. Hgb A1c: No results for input(s): "HGBA1C" in the last 72 hours. Lipid Profile: No results for input(s): "CHOL", "HDL", "LDLCALC", "TRIG", "CHOLHDL", "LDLDIRECT" in the last 72 hours. Thyroid function studies: No results for input(s): "TSH", "T4TOTAL", "T3FREE", "THYROIDAB" in the last 72 hours.  Invalid input(s): "FREET3" Anemia work up: No results for input(s): "VITAMINB12", "FOLATE", "FERRITIN", "TIBC", "IRON", "RETICCTPCT" in the last 72 hours. Sepsis Labs: Recent Labs  Lab 10/24/21 0545  WBC 3.4*    Microbiology No results found for this or any previous visit (from the past 240 hour(s)).  Procedures and diagnostic studies:  No results found.             LOS: 7 days   Jacob Jacob [name]  [facility]   Pager on [email or web address]. If 7PM-7AM, please contact night-coverage at [email or web address]     10/27/2021, 1:17 PM

## 2021-10-28 ENCOUNTER — Inpatient Hospital Stay: Payer: Medicare Other

## 2021-10-28 DIAGNOSIS — N179 Acute kidney failure, unspecified: Secondary | ICD-10-CM

## 2021-10-28 DIAGNOSIS — S32040A Wedge compression fracture of fourth lumbar vertebra, initial encounter for closed fracture: Secondary | ICD-10-CM | POA: Diagnosis not present

## 2021-10-28 DIAGNOSIS — I951 Orthostatic hypotension: Secondary | ICD-10-CM

## 2021-10-28 DIAGNOSIS — R52 Pain, unspecified: Secondary | ICD-10-CM | POA: Diagnosis not present

## 2021-10-28 HISTORY — DX: Orthostatic hypotension: I95.1

## 2021-10-28 LAB — CBC
HCT: 28.6 % — ABNORMAL LOW (ref 39.0–52.0)
Hemoglobin: 9.1 g/dL — ABNORMAL LOW (ref 13.0–17.0)
MCH: 31.1 pg (ref 26.0–34.0)
MCHC: 31.8 g/dL (ref 30.0–36.0)
MCV: 97.6 fL (ref 80.0–100.0)
Platelets: 206 10*3/uL (ref 150–400)
RBC: 2.93 MIL/uL — ABNORMAL LOW (ref 4.22–5.81)
RDW: 13.2 % (ref 11.5–15.5)
WBC: 5.9 10*3/uL (ref 4.0–10.5)
nRBC: 0 % (ref 0.0–0.2)

## 2021-10-28 LAB — BASIC METABOLIC PANEL
Anion gap: 7 (ref 5–15)
BUN: 71 mg/dL — ABNORMAL HIGH (ref 8–23)
CO2: 24 mmol/L (ref 22–32)
Calcium: 9.1 mg/dL (ref 8.9–10.3)
Chloride: 107 mmol/L (ref 98–111)
Creatinine, Ser: 1.94 mg/dL — ABNORMAL HIGH (ref 0.61–1.24)
GFR, Estimated: 32 mL/min — ABNORMAL LOW (ref >60)
Glucose, Bld: 113 mg/dL — ABNORMAL HIGH (ref 70–99)
Potassium: 5 mmol/L (ref 3.5–5.1)
Sodium: 138 mmol/L (ref 135–145)

## 2021-10-28 MED ORDER — AMLODIPINE BESYLATE 2.5 MG PO TABS: 5.0000 mg | ORAL_TABLET | Freq: Every day | ORAL | Status: DC

## 2021-10-28 MED ORDER — GUAIFENESIN 100 MG/5ML PO LIQD
5.0000 mL | Freq: Four times a day (QID) | ORAL | Status: DC | PRN
  Administered 2021-10-28: 5 mL via ORAL
  Filled 2021-10-28: qty 5
  Filled 2021-10-28: qty 10

## 2021-10-28 MED ORDER — GUAIFENESIN 100 MG/5ML PO LIQD: 5.0000 mL | Freq: Four times a day (QID) | ORAL | 0 refills | Status: AC | PRN

## 2021-10-28 NOTE — Progress Notes (Signed)
Physical Therapy Treatment Patient Details Name: Jacob [name] MRN: [id number] DOB: July 07, 1928 Today's Date: 10/28/2021   History of Present Illness Pt is a 88 yo male that presented to ED for low back pain. noted for L4 compression fracture. S/p kyphoplasty of L4 10/24/21. Past medical history significant for GERD, diverticulitis, HTN, HDL, renal cancer, and prostate cancer, previous lumbar fractures, COPD, LBBB, pacemaker.    PT Comments    Patient in bed, seen with OT for a co-treat to maximize function and mobility. Pt reported 4/10 in low back during session. Pt required modAx2 to attempt log roll to EOB. Sit <> stand from EOB, and recliner <> BSC during session, minAx2 for safety (pt often relies on RW to come up into standing). Pt also tolerated 1-2 minutes in standing for orthostatic hypotension (positive see vitals section, RN notified). Pt also required maxA for pericare in standing. The patient would benefit from further skilled PT intervention to continue to progress towards goals. Recommendation remains appropriate.   Pt on 2L via [place] throughout session, spO2 >90%.     Recommendations for follow up therapy are one component of a multi-disciplinary discharge planning process, led by the attending physician.  Recommendations may be updated based on patient status, additional functional criteria and insurance authorization.  Follow Up Recommendations  Skilled nursing-short term rehab (<3 hours/day) Can patient physically be transported by private vehicle: No   Assistance Recommended at Discharge Frequent or constant Supervision/Assistance  Patient can return home with the following Two people to help with walking and/or transfers;Two people to help with bathing/dressing/bathroom;Help with stairs or ramp for entrance;Assist for transportation;Direct supervision/assist for medications management;Assistance with cooking/housework   Equipment Recommendations  Other (comment) (TBD)     Recommendations for Other Services       Precautions / Restrictions Precautions Precautions: Fall Restrictions Weight Bearing Restrictions: No     Mobility  Bed Mobility Overal bed mobility: Needs Assistance Bed Mobility: Rolling, Sidelying to Sit Rolling: Mod assist, +2 for physical assistance Sidelying to sit: Mod assist, +2 for physical assistance       General bed mobility comments: attempted to educate on log roll technique    Transfers Overall transfer level: Needs assistance Equipment used: Rolling walker (2 wheels) Transfers: Sit to/from Stand Sit to Stand: Min assist, From elevated surface, +2 safety/equipment   Step pivot transfers: Min guard, +2 safety/equipment       General transfer comment: very small, shuffled steps. OT stabilized RW for transfer from EOB, minA from recliner and [facility]    Ambulation/Gait               General Gait Details: deferred due to orthostatic vitals   Stairs             Wheelchair Mobility    Modified Rankin (Stroke Patients Only)       Balance Overall balance assessment: Needs assistance Sitting-balance support: Feet supported Sitting balance-Leahy Scale: Fair     Standing balance support: Reliant on assistive device for balance Standing balance-Leahy Scale: Poor                              Cognition Arousal/Alertness: Awake/alert Behavior During Therapy: WFL for tasks assessed/performed Overall Cognitive Status: Within Functional Limits for tasks assessed  Exercises Other Exercises Other Exercises: able to stand for a 1-2 minutes for BP assessment. step pivot to recliner <> BSC. maxA for pericare    General Comments        Pertinent Vitals/Pain Pain Assessment Pain Assessment: 0-10 Pain Score: 4  Pain Location: lower back Pain Descriptors / Indicators: Aching Pain Intervention(s): Limited activity within patient's  tolerance, Monitored during session, Repositioned    Home Living                          Prior Function            PT Goals (current goals can now be found in the care plan section) Progress towards PT goals: Progressing toward goals    Frequency    7X/week      PT Plan Current plan remains appropriate    Co-evaluation PT/OT/SLP Co-Evaluation/Treatment: Yes Reason for Co-Treatment: For patient/therapist safety;To address functional/ADL transfers PT goals addressed during session: Mobility/safety with mobility;Balance;Proper use of DME OT goals addressed during session: ADL's and self-care;Proper use of Adaptive equipment and DME      AM-PAC PT "6 Clicks" Mobility   Outcome Measure  Help needed turning from your back to your side while in a flat bed without using bedrails?: A Lot Help needed moving from lying on your back to sitting on the side of a flat bed without using bedrails?: A Lot Help needed moving to and from a bed to a chair (including a wheelchair)?: A Lot Help needed standing up from a chair using your arms (e.g., wheelchair or bedside chair)?: A Lot Help needed to walk in hospital room?: A Lot Help needed climbing 3-5 steps with a railing? : Total 6 Click Score: 11    End of Session Equipment Utilized During Treatment: Oxygen (LSO, 2L) Activity Tolerance: Patient limited by fatigue;Other (comment) (low BP) Patient left: in chair;with chair alarm set;with call bell/phone within reach Nurse Communication: Mobility status;Other (comment) (BP) PT Visit Diagnosis: Other abnormalities of gait and mobility (R26.89);History of falling (Z91.81);Muscle weakness (generalized) (M62.81)     Time: 5456-2563 PT Time Calculation (min) (ACUTE ONLY): 26 min  Charges:  $Therapeutic Activity: 8-22 mins                     Lieutenant Diego PT, DPT 11:50 AM,10/28/21

## 2021-10-28 NOTE — NC FL2 (Addendum)
[place] LEVEL OF CARE SCREENING TOOL     IDENTIFICATION  Patient Name: Jacob [name] Birthdate: 07/10/1928 Sex: male Admission Date (Current Location): 10/18/2021  [facility] and [place] Number:  Engineering geologist and Address:  [facility], [address] [place], [place] [address]      Provider Number: [id number]  Attending Physician Name and Address:  Jennye Boroughs, MD  Relative Name and Phone Number:  Xzavien Harada, [phone]    Current Level of Care: Hospital Recommended Level of Care: [place] Prior Approval Number:    Date Approved/Denied:   PASRR Number: [id number]  Discharge Plan: SNF    Current Diagnoses: Patient Active Problem List   Diagnosis Date Noted   Lumbar radiculopathy 10/20/2021   Inadequate pain control 10/18/2021   Heart failure with preserved ejection fraction ([place]) 10/18/2021   Dysuria 10/18/2021   AKI (acute kidney injury) ([place]) 01/30/2021   CKD stage 4 secondary to hypertension ([place]) 01/26/2021   Closed compression fracture of L4 lumbar vertebra, initial encounter ([place]) 01/26/2021   Hyperkalemia 01/26/2021   Constipation 01/25/2021   Back pain 01/25/2021   Type 2 diabetes mellitus without complication, without long-term current use of insulin ([place]) 08/29/2017   History of bladder cancer 05/09/2016   Nodule of left lung 05/07/2016   COPD (chronic obstructive pulmonary disease) ([place]) 03/28/2015   Bladder carcinoma ([place]) 03/28/2015   Acute and chronic respiratory failure with hypoxia ([place]) 03/28/2015   GERD (gastroesophageal reflux disease) 03/28/2015   Pacemaker 12/01/2013   Mobitz type 2 second degree AV block 12/01/2013   Acquired solitary kidney 10/27/2013   Stage 3a chronic kidney disease (CKD) ([place]) 10/27/2013   Personal history of renal cell cancer 10/27/2013   Bradycardia 08/28/2013   Symptomatic bradycardia 08/28/2013   Sinus bradycardia    Renal insufficiency     HTN (hypertension)    Renal cancer, left (HCC)    Urothelial carcinoma of kidney ([place]) 08/22/2013   Left bundle branch block 02/14/2013   CAD (coronary artery disease)    Hyperlipidemia     Orientation RESPIRATION BLADDER Height & Weight     Self, Time, Situation, Place  O2 (022L/ [place]) External catheter Weight: 77.1 kg Height:  5\' 9"  (175.3 cm)  BEHAVIORAL SYMPTOMS/MOOD NEUROLOGICAL BOWEL NUTRITION STATUS  Other (Comment) (n/a)   Continent Diet  Heart Healthy/Carb modified  AMBULATORY STATUS COMMUNICATION OF NEEDS Skin Ecchymosis Eyrthema buttocks      Verbally                         Personal Care Assistance Level of Assistance   Bathing Dressing  Needs assistance  Needs assistance       Functional Limitations Info             SPECIAL CARE FACTORS FREQUENCY  PT (By licensed PT), OT (By licensed OT)     PT Frequency: 2xmin weekly OT Frequency: 2xmin weeklyf            Contractures Contractures Info: Not present    Additional Factors Info  Code Status, Allergies Code Status Info: FULL Allergies Info: Penicillins           Current Medications (10/28/2021):  This is the current hospital active medication list Current Facility-Administered Medications  Medication Dose Route Frequency Provider Last Rate Last Admin   acetaminophen (TYLENOL) tablet 650 mg  650 mg Oral QID Jennye Boroughs, MD   650 mg at 10/28/21  0623   Or   acetaminophen (TYLENOL) suppository 650 mg  650 mg Rectal QID Jennye Boroughs, MD       albuterol (PROVENTIL) (2.5 MG/3ML) 0.083% nebulizer solution 2.5 mg  2.5 mg Nebulization Q4H PRN Renda Rolls, RPH       amLODipine (NORVASC) tablet 5 mg  5 mg Oral Daily Cox, Amy N, DO   5 mg at 10/27/21 7628   atorvastatin (LIPITOR) tablet 40 mg  40 mg Oral Daily Cox, Amy N, DO   40 mg at 10/27/21 3151   bisacodyl (DULCOLAX) suppository 10 mg  10 mg Rectal Daily PRN Ralene Muskrat B, MD       diclofenac Sodium (VOLTAREN) 1 % topical gel 4  g  4 g Topical QID Priscella Mann, Sudheer B, MD   4 g at 10/27/21 2146   diphenhydrAMINE (BENADRYL) injection 12.5 mg  12.5 mg Intravenous Q6H PRN Cox, Amy N, DO       fluticasone furoate-vilanterol (BREO ELLIPTA) 200-25 MCG/ACT 1 puff  1 puff Inhalation Daily Renda Rolls, RPH   1 puff at 10/27/21 0907   And   umeclidinium bromide (INCRUSE ELLIPTA) 62.5 MCG/ACT 1 puff  1 puff Inhalation Daily Renda Rolls, RPH   1 puff at 10/27/21 0907   gabapentin (NEURONTIN) tablet 300 mg  300 mg Oral BID Ralene Muskrat B, MD   300 mg at 10/27/21 2145   heparin injection 5,000 Units  5,000 Units Subcutaneous Q8H Cox, Amy N, DO   5,000 Units at 10/28/21 7616   hydrocortisone cream 0.5 %   Topical BID Jennye Boroughs, MD   Given at 10/27/21 2145   HYDROmorphone (DILAUDID) injection 0.5 mg  0.5 mg Intravenous Q3H PRN Ralene Muskrat B, MD       oxybutynin (DITROPAN) tablet 5 mg  5 mg Oral Q8H PRN Cox, Amy N, DO       oxyCODONE (Oxy IR/ROXICODONE) immediate release tablet 5-10 mg  5-10 mg Oral Q4H PRN Priscella Mann, Sudheer B, MD   5 mg at 10/27/21 0909   pantoprazole (PROTONIX) EC tablet 80 mg  80 mg Oral Daily PRN Cox, Amy N, DO       polyethylene glycol (MIRALAX / GLYCOLAX) packet 17 g  17 g Oral Daily Ralene Muskrat B, MD   17 g at 10/27/21 0737   senna-docusate (Senokot-S) tablet 1 tablet  1 tablet Oral BID Ralene Muskrat B, MD   1 tablet at 10/27/21 2145     Discharge Medications: Please see discharge summary for a list of discharge medications.  Relevant Imaging Results:  Relevant Lab Results:   Additional Information SS# [id number]  Laurena Slimmer, RN

## 2021-10-28 NOTE — TOC Transition Note (Addendum)
Transition of Care [facility]) - CM/SW Discharge Note   Patient Details  Name: [name] MRN: [id number] Date of Birth: 1928-05-08  [facility] CM/SW Contact:  Magnus Ivan, LCSW Phone Number: 10/28/2021, 3:45 PM   Clinical Narrative:     [facility]- spoke with Rep Cindy who updated admission date today 10/27 on auth.  Discharge to [facility] in [place] today. Room E10. Confirmed with Admissions Worker Ricky. Updated MD, RN, and patient/family. Asked RN to call report after DC Summary is completed. EMS paperwork completed. Will call for [facility] when RN is ready.   4:39- Confirmed RN is ready for EMS transport.  Called [facility], patient is 4th on the list. RN notified.     Final next level of care: Skilled Nursing Facility Barriers to Discharge: Barriers Resolved   Patient Goals and CMS Choice Patient states their goals for this hospitalization and ongoing recovery are:: SNF CMS Medicare.gov Compare Post Acute Care list provided to:: Patient Choice offered to / list presented to : Patient, Adult Children  Discharge Placement              Patient chooses bed at: Other - please specify in the comment section below: ([facility] [place]) Patient to be transferred to facility by: [facility] Name of family member notified: son [name] Patient and family notified of of transfer: 10/28/21  Discharge Plan and Services                                     Social Determinants of Health (SDOH) Interventions     Readmission Risk Interventions     No data to display

## 2021-10-28 NOTE — Discharge Summary (Addendum)
Physician Discharge Summary   Patient: Jacob [name] MRN: [id number] DOB: 12-Jan-1928  Admit date:     10/18/2021  Discharge date: 10/28/21  Discharge Physician: Jennye Boroughs   PCP: [name], MD   Recommendations at discharge:   Follow-up with physician at the nursing home within 3 days of discharge  Discharge Diagnoses: Principal Problem:   Inadequate pain control Active Problems:   Hyperlipidemia   Left bundle branch block   Urothelial carcinoma of kidney (HCC)   HTN (hypertension)   Renal cancer, left (HCC)   Mobitz type 2 second degree AV block   COPD (chronic obstructive pulmonary disease) (HCC)   GERD (gastroesophageal reflux disease)   Stage 3a chronic kidney disease (CKD) ([place])   Type 2 diabetes mellitus without complication, without long-term current use of insulin (HCC)   Closed compression fracture of L4 lumbar vertebra, initial encounter (HCC)   Hyperkalemia   AKI (acute kidney injury) ([place])   Heart failure with preserved ejection fraction (HCC)   Dysuria   Lumbar radiculopathy   Orthostatic hypotension  Resolved Problems:   * No resolved hospital problems. [facility] Hospital Course:  Jacob [name] is a 88 y.o. male with history of L1 compression fracture, BPH, bladder tumor, COPD, status post TURPT, urocystoscopy, ureteral tumor biopsy, atrial fibrillation, hypertension, GERD, history of diverticulosis with diverticulitis, renal cell carcinoma status post left nephroureterectomy in August 2015, hyperlipidemia, Mobitz type II AV block, status postcardiac pacemaker placement in 2015, who presented to the emergency department for chief concerns of low back pain.   Pursued CT abdomen pelvis given concern for possible lytic lesions of metastatic disease.  Study limited by lack of IV contrast however no metastatic disease noted.  There was L4 compression fracture with 60 to 70% height loss.  Kyphoplasty was performed by IR on 10/24/2021.   He also had AKI  on CKD stage IV with hyperkalemia.  Creatinine has improved to baseline and hyperkalemia has resolved.  He was on amlodipine for hypertension and isosorbide mononitrate prior to admission.  He was found to have orthostatic hypotension while working with PT so amlodipine has been decreased from 5 mg to 2.5 mg daily.  Isosorbide mononitrate has been held.  Orthostatic vital signs can be monitored in the outpatient setting and antihypertensives adjusted accordingly.  He complained of cough.  Chest x-ray obtained on 10/28/2021 did not show any acute cardiopulmonary abnormality.  Oxygen saturation was 89 to 91% on room air.  His condition has improved and he is deemed stable for discharge to SNF today.      ASSESSMENT AND PLAN   Intractable back pain L4 compression fracture S/p kyphoplasty on 10/24/2021.  Analgesics as needed for pain. -Neurosurgery recommends LSO brace when upright. - Poor tolerance of narcotic Start scheduled Tylenol for pain control -Status post kyphoplasty 10/23   Constipation Improved.  He is moving his bowels regularly.  Use laxatives as needed.   AKI on CKD stage IV Patient was prescribed Bactrim prior to cystoscopy.  This has been held.  He is established with Dr. Murlean Iba for outpatient nephrology. Creatinine is stable.   Dysuria Unclear etiology.  Unable to exclude urinary tract infection at this time.  Could be secondary to recent bladder instrumentation Procalcitonin 0.24 Completed 5 days of IV ceftriaxone   Hydrocortisone cream prescribed for itchy lower back on 10/26/2021     Other comorbidities include COPD, chronic diastolic CHF GERD, hypertension, hyperlipidemia         Consultants:  Neurosurgeon, interventional radiologist, nephrologist Procedures performed: Kyphoplasty on 10/24/2021 Disposition: Skilled nursing facility Diet recommendation:  Discharge Diet Orders (From admission, onward)     Start     Ordered   10/28/21 0000  Diet - low  sodium heart healthy        10/28/21 1546           Cardiac diet DISCHARGE MEDICATION: Allergies as of 10/28/2021       Reactions   Penicillins Hives   BLISTERS        Medication List     STOP taking these medications    empagliflozin 10 MG Tabs tablet Commonly known as: Jardiance   Fish Oil 1200 MG Caps   HYDROcodone-acetaminophen 5-325 MG tablet Commonly known as: NORCO/VICODIN   isosorbide mononitrate 30 MG 24 hr tablet Commonly known as: IMDUR   multivitamins ther. w/minerals Tabs tablet   sulfamethoxazole-trimethoprim 800-160 MG tablet Commonly known as: BACTRIM DS       TAKE these medications    acetaminophen 325 MG tablet Commonly known as: TYLENOL Take 650 mg by mouth every 4 (four) hours as needed.   amLODipine 2.5 MG tablet Commonly known as: NORVASC Take 2 tablets (5 mg total) by mouth daily. What changed: medication strength   aspirin 81 MG tablet Take 81 mg by mouth daily.   atorvastatin 40 MG tablet Commonly known as: LIPITOR Take 1 tablet (40 mg total) by mouth daily. Needs appointment for future refills / 2nd attempt   Breztri Aerosphere 160-9-4.8 MCG/ACT Aero Generic drug: Budeson-Glycopyrrol-Formoterol Inhale into the lungs in the morning and at bedtime.   cetirizine 10 MG tablet Commonly known as: ZYRTEC Take 10 mg by mouth as needed.   esomeprazole 20 MG capsule Commonly known as: NEXIUM Take 20 mg by mouth as needed.   guaiFENesin 100 MG/5ML liquid Commonly known as: ROBITUSSIN Take 5 mLs by mouth every 6 (six) hours as needed for up to 4 days for cough or to loosen phlegm.   latanoprost 0.005 % ophthalmic solution Commonly known as: XALATAN Place 1 drop into both eyes at bedtime.   oxybutynin 5 MG tablet Commonly known as: DITROPAN Take 1 tablet (5 mg total) by mouth every 8 (eight) hours as needed for bladder spasms.   ProAir HFA 108 (90 Base) MCG/ACT inhaler Generic drug: albuterol Inhale 1 puff into the  lungs every 4 (four) hours as needed.   senna-docusate 8.6-50 MG tablet Commonly known as: Senokot-S Take 1 tablet by mouth 2 (two) times daily. While taking pain meds to prevent constipation What changed:  when to take this reasons to take this   triamcinolone cream 0.1 % Commonly known as: KENALOG SMARTSIG:sparingly Topical Twice Daily               Discharge Care Instructions  (From admission, onward)           Start     Ordered   10/28/21 0000  Discharge wound care:       Comments: Keep surgical wounds on the back clean and dry   10/28/21 1546            Contact information for after-discharge care     Destination     [facility] .   Service: Skilled Nursing Contact information: [address] [place]                    Discharge Exam: [employer]  10/18/21 1647  Weight: 77.1 kg   GEN: NAD SKIN: Warm and dry EYES: EOMI ENT: MMM CV: RRR PULM: No wheezing or rales heard ABD: soft, ND, NT, +BS CNS: AAO x 3, non focal EXT: No edema or tenderness   Condition at discharge: good  The results of significant diagnostics from this hospitalization (including imaging, microbiology, ancillary and laboratory) are listed below for reference.   Imaging Studies: DG Chest Port 1 View  Result Date: 10/28/2021 CLINICAL DATA:  Cough, productive cough EXAM: PORTABLE CHEST 1 VIEW COMPARISON:  None Available. FINDINGS: LEFT-sided pacer overlies stable cardiac silhouette. There is scarring at the LEFT lung base similar comparison CT. Lungs are hyperinflated. Mild venous congestion no infiltrate pneumothorax. No acute osseous abnormality. IMPRESSION: 1.  No acute cardiopulmonary process. 2. Hyperinflated lungs with central venous congestion. 3. Chronic scarring at the LEFT lung base Electronically Signed   By: Suzy Bouchard M.D.   On: 10/28/2021 14:55   DG Abd 1 View  Result Date:  10/25/2021 CLINICAL DATA:  Constipation EXAM: ABDOMEN - 1 VIEW COMPARISON:  CT abdomen and pelvis dated October 19, 2021 FINDINGS: Nonobstructive bowel-gas pattern. Moderate stool burden. Right ureter stent which unchanged in position when compared with prior CT. Partially visualized lung bases with bibasilar atelectasis. Old left-sided rib fractures. Degenerative changes of the lumbar spine with kyphoplasty changes. No acute osseous abnormality. IMPRESSION: Nonobstructive bowel-gas pattern.  Moderate stool burden. Electronically Signed   By: Yetta Glassman M.D.   On: 10/25/2021 08:36   IR KYPHO LUMBAR INC FX REDUCE BONE BX UNI/BIL CANNULATION INC/IMAGING  Result Date: 10/24/2021 INDICATION: Acute L4 vertebral body compression fracture EXAM: L4 vertebral body augmentation using balloon kyphoplasty COMPARISON:  None Available. MEDICATIONS: Documented in EMR ANESTHESIA/SEDATION: Moderate (conscious) sedation was employed during this procedure. A total of Versed 1 mg and Fentanyl 75 mcg was administered intravenously by the radiology nurse. Total intra-service moderate Sedation Time: 27 minutes. The patient's level of consciousness and vital signs were monitored continuously by radiology nursing throughout the procedure under my direct supervision. FLUOROSCOPY: Radiation Exposure Index (as provided by the fluoroscopic device): 4.5 minutes (69 mGy) COMPLICATIONS: None immediate. PROCEDURE: Informed written consent was obtained from the patient after a thorough discussion of the procedural risks, benefits and alternatives. All questions were addressed. Maximal Sterile Barrier Technique was utilized including caps, mask, sterile gowns, sterile gloves, sterile drape, hand hygiene and skin antiseptic. A timeout was performed prior to the initiation of the procedure. The patient was positioned prone on the exam table. A bipedicular access was planned. Skin entry site(s) were marked overlying the L4 vertebral body  using fluoroscopy. The overlying skin was then prepped and draped in the standard sterile fashion. Local analgesia was obtained with 1% lidocaine. Attention was first turned to the right side. Under fluoroscopic guidance, a 10 gauge introducer needle was advanced towards the lateral margin of the pedicle. Using multiple projections, the introducer needle was advanced towards the posterior margin of the vertebral body via a transpedicular approach. The inner needle was then removed, and the drill was advanced towards the anterior margin of the vertebral body. Attention was then turned to the contralateral side. Under fluoroscopic guidance, a 10 gauge introducer needle was advanced towards the lateral margin of the pedicle. Using multiple projections, the introducer needle was advanced towards the posterior margin of the vertebral body via a transpedicular approach. The inner needle was then removed, and the drill was advanced towards the anterior margin of the vertebral body. The  Kyphon 15 mm inflatable bone tamps were then advanced through the bilateral transpedicular access needles and positioned within the mid vertebral body. Kyphoplasty was then performed, ensuring that the balloon contours stayed within the vertebral body margins. The balloons were then deflated and removed, followed by advancement of the bone filler devices bilaterally and the instillation of acrylic bone cement with excellent filling in the AP and lateral projections. No extravasation was noted in the disk spaces or posteriorly into the spinal canal. No epidural venous contamination was seen. At the end of the procedure, the introducer cannulas and bone filler devices were then removed without difficulty. Clean dressings were placed after hemostasis. The patient tolerated all aspects of the procedure well, and was transferred to recovery in stable condition. IMPRESSION: 1. Successful L4 vertebral body augmentation using balloon kyphoplasty. If  the patient has known osteoporosis, recommend treatment as clinically indicated. If the patient's bone density status is unknown, DEXA scan is recommended. Electronically Signed   By: Albin Felling M.D.   On: 10/24/2021 15:04   CT ABDOMEN PELVIS WO CONTRAST  Addendum Date: 10/19/2021   ADDENDUM REPORT: 10/19/2021 12:23 ADDENDUM: These results were called by telephone at the time of interpretation on 10/19/2021 at 12:23 pm to provider [facility] , who verbally acknowledged these results. Electronically Signed   By: Zetta Bills M.D.   On: 10/19/2021 12:23   Result Date: 10/19/2021 CLINICAL DATA:  Urothelial carcinoma post LEFT nephrectomy. Post recent TURBT. * Tracking Code: BO * EXAM: CT ABDOMEN AND PELVIS WITHOUT CONTRAST TECHNIQUE: Multidetector CT imaging of the abdomen and pelvis was performed following the standard protocol without IV contrast. RADIATION DOSE REDUCTION: This exam was performed according to the departmental dose-optimization program which includes automated exposure control, adjustment of the mA and/or kV according to patient size and/or use of iterative reconstruction technique. COMPARISON:  January 25, 2021. FINDINGS: Lower chest: Trace LEFT and small RIGHT-sided pleural fluid. Basilar scarring in the lingula and at the LEFT lung base. Cardiac pacer leads in the RIGHT heart. Heart is incompletely imaged. No pericardial effusion. No chest wall abnormality. Hepatobiliary: Lobulated area in the medial segment of the LEFT hepatic lobe (image 20/2) slightly more dense than surrounding liver 3.1 x 2.2 cm, unchanged dating back to November of 2012 and as far back as May of 2015, unchanged in size compared to the May of 2015 study but with increased density, shown to represent a cyst at the time of the 2015 evaluation. No pericholecystic stranding. No biliary duct dilation. Pancreas: Pancreas with normal contours, no signs of inflammation or peripancreatic fluid. Spleen: Normal size  and contour, spleen has fall in into the LEFT nephrectomy bed. Adrenals/Urinary Tract: RIGHT adrenal gland is normal. Status post LEFT nephrectomy. LEFT adrenal gland with small LEFT adrenal nodule which is unchanged over a series of prior studies measuring 10 Hounsfield units. Renal cortical scarring on the RIGHT which is moderate. Mildly patulous appearance of the RIGHT ureter without hydronephrosis and with partial reconstitution of the proximal loop of the RIGHT nephroureteral stent and full reconstitution of the distal loop. Proximal loop in anterior interpolar calices and distal loops well reconstituted in the urinary bladder. No signs of perivesical stranding. Small amount of gas in the urinary bladder. Stomach/Bowel: No acute gastrointestinal findings. Herniation of the mid transverse colon into the wide mouth ventral hernia and rectus diastasis in the mid abdomen (image 48/2) 6 cm defect in the abdominal wall. No acute gastric or small bowel process. Vascular/Lymphatic: 3 cm  x 3 cm infrarenal abdominal aortic aneurysm in the setting of moderate to marked calcified aortic atherosclerosis. Vascular structures not well assessed due to lack of intravenous contrast. No adenopathy in the retroperitoneum. No adenopathy in the pelvis. Reproductive: Unremarkable by CT. Other: Mild stranding in the presacral space and along the RIGHT pelvic sidewall and RIGHT retroperitoneum. Mild RIGHT perinephric stranding. No pneumoperitoneum. No ascites. Musculoskeletal: Compression fracture at L4. This is new compared to imaging from January 25, 2021 and shows 60-70% loss of height. Mild retropulsion of posterior cortical elements along the superior endplate. Fracture involves superior endplate of L4. Alignment is unchanged and there are signs of cement augmentation at L2. Segmentation referenced above assumes 5 lumbar type vertebral bodies with L2 as the level previously treated with cement augmentation with transitional  vertebral anatomy at S1. IMPRESSION: 1. Status post LEFT nephrectomy. 2. Post nephroureteral stenting with stranding in the retroperitoneum and pelvis likely related to recent TURBT and ureteral manipulation. 3. Small amount of gas in the urinary bladder related to above procedure. 4. Compression fracture at "L4" is new compared to imaging from January 25, 2021 and shows 60-70% loss of height. Mild retropulsion of posterior cortical elements along the superior endplate. Segmentation assumes S1 is a transitional vertebra as outlined on previous CT of the lumbar spine from January 2023. 5. Trace LEFT and small RIGHT-sided pleural fluid. 6. Herniation of the mid transverse colon into the wide mouth ventral hernia and rectus diastasis in the mid abdomen. 6 cm defect in the abdominal wall. 7. 3 cm x 3 cm infrarenal abdominal aortic aneurysm in the setting of moderate to marked calcified aortic atherosclerosis. Recommend follow-up every 3 years. 8. Stable hepatic lesion in the medial segment of the LEFT hepatic lobe in terms of size dating back to 2015 likely reflecting a hemorrhagic cyst, internal hemorrhage or complexity developing over time. Aortic Atherosclerosis (ICD10-I70.0). These results will be called to the ordering clinician or representative by the Radiologist Assistant, and communication documented in the PACS or [employer]. Electronically Signed: By: Zetta Bills M.D. On: 10/19/2021 11:43   DG Lumbar Spine Complete  Result Date: 10/18/2021 CLINICAL DATA:  Low back pain. History of lumbar fracture with vertebroplasty EXAM: LUMBAR SPINE - COMPLETE 4+ VIEW COMPARISON:  CT lumbar spine 01/18/2021 FINDINGS: Moderate to severe compression fracture of L1 with kyphoplasty cement in good position. Mild to moderate compression fracture of L3 is new since the prior CT and may be acute or subacute. Mild anterolisthesis at L4-5 with disc degeneration and spurring. Right ureteral stent.  No ureteral calculus  identified IMPRESSION: 1. Mild to moderate compression fracture of L3, new since the prior CT and may be acute or subacute. 2. Chronic compression fracture L1 with kyphoplasty cement. Electronically Signed   By: Franchot Gallo M.D.   On: 10/18/2021 17:38   DG Thoracic Spine 2 View  Result Date: 10/18/2021 CLINICAL DATA:  Back pain EXAM: THORACIC SPINE 2 VIEWS COMPARISON:  Chest CT 03/18/2019 FINDINGS: Thoracic alignment within normal limits. Minimal chronic wedging at a proximate T8 level. Remaining vertebra demonstrate normal stature. Degenerative osteophytes. Post augmentation changes at L2. IMPRESSION: Degenerative changes.  No acute osseous abnormality Electronically Signed   By: Donavan Foil M.D.   On: 10/18/2021 17:37   DG OR UROLOGY CYSTO IMAGE ([facility] ONLY)  Result Date: 10/17/2021 There is no interpretation for this exam.  This order is for images obtained during a surgical procedure.  Please See "Surgeries" Tab for more information regarding  the procedure.    Microbiology: Results for orders placed or performed in visit on 10/05/21  Microscopic Examination     Status: None   Collection Time: 10/05/21  2:07 PM   Urine  Result Value Ref Range Status   WBC, UA 0-5 0 - 5 /hpf Final   RBC, Urine 0-2 0 - 2 /hpf Final   Epithelial Cells (non renal) 0-10 0 - 10 /hpf Final   Bacteria, UA None seen None seen/Few Final  CULTURE, URINE COMPREHENSIVE     Status: Abnormal   Collection Time: 10/05/21  3:07 PM   Specimen: Urine   UR  Result Value Ref Range Status   Urine Culture, Comprehensive Final report (A)  Final   Organism ID, Bacteria Proteus mirabilis (A)  Final    Comment: Cefazolin <=4 ug/mL Cefazolin with an MIC <=16 predicts susceptibility to the oral agents cefaclor, cefdinir, cefpodoxime, cefprozil, cefuroxime, cephalexin, and loracarbef when used for therapy of uncomplicated urinary tract infections due to E. coli, Klebsiella pneumoniae, and Proteus mirabilis. 10,000-25,000  colony forming units per mL    ANTIMICROBIAL SUSCEPTIBILITY Comment  Final    Comment:       ** S = Susceptible; I = Intermediate; R = Resistant **                    P = Positive; N = Negative             MICS are expressed in micrograms per mL    Antibiotic                 RSLT#1    RSLT#2    RSLT#3    RSLT#4 Amoxicillin/Clavulanic Acid    S Ampicillin                     S Cefepime                       S Ceftriaxone                    S Cefuroxime                     S Ciprofloxacin                  S Ertapenem                      S Gentamicin                     S Levofloxacin                   S Meropenem                      S Nitrofurantoin                 R Piperacillin/Tazobactam        S Tetracycline                   R Tobramycin                     S Trimethoprim/Sulfa             S     Labs: CBC: Recent Labs  Lab 10/24/21 0545 10/28/21 0626  WBC 3.4* 5.9  NEUTROABS 1.8  --  HGB 9.5* 9.1*  HCT 29.4* 28.6*  MCV 95.1 97.6  PLT 131* 374   Basic Metabolic Panel: Recent Labs  Lab 10/24/21 0545 10/28/21 0626  NA 138 138  K 4.9 5.0  CL 110 107  CO2 23 24  GLUCOSE 111* 113*  BUN 46* 71*  CREATININE 1.65* 1.94*  CALCIUM 9.1 9.1   Liver Function Tests: No results for input(s): "AST", "ALT", "ALKPHOS", "BILITOT", "PROT", "ALBUMIN" in the last 168 hours. CBG: No results for input(s): "GLUCAP" in the last 168 hours.  Discharge time spent: greater than 30 minutes.  Signed: Jennye Boroughs, MD [facility] 10/28/2021

## 2021-10-28 NOTE — Progress Notes (Signed)
EMS transport patient to SNF.

## 2021-10-28 NOTE — Plan of Care (Signed)
  Problem: Education: Goal: Knowledge of General Education information will improve Description: Including pain rating scale, medication(s)/side effects and non-pharmacologic comfort measures Outcome: Progressing   Problem: Health Behavior/Discharge Planning: Goal: Ability to manage health-related needs will improve Outcome: Progressing   Problem: Clinical Measurements: Goal: Ability to maintain clinical measurements within normal limits will improve Outcome: Progressing Goal: Respiratory complications will improve Outcome: Progressing Goal: Cardiovascular complication will be avoided Outcome: Progressing   Problem: Activity: Goal: Risk for activity intolerance will decrease Outcome: Progressing   Problem: Nutrition: Goal: Adequate nutrition will be maintained Outcome: Progressing   Problem: Pain Managment: Goal: General experience of comfort will improve Outcome: Progressing

## 2021-10-28 NOTE — Progress Notes (Signed)
Called report to [facility].

## 2021-10-28 NOTE — Progress Notes (Signed)
Occupational Therapy Treatment Patient Details Name: Jacob [name] MRN: [id number] DOB: 17-Jul-1928 Today's Date: 10/28/2021   History of present illness Pt is a 88 yo male that presented to ED for low back pain. noted for L4 compression fracture. S/p kyphoplasty of L4 10/24/21. Past medical history significant for GERD, diverticulitis, HTN, HDL, renal cancer, and prostate cancer, previous lumbar fractures, COPD, LBBB, pacemaker.   OT comments  Chart reviewed to date, co tx completed with PT on this date. Tx session targeted improving functional mobility tolerance in prep for ADL tasks, task oriented training for ADL tasks. Orthostatics taken, please refer to flow sheet. Pt appears to be symptomatic in standing with reported dizziness. Spo2 >90% on 2L via [place]. Improvements noted in short amb transfer to bsc and bsc, requiring CGA. STS completed with CGA however +2 required as pt requires stabilization of RW. MAX A required for peri care. Frequent vcs required throughout for technique, safety during mobility. Pt is left in chair,a ll needs met. OT will continue to follow acutely.    Recommendations for follow up therapy are one component of a multi-disciplinary discharge planning process, led by the attending physician.  Recommendations may be updated based on patient status, additional functional criteria and insurance authorization.    Follow Up Recommendations  Skilled nursing-short term rehab (<3 hours/day)    Assistance Recommended at Discharge Frequent or constant Supervision/Assistance  Patient can return home with the following  A lot of help with walking and/or transfers;A lot of help with bathing/dressing/bathroom;Assistance with cooking/housework;Help with stairs or ramp for entrance;Assist for transportation   Equipment Recommendations  Other (comment) (per next venue of care)    Recommendations for Other Services      Precautions / Restrictions Precautions Precautions:  Fall Restrictions Weight Bearing Restrictions: No       Mobility Bed Mobility Overal bed mobility: Needs Assistance Bed Mobility: Rolling, Sidelying to Sit Rolling: Mod assist, +2 for physical assistance Sidelying to sit: Mod assist, +2 for physical assistance            Transfers Overall transfer level: Needs assistance   Transfers: Sit to/from Stand Sit to Stand: Min assist, From elevated surface, +2 safety/equipment     Step pivot transfers: Min guard, +2 safety/equipment           Balance Overall balance assessment: Needs assistance Sitting-balance support: Feet supported Sitting balance-Leahy Scale: Fair     Standing balance support: Reliant on assistive device for balance Standing balance-Leahy Scale: Poor                             ADL either performed or assessed with clinical judgement   ADL Overall ADL's : Needs assistance/impaired             Lower Body Bathing: Maximal assistance;Sit to/from stand       Lower Body Dressing: Maximal assistance Lower Body Dressing Details (indicate cue type and reason): socks Toilet Transfer: Min guard;Rolling walker (2 wheels) Toilet Transfer Details (indicate cue type and reason): short shuffled steps, intermittent vcs for techniques Toileting- Clothing Manipulation and Hygiene: Maximal assistance;Sit to/from stand Toileting - Clothing Manipulation Details (indicate cue type and reason): peri care following BM            Extremity/Trunk Assessment              Vision       Perception     Praxis  Cognition Arousal/Alertness: Awake/alert Behavior During Therapy: WFL for tasks assessed/performed Overall Cognitive Status: Within Functional Limits for tasks assessed                                          Exercises      Shoulder Instructions       General Comments please refer to orthostatic BP- pt appears orthostatic in standing, symptomatic;  [place] noted on sacrum and scrotum, NT, RN aware    Pertinent Vitals/ Pain       Pain Assessment Pain Assessment: 0-10 Pain Score: 4  Pain Location: lower back Pain Descriptors / Indicators: Aching, Discomfort Pain Intervention(s): Limited activity within patient's tolerance, Monitored during session, Repositioned  Home Living                                          Prior Functioning/Environment              Frequency  Min 2X/week        Progress Toward Goals  OT Goals(current goals can now be found in the care plan section)  Progress towards OT goals: Progressing toward goals     Plan Discharge plan remains appropriate;Frequency remains appropriate    Co-evaluation    PT/OT/SLP Co-Evaluation/Treatment: Yes Reason for Co-Treatment: For patient/therapist safety;To address functional/ADL transfers PT goals addressed during session: Mobility/safety with mobility;Balance;Proper use of DME OT goals addressed during session: ADL's and self-care      AM-PAC OT "6 Clicks" Daily Activity     Outcome Measure   Help from another person eating meals?: None Help from another person taking care of personal grooming?: A Little Help from another person toileting, which includes using toliet, bedpan, or urinal?: A Lot Help from another person bathing (including washing, rinsing, drying)?: A Lot Help from another person to put on and taking off regular upper body clothing?: A Little Help from another person to put on and taking off regular lower body clothing?: A Lot 6 Click Score: 16    End of Session Equipment Utilized During Treatment: Rolling walker (2 wheels);Oxygen  OT Visit Diagnosis: Unsteadiness on feet (R26.81);Muscle weakness (generalized) (M62.81);Pain   Activity Tolerance Patient tolerated treatment well   Patient Left in chair;with call bell/phone within reach;with nursing/sitter in room;with chair alarm set (NT present)   Nurse  Communication Mobility status        Time: 3151-7616 OT Time Calculation (min): 27 min  Charges: OT General Charges $OT Visit: 1 Visit OT Treatments $Self Care/Home Management : 8-22 mins  Shanon Payor, OTD OTR/L  10/28/21, 1:53 PM

## 2021-10-28 NOTE — TOC Progression Note (Signed)
Transition of Care [facility]) - Progression Note    Patient Details  Name: Jacob [name] MRN: [id number] Date of Birth: 21-May-1928  Transition of Care [facility]) CM/SW Contact  Laurena Slimmer, RN Phone Number: 10/28/2021, 9:21 AM  Clinical Narrative:    FL2 completed Bed search initiated.         Expected Discharge Plan and Services                                                 Social Determinants of Health (SDOH) Interventions    Readmission Risk Interventions     No data to display

## 2021-10-28 NOTE — TOC Progression Note (Addendum)
Transition of Care [facility]) - Progression Note    Patient Details  Name: Jacob [name] MRN: [id number] Date of Birth: 31-Oct-1928  Transition of Care [facility]) CM/SW [place], LCSW Phone Number: 10/28/2021, 10:27 AM  Clinical Narrative:    Spoke with patient. Patient is agreeable to SNF. Patient prefers [facility] in [place], states he has been there before. Sent referral to [place] at [facility] and called Ricky who agreed to review referral and respond in the [place].   10:53- [facility] is able to make a bed offer. Notified MD and asked when patient will be medically ready so that auth can be started when appropriate.   11:32- Per MD, patient is medically ready. CSW updated patient and Ricky at [facility]. Started insurance auth in [place] stated he has to speak to his Environmental health practitioner about an open liability claim from 2012, and will keep CSW updated. [facility] confirmed they can take patient once this is fixed and will keep CSW updated.   1:14- Per Audry Pili at [facility], they have worked out the liability claim issue and can take patient either today if we get auth, or Monday. (Not over weekend)   1:50- Patient has insurance auth, asked MD if patient is medically ready today.   3:00- Per MD, patient may be medically ready today, waiting on CXR. Called Ricky at [facility], left VM asking what the latest patient could come is.        Expected Discharge Plan and Services                                                 Social Determinants of Health (SDOH) Interventions    Readmission Risk Interventions     No data to display

## 2021-10-31 ENCOUNTER — Telehealth: Payer: Self-pay | Admitting: Neurosurgery

## 2021-10-31 NOTE — Telephone Encounter (Signed)
-----   Message from Jacob [name] sent at 10/31/2021 10:09 AM EDT ----- Regarding: [place] fu appt From: Jacob Maw, MD  Sent: 10/20/2021  8:10 AM EDT  To: Jacob [name] Follow up with stacy in 2-3 weeks    Patient was discharged from [facility] on 10/28/2021, I spoke to his son Jacob [name] who stated that patient is currently at [facility] in [place]. He declined follow-up at this time. He will call the office back if patient needs f/u in the future.

## 2021-11-02 ENCOUNTER — Ambulatory Visit: Payer: Medicare Other | Admitting: Urology

## 2021-11-21 ENCOUNTER — Ambulatory Visit: Payer: Medicare Other | Admitting: Dermatology

## 2021-12-05 ENCOUNTER — Other Ambulatory Visit: Payer: Self-pay | Admitting: Internal Medicine

## 2021-12-07 ENCOUNTER — Ambulatory Visit (INDEPENDENT_AMBULATORY_CARE_PROVIDER_SITE_OTHER): Payer: Medicare Other

## 2021-12-07 DIAGNOSIS — I442 Atrioventricular block, complete: Secondary | ICD-10-CM | POA: Diagnosis not present

## 2021-12-11 LAB — CUP PACEART REMOTE DEVICE CHECK
Battery Impedance: 1546 Ohm
Battery Remaining Longevity: 41 mo
Battery Voltage: 2.75 V
Brady Statistic AP VP Percent: 60 %
Brady Statistic AP VS Percent: 0 %
Brady Statistic AS VP Percent: 39 %
Brady Statistic AS VS Percent: 1 %
Date Time Interrogation Session: 20231209103511
Implantable Lead Connection Status: 753985
Implantable Lead Connection Status: 753985
Implantable Lead Implant Date: 20150903
Implantable Lead Implant Date: 20150903
Implantable Lead Location: 753859
Implantable Lead Location: 753860
Implantable Lead Model: 5076
Implantable Lead Model: 5076
Implantable Pulse Generator Implant Date: 20150903
Lead Channel Impedance Value: 440 Ohm
Lead Channel Impedance Value: 539 Ohm
Lead Channel Pacing Threshold Amplitude: 0.625 V
Lead Channel Pacing Threshold Amplitude: 0.75 V
Lead Channel Pacing Threshold Pulse Width: 0.4 ms
Lead Channel Pacing Threshold Pulse Width: 0.4 ms
Lead Channel Setting Pacing Amplitude: 2 V
Lead Channel Setting Pacing Amplitude: 2.5 V
Lead Channel Setting Pacing Pulse Width: 0.4 ms
Lead Channel Setting Sensing Sensitivity: 2.8 mV
Zone Setting Status: 755011
Zone Setting Status: 755011

## 2021-12-15 ENCOUNTER — Ambulatory Visit (INDEPENDENT_AMBULATORY_CARE_PROVIDER_SITE_OTHER): Payer: Medicare Other | Admitting: Urology

## 2021-12-15 ENCOUNTER — Telehealth: Payer: Self-pay

## 2021-12-15 VITALS — BP 108/62 | HR 80 | Ht 69.0 in | Wt 175.0 lb

## 2021-12-15 DIAGNOSIS — C679 Malignant neoplasm of bladder, unspecified: Secondary | ICD-10-CM

## 2021-12-15 DIAGNOSIS — N472 Paraphimosis: Secondary | ICD-10-CM | POA: Diagnosis not present

## 2021-12-15 DIAGNOSIS — D494 Neoplasm of unspecified behavior of bladder: Secondary | ICD-10-CM

## 2021-12-15 DIAGNOSIS — C661 Malignant neoplasm of right ureter: Secondary | ICD-10-CM | POA: Diagnosis not present

## 2021-12-15 MED ORDER — NYSTATIN-TRIAMCINOLONE 100000-0.1 UNIT/GM-% EX OINT: 1.0000 | TOPICAL_OINTMENT | Freq: Two times a day (BID) | CUTANEOUS | 0 refills | Status: DC

## 2021-12-15 NOTE — Telephone Encounter (Signed)
I spoke with [name] and son while in office. We have discussed possible surgery dates and Monday January 8th, 2024 was agreed upon by all parties. Patient given information about surgery date, what to expect pre-operatively and post operatively.  We discussed that a Pre-Admission Testing office will be calling to set up the pre-op visit that will take place prior to surgery, and that these appointments are typically done over the phone with a Pre-Admissions RN.  Informed patient that our office will communicate any additional care to be provided after surgery. Patients questions or concerns were discussed during our call. Advised to call our office should there be any additional information, questions or concerns that arise. Patient verbalized understanding.

## 2021-12-15 NOTE — Progress Notes (Signed)
I, DeAsia L Maxie,acting as a scribe for [name], MD.,have documented all relevant documentation on the behalf of [name], MD,as directed by  [name], MD while in the presence of [name], MD.   12/15/21 10:55 AM   Jacob [name] 07/05/1928 [id number]  Referring provider: [name], [place],   [address]  Chief Complaint  Patient presents with   Prostate Cancer    Follow up    HPI: 88 year-old male who has a personal history of large volume left upper tract TCC status post left nephroureterectomy in 2015 who returns today to discussed further treatment options.  He was mostly recently found to have multifocal bladder recurrence. He had an abnormal right retrograde with a filing defect in the right distal ureter approximately 1.5 cm. He had a partially obstructing ureteral tumor which was ablated and fulgurated and stent was placed. Surgical pathology was consistent with high grade non-invasive tumor both in the ureter and the bladder.   He was admitted the following day found to have a compression fracture, L4 compression felt to be spontaneous and benign.   CT non-contrast on 10/19/21 showed no obvious of metastatic disease.   He was in rehab for 5 weeks which he noticed his foreskin was completely covering the head of the penis. He tried retracting the foreskin which he noticed some stinging and bleeding. He is uncircumcised. He is currently on home health care.   PMH: Past Medical History:  Diagnosis Date   Anemia    Arthritis of both hands    BPH (benign prostatic hypertrophy)    COPD (chronic obstructive pulmonary disease) (HCC)    Coronary artery disease    a.) LHC 11/18/2004: 40% pLAD, 50% mLAD, 80% D1, 50% mLCx, 100% mRCA - refer to [facility] for PCI; b.) LHC/PCI 11/21/2004: 100% mRCA (3.5 x 23 mm Cypher DES), 95% dRCA (2.5 x 28 mm Cypher); b.) 05/03/12 Cardiac CT: patent RCA stents, nonobs dzs;  c.) 01/2013 Ex MV: nl EF,  no ischemia.   Diastolic dysfunction    a.) TTE 08/29/2013: EF 60-65%, mild LVH, mild BAE, mild MR, mod TR, G1DD; b.) TTE 07/01/2020: EF 50-55%, LAEm triv MR, mild-mod Ao sclerosis with no stenosis, G2DD   GERD (gastroesophageal reflux disease)    Glaucoma of both eyes    H/O hiatal hernia    History of atrial fibrillation 2006   History of diverticulitis    History of kidney stones    History of renal cell carcinoma    a.) s/p LEFT nephroureterectomy   Hyperlipidemia    Hypertension    LBBB (left bundle branch block)    Mobitz type 2 second degree AV block    a.) s/p PPM placement 08/29/2013   Pneumonia    Presence of permanent cardiac pacemaker    Recurrent bladder transitional cell carcinoma ([place])    a.) s/p TURBT and intravesical chemotherapy   Renal insufficiency    a. Creat rose to 2.34 post-op L nephrectomy.   S/P placement of cardiac pacemaker 08/29/2013   a.) Medtroinc device placed for symptomatic bradycardia and mobitz 2 second degree heart block   Symptomatic bradycardia    a.) s/p PPM placement 08/29/2013    Surgical History: Past Surgical History:  Procedure Laterality Date   APPENDECTOMY  02/03/1988   BLADDER INSTILLATION N/A 10/17/2021   Procedure: BLADDER INSTILLATION OF GEMCITABINE;  Surgeon: [name], MD;  Location: [facility];  Service: Urology;  Laterality: N/A;  CATARACT EXTRACTION W/ INTRAOCULAR LENS  IMPLANT, BILATERAL  01/02/2009   COLONOSCOPY     CORONARY ANGIOPLASTY WITH STENT PLACEMENT  11/21/2004   Procedure: CORONARY ANGIOPLASTY WITH STENT PLACEMENT (mRCA and dRCA); Location: [facility]; Surgeon: Maryjean Morn, MD   CYSTOSCOPY W/ RETROGRADES Right 01/13/2014   Procedure: CYSTOSCOPY WITH RETROGRADE PYELOGRAM;  Surgeon: [name], MD;  Location: [facility];  Service: Urology;  Laterality: Right;   CYSTOSCOPY W/ URETERAL STENT PLACEMENT Left 08/22/2013   Procedure: CYSTOSCOPY WITH RETROGRADE PYELOGRAM/URETERAL STENT PLACEMENT;   Surgeon: [name], MD;  Location: [facility];  Service: Urology;  Laterality: Left;   CYSTOSCOPY W/ URETERAL STENT PLACEMENT Right 10/17/2021   Procedure: CYSTOSCOPY WITH RETROGRADE PYELOGRAM/URETERAL STENT PLACEMENT;  Surgeon: [name], MD;  Location: [facility];  Service: Urology;  Laterality: Right;   CYSTOSCOPY WITH BIOPSY  04/13/2011   Procedure: CYSTOSCOPY WITH BIOPSY;  Surgeon: [name], MD;  Location: [facility];  Service: Urology;  Laterality: N/A;  Cold Cup Biopsy   CYSTOSCOPY WITH RETROGRADE PYELOGRAM, URETEROSCOPY AND STENT PLACEMENT Left 06/19/2013   Procedure: CYSTOSCOPY WITH LEFT RETROGRADE PYELOGRAM, LEFT URETEROSCOPY, ureteral balloon dilatation, BIOPSY LEFT KIDNEY;  Surgeon: [name], MD;  Location: [facility];  Service: Urology;  Laterality: Left;   INGUINAL HERNIA REPAIR Left 08/22/2013   Procedure: LAPAROSCOPIC INGUINAL HERNIA;  Surgeon: [name], MD;  Location: [facility];  Service: Urology;  Laterality: Left;   IR KYPHO LUMBAR INC FX REDUCE BONE BX UNI/BIL CANNULATION INC/IMAGING  01/31/2021   IR KYPHO LUMBAR INC FX REDUCE BONE BX UNI/BIL CANNULATION INC/IMAGING  10/24/2021   LEFT HEART CATH AND CORONARY ANGIOGRAPHY Left 11/18/2004   Procedure: LEFT HEART CATH AND CORONARY ANGIOGRAPHY; Location: [facility]; Surgeon: Neoma Laming, MD   PERMANENT PACEMAKER INSERTION N/A 08/29/2013   Procedure: PERMANENT PACEMAKER INSERTION;  Surgeon: [name], MD;  Location: [facility] CATH [facility];  Service: Cardiovascular;  Laterality: N/A;  Medtronic dual-chamber   ROBOT ASSITED LAPAROSCOPIC NEPHROURETERECTOMY Left 08/22/2013   Procedure: ROBOT ASSITED LAPAROSCOPIC NEPHROURETERECTOMY;  Surgeon: [name], MD;  Location: [facility];  Service: Urology;  Laterality: Left;   TRANSURETHRAL RESECTION OF BLADDER TUMOR  12/22/2010   TRANSURETHRAL RESECTION OF BLADDER TUMOR  08/04/2011   Procedure: TRANSURETHRAL RESECTION OF BLADDER TUMOR (TURBT);  Surgeon: [name], MD;  Location: [facility];  Service: Urology;  Laterality: N/A;   TRANSURETHRAL RESECTION OF BLADDER TUMOR N/A 01/13/2014   Procedure: TRANSURETHRAL RESECTION OF BLADDER TUMOR (TURBT);  Surgeon: [name], MD;  Location: [facility];  Service: Urology;  Laterality: N/A;   TRANSURETHRAL RESECTION OF BLADDER TUMOR  10/17/2021   Procedure: TRANSURETHRAL RESECTION OF BLADDER TUMOR (TURBT);  Surgeon: [name], MD;  Location: [facility];  Service: Urology;;   URETERAL BIOPSY  10/17/2021   Procedure: URETERAL BIOPSY;  Surgeon: [name], MD;  Location: [facility];  Service: Urology;;   URETEROSCOPY  10/17/2021   Procedure: URETEROSCOPY;  Surgeon: [name], MD;  Location: [facility];  Service: Urology;;    Home Medications:  Allergies as of 12/15/2021       Reactions   Penicillins Hives   BLISTERS        Medication List        Accurate as of December 15, 2021 10:55 AM. If you have any questions, ask your nurse or doctor.          STOP taking these medications    amLODipine 2.5 MG tablet Commonly known as:  NORVASC Stopped by: [name], MD   oxybutynin 5 MG tablet Commonly known as: DITROPAN Stopped by: [name], MD       TAKE these medications    acetaminophen 325 MG tablet Commonly known as: TYLENOL Take 650 mg by mouth every 4 (four) hours as needed.   aspirin 81 MG tablet Take 81 mg by mouth daily.   atorvastatin 40 MG tablet Commonly known as: LIPITOR TAKE 1 TABLET BY MOUTH ONCE DAILY. APPOINTMENT NEEDED FOR FURTHER REFILLS.   Breztri Aerosphere 160-9-4.8 MCG/ACT Aero Generic drug: Budeson-Glycopyrrol-Formoterol Inhale into the lungs in the morning and at bedtime.   cetirizine 10 MG tablet Commonly known as: ZYRTEC Take 10 mg by mouth as needed.   esomeprazole 20 MG capsule Commonly known as: NEXIUM Take 20 mg by mouth as needed.   latanoprost 0.005 % ophthalmic  solution Commonly known as: XALATAN Place 1 drop into both eyes at bedtime.   nystatin-triamcinolone ointment Commonly known as: MYCOLOG Apply 1 Application topically 2 (two) times daily. Started by: [name], MD   ProAir HFA [phone] Base) MCG/ACT inhaler Generic drug: albuterol Inhale 1 puff into the lungs every 4 (four) hours as needed.   senna-docusate 8.6-50 MG tablet Commonly known as: Senokot-S Take 1 tablet by mouth 2 (two) times daily. While taking pain meds to prevent constipation What changed:  when to take this reasons to take this   triamcinolone cream 0.1 % Commonly known as: KENALOG SMARTSIG:sparingly Topical Twice Daily        Allergies:  Allergies  Allergen Reactions   Penicillins Hives    BLISTERS    Family History: Family History  Problem Relation Age of Onset   Heart attack Mother    Heart attack Father    Prostate cancer Neg Hx    Bladder Cancer Neg Hx    Kidney cancer Neg Hx     Social History:  reports that he quit smoking about 21 years ago. His smoking use included cigarettes. He has a 50.00 pack-year smoking history. He has never used smokeless tobacco. He reports that he does not drink alcohol and does not use drugs.   Physical Exam: BP 108/62   Pulse 80   Ht 5\' 9"  (1.753 m)   Wt 175 lb (79.4 kg)   BMI 25.84 kg/m   Constitutional:  Alert and oriented, No acute distress.  Accompanied today by son. GU: Phimosis appreciated with mild inflammation. HEENT: [place] AT, moist mucus membranes.  Trachea midline, no masses. Neurologic: Grossly intact, no focal deficits, moving all 4 extremities. Psychiatric: Normal mood and affect.  Laboratory Data: Lab Results  Component Value Date   WBC 5.9 10/28/2021   HGB 9.1 (L) 10/28/2021   HCT 28.6 (L) 10/28/2021   MCV 97.6 10/28/2021   PLT 206 10/28/2021    Lab Results  Component Value Date   CREATININE 1.94 (H) 10/28/2021    Pertinent Imaging: EXAM: CT ABDOMEN AND PELVIS WITHOUT  CONTRAST   TECHNIQUE: Multidetector CT imaging of the abdomen and pelvis was performed following the standard protocol without IV contrast.   RADIATION DOSE REDUCTION: This exam was performed according to the departmental dose-optimization program which includes automated exposure control, adjustment of the mA and/or kV according to patient size and/or use of iterative reconstruction technique.   COMPARISON:  January 25, 2021.   FINDINGS: Lower chest: Trace LEFT and small RIGHT-sided pleural fluid. Basilar scarring in the lingula and at the LEFT lung base. Cardiac pacer leads in the  RIGHT heart. Heart is incompletely imaged. No pericardial effusion. No chest wall abnormality.   Hepatobiliary: Lobulated area in the medial segment of the LEFT hepatic lobe (image 20/2) slightly more dense than surrounding liver 3.1 x 2.2 cm, unchanged dating back to November of 2012 and as far back as May of 2015, unchanged in size compared to the May of 2015 study but with increased density, shown to represent a cyst at the time of the 2015 evaluation. No pericholecystic stranding. No biliary duct dilation.   Pancreas: Pancreas with normal contours, no signs of inflammation or peripancreatic fluid.   Spleen: Normal size and contour, spleen has fall in into the LEFT nephrectomy bed.   Adrenals/Urinary Tract: RIGHT adrenal gland is normal. Status post LEFT nephrectomy. LEFT adrenal gland with small LEFT adrenal nodule which is unchanged over a series of prior studies measuring 10 Hounsfield units. Renal cortical scarring on the RIGHT which is moderate. Mildly patulous appearance of the RIGHT ureter without hydronephrosis and with partial reconstitution of the proximal loop of the RIGHT nephroureteral stent and full reconstitution of the distal loop. Proximal loop in anterior interpolar calices and distal loops well reconstituted in the urinary bladder. No signs of perivesical stranding. Small  amount of gas in the urinary bladder.   Stomach/Bowel: No acute gastrointestinal findings. Herniation of the mid transverse colon into the wide mouth ventral hernia and rectus diastasis in the mid abdomen (image 48/2) 6 cm defect in the abdominal wall. No acute gastric or small bowel process.   Vascular/Lymphatic: 3 cm x 3 cm infrarenal abdominal aortic aneurysm in the setting of moderate to marked calcified aortic atherosclerosis. Vascular structures not well assessed due to lack of intravenous contrast. No adenopathy in the retroperitoneum. No adenopathy in the pelvis.   Reproductive: Unremarkable by CT.   Other: Mild stranding in the presacral space and along the RIGHT pelvic sidewall and RIGHT retroperitoneum. Mild RIGHT perinephric stranding. No pneumoperitoneum. No ascites.   Musculoskeletal: Compression fracture at L4. This is new compared to imaging from January 25, 2021 and shows 60-70% loss of height. Mild retropulsion of posterior cortical elements along the superior endplate. Fracture involves superior endplate of L4. Alignment is unchanged and there are signs of cement augmentation at L2. Segmentation referenced above assumes 5 lumbar type vertebral bodies with L2 as the level previously treated with cement augmentation with transitional vertebral anatomy at S1.   IMPRESSION: 1. Status post LEFT nephrectomy. 2. Post nephroureteral stenting with stranding in the retroperitoneum and pelvis likely related to recent TURBT and ureteral manipulation. 3. Small amount of gas in the urinary bladder related to above procedure. 4. Compression fracture at "L4" is new compared to imaging from January 25, 2021 and shows 60-70% loss of height. Mild retropulsion of posterior cortical elements along the superior endplate. Segmentation assumes S1 is a transitional vertebra as outlined on previous CT of the lumbar spine from January 2023. 5. Trace LEFT and small RIGHT-sided pleural  fluid. 6. Herniation of the mid transverse colon into the wide mouth ventral hernia and rectus diastasis in the mid abdomen. 6 cm defect in the abdominal wall. 7. 3 cm x 3 cm infrarenal abdominal aortic aneurysm in the setting of moderate to marked calcified aortic atherosclerosis. Recommend follow-up every 3 years. 8. Stable hepatic lesion in the medial segment of the LEFT hepatic lobe in terms of size dating back to 2015 likely reflecting a hemorrhagic cyst, internal hemorrhage or complexity developing over time.   Aortic Atherosclerosis (ICD10-I70.0).  These results will be called to the ordering clinician or representative by the Radiologist Assistant, and communication documented in the PACS or [employer].   Electronically Signed: By: Zetta Bills M.D. On: 10/19/2021 11:43  Personally reviewed this study, agree with radiologic interpretation   Assessment & Plan:    Recurrent bladder cancer/right ureteral cancer - High non-invasive. Complicated by age, comorbidity and solitary kidney.  - I recommend return to the operating room for second look cystoscopy and ureteroscopy with possible intervention including further tumor ablation and stent replacement along intravascular gemcitabine. Risks and benefits discussed, bleeding, infection, stent pain.  -Following this, recommend another induction course of BCG with the stent in place given his multifocal complicated recurrence. -Information/ literature about BCG given today -Pending urinalysis / UCx preop  2. Phimosis  - Prescribed Nystatin ointment, apply BID     [facility] [address] [place], [place] [address] [phone]  I have reviewed the above documentation for accuracy and completeness, and I agree with the above.   [name], MD  I spent 50 total minutes on the day of the encounter including pre-visit review of the medical record, face-to-face time with  the patient, and post visit ordering of labs/imaging/tests.

## 2021-12-15 NOTE — Patient Instructions (Signed)
Bacillus Calmette-Guerin Live, BCG intravesical solution What is this medication? BACILLUS CALMETTE-GUERIN LIVE, BCG (ba SIL us  KAL met  gay RAYN) is a bacteria solution. This medicine stimulates the immune system to ward off cancer cells. It is used to treat bladder cancer. This medicine may be used for other purposes; ask your health care provider or pharmacist if you have questions. This medicine may be used for other purposes; ask your health care provider or pharmacist if you have questions. COMMON BRAND NAME(S): Theracys, TICE BCG What should I tell my care team before I take this medication? They need to know if you have any of these conditions: aneurysm blood in the urine bladder biopsy within 2 weeks fever or infection immune system problems leukemia lymphoma myasthenia gravis need organ transplant prosthetic device like arterial graft, artificial joint, prosthetic heart valve recent or ongoing radiation therapy tuberculosis an unusual or allergic reaction to Bacillus Calmette-Guerin Live, BCG, latex, other medicines, foods, dyes, or preservatives pregnant or trying to get pregnant breast-feeding How should I use this medication? This drug is given as a catheter infusion into the bladder. It is administered in a hospital or clinic by a specially trained health care provider. You will be given directions to follow before the treatment. Follow your health care provider's directions carefully. This medicine contains live bacteria. It is very important to follow these directions closely after treatment to prevent others from coming in contact with your urine. Your health care provider may give you additional directions to follow. Try to hold this medicine in your bladder for 1 to 2 hours. Follow these directions the first time you go to the bathroom and for 6 hours after the first void. Wash your hands before using the restroom. After voiding, wash your hands and genital area. Use a  toilet and sit when going to the bathroom. This helps to prevent the urine from splashing. Do not use public toilets or void outside. After each void, add 2 cups of undiluted bleach to the toilet. Close the lid. Wait 15 to 20 minutes and then flush the toilet. After the first void, drink more fluids to help dilute your urine. If you have urinary incontinence, wash the clothes you were wearing in the washer immediately. Do not wash other clothes at the same time. If you are wearing an incontinence pad, pour bleach on the pad and allow it to soak into the pad before throwing it away. Put the pad in a plastic bag and put it in the trash. Talk to your pediatrician regarding the use of this medicine in children. Special care may be needed. Overdosage: If you think you have taken too much of this medicine contact a poison control center or emergency room at once. NOTE: This medicine is only for you. Do not share this medicine with others. Overdosage: If you think you have taken too much of this medicine contact a poison control center or emergency room at once. NOTE: This medicine is only for you. Do not share this medicine with others. What if I miss a dose? It is important not to miss your dose. Call your doctor or health care professional if you are unable to keep an appointment. What may interact with this medication? antibiotics medicines to suppress your immune system like chemotherapy agents or corticosteroids medicine to treat tuberculosis This list may not describe all possible interactions. Give your health care provider a list of all the medicines, herbs, non-prescription drugs, or dietary supplements you use.   Also tell them if you smoke, drink alcohol, or use illegal drugs. Some items may interact with your medicine. This list may not describe all possible interactions. Give your health care provider a list of all the medicines, herbs, non-prescription drugs, or dietary supplements you use.  Also tell them if you smoke, drink alcohol, or use illegal drugs. Some items may interact with your medicine. What should I watch for while using this medication? Visit your health care provider for checks on your progress. This medicine may make you feel generally unwell. Contact your health care provider if your symptoms last more than 2 days or if they get worse. Call your health care provider right away if you have a severe or unusual symptom. Infection can be spread to others through contact with this medicine. To prevent the spread of infection, follow your health care provider's directions carefully after treatment. Do not become pregnant while taking this medicine. There is a potential for serious side effects to an unborn child. Talk to your health care provider for more information. Do not breast-feed an infant while taking this medicine. If you have sex while on this medicine, use a condom. Ask your health care provider how long you should use a condom. What side effects may I notice from receiving this medication? Side effects that you should report to your doctor or health care professional as soon as possible: allergic reactions like skin rash, itching or hives, swelling of the face, lips, or tongue signs of infection - fever or chills, cough, sore throat, pain or difficulty passing urine signs of decreased red blood cells - unusually weak or tired, fainting spells, lightheadedness blood in urine breathing problems cough eye pain, redness flu-like symptoms joint pain bladder-area pain for more than 2 days after treatment trouble passing urine or change in the amount of urine vomiting yellowing of the eyes or skin Side effects that usually do not require medical attention (report to your doctor or health care professional if they continue or are bothersome): bladder spasm burning when passing urine within 2 days of treatment feel need to pass urine often or wake up at night to  pass urine loss of appetite This list may not describe all possible side effects. Call your doctor for medical advice about side effects. You may report side effects to FDA at 1-800-FDA-1088. This list may not describe all possible side effects. Call your doctor for medical advice about side effects. You may report side effects to FDA at 1-800-FDA-1088. Where should I keep my medication? This drug is given in a hospital or clinic and will not be stored at home. NOTE: This sheet is a summary. It may not cover all possible information. If you have questions about this medicine, talk to your doctor, pharmacist, or health care provider.  2023 Elsevier/Gold Standard (2018-02-22 00:00:00)

## 2021-12-15 NOTE — H&P (View-Only) (Signed)
I, Jacob [name],acting as a scribe for Jacob Espy, MD.,have documented all relevant documentation on the behalf of Jacob Espy, MD,as directed by  Jacob Espy, MD while in the presence of Jacob Espy, MD.   12/15/21 10:55 AM   Jacob [name] [birth year] [id number]  Referring provider: [name], [place],  [place] [address]  Chief Complaint  Patient presents with   Prostate Cancer    Follow up    HPI: 88 year-old male who has a personal history of large volume left upper tract TCC status post left nephroureterectomy in 2015 who returns today to discussed further treatment options.  He was mostly recently found to have multifocal bladder recurrence. He had an abnormal right retrograde with a filing defect in the right distal ureter approximately 1.5 cm. He had a partially obstructing ureteral tumor which was ablated and fulgurated and stent was placed. Surgical pathology was consistent with high grade non-invasive tumor both in the ureter and the bladder.   He was admitted the following day found to have a compression fracture, L4 compression felt to be spontaneous and benign.   CT non-contrast on 10/19/21 showed no obvious of metastatic disease.   He was in rehab for 5 weeks which he noticed his foreskin was completely covering the head of the penis. He tried retracting the foreskin which he noticed some stinging and bleeding. He is uncircumcised. He is currently on home health care.   PMH: Past Medical History:  Diagnosis Date   Anemia    Arthritis of both hands    BPH (benign prostatic hypertrophy)    COPD (chronic obstructive pulmonary disease) (HCC)    Coronary artery disease    a.) LHC 11/18/2004: 40% pLAD, 50% mLAD, 80% D1, 50% mLCx, 100% mRCA - refer to [facility] for PCI; b.) LHC/PCI 11/21/2004: 100% mRCA (3.5 x 23 mm Cypher DES), 95% dRCA (2.5 x 28 mm Cypher); b.) 05/03/12 Cardiac CT: patent RCA stents, nonobs dzs;  c.) 01/2013 Ex MV: nl EF,  no ischemia.   Diastolic dysfunction    a.) TTE 08/29/2013: EF 60-65%, mild LVH, mild BAE, mild MR, mod TR, G1DD; b.) TTE 07/01/2020: EF 50-55%, LAEm triv MR, mild-mod Ao sclerosis with no stenosis, G2DD   GERD (gastroesophageal reflux disease)    Glaucoma of both eyes    H/O hiatal hernia    History of atrial fibrillation 2006   History of diverticulitis    History of kidney stones    History of renal cell carcinoma    a.) s/p LEFT nephroureterectomy   Hyperlipidemia    Hypertension    LBBB (left bundle branch block)    Mobitz type 2 second degree AV block    a.) s/p PPM placement 08/29/2013   Pneumonia    Presence of permanent cardiac pacemaker    Recurrent bladder transitional cell carcinoma ([place])    a.) s/p TURBT and intravesical chemotherapy   Renal insufficiency    a. Creat rose to 2.34 post-op L nephrectomy.   S/P placement of cardiac pacemaker 08/29/2013   a.) Medtroinc device placed for symptomatic bradycardia and mobitz 2 second degree heart block   Symptomatic bradycardia    a.) s/p PPM placement 08/29/2013    Surgical History: Past Surgical History:  Procedure Laterality Date   APPENDECTOMY  02/03/1988   BLADDER INSTILLATION N/A 10/17/2021   Procedure: BLADDER INSTILLATION OF GEMCITABINE;  Surgeon: Jacob Espy, MD;  Location: [facility];  Service: Urology;  Laterality: N/A;  CATARACT EXTRACTION W/ INTRAOCULAR LENS  IMPLANT, BILATERAL  01/02/2009   COLONOSCOPY     CORONARY ANGIOPLASTY WITH STENT PLACEMENT  11/21/2004   Procedure: CORONARY ANGIOPLASTY WITH STENT PLACEMENT (mRCA and dRCA); Location: [facility]; Surgeon: Maryjean Morn, MD   CYSTOSCOPY W/ RETROGRADES Right 01/13/2014   Procedure: CYSTOSCOPY WITH RETROGRADE PYELOGRAM;  Surgeon: [name], MD;  Location: [facility];  Service: Urology;  Laterality: Right;   CYSTOSCOPY W/ URETERAL STENT PLACEMENT Left 08/22/2013   Procedure: CYSTOSCOPY WITH RETROGRADE PYELOGRAM/URETERAL STENT PLACEMENT;   Surgeon: [name], MD;  Location: [facility];  Service: Urology;  Laterality: Left;   CYSTOSCOPY W/ URETERAL STENT PLACEMENT Right 10/17/2021   Procedure: CYSTOSCOPY WITH RETROGRADE PYELOGRAM/URETERAL STENT PLACEMENT;  Surgeon: Jacob Espy, MD;  Location: [facility];  Service: Urology;  Laterality: Right;   CYSTOSCOPY WITH BIOPSY  04/13/2011   Procedure: CYSTOSCOPY WITH BIOPSY;  Surgeon: [name], MD;  Location: [facility];  Service: Urology;  Laterality: N/A;  Cold Cup Biopsy   CYSTOSCOPY WITH RETROGRADE PYELOGRAM, URETEROSCOPY AND STENT PLACEMENT Left 06/19/2013   Procedure: CYSTOSCOPY WITH LEFT RETROGRADE PYELOGRAM, LEFT URETEROSCOPY, ureteral balloon dilatation, BIOPSY LEFT KIDNEY;  Surgeon: [name], MD;  Location[facility];  Service: Urology;  Laterality: Left;   INGUINAL HERNIA REPAIR Left 08/22/2013   Procedure: LAPAROSCOPIC INGUINAL HERNIA;  Surgeon: [name], MD;  Location: [facility];  Service: Urology;  Laterality: Left;   IR KYPHO LUMBAR INC FX REDUCE BONE BX UNI/BIL CANNULATION INC/IMAGING  01/31/2021   IR KYPHO LUMBAR INC FX REDUCE BONE BX UNI/BIL CANNULATION INC/IMAGING  10/24/2021   LEFT HEART CATH AND CORONARY ANGIOGRAPHY Left 11/18/2004   Procedure: LEFT HEART CATH AND CORONARY ANGIOGRAPHY; Location: [facility]; Surgeon: Neoma Laming, MD   PERMANENT PACEMAKER INSERTION N/A 08/29/2013   Procedure: PERMANENT PACEMAKER INSERTION;  Surgeon: [name], MD;  Location: [facility];  Service: Cardiovascular;  Laterality: N/A;  Medtronic dual-chamber   ROBOT ASSITED LAPAROSCOPIC NEPHROURETERECTOMY Left 08/22/2013   Procedure: ROBOT ASSITED LAPAROSCOPIC NEPHROURETERECTOMY;  Surgeon: [name], MD;  Location: [facility];  Service: Urology;  Laterality: Left;   TRANSURETHRAL RESECTION OF BLADDER TUMOR  12/22/2010   TRANSURETHRAL RESECTION OF BLADDER TUMOR  08/04/2011   Procedure: TRANSURETHRAL RESECTION OF BLADDER TUMOR (TURBT);  Surgeon: [name], MD;  Location: [facility];  Service: Urology;  Laterality: N/A;   TRANSURETHRAL RESECTION OF BLADDER TUMOR N/A 01/13/2014   Procedure: TRANSURETHRAL RESECTION OF BLADDER TUMOR (TURBT);  Surgeon: [name], MD;  Location: [facility];  Service: Urology;  Laterality: N/A;   TRANSURETHRAL RESECTION OF BLADDER TUMOR  10/17/2021   Procedure: TRANSURETHRAL RESECTION OF BLADDER TUMOR (TURBT);  Surgeon: Jacob Espy, MD;  Location: [facility];  Service: Urology;;   URETERAL BIOPSY  10/17/2021   Procedure: URETERAL BIOPSY;  Surgeon: Jacob Espy, MD;  Location: [facility];  Service: Urology;;   URETEROSCOPY  10/17/2021   Procedure: URETEROSCOPY;  Surgeon: Jacob Espy, MD;  Location: [facility];  Service: Urology;;    Home Medications:  Allergies as of 12/15/2021       Reactions   Penicillins Hives   BLISTERS        Medication List        Accurate as of December 15, 2021 10:55 AM. If you have any questions, ask your nurse or doctor.          STOP taking these medications    amLODipine 2.5 MG tablet Commonly known as:  NORVASC Stopped by: Jacob Espy, MD   oxybutynin 5 MG tablet Commonly known as: DITROPAN Stopped by: Jacob Espy, MD       TAKE these medications    acetaminophen 325 MG tablet Commonly known as: TYLENOL Take 650 mg by mouth every 4 (four) hours as needed.   aspirin 81 MG tablet Take 81 mg by mouth daily.   atorvastatin 40 MG tablet Commonly known as: LIPITOR TAKE 1 TABLET BY MOUTH ONCE DAILY. APPOINTMENT NEEDED FOR FURTHER REFILLS.   Breztri Aerosphere 160-9-4.8 MCG/ACT Aero Generic drug: Budeson-Glycopyrrol-Formoterol Inhale into the lungs in the morning and at bedtime.   cetirizine 10 MG tablet Commonly known as: ZYRTEC Take 10 mg by mouth as needed.   esomeprazole 20 MG capsule Commonly known as: NEXIUM Take 20 mg by mouth as needed.   latanoprost 0.005 % ophthalmic  solution Commonly known as: XALATAN Place 1 drop into both eyes at bedtime.   nystatin-triamcinolone ointment Commonly known as: MYCOLOG Apply 1 Application topically 2 (two) times daily. Started by: Jacob Espy, MD   ProAir HFA 108 [phone] Base) MCG/ACT inhaler Generic drug: albuterol Inhale 1 puff into the lungs every 4 (four) hours as needed.   senna-docusate 8.6-50 MG tablet Commonly known as: Senokot-S Take 1 tablet by mouth 2 (two) times daily. While taking pain meds to prevent constipation What changed:  when to take this reasons to take this   triamcinolone cream 0.1 % Commonly known as: KENALOG SMARTSIG:sparingly Topical Twice Daily        Allergies:  Allergies  Allergen Reactions   Penicillins Hives    BLISTERS    Family History: Family History  Problem Relation Age of Onset   Heart attack Mother    Heart attack Father    Prostate cancer Neg Hx    Bladder Cancer Neg Hx    Kidney cancer Neg Hx     Social History:  reports that he quit smoking about 21 years ago. His smoking use included cigarettes. He has a 50.00 pack-year smoking history. He has never used smokeless tobacco. He reports that he does not drink alcohol and does not use drugs.   Physical Exam: BP 108/62   Pulse 80   Ht 5\' 9"  (1.753 m)   Wt 175 lb (79.4 kg)   BMI 25.84 kg/m   Constitutional:  Alert and oriented, No acute distress.  Accompanied today by son. GU: Phimosis appreciated with mild inflammation. HEENT: [place] AT, moist mucus membranes.  Trachea midline, no masses. Neurologic: Grossly intact, no focal deficits, moving all 4 extremities. Psychiatric: Normal mood and affect.  Laboratory Data: Lab Results  Component Value Date   WBC 5.9 10/28/2021   HGB 9.1 (L) 10/28/2021   HCT 28.6 (L) 10/28/2021   MCV 97.6 10/28/2021   PLT 206 10/28/2021    Lab Results  Component Value Date   CREATININE 1.94 (H) 10/28/2021    Pertinent Imaging: EXAM: CT ABDOMEN AND PELVIS WITHOUT  CONTRAST   TECHNIQUE: Multidetector CT imaging of the abdomen and pelvis was performed following the standard protocol without IV contrast.   RADIATION DOSE REDUCTION: This exam was performed according to the departmental dose-optimization program which includes automated exposure control, adjustment of the mA and/or kV according to patient size and/or use of iterative reconstruction technique.   COMPARISON:  January 25, 2021.   FINDINGS: Lower chest: Trace LEFT and small RIGHT-sided pleural fluid. Basilar scarring in the lingula and at the LEFT lung base. Cardiac pacer leads in the  RIGHT heart. Heart is incompletely imaged. No pericardial effusion. No chest wall abnormality.   Hepatobiliary: Lobulated area in the medial segment of the LEFT hepatic lobe (image 20/2) slightly more dense than surrounding liver 3.1 x 2.2 cm, unchanged dating back to November of 2012 and as far back as May of 2015, unchanged in size compared to the May of 2015 study but with increased density, shown to represent a cyst at the time of the 2015 evaluation. No pericholecystic stranding. No biliary duct dilation.   Pancreas: Pancreas with normal contours, no signs of inflammation or peripancreatic fluid.   Spleen: Normal size and contour, spleen has fall in into the LEFT nephrectomy bed.   Adrenals/Urinary Tract: RIGHT adrenal gland is normal. Status post LEFT nephrectomy. LEFT adrenal gland with small LEFT adrenal nodule which is unchanged over a series of prior studies measuring 10 Hounsfield units. Renal cortical scarring on the RIGHT which is moderate. Mildly patulous appearance of the RIGHT ureter without hydronephrosis and with partial reconstitution of the proximal loop of the RIGHT nephroureteral stent and full reconstitution of the distal loop. Proximal loop in anterior interpolar calices and distal loops well reconstituted in the urinary bladder. No signs of perivesical stranding. Small  amount of gas in the urinary bladder.   Stomach/Bowel: No acute gastrointestinal findings. Herniation of the mid transverse colon into the wide mouth ventral hernia and rectus diastasis in the mid abdomen (image 48/2) 6 cm defect in the abdominal wall. No acute gastric or small bowel process.   Vascular/Lymphatic: 3 cm x 3 cm infrarenal abdominal aortic aneurysm in the setting of moderate to marked calcified aortic atherosclerosis. Vascular structures not well assessed due to lack of intravenous contrast. No adenopathy in the retroperitoneum. No adenopathy in the pelvis.   Reproductive: Unremarkable by CT.   Other: Mild stranding in the presacral space and along the RIGHT pelvic sidewall and RIGHT retroperitoneum. Mild RIGHT perinephric stranding. No pneumoperitoneum. No ascites.   Musculoskeletal: Compression fracture at L4. This is new compared to imaging from January 25, 2021 and shows 60-70% loss of height. Mild retropulsion of posterior cortical elements along the superior endplate. Fracture involves superior endplate of L4. Alignment is unchanged and there are signs of cement augmentation at L2. Segmentation referenced above assumes 5 lumbar type vertebral bodies with L2 as the level previously treated with cement augmentation with transitional vertebral anatomy at S1.   IMPRESSION: 1. Status post LEFT nephrectomy. 2. Post nephroureteral stenting with stranding in the retroperitoneum and pelvis likely related to recent TURBT and ureteral manipulation. 3. Small amount of gas in the urinary bladder related to above procedure. 4. Compression fracture at "L4" is new compared to imaging from January 25, 2021 and shows 60-70% loss of height. Mild retropulsion of posterior cortical elements along the superior endplate. Segmentation assumes S1 is a transitional vertebra as outlined on previous CT of the lumbar spine from January 2023. 5. Trace LEFT and small RIGHT-sided pleural  fluid. 6. Herniation of the mid transverse colon into the wide mouth ventral hernia and rectus diastasis in the mid abdomen. 6 cm defect in the abdominal wall. 7. 3 cm x 3 cm infrarenal abdominal aortic aneurysm in the setting of moderate to marked calcified aortic atherosclerosis. Recommend follow-up every 3 years. 8. Stable hepatic lesion in the medial segment of the LEFT hepatic lobe in terms of size dating back to 2015 likely reflecting a hemorrhagic cyst, internal hemorrhage or complexity developing over time.   Aortic Atherosclerosis (ICD10-I70.0).  These results will be called to the ordering clinician or representative by the Radiologist Assistant, and communication documented in the PACS or [employer].   Electronically Signed: By: Zetta Bills M.D. On: 10/19/2021 11:43  Personally reviewed this study, agree with radiologic interpretation   Assessment & Plan:    Recurrent bladder cancer/right ureteral cancer - High non-invasive. Complicated by age, comorbidity and solitary kidney.  - I recommend return to the operating room for second look cystoscopy and ureteroscopy with possible intervention including further tumor ablation and stent replacement along intravascular gemcitabine. Risks and benefits discussed, bleeding, infection, stent pain.  -Following this, recommend another induction course of BCG with the stent in place given his multifocal complicated recurrence. -Information/ literature about BCG given today -Pending urinalysis / UCx preop  2. Phimosis  - Prescribed Nystatin ointment, apply BID     [facility] [address] [place], [place] [address] [phone]  I have reviewed the above documentation for accuracy and completeness, and I agree with the above.   Jacob Espy, MD  I spent 50 total minutes on the day of the encounter including pre-visit review of the medical record, face-to-face time with  the patient, and post visit ordering of labs/imaging/tests.

## 2021-12-17 ENCOUNTER — Other Ambulatory Visit: Payer: Self-pay | Admitting: Urology

## 2021-12-17 DIAGNOSIS — Z8551 Personal history of malignant neoplasm of bladder: Secondary | ICD-10-CM

## 2021-12-17 DIAGNOSIS — C661 Malignant neoplasm of right ureter: Secondary | ICD-10-CM

## 2021-12-17 NOTE — Progress Notes (Unsigned)
Surgical Physician Order Form [facility]  * Scheduling expectation : Next Available  *Length of Case:   *Clearance needed: no  *Anticoagulation Instructions: Hold all anticoagulants  *Aspirin Instructions: Ok to continue Aspirin  *Post-op visit Date/Instructions:   TBD  *Diagnosis:  bladder cancer, right ureteral tumor  *Procedure: right Ureteroscopy w/laser lithotripsy & stent exchange (41324), possible laser fulgeration of ureteral tumor   Additional orders: Gemcitabine 2000mg  bladder instillation  -Admit type: OUTpatient  -Anesthesia: General  -VTE Prophylaxis Standing Order SCD's       Other:   -Standing Lab Orders Per Anesthesia    Lab other: None  -Standing Test orders EKG/Chest x-ray per Anesthesia       Test other:   - Medications:  Ancef 2gm IV   ok with allergy, tolerated previously  -Other orders:  N/A

## 2021-12-21 LAB — CULTURE, URINE COMPREHENSIVE

## 2021-12-21 NOTE — Progress Notes (Signed)
   [facility]  Surgery Date: Date: 01/09/2022  Surgeon: Dr. [name], MD  Inpt ( No  )   Outpt (Yes)   Obs ( No  )   Diagnosis: Right Ureteral Cancer C66., History of Bladder Cancer Z85.51  -CPT: 76808, 81103, [phone]  Surgery: Right Ureteroscopy with Laser Lithotripsy and Stent Exchange, Intravesical Instillation of Gemcitabine, Possible Laser Fulguration of Left Ureteral Tumor  Stop Anticoagulations: Yes, may continue ASA  Cardiac/Medical/Pulmonary Clearance needed: no  *Orders entered into EPIC  Date: 12/21/21   *Case booked in [place]  Date: 12/14/2021  *Notified pt of Surgery: Date: 12/14/2021  PRE-OP UA & CX: no  *Placed into Prior Authorization Work [place] Date: 12/21/21  Assistant/laser/rep:No

## 2021-12-22 ENCOUNTER — Telehealth: Payer: Self-pay

## 2021-12-22 MED ORDER — AMOXICILLIN-POT CLAVULANATE 875-125 MG PO TABS: 1.0000 | ORAL_TABLET | Freq: Two times a day (BID) | ORAL | 0 refills | Status: DC

## 2021-12-22 NOTE — Telephone Encounter (Signed)
Spoke with pt. Son. Advised of needing to start medication prior to surgery on 01/04/2021. Medication has been sent in to pharmacy. Verbalized understanding.

## 2021-12-22 NOTE — Telephone Encounter (Signed)
-----   Message from [name], MD sent at 12/21/2021  4:49 PM EST ----- Please treat with Augmentin twice daily starting 5 days before the surgery for total of 7 days.  [name], MD

## 2021-12-30 NOTE — Progress Notes (Signed)
Remote pacemaker transmission.   

## 2022-01-03 ENCOUNTER — Encounter
Admission: RE | Admit: 2022-01-03 | Discharge: 2022-01-03 | Disposition: A | Discharge status: Home / Self Care | Payer: Medicare Other | Source: Ambulatory Visit | Attending: Urology | Admitting: Urology

## 2022-01-03 ENCOUNTER — Telehealth: Payer: Self-pay | Admitting: *Deleted

## 2022-01-03 ENCOUNTER — Encounter: Payer: Self-pay | Admitting: Internal Medicine

## 2022-01-03 ENCOUNTER — Other Ambulatory Visit: Payer: Self-pay

## 2022-01-03 HISTORY — DX: Dyspnea, unspecified: R06.00

## 2022-01-03 NOTE — Progress Notes (Signed)
[place] DEVICE PROGRAMMING   Patient Information: Name:  Jacob [name]  DOB:  Jun 27, 1928  MRN:  [id number]    Planned Procedure: CYSTOSCOPY/URETEROSCOPY/HOLMIUM LASER/STENT EXCHANGE  Surgeon:  Dr. [name], MD  Date of Procedure:  01/09/2022  Cautery will be used.  Device Information:   Clinic EP Physician:  Virl Axe, MD    Device Type:  Pacemaker Manufacturer and Phone #:  Medtronic: [id number] Pacemaker Dependent?:  Yes.   Date of Last Device Check:  12/10/21           Normal Device Function?:  Yes.     Electrophysiologist's Recommendations:   Have magnet available. Provide continuous ECG monitoring when magnet is used or reprogramming is to be performed.  Procedure may interfere with device function.  Magnet should be placed over device during procedure.  Per Device Clinic Standing Orders, Damian Leavell, RN  2:11 PM 01/03/2022

## 2022-01-03 NOTE — Patient Instructions (Addendum)
Your procedure is scheduled on: 01/09/22 - Monday Report to the Registration Desk on the 1st floor of the [facility]. To find out your arrival time, please call [phone] between 1PM - 3PM on: 01/06/22 - Friday If your arrival time is 6:00 am, do not arrive prior to that time as the [facility] entrance doors do not open until 6:00 am.  REMEMBER: Instructions that are not followed completely may result in serious medical risk, up to and including death; or upon the discretion of your surgeon and anesthesiologist your surgery may need to be rescheduled.  Do not eat food or drink any liquids after midnight the night before surgery.  No gum chewing, lozengers or hard candies.  TAKE THESE MEDICATIONS THE MORNING OF SURGERY WITH A SIP OF WATER:  - esomeprazole ([place]) - (take one the night before and one on the morning of surgery - helps to prevent nausea after surgery.) - BREZTRI AEROSPHERE  - amoxicillin-clavulanate (AUGMENTIN)   Use inhaler PROAIR HFA  on the day of surgery and bring to the hospital.  One week prior to surgery: Stop Anti-inflammatories (NSAIDS) such as Advil, Aleve, Ibuprofen, Motrin, Naproxen, Naprosyn and Aspirin based products such as Excedrin, Goodys Powder, BC Powder.  Stop ANY OVER THE COUNTER supplements until after surgery.Multiple Vitamin (MULTIVITAMIN) , Omega-3 Fatty Acids (FISH OIL).  You may however, continue to take Tylenol if needed for pain up until the day of surgery.  No Alcohol for 24 hours before or after surgery.  No Smoking including e-cigarettes for 24 hours prior to surgery.  No chewable tobacco products for at least 6 hours prior to surgery.  No nicotine patches on the day of surgery.  Do not use any "recreational" drugs for at least a week prior to your surgery.  Please be advised that the combination of cocaine and anesthesia may have negative outcomes, up to and including death. If you test positive for cocaine, your surgery will  be cancelled.  On the morning of surgery brush your teeth with toothpaste and water, you may rinse your mouth with mouthwash if you wish. Do not swallow any toothpaste or mouthwash.  Do not wear jewelry, make-up, hairpins, clips or nail polish.  Do not wear lotions, powders, or perfumes.   Do not shave body from the neck down 48 hours prior to surgery just in case you cut yourself which could leave a site for infection. Also, freshly shaved skin may become irritated if using the CHG soap.  Contact lenses, hearing aids and dentures may not be worn into surgery.  Do not bring valuables to the hospital. [facility] is not responsible for any missing/lost belongings or valuables.   Notify your doctor if there is any change in your medical condition (cold, fever, infection).  Wear comfortable clothing (specific to your surgery type) to the hospital.  After surgery, you can help prevent lung complications by doing breathing exercises.  Take deep breaths and cough every 1-2 hours. Your doctor may order a device called an Incentive Spirometer to help you take deep breaths. When coughing or sneezing, hold a pillow firmly against your incision with both hands. This is called "splinting." Doing this helps protect your incision. It also decreases belly discomfort.  If you are being admitted to the hospital overnight, leave your suitcase in the car. After surgery it may be brought to your room.  If you are being discharged the day of surgery, you will not be allowed to drive home. You  will need a responsible adult (18 years or older) to drive you home and stay with you that night.   If you are taking public transportation, you will need to have a responsible adult (18 years or older) with you. Please confirm with your physician that it is acceptable to use public transportation.   Please call the [facility] Dept. at [phone] if you have any questions about these  instructions.  Surgery Visitation Policy:  Patients undergoing a surgery or procedure may have two family members or support persons with them as long as the person is not COVID-19 positive or experiencing its symptoms.   Inpatient Visitation:    Visiting hours are 7 a.m. to 8 p.m. Up to four visitors are allowed at one time in a patient room. The visitors may rotate out with other people during the day. One designated support person (adult) may remain overnight.  Due to an increase in RSV and influenza rates and associated hospitalizations, children ages 59 and under will not be able to visit patients in [facility]. Masks continue to be strongly recommended.

## 2022-01-03 NOTE — Telephone Encounter (Signed)
Pt has been scheduled for tele pre op appt 01/06/22 @ 9:40. Med rec and consent are done. Pt's primary # to call has been changed to his # [phone], not the son's phone #.     Patient Consent for Virtual Visit        Jacob [name] has provided verbal consent on 01/03/2022 for a virtual visit (video or telephone).   CONSENT FOR VIRTUAL VISIT FOR:  Jacob [name]  By participating in this virtual visit I agree to the following:  I hereby voluntarily request, consent and authorize [facility] and its employed or contracted physicians, physician assistants, nurse practitioners or other licensed health care professionals (the Practitioner), to provide me with telemedicine health care services (the "Services") as deemed necessary by the treating Practitioner. I acknowledge and consent to receive the Services by the Practitioner via telemedicine. I understand that the telemedicine visit will involve communicating with the Practitioner through live audiovisual communication technology and the disclosure of certain medical information by electronic transmission. I acknowledge that I have been given the opportunity to request an in-person assessment or other available alternative prior to the telemedicine visit and am voluntarily participating in the telemedicine visit.  I understand that I have the right to withhold or withdraw my consent to the use of telemedicine in the course of my care at any time, without affecting my right to future care or treatment, and that the Practitioner or I may terminate the telemedicine visit at any time. I understand that I have the right to inspect all information obtained and/or recorded in the course of the telemedicine visit and may receive copies of available information for a reasonable fee.  I understand that some of the potential risks of receiving the Services via telemedicine include:  Delay or interruption in medical evaluation due to technological  equipment failure or disruption; Information transmitted may not be sufficient (e.g. poor resolution of images) to allow for appropriate medical decision making by the Practitioner; and/or  In rare instances, security protocols could fail, causing a breach of personal health information.  Furthermore, I acknowledge that it is my responsibility to provide information about my medical history, conditions and care that is complete and accurate to the best of my ability. I acknowledge that Practitioner's advice, recommendations, and/or decision may be based on factors not within their control, such as incomplete or inaccurate data provided by me or distortions of diagnostic images or specimens that may result from electronic transmissions. I understand that the practice of medicine is not an exact science and that Practitioner makes no warranties or guarantees regarding treatment outcomes. I acknowledge that a copy of this consent can be made available to me via my patient portal ([facility]), or I can request a printed copy by calling the office of [facility].    I understand that my insurance will be billed for this visit.   I have read or had this consent read to me. I understand the contents of this consent, which adequately explains the benefits and risks of the Services being provided via telemedicine.  I have been provided ample opportunity to ask questions regarding this consent and the Services and have had my questions answered to my satisfaction. I give my informed consent for the services to be provided through the use of telemedicine in my medical care

## 2022-01-03 NOTE — Telephone Encounter (Signed)
   Name: Jacob [name]  DOB: Feb 13, 1928  MRN: [id number]  Primary Cardiologist: Virl Axe, MD   Preoperative team, please contact this patient and set up a phone call appointment for further preoperative risk assessment. Please obtain consent and complete medication review. Thank you for your help.     Ledora Bottcher, PA 01/03/2022, 3:35 PM [place]

## 2022-01-03 NOTE — Telephone Encounter (Signed)
Pt has been scheduled for tele pre op appt 01/06/22 @ 9:40. Med rec and consent are done. Pt's primary # to call has been changed to his # [phone], not the son's phone #.

## 2022-01-03 NOTE — Telephone Encounter (Signed)
   Pre-operative Risk Assessment    Patient Name: Jacob [name]  DOB: 07/13/1928 MRN: [id number]      Request for Surgical Clearance    Procedure:   URETEROSCOPY/LASER LITHOTRIPSY/STENT   Date of Surgery:  Clearance 01/09/22                                 Surgeon:  DR. [name] Surgeon's Group or Practice Name:  [facility] Phone number:  [phone] Fax number:  [phone]   Type of Clearance Requested:   - Medical ; ASA; BUT NOT LISTED AS NEEDING TO BE HELD   Type of Anesthesia:  General    Additional requests/questions:    Jiles Prows   01/03/2022, 3:17 PM

## 2022-01-06 ENCOUNTER — Ambulatory Visit: Payer: Medicare Other | Attending: Cardiology | Admitting: Nurse Practitioner

## 2022-01-06 ENCOUNTER — Encounter: Payer: Self-pay | Admitting: Nurse Practitioner

## 2022-01-06 DIAGNOSIS — Z01810 Encounter for preprocedural cardiovascular examination: Secondary | ICD-10-CM | POA: Diagnosis not present

## 2022-01-06 MED ORDER — GEMCITABINE CHEMO FOR BLADDER INSTILLATION 2000 MG
2000.0000 mg | Freq: Once | INTRAVENOUS | Status: DC
  Filled 2022-01-06: qty 52.6

## 2022-01-06 NOTE — Progress Notes (Signed)
Virtual Visit via Telephone Note   Because of [name] co-morbid illnesses, he is at least at moderate risk for complications without adequate follow up.  This format is felt to be most appropriate for this patient at this time.  The patient did not have access to video technology/had technical difficulties with video requiring transitioning to audio format only (telephone).  All issues noted in this document were discussed and addressed.  No physical exam could be performed with this format.  Please refer to the patient's chart for his consent to telehealth for [facility].  Evaluation Performed:  Preoperative cardiovascular risk assessment _____________   Date:  01/06/2022   Patient ID:  Jacob [name], DOB 17-Mar-1928, MRN [id number] Patient Location:  Home Provider location:   Office  Primary Care Provider:  [name], MD Primary Cardiologist:  Virl Axe, MD  Chief Complaint / Patient Profile   89 y.o. y/o male with a h/o symptomatic bradycardia, CHB s/p PPM, NSVT, frequent PVCs, CAD s/p DESx2-RCA in 2006, hypertension, hyperlipidemia, orthostatic lightheadedness, type 2 diabetes, and COPD who is pending ureteroscopy/laser lithotripsy/stent on 01/09/2022 with Dr. [name] of [facility] urological and presents today for telephonic preoperative cardiovascular risk assessment.  History of Present Illness    Jacob [name] is a 89 y.o. male who presents via audio/video conferencing for a telehealth visit today.  Pt was last seen in cardiology clinic on 09/27/2021 by Dr. Caryl Comes.  At that time ZYLAN ALMQUIST was doing well. The patient is now pending procedure as outlined above. Since his last visit, he has done well from a cardiac standpoint.   He denies chest pain, palpitations, dyspnea, pnd, orthopnea, n, v, dizziness, syncope, edema, weight gain, or early satiety. All other systems reviewed and are otherwise negative except as noted  above.   Past Medical History    Past Medical History:  Diagnosis Date   Anemia    Arthritis of both hands    BPH (benign prostatic hypertrophy)    COPD (chronic obstructive pulmonary disease) (HCC)    Coronary artery disease    a.) LHC 11/18/2004: 40% pLAD, 50% mLAD, 80% D1, 50% mLCx, 100% mRCA - refer to [facility] for PCI; b.) LHC/PCI 11/21/2004: 100% mRCA (3.5 x 23 mm Cypher DES), 95% dRCA (2.5 x 28 mm Cypher); b.) 05/03/12 Cardiac CT: patent RCA stents, nonobs dzs;  c.) 01/2013 Ex MV: nl EF, no ischemia.   Diastolic dysfunction    a.) TTE 08/29/2013: EF 60-65%, mild LVH, mild BAE, mild MR, mod TR, G1DD; b.) TTE 07/01/2020: EF 50-55%, LAEm triv MR, mild-mod Ao sclerosis with no stenosis, G2DD   Dyspnea    GERD (gastroesophageal reflux disease)    Glaucoma of both eyes    H/O hiatal hernia    History of atrial fibrillation 2006   History of diverticulitis    History of kidney stones    History of renal cell carcinoma    a.) s/p LEFT nephroureterectomy   Hyperlipidemia    Hypertension    LBBB (left bundle branch block)    Mobitz type 2 second degree AV block    a.) s/p PPM placement 08/29/2013   Pneumonia    Presence of permanent cardiac pacemaker    Recurrent bladder transitional cell carcinoma ([place])    a.) s/p TURBT and intravesical chemotherapy   Renal insufficiency    a. Creat rose to 2.34 post-op L nephrectomy.   S/P placement of cardiac pacemaker 08/29/2013  a.) Medtroinc device placed for symptomatic bradycardia and mobitz 2 second degree heart block   Symptomatic bradycardia    a.) s/p PPM placement 08/29/2013   Type 2 diabetes mellitus without complication, without long-term current use of insulin ([place]) 08/29/2017   Past Surgical History:  Procedure Laterality Date   APPENDECTOMY  02/03/1988   BLADDER INSTILLATION N/A 10/17/2021   Procedure: BLADDER INSTILLATION OF GEMCITABINE;  Surgeon: [name], MD;  Location: [facility];  Service: Urology;  Laterality: N/A;    CATARACT EXTRACTION W/ INTRAOCULAR LENS  IMPLANT, BILATERAL  01/02/2009   COLONOSCOPY     CORONARY ANGIOPLASTY WITH STENT PLACEMENT  11/21/2004   Procedure: CORONARY ANGIOPLASTY WITH STENT PLACEMENT (mRCA and dRCA); Location: [facility]; Surgeon: Maryjean Morn, MD   CYSTOSCOPY W/ RETROGRADES Right 01/13/2014   Procedure: CYSTOSCOPY WITH RETROGRADE PYELOGRAM;  Surgeon: [name], MD;  [facility];  Service: Urology;  Laterality: Right;   CYSTOSCOPY W/ URETERAL STENT PLACEMENT Left 08/22/2013   Procedure: CYSTOSCOPY WITH RETROGRADE PYELOGRAM/URETERAL STENT PLACEMENT;  Surgeon: [name], MD;  Location: [facility];  Service: Urology;  Laterality: Left;   CYSTOSCOPY W/ URETERAL STENT PLACEMENT Right 10/17/2021   Procedure: CYSTOSCOPY WITH RETROGRADE PYELOGRAM/URETERAL STENT PLACEMENT;  Surgeon: [name], MD;  Location: [facility];  Service: Urology;  Laterality: Right;   CYSTOSCOPY WITH BIOPSY  04/13/2011   Procedure: CYSTOSCOPY WITH BIOPSY;  Surgeon: [name], MD;  Location: [facility];  Service: Urology;  Laterality: N/A;  Cold Cup Biopsy   CYSTOSCOPY WITH RETROGRADE PYELOGRAM, URETEROSCOPY AND STENT PLACEMENT Left 06/19/2013   Procedure: CYSTOSCOPY WITH LEFT RETROGRADE PYELOGRAM, LEFT URETEROSCOPY, ureteral balloon dilatation, BIOPSY LEFT KIDNEY;  Surgeon: [name], MD;  Location: [facility];  Service: Urology;  Laterality: Left;   EYE SURGERY     INGUINAL HERNIA REPAIR Left 08/22/2013   Procedure: LAPAROSCOPIC INGUINAL HERNIA;  Surgeon: [name], MD;  Location: [facility];  Service: Urology;  Laterality: Left;   INSERT / REPLACE / REMOVE PACEMAKER     IR KYPHO LUMBAR INC FX REDUCE BONE BX UNI/BIL CANNULATION INC/IMAGING  01/31/2021   IR KYPHO LUMBAR INC FX REDUCE BONE BX UNI/BIL CANNULATION INC/IMAGING  10/24/2021   LEFT HEART CATH AND CORONARY ANGIOGRAPHY Left 11/18/2004   Procedure: LEFT HEART CATH AND CORONARY ANGIOGRAPHY;  Location: [facility]; Surgeon: Neoma Laming, MD   PERMANENT PACEMAKER INSERTION N/A 08/29/2013   Procedure: PERMANENT PACEMAKER INSERTION;  Surgeon: [name], MD;  Location: [facility] CATH LAB;  Service: Cardiovascular;  Laterality: N/A;  Medtronic dual-chamber   ROBOT ASSITED LAPAROSCOPIC NEPHROURETERECTOMY Left 08/22/2013   Procedure: ROBOT ASSITED LAPAROSCOPIC NEPHROURETERECTOMY;  Surgeon: [name], MD;  Location: [facility];  Service: Urology;  Laterality: Left;   TRANSURETHRAL RESECTION OF BLADDER TUMOR  12/22/2010   TRANSURETHRAL RESECTION OF BLADDER TUMOR  08/04/2011   Procedure: TRANSURETHRAL RESECTION OF BLADDER TUMOR (TURBT);  Surgeon: [name], MD;  Location: [facility];  Service: Urology;  Laterality: N/A;   TRANSURETHRAL RESECTION OF BLADDER TUMOR N/A 01/13/2014   Procedure: TRANSURETHRAL RESECTION OF BLADDER TUMOR (TURBT);  Surgeon: [name], MD;  Location: [facility];  Service: Urology;  Laterality: N/A;   TRANSURETHRAL RESECTION OF BLADDER TUMOR  10/17/2021   Procedure: TRANSURETHRAL RESECTION OF BLADDER TUMOR (TURBT);  Surgeon: [name], MD;  Location: [facility];  Service: Urology;;   URETERAL BIOPSY  10/17/2021   Procedure: URETERAL BIOPSY;  Surgeon: [name], MD;  Location: [facility];  Service: Urology;;  URETEROSCOPY  10/17/2021   Procedure: URETEROSCOPY;  Surgeon: [name], MD;  Location: [facility];  Service: Urology;;    Allergies  Allergies  Allergen Reactions   Penicillins Hives    BLISTERS    Home Medications    Prior to Admission medications   Medication Sig Start Date End Date Taking? Authorizing Provider  acetaminophen (TYLENOL) 325 MG tablet Take 650 mg by mouth every 4 (four) hours as needed.    [provider]  amoxicillin-clavulanate (AUGMENTIN) 875-125 MG tablet Take 1 tablet by mouth every 12 (twelve) hours. 12/22/21   [name], MD  aspirin 81 MG tablet Take 81 mg by mouth  daily.    [provider]  atorvastatin (LIPITOR) 40 MG tablet TAKE 1 TABLET BY MOUTH ONCE DAILY. APPOINTMENT NEEDED FOR FURTHER REFILLS. 12/08/21   Deboraha Sprang, MD  Budeson-Glycopyrrol-Formoterol (BREZTRI AEROSPHERE) 160-9-4.8 MCG/ACT AERO Inhale into the lungs in the morning and at bedtime.    [provider]  cetirizine (ZYRTEC) 10 MG tablet Take 10 mg by mouth as needed.    [provider]  esomeprazole (NEXIUM) 20 MG capsule Take 20 mg by mouth as needed. 05/24/20   [provider]  latanoprost (XALATAN) 0.005 % ophthalmic solution Place 1 drop into both eyes at bedtime.    [provider]  Multiple Vitamin (MULTIVITAMIN) tablet Take 1 tablet by mouth daily.    [provider]  nystatin-triamcinolone ointment (MYCOLOG) Apply 1 Application topically 2 (two) times daily. Patient taking differently: Apply 1 Application topically daily. 12/15/21   [name], MD  Omega-3 Fatty Acids (FISH OIL) 1200 MG CAPS Take by mouth daily.    [provider]  PROAIR HFA 108 (90 Base) MCG/ACT inhaler Inhale 1 puff into the lungs every 4 (four) hours as needed.  06/14/16   [provider]  senna-docusate (SENOKOT-S) 8.6-50 MG per tablet Take 1 tablet by mouth 2 (two) times daily. While taking pain meds to prevent constipation Patient taking differently: Take 1 tablet by mouth at bedtime as needed. While taking pain meds to prevent constipation 08/26/13   [name], MD  triamcinolone cream (KENALOG) 0.1 % 2 (two) times daily. 10/11/21   [provider]    Physical Exam    Vital Signs:  [name] does not have vital signs available for review today.  Given telephonic nature of communication, physical exam is limited. AAOx3. NAD. Normal affect.  Speech and respirations are unlabored.  Accessory Clinical Findings    None  Assessment & Plan    1.  Preoperative Cardiovascular Risk Assessment:  According to  the Revised Cardiac Risk Index (RCRI), his Perioperative Risk of Major Cardiac Event is (%): 0.9. His Functional Capacity in METs is: 4.64 according to the [facility] Activity Status Index (DASI). Therefore, based on ACC/AHA guidelines, patient would be at acceptable risk for the planned procedure without further cardiovascular testing.  The patient was advised that if he develops new symptoms prior to surgery to contact our office to arrange for a follow-up visit, and he verbalized understanding.  Regarding ASA therapy, we recommend continuation of ASA throughout the perioperative period.  However, if the surgeon feels that cessation of ASA is required in the perioperative period, it may be stopped 5-7 days prior to surgery with a plan to resume it as soon as felt to be feasible from a surgical standpoint in the post-operative period.   A copy of this note will be routed to requesting surgeon.  Time:   Today, I have spent 6 minutes with the patient with telehealth technology discussing medical history, symptoms, and management plan.     Lenna Sciara, NP  01/06/2022, 9:42 AM

## 2022-01-08 MED ORDER — CEFAZOLIN SODIUM-DEXTROSE 2-4 GM/100ML-% IV SOLN
2.0000 g | INTRAVENOUS | Status: AC
  Administered 2022-01-09: 2 g via INTRAVENOUS

## 2022-01-08 MED ORDER — ORAL CARE MOUTH RINSE: 15.0000 mL | Freq: Once | OROMUCOSAL | Status: AC

## 2022-01-08 MED ORDER — CHLORHEXIDINE GLUCONATE 0.12 % MT SOLN: 15.0000 mL | Freq: Once | OROMUCOSAL | Status: AC

## 2022-01-08 MED ORDER — LACTATED RINGERS IV SOLN: INTRAVENOUS | Status: DC

## 2022-01-09 ENCOUNTER — Ambulatory Visit: Payer: Medicare Other

## 2022-01-09 ENCOUNTER — Other Ambulatory Visit: Payer: Self-pay

## 2022-01-09 ENCOUNTER — Ambulatory Visit
Admission: RE | Admit: 2022-01-09 | Discharge: 2022-01-09 | Disposition: A | Discharge status: Home / Self Care | Payer: Medicare Other | Attending: Urology | Admitting: Urology

## 2022-01-09 ENCOUNTER — Encounter: Payer: Self-pay | Admitting: Urology

## 2022-01-09 ENCOUNTER — Ambulatory Visit: Payer: Medicare Other | Admitting: Anesthesiology

## 2022-01-09 ENCOUNTER — Encounter
Admission: RE | Disposition: A | Discharge status: Home / Self Care | Payer: Self-pay | Source: Home / Self Care | Attending: Urology

## 2022-01-09 DIAGNOSIS — K449 Diaphragmatic hernia without obstruction or gangrene: Secondary | ICD-10-CM | POA: Diagnosis not present

## 2022-01-09 DIAGNOSIS — Z87891 Personal history of nicotine dependence: Secondary | ICD-10-CM | POA: Insufficient documentation

## 2022-01-09 DIAGNOSIS — Z8551 Personal history of malignant neoplasm of bladder: Secondary | ICD-10-CM

## 2022-01-09 DIAGNOSIS — K219 Gastro-esophageal reflux disease without esophagitis: Secondary | ICD-10-CM | POA: Insufficient documentation

## 2022-01-09 DIAGNOSIS — I2510 Atherosclerotic heart disease of native coronary artery without angina pectoris: Secondary | ICD-10-CM | POA: Diagnosis not present

## 2022-01-09 DIAGNOSIS — J449 Chronic obstructive pulmonary disease, unspecified: Secondary | ICD-10-CM | POA: Diagnosis not present

## 2022-01-09 DIAGNOSIS — C61 Malignant neoplasm of prostate: Secondary | ICD-10-CM | POA: Diagnosis not present

## 2022-01-09 DIAGNOSIS — Q600 Renal agenesis, unilateral: Secondary | ICD-10-CM | POA: Diagnosis not present

## 2022-01-09 DIAGNOSIS — N400 Enlarged prostate without lower urinary tract symptoms: Secondary | ICD-10-CM | POA: Insufficient documentation

## 2022-01-09 DIAGNOSIS — C661 Malignant neoplasm of right ureter: Secondary | ICD-10-CM | POA: Diagnosis not present

## 2022-01-09 DIAGNOSIS — I10 Essential (primary) hypertension: Secondary | ICD-10-CM | POA: Diagnosis not present

## 2022-01-09 DIAGNOSIS — Z955 Presence of coronary angioplasty implant and graft: Secondary | ICD-10-CM | POA: Diagnosis not present

## 2022-01-09 DIAGNOSIS — Z950 Presence of cardiac pacemaker: Secondary | ICD-10-CM | POA: Insufficient documentation

## 2022-01-09 HISTORY — PX: CYSTOSCOPY/URETEROSCOPY/HOLMIUM LASER/STENT PLACEMENT: SHX6546

## 2022-01-09 HISTORY — PX: CYSTOSCOPY WITH FULGERATION: SHX6638

## 2022-01-09 HISTORY — PX: BLADDER INSTILLATION: SHX6893

## 2022-01-09 SURGERY — CYSTOSCOPY/URETEROSCOPY/HOLMIUM LASER/STENT PLACEMENT
Anesthesia: General | Laterality: Right

## 2022-01-09 MED ORDER — FENTANYL CITRATE (PF) 100 MCG/2ML IJ SOLN
INTRAMUSCULAR | Status: AC
  Filled 2022-01-09: qty 2

## 2022-01-09 MED ORDER — CHLORHEXIDINE GLUCONATE 0.12 % MT SOLN
OROMUCOSAL | Status: AC
  Administered 2022-01-09: 15 mL via OROMUCOSAL
  Filled 2022-01-09: qty 15

## 2022-01-09 MED ORDER — PROPOFOL 10 MG/ML IV BOLUS
INTRAVENOUS | Status: AC
  Filled 2022-01-09: qty 20

## 2022-01-09 MED ORDER — CEFAZOLIN SODIUM-DEXTROSE 2-4 GM/100ML-% IV SOLN
INTRAVENOUS | Status: AC
  Filled 2022-01-09: qty 100

## 2022-01-09 MED ORDER — PHENYLEPHRINE 80 MCG/ML (10ML) SYRINGE FOR IV PUSH (FOR BLOOD PRESSURE SUPPORT)
PREFILLED_SYRINGE | INTRAVENOUS | Status: AC
  Filled 2022-01-09: qty 10

## 2022-01-09 MED ORDER — LIDOCAINE HCL (CARDIAC) PF 100 MG/5ML IV SOSY
PREFILLED_SYRINGE | INTRAVENOUS | Status: DC | PRN
  Administered 2022-01-09: 40 mg via INTRAVENOUS

## 2022-01-09 MED ORDER — ONDANSETRON HCL 4 MG/2ML IJ SOLN
INTRAMUSCULAR | Status: AC
  Filled 2022-01-09: qty 2

## 2022-01-09 MED ORDER — LIDOCAINE HCL (PF) 2 % IJ SOLN
INTRAMUSCULAR | Status: AC
  Filled 2022-01-09: qty 5

## 2022-01-09 MED ORDER — ONDANSETRON HCL 4 MG/2ML IJ SOLN
INTRAMUSCULAR | Status: DC | PRN
  Administered 2022-01-09: 4 mg via INTRAVENOUS

## 2022-01-09 MED ORDER — SODIUM CHLORIDE 0.9 % IR SOLN
Status: DC | PRN
  Administered 2022-01-09: 3000 mL via INTRAVESICAL

## 2022-01-09 MED ORDER — IOHEXOL 180 MG/ML  SOLN
INTRAMUSCULAR | Status: DC | PRN
  Administered 2022-01-09: 20 mL

## 2022-01-09 MED ORDER — LACTATED RINGERS IV SOLN: INTRAVENOUS | Status: DC | PRN

## 2022-01-09 MED ORDER — OXYCODONE HCL 5 MG/5ML PO SOLN: 5.0000 mg | Freq: Once | ORAL | Status: AC | PRN

## 2022-01-09 MED ORDER — FENTANYL CITRATE (PF) 100 MCG/2ML IJ SOLN: 25.0000 ug | INTRAMUSCULAR | Status: DC | PRN

## 2022-01-09 MED ORDER — FENTANYL CITRATE (PF) 100 MCG/2ML IJ SOLN
INTRAMUSCULAR | Status: DC | PRN
  Administered 2022-01-09 (×2): 25 ug via INTRAVENOUS
  Administered 2022-01-09: 50 ug via INTRAVENOUS

## 2022-01-09 MED ORDER — OXYCODONE HCL 5 MG PO TABS
ORAL_TABLET | ORAL | Status: AC
  Filled 2022-01-09: qty 1

## 2022-01-09 MED ORDER — PROPOFOL 10 MG/ML IV BOLUS
INTRAVENOUS | Status: DC | PRN
  Administered 2022-01-09: 90 mg via INTRAVENOUS

## 2022-01-09 MED ORDER — PHENYLEPHRINE HCL (PRESSORS) 10 MG/ML IV SOLN
INTRAVENOUS | Status: DC | PRN
  Administered 2022-01-09 (×3): 80 ug via INTRAVENOUS

## 2022-01-09 MED ORDER — OXYCODONE HCL 5 MG PO TABS
5.0000 mg | ORAL_TABLET | Freq: Once | ORAL | Status: AC | PRN
  Administered 2022-01-09: 5 mg via ORAL

## 2022-01-09 SURGICAL SUPPLY — 38 items
ADH LQ OCL WTPRF AMP STRL LF (MISCELLANEOUS)
ADHESIVE MASTISOL STRL (MISCELLANEOUS) IMPLANT
BAG DRAIN SIEMENS DORNER NS (MISCELLANEOUS) ×2 IMPLANT
BAG DRN NS LF (MISCELLANEOUS) ×2
BASKET ZERO TIP 1.9FR (BASKET) IMPLANT
BRUSH SCRUB EZ 1% IODOPHOR (MISCELLANEOUS) ×2 IMPLANT
BSKT STON RTRVL ZERO TP 1.9FR (BASKET)
CATH URET FLEX-TIP 2 LUMEN 10F (CATHETERS) IMPLANT
CATH URETL OPEN 5X70 (CATHETERS) ×2 IMPLANT
CNTNR SPEC 2.5X3XGRAD LEK (MISCELLANEOUS)
CONT SPEC 4OZ STRL OR WHT (MISCELLANEOUS)
CONTAINER SPEC 2.5X3XGRAD LEK (MISCELLANEOUS) IMPLANT
DRAPE UTILITY 15X26 TOWEL STRL (DRAPES) ×2 IMPLANT
DRSG TEGADERM 2-3/8X2-3/4 SM (GAUZE/BANDAGES/DRESSINGS) IMPLANT
ELECT REM PT RETURN 9FT ADLT (ELECTROSURGICAL) ×2
ELECTRODE REM PT RTRN 9FT ADLT (ELECTROSURGICAL) ×2 IMPLANT
FIBER LASER MOSES 200 DFL (Laser) IMPLANT
GAUZE 4X4 16PLY ~~LOC~~+RFID DBL (SPONGE) ×4 IMPLANT
GLOVE BIO SURGEON STRL SZ 6.5 (GLOVE) ×2 IMPLANT
GLOVE BIOGEL M 6.5 STRL (GLOVE) ×2 IMPLANT
GOWN STRL REUS W/ TWL LRG LVL3 (GOWN DISPOSABLE) ×4 IMPLANT
GOWN STRL REUS W/TWL LRG LVL3 (GOWN DISPOSABLE) ×4
GUIDEWIRE GREEN .038 145CM (MISCELLANEOUS) IMPLANT
GUIDEWIRE STR DUAL SENSOR (WIRE) ×2 IMPLANT
IV NS IRRIG 3000ML ARTHROMATIC (IV SOLUTION) ×2 IMPLANT
KIT TURNOVER CYSTO (KITS) ×2 IMPLANT
MANIFOLD NEPTUNE II (INSTRUMENTS) ×2 IMPLANT
PACK CYSTO AR (MISCELLANEOUS) ×2 IMPLANT
SET CYSTO W/LG BORE CLAMP LF (SET/KITS/TRAYS/PACK) ×2 IMPLANT
SHEATH NAVIGATOR HD 12/14X36 (SHEATH) IMPLANT
STENT URET 6FRX24 CONTOUR (STENTS) IMPLANT
STENT URET 6FRX26 CONTOUR (STENTS) IMPLANT
STENT URO INLAY 6FRX26CM (STENTS) IMPLANT
SURGILUBE 2OZ TUBE FLIPTOP (MISCELLANEOUS) ×2 IMPLANT
TRAP FLUID SMOKE EVACUATOR (MISCELLANEOUS) ×2 IMPLANT
WATER STERILE IRR 1000ML POUR (IV SOLUTION) ×2 IMPLANT
WATER STERILE IRR 3000ML UROMA (IV SOLUTION) ×2 IMPLANT
WATER STERILE IRR 500ML POUR (IV SOLUTION) ×2 IMPLANT

## 2022-01-09 NOTE — Anesthesia Procedure Notes (Signed)
Procedure Name: LMA Insertion Date/Time: 01/09/2022 3:44 PM  Performed by: Cammie Sickle, CRNAPre-anesthesia Checklist: Patient identified, Patient being monitored, Timeout performed, Emergency Drugs available and Suction available Patient Re-evaluated:Patient Re-evaluated prior to induction Oxygen Delivery Method: Circle system utilized Preoxygenation: Pre-oxygenation with 100% oxygen Induction Type: IV induction Ventilation: Mask ventilation without difficulty LMA: LMA inserted LMA Size: 4.0 Tube type: Oral Number of attempts: 1 Placement Confirmation: positive ETCO2 and breath sounds checked- equal and bilateral Tube secured with: Tape Dental Injury: Teeth and Oropharynx as per pre-operative assessment

## 2022-01-09 NOTE — Transfer of Care (Signed)
Immediate Anesthesia Transfer of Care Note  Patient: ABUBAKAR CRISPO  Procedure(s) Performed: CYSTOSCOPY/URETEROSCOPY/HOLMIUM LASER/STENT EXCHANGE (Right) BLADDER INSTILLATION OF GEMCITABINE CYSTOSCOPY WITH LASER FULGERATION OF URETERAL TUMOR (Right)  Patient Location: PACU  Anesthesia Type:General  Level of Consciousness: drowsy  Airway & Oxygen Therapy: Patient Spontanous Breathing and Patient connected to face mask oxygen  Post-op Assessment: Report given to RN and Post -op Vital signs reviewed and stable  Post vital signs: Reviewed and stable  Last Vitals:  Vitals Value Taken Time  BP 148/59 01/09/22 1645  Temp 35.8 1644  Pulse 68 01/09/22 1645  Resp 14 01/09/22 1645  SpO2 100 % 01/09/22 1645  Vitals shown include unvalidated device data.  Last Pain:  Vitals:   01/09/22 1335  TempSrc: Temporal  PainSc: 8          Complications: No notable events documented.

## 2022-01-09 NOTE — Discharge Instructions (Signed)
AMBULATORY SURGERY  ?DISCHARGE INSTRUCTIONS ? ? ?The drugs that you were given will stay in your system until tomorrow so for the next 24 hours you should not: ? ?Drive an automobile ?Make any legal decisions ?Drink any alcoholic beverage ? ? ?You may resume regular meals tomorrow.  Today it is better to start with liquids and gradually work up to solid foods. ? ?You may eat anything you prefer, but it is better to start with liquids, then soup and crackers, and gradually work up to solid foods. ? ? ?Please notify your doctor immediately if you have any unusual bleeding, trouble breathing, redness and pain at the surgery site, drainage, fever, or pain not relieved by medication. ? ? ? ?Additional Instructions: ? ? ? ?Please contact your physician with any problems or Same Day Surgery at [phone], Monday through Friday 6 am to 4 pm, or [place] [facility] Main number at [phone].  ?

## 2022-01-09 NOTE — Op Note (Signed)
Date of procedure: 01/09/22  Preoperative diagnosis:  Solitary right kidney History of bladder cancer High-grade right distal ureteral TCC  Postoperative diagnosis:  Same as above  Procedure: Right ureteroscopy with laser ablation of tumor Right retrograde pyelogram Right ureter stent exchange Interpretation of fluoroscopy less than 30 minutes Instillation of intravesical gemcitabine  Surgeon: [name], MD  Anesthesia: General  Complications: None  Intraoperative findings: Several centimeter filling defect in the right distal ureter just below the level of the iliacs as seen on previous retrograde.  Fuller mucosa at this level with similar appearance to previous ureteroscopy, unclear whether this represented tumor recurrence versus postprocedural edema.  Same area was laser fulgurated today.  Bard Optima stent placed with instillation of gemcitabine for the purpose of reflux.  EBL: Minimal  Specimens: None  Drains: 6 x 26 French double-J ureteral stent on right, Bard Optima  Indication: Jacob [name] is a 89 y.o. patient with solitary right kidney found to have right distal ureteral high-grade TCC who returns today for second look ureteroscopy.  After reviewing the management options for treatment, he elected to proceed with the above surgical procedure(s). We have discussed the potential benefits and risks of the procedure, side effects of the proposed treatment, the likelihood of the patient achieving the goals of the procedure, and any potential problems that might occur during the procedure or recuperation. Informed consent has been obtained.  Description of procedure:  The patient was taken to the operating room and general anesthesia was induced.  The patient was placed in the dorsal lithotomy position, prepped and draped in the usual sterile fashion, and preoperative antibiotics were administered. A preoperative time-out was performed.   A 21 French scope was  advanced per urethra into the bladder.  Attention was turned to the right side where ureteral stent was seen emanating.  It was moderately encrusted.  Stent graspers were used to bring the stent to level the urethral meatus.  It was then cannulated however unsuccessfully due to encrustation.  Ended up having to remove the stent and used a open-ended ureteral catheter within the UO to advance the safety wire up to the level of the kidney.  An open-ended ureteral catheter was used just within the UO and a gentle retrograde pyelogram was performed on the side.  There was a similar filling defect, 7 cm in length at the level of the iliacs in the right distal ureter where there is almost like an apple core appearance.  There was no hydronephrosis appreciated.  A semirigid ureteroscope was advanced to this level where the ureter itself was relatively full with an abrupt transition.  This appeared very similar to his previous ureteroscopy.  There is no overt papillary tumor.  I was able to advance the scope all the way up to the renal pelvis which was unremarkable and remainder of the collecting system.  I then brought the scope back down to the level of the defect and using a 200 m laser fiber and settings of 1 J and 15 Hz, reablated the area in question, at least a centimeter circumferentially.  Final retrograde pyelogram showed no contrast extravasation.  I then backloaded to the safety wire over rigid cystoscope and advanced a 6 x 26 French double-J Bard Optima ureteral stent up to the level of the kidney.  The wire was removed and a full coil was noted both within the bladder and renal pelvis.  The bladder was reinspected and no tumors were appreciated.  A 16  Fr foley was placed with 10 cc sterile water.    They patient was cleaned and dried.  He was repositioned in the supine position continues to the PACU in stable condition.  In the PACU, 2000 mg of intravesical gemcitabine was instilled to the bladder.  It  was allowed to dwell for 1 hour.  At the end of 1 hour the chemotherapy was drained and the Foley catheter was removed.  Plan: We will plan to begin intravesical BCG in about 6 weeks x 6 weeks.  We discussed risk and benefits today in detail.  All questions were answered.   [name], M.D.

## 2022-01-09 NOTE — Anesthesia Postprocedure Evaluation (Signed)
Anesthesia Post Note  Patient: GATES JIVIDEN  Procedure(s) Performed: CYSTOSCOPY/URETEROSCOPY/HOLMIUM LASER/STENT EXCHANGE (Right) BLADDER INSTILLATION OF GEMCITABINE CYSTOSCOPY WITH LASER FULGERATION OF URETERAL TUMOR (Right)  Patient location during evaluation: PACU Anesthesia Type: General Level of consciousness: awake and alert Pain management: pain level controlled Vital Signs Assessment: post-procedure vital signs reviewed and stable Respiratory status: spontaneous breathing, nonlabored ventilation, respiratory function stable and patient connected to nasal cannula oxygen Cardiovascular status: blood pressure returned to baseline and stable Postop Assessment: no apparent nausea or vomiting Anesthetic complications: no   No notable events documented.   Last Vitals:  Vitals:   01/09/22 1658 01/09/22 1700  BP:  (!) 153/68  Pulse: 84 83  Resp: (!) 31 (!) 21  Temp:    SpO2: 99% 98%    Last Pain:  Vitals:   01/09/22 1658  TempSrc:   PainSc: 4                  Ilene Qua

## 2022-01-09 NOTE — Anesthesia Preprocedure Evaluation (Signed)
Anesthesia Evaluation  Patient identified by MRN, date of birth, ID band Patient awake    Reviewed: Allergy & Precautions, NPO status , Patient's Chart, lab work & pertinent test results  History of Anesthesia Complications Negative for: history of anesthetic complications  Airway Mallampati: III  TM Distance: <3 FB Neck ROM: full    Dental  (+) Upper Dentures, Lower Dentures   Pulmonary shortness of breath and with exertion, COPD, former smoker   Pulmonary exam normal        Cardiovascular Exercise Tolerance: Good hypertension, + CAD  Normal cardiovascular exam+ dysrhythmias + pacemaker      Neuro/Psych  Neuromuscular disease  negative psych ROS   GI/Hepatic Neg liver ROS, hiatal hernia,GERD  Controlled,,  Endo/Other  negative endocrine ROSdiabetes, Type 2    Renal/GU Renal disease     Musculoskeletal   Abdominal   Peds  Hematology negative hematology ROS (+)   Anesthesia Other Findings Past Medical History: No date: Anemia No date: Arthritis of both hands No date: BPH (benign prostatic hypertrophy) No date: COPD (chronic obstructive pulmonary disease) (HCC) No date: Coronary artery disease     Comment:  a.) LHC 11/18/2004: 40% pLAD, 50% mLAD, 80% D1, 50%               mLCx, 100% mRCA - refer to [facility] for PCI; b.) LHC/PCI               11/21/2004: 100% mRCA (3.5 x 23 mm Cypher DES), 95% dRCA               (2.5 x 28 mm Cypher); b.) 05/03/12 Cardiac CT: patent RCA               stents, nonobs dzs;  c.) 01/2013 Ex MV: nl EF, no               ischemia. No date: Diastolic dysfunction     Comment:  a.) TTE 08/29/2013: EF 60-65%, mild LVH, mild BAE, mild               MR, mod TR, G1DD; b.) TTE 07/01/2020: EF 50-55%, LAEm               triv MR, mild-mod Ao sclerosis with no stenosis, G2DD No date: Dyspnea No date: GERD (gastroesophageal reflux disease) No date: Glaucoma of both eyes No date: H/O hiatal hernia 2006:  History of atrial fibrillation No date: History of diverticulitis No date: History of kidney stones No date: History of renal cell carcinoma     Comment:  a.) s/p LEFT nephroureterectomy No date: Hyperlipidemia No date: Hypertension No date: LBBB (left bundle branch block) No date: Mobitz type 2 second degree AV block     Comment:  a.) s/p PPM placement 08/29/2013 No date: Pneumonia No date: Presence of permanent cardiac pacemaker No date: Recurrent bladder transitional cell carcinoma (HCC)     Comment:  a.) s/p TURBT and intravesical chemotherapy No date: Renal insufficiency     Comment:  a. Creat rose to 2.34 post-op L nephrectomy. 08/29/2013: S/P placement of cardiac pacemaker     Comment:  a.) Medtroinc device placed for symptomatic bradycardia               and mobitz 2 second degree heart block No date: Symptomatic bradycardia     Comment:  a.) s/p PPM placement 08/29/2013 08/29/2017: Type 2 diabetes mellitus without complication, without  long-term current use of insulin ([place])  Past Surgical History:  02/03/1988: APPENDECTOMY 10/17/2021: BLADDER INSTILLATION; N/A     Comment:  Procedure: BLADDER INSTILLATION OF GEMCITABINE;                Surgeon: [name], MD;  Location: [facility];                Service: Urology;  Laterality: N/A; 01/02/2009: CATARACT EXTRACTION W/ INTRAOCULAR LENS  IMPLANT,  BILATERAL No date: COLONOSCOPY 11/21/2004: CORONARY ANGIOPLASTY WITH STENT PLACEMENT     Comment:  Procedure: CORONARY ANGIOPLASTY WITH STENT PLACEMENT               (mRCA and dRCA); Location: [facility]; Surgeon: Maryjean Morn, MD 01/13/2014: Consuela Mimes W/ RETROGRADES; Right     Comment:  Procedure: CYSTOSCOPY WITH RETROGRADE PYELOGRAM;                Surgeon: [name], MD;  Location: [facility];  Service: Urology;  Laterality: Right; 08/22/2013: CYSTOSCOPY W/ URETERAL STENT PLACEMENT; Left     Comment:  Procedure: CYSTOSCOPY WITH RETROGRADE  PYELOGRAM/URETERAL              STENT PLACEMENT;  Surgeon: [name], MD;  Location:              [facility];  Service: Urology;  Laterality: Left; 10/17/2021: CYSTOSCOPY W/ URETERAL STENT PLACEMENT; Right     Comment:  Procedure: CYSTOSCOPY WITH RETROGRADE PYELOGRAM/URETERAL              STENT PLACEMENT;  Surgeon: [name], MD;                Location: [facility];  Service: Urology;  Laterality:               Right; 04/13/2011: CYSTOSCOPY WITH BIOPSY     Comment:  Procedure: CYSTOSCOPY WITH BIOPSY;  Surgeon: [name], MD;  Location: [facility];  Service: Urology;                Laterality: N/A;  Cold Cup Biopsy 06/19/2013: CYSTOSCOPY WITH RETROGRADE PYELOGRAM, URETEROSCOPY AND  STENT PLACEMENT; Left     Comment:  Procedure: CYSTOSCOPY WITH LEFT RETROGRADE PYELOGRAM,               LEFT URETEROSCOPY, ureteral balloon dilatation, BIOPSY               LEFT KIDNEY;  Surgeon: [name], MD;                Location: [facility];  Service: Urology;               Laterality: Left; No date: EYE SURGERY 08/22/2013: INGUINAL HERNIA REPAIR; Left     Comment:  Procedure: LAPAROSCOPIC INGUINAL HERNIA;  Surgeon:               [name], MD;  Location: [facility];  Service: Urology;              Laterality: Left; No date: INSERT / REPLACE / REMOVE PACEMAKER 01/31/2021: IR KYPHO LUMBAR INC FX REDUCE BONE BX UNI/BIL CANNULATION  INC/IMAGING 10/24/2021: IR KYPHO LUMBAR INC FX REDUCE BONE BX UNI/BIL CANNULATION  INC/IMAGING 11/18/2004: LEFT HEART CATH AND CORONARY ANGIOGRAPHY; Left     Comment:  Procedure: LEFT HEART CATH AND CORONARY ANGIOGRAPHY;  Location: [place]; Surgeon: Neoma Laming, MD 08/29/2013: PERMANENT PACEMAKER INSERTION; N/A     Comment:  Procedure: PERMANENT PACEMAKER INSERTION;  Surgeon:               [name], MD;  Location: [facility] CATH LAB;  Service:               Cardiovascular;  Laterality: N/A;  Medtronic  dual-chamber 08/22/2013: ROBOT ASSITED LAPAROSCOPIC NEPHROURETERECTOMY; Left     Comment:  Procedure: ROBOT ASSITED LAPAROSCOPIC               NEPHROURETERECTOMY;  Surgeon: [name], MD;                Location: [facility];  Service: Urology;  Laterality: Left; 12/22/2010: TRANSURETHRAL RESECTION OF BLADDER TUMOR 08/04/2011: TRANSURETHRAL RESECTION OF BLADDER TUMOR     Comment:  Procedure: TRANSURETHRAL RESECTION OF BLADDER TUMOR               (TURBT);  Surgeon: [name], MD;  Location:               [facility];  Service: Urology;                Laterality: N/A; 01/13/2014: TRANSURETHRAL RESECTION OF BLADDER TUMOR; N/A     Comment:  Procedure: TRANSURETHRAL RESECTION OF BLADDER TUMOR               (TURBT);  Surgeon: [name], MD;  Location: [facility];  Service: Urology;  Laterality: N/A; 10/17/2021: TRANSURETHRAL RESECTION OF BLADDER TUMOR     Comment:  Procedure: TRANSURETHRAL RESECTION OF BLADDER TUMOR               (TURBT);  Surgeon: [name], MD;  Location: [facility]               ORS;  Service: Urology;; 10/17/2021: URETERAL BIOPSY     Comment:  Procedure: URETERAL BIOPSY;  Surgeon: [name],               MD;  Location: [facility];  Service: Urology;; 10/17/2021: URETEROSCOPY     Comment:  Procedure: URETEROSCOPY;  Surgeon: [name], MD;               Location: [facility];  Service: Urology;;  BMI    Body Mass Index: 24.53 kg/m      Reproductive/Obstetrics negative OB ROS                             Anesthesia Physical Anesthesia Plan  ASA: 3  Anesthesia Plan: General LMA   Post-op Pain Management:    Induction: Intravenous  PONV Risk Score and Plan: Dexamethasone, Ondansetron, Midazolam and Treatment may vary due to age or medical condition  Airway Management Planned: LMA  Additional Equipment:   Intra-op Plan:   Post-operative Plan: Extubation in  OR  Informed Consent: I have reviewed the patients History and Physical, chart, labs and discussed the procedure including the risks, benefits and alternatives for the proposed anesthesia with the patient or authorized representative who has indicated his/her understanding and acceptance.     Dental Advisory Given  Plan Discussed with: Anesthesiologist, CRNA and Surgeon  Anesthesia Plan Comments: (Patient consented for risks of anesthesia including but not limited to:  - adverse reactions to medications - damage to eyes,  teeth, lips or other oral mucosa - nerve damage due to positioning  - sore throat or hoarseness - Damage to heart, brain, nerves, lungs, other parts of body or loss of life  Patient voiced understanding.)       Anesthesia Quick Evaluation

## 2022-01-09 NOTE — Interval H&P Note (Signed)
History and Physical Interval Note:  01/09/2022 3:25 PM  Jacob [name]  has presented today for surgery, with the diagnosis of Bladder Cancer, Right Ureteral Tumor.  The various methods of treatment have been discussed with the patient and family. After consideration of risks, benefits and other options for treatment, the patient has consented to  Procedure(s): CYSTOSCOPY/URETEROSCOPY/HOLMIUM LASER/STENT EXCHANGE (Right) BLADDER INSTILLATION OF GEMCITABINE (N/A) CYSTOSCOPY WITH LASER FULGERATION OF URETERAL TUMOR (Right) as a surgical intervention.  The patient's history has been reviewed, patient examined, no change in status, stable for surgery.  I have reviewed the patient's chart and labs.  Questions were answered to the patient's satisfaction.    RRR CTAB  [name]

## 2022-01-10 ENCOUNTER — Encounter: Payer: Self-pay | Admitting: Urology

## 2022-01-16 ENCOUNTER — Other Ambulatory Visit: Payer: Self-pay | Admitting: Internal Medicine

## 2022-01-16 ENCOUNTER — Ambulatory Visit
Admission: RE | Admit: 2022-01-16 | Discharge: 2022-01-16 | Disposition: A | Discharge status: Home / Self Care | Payer: Medicare Other | Source: Ambulatory Visit | Attending: Internal Medicine | Admitting: Internal Medicine

## 2022-01-16 ENCOUNTER — Ambulatory Visit
Admission: RE | Admit: 2022-01-16 | Discharge: 2022-01-16 | Disposition: A | Discharge status: Home / Self Care | Payer: Medicare Other | Attending: Internal Medicine | Admitting: Internal Medicine

## 2022-01-16 DIAGNOSIS — S32030S Wedge compression fracture of third lumbar vertebra, sequela: Secondary | ICD-10-CM

## 2022-01-17 ENCOUNTER — Encounter (HOSPITAL_COMMUNITY): Payer: Self-pay

## 2022-01-17 ENCOUNTER — Emergency Department (HOSPITAL_COMMUNITY): Payer: Medicare Other

## 2022-01-17 ENCOUNTER — Inpatient Hospital Stay (HOSPITAL_COMMUNITY)
Admission: EM | Admit: 2022-01-17 | Discharge: 2022-01-23 | DRG: 871 | Disposition: A | Discharge status: Hospice | Payer: Medicare Other | Attending: Family Medicine | Admitting: Family Medicine

## 2022-01-17 ENCOUNTER — Other Ambulatory Visit: Payer: Self-pay

## 2022-01-17 DIAGNOSIS — M546 Pain in thoracic spine: Secondary | ICD-10-CM | POA: Diagnosis present

## 2022-01-17 DIAGNOSIS — N390 Urinary tract infection, site not specified: Secondary | ICD-10-CM | POA: Diagnosis present

## 2022-01-17 DIAGNOSIS — W19XXXA Unspecified fall, initial encounter: Secondary | ICD-10-CM | POA: Diagnosis present

## 2022-01-17 DIAGNOSIS — I5032 Chronic diastolic (congestive) heart failure: Secondary | ICD-10-CM | POA: Diagnosis present

## 2022-01-17 DIAGNOSIS — I472 Ventricular tachycardia, unspecified: Secondary | ICD-10-CM | POA: Diagnosis not present

## 2022-01-17 DIAGNOSIS — M19041 Primary osteoarthritis, right hand: Secondary | ICD-10-CM | POA: Diagnosis present

## 2022-01-17 DIAGNOSIS — S32020A Wedge compression fracture of second lumbar vertebra, initial encounter for closed fracture: Secondary | ICD-10-CM

## 2022-01-17 DIAGNOSIS — M19042 Primary osteoarthritis, left hand: Secondary | ICD-10-CM | POA: Diagnosis present

## 2022-01-17 DIAGNOSIS — C661 Malignant neoplasm of right ureter: Secondary | ICD-10-CM | POA: Diagnosis present

## 2022-01-17 DIAGNOSIS — Z87442 Personal history of urinary calculi: Secondary | ICD-10-CM

## 2022-01-17 DIAGNOSIS — D649 Anemia, unspecified: Secondary | ICD-10-CM

## 2022-01-17 DIAGNOSIS — I10 Essential (primary) hypertension: Secondary | ICD-10-CM | POA: Diagnosis present

## 2022-01-17 DIAGNOSIS — H409 Unspecified glaucoma: Secondary | ICD-10-CM | POA: Diagnosis present

## 2022-01-17 DIAGNOSIS — C642 Malignant neoplasm of left kidney, except renal pelvis: Secondary | ICD-10-CM | POA: Diagnosis present

## 2022-01-17 DIAGNOSIS — Z950 Presence of cardiac pacemaker: Secondary | ICD-10-CM | POA: Diagnosis present

## 2022-01-17 DIAGNOSIS — R509 Fever, unspecified: Secondary | ICD-10-CM | POA: Diagnosis not present

## 2022-01-17 DIAGNOSIS — D6959 Other secondary thrombocytopenia: Secondary | ICD-10-CM | POA: Diagnosis present

## 2022-01-17 DIAGNOSIS — R001 Bradycardia, unspecified: Secondary | ICD-10-CM | POA: Diagnosis present

## 2022-01-17 DIAGNOSIS — D631 Anemia in chronic kidney disease: Secondary | ICD-10-CM | POA: Diagnosis present

## 2022-01-17 DIAGNOSIS — A4152 Sepsis due to Pseudomonas: Principal | ICD-10-CM | POA: Diagnosis present

## 2022-01-17 DIAGNOSIS — N179 Acute kidney failure, unspecified: Secondary | ICD-10-CM | POA: Diagnosis not present

## 2022-01-17 DIAGNOSIS — W010XXA Fall on same level from slipping, tripping and stumbling without subsequent striking against object, initial encounter: Secondary | ICD-10-CM | POA: Diagnosis present

## 2022-01-17 DIAGNOSIS — Z880 Allergy status to penicillin: Secondary | ICD-10-CM

## 2022-01-17 DIAGNOSIS — K219 Gastro-esophageal reflux disease without esophagitis: Secondary | ICD-10-CM | POA: Diagnosis present

## 2022-01-17 DIAGNOSIS — I714 Abdominal aortic aneurysm, without rupture, unspecified: Secondary | ICD-10-CM | POA: Insufficient documentation

## 2022-01-17 DIAGNOSIS — N135 Crossing vessel and stricture of ureter without hydronephrosis: Secondary | ICD-10-CM | POA: Diagnosis present

## 2022-01-17 DIAGNOSIS — R7881 Bacteremia: Secondary | ICD-10-CM | POA: Diagnosis not present

## 2022-01-17 DIAGNOSIS — E119 Type 2 diabetes mellitus without complications: Secondary | ICD-10-CM

## 2022-01-17 DIAGNOSIS — Z905 Acquired absence of kidney: Secondary | ICD-10-CM

## 2022-01-17 DIAGNOSIS — S0101XA Laceration without foreign body of scalp, initial encounter: Secondary | ICD-10-CM | POA: Diagnosis present

## 2022-01-17 DIAGNOSIS — Z79899 Other long term (current) drug therapy: Secondary | ICD-10-CM

## 2022-01-17 DIAGNOSIS — N170 Acute kidney failure with tubular necrosis: Secondary | ICD-10-CM | POA: Diagnosis present

## 2022-01-17 DIAGNOSIS — Z7951 Long term (current) use of inhaled steroids: Secondary | ICD-10-CM

## 2022-01-17 DIAGNOSIS — E1122 Type 2 diabetes mellitus with diabetic chronic kidney disease: Secondary | ICD-10-CM | POA: Diagnosis present

## 2022-01-17 DIAGNOSIS — Z85528 Personal history of other malignant neoplasm of kidney: Secondary | ICD-10-CM

## 2022-01-17 DIAGNOSIS — S32120A Nondisplaced Zone II fracture of sacrum, initial encounter for closed fracture: Secondary | ICD-10-CM

## 2022-01-17 DIAGNOSIS — D696 Thrombocytopenia, unspecified: Secondary | ICD-10-CM | POA: Diagnosis present

## 2022-01-17 DIAGNOSIS — Y92012 Bathroom of single-family (private) house as the place of occurrence of the external cause: Secondary | ICD-10-CM

## 2022-01-17 DIAGNOSIS — Z6824 Body mass index (BMI) 24.0-24.9, adult: Secondary | ICD-10-CM

## 2022-01-17 DIAGNOSIS — D62 Acute posthemorrhagic anemia: Secondary | ICD-10-CM | POA: Diagnosis present

## 2022-01-17 DIAGNOSIS — R34 Anuria and oliguria: Secondary | ICD-10-CM | POA: Diagnosis present

## 2022-01-17 DIAGNOSIS — N400 Enlarged prostate without lower urinary tract symptoms: Secondary | ICD-10-CM | POA: Diagnosis present

## 2022-01-17 DIAGNOSIS — Z66 Do not resuscitate: Secondary | ICD-10-CM | POA: Diagnosis not present

## 2022-01-17 DIAGNOSIS — E43 Unspecified severe protein-calorie malnutrition: Secondary | ICD-10-CM | POA: Diagnosis present

## 2022-01-17 DIAGNOSIS — M8580 Other specified disorders of bone density and structure, unspecified site: Secondary | ICD-10-CM | POA: Diagnosis present

## 2022-01-17 DIAGNOSIS — Z7982 Long term (current) use of aspirin: Secondary | ICD-10-CM

## 2022-01-17 DIAGNOSIS — S0990XA Unspecified injury of head, initial encounter: Secondary | ICD-10-CM | POA: Diagnosis present

## 2022-01-17 DIAGNOSIS — E785 Hyperlipidemia, unspecified: Secondary | ICD-10-CM | POA: Diagnosis present

## 2022-01-17 DIAGNOSIS — Z87891 Personal history of nicotine dependence: Secondary | ICD-10-CM

## 2022-01-17 DIAGNOSIS — J449 Chronic obstructive pulmonary disease, unspecified: Secondary | ICD-10-CM | POA: Diagnosis present

## 2022-01-17 DIAGNOSIS — Z9049 Acquired absence of other specified parts of digestive tract: Secondary | ICD-10-CM

## 2022-01-17 DIAGNOSIS — I2510 Atherosclerotic heart disease of native coronary artery without angina pectoris: Secondary | ICD-10-CM | POA: Diagnosis present

## 2022-01-17 DIAGNOSIS — D630 Anemia in neoplastic disease: Secondary | ICD-10-CM | POA: Diagnosis present

## 2022-01-17 DIAGNOSIS — K5900 Constipation, unspecified: Secondary | ICD-10-CM | POA: Diagnosis present

## 2022-01-17 DIAGNOSIS — Z955 Presence of coronary angioplasty implant and graft: Secondary | ICD-10-CM

## 2022-01-17 DIAGNOSIS — I959 Hypotension, unspecified: Secondary | ICD-10-CM | POA: Diagnosis present

## 2022-01-17 DIAGNOSIS — I129 Hypertensive chronic kidney disease with stage 1 through stage 4 chronic kidney disease, or unspecified chronic kidney disease: Secondary | ICD-10-CM | POA: Diagnosis present

## 2022-01-17 DIAGNOSIS — S32000A Wedge compression fracture of unspecified lumbar vertebra, initial encounter for closed fracture: Secondary | ICD-10-CM | POA: Insufficient documentation

## 2022-01-17 DIAGNOSIS — R6520 Severe sepsis without septic shock: Secondary | ICD-10-CM

## 2022-01-17 DIAGNOSIS — C649 Malignant neoplasm of unspecified kidney, except renal pelvis: Secondary | ICD-10-CM | POA: Diagnosis present

## 2022-01-17 DIAGNOSIS — Z8249 Family history of ischemic heart disease and other diseases of the circulatory system: Secondary | ICD-10-CM

## 2022-01-17 DIAGNOSIS — I442 Atrioventricular block, complete: Secondary | ICD-10-CM | POA: Diagnosis present

## 2022-01-17 DIAGNOSIS — C679 Malignant neoplasm of bladder, unspecified: Secondary | ICD-10-CM | POA: Diagnosis present

## 2022-01-17 DIAGNOSIS — Z85828 Personal history of other malignant neoplasm of skin: Secondary | ICD-10-CM

## 2022-01-17 DIAGNOSIS — Z515 Encounter for palliative care: Secondary | ICD-10-CM

## 2022-01-17 DIAGNOSIS — I132 Hypertensive heart and chronic kidney disease with heart failure and with stage 5 chronic kidney disease, or end stage renal disease: Secondary | ICD-10-CM | POA: Diagnosis present

## 2022-01-17 DIAGNOSIS — N186 End stage renal disease: Secondary | ICD-10-CM | POA: Diagnosis present

## 2022-01-17 LAB — CBC
HCT: 23.9 % — ABNORMAL LOW (ref 39.0–52.0)
Hemoglobin: 7.7 g/dL — ABNORMAL LOW (ref 13.0–17.0)
MCH: 31.2 pg (ref 26.0–34.0)
MCHC: 32.2 g/dL (ref 30.0–36.0)
MCV: 96.8 fL (ref 80.0–100.0)
Platelets: 107 10*3/uL — ABNORMAL LOW (ref 150–400)
RBC: 2.47 MIL/uL — ABNORMAL LOW (ref 4.22–5.81)
RDW: 15.4 % (ref 11.5–15.5)
WBC: 9.5 10*3/uL (ref 4.0–10.5)
nRBC: 0 % (ref 0.0–0.2)

## 2022-01-17 LAB — CBG MONITORING, ED
Glucose-Capillary: 164 mg/dL — ABNORMAL HIGH (ref 70–99)
Glucose-Capillary: 142 mg/dL — ABNORMAL HIGH (ref 70–99)
Glucose-Capillary: 142 mg/dL — ABNORMAL HIGH (ref 70–99)

## 2022-01-17 LAB — URINALYSIS, ROUTINE W REFLEX MICROSCOPIC
Bilirubin Urine: NEGATIVE
Glucose, UA: NEGATIVE mg/dL
Ketones, ur: NEGATIVE mg/dL
Nitrite: NEGATIVE
Protein, ur: 100 mg/dL — AB
RBC / HPF: >50 RBC/hpf — ABNORMAL HIGH (ref 0–5)
Specific Gravity, Urine: 1.017 (ref 1.005–1.030)
WBC, UA: >50 WBC/hpf — ABNORMAL HIGH (ref 0–5)
pH: 5 (ref 5.0–8.0)

## 2022-01-17 LAB — BASIC METABOLIC PANEL
Anion gap: 13 (ref 5–15)
BUN: 51 mg/dL — ABNORMAL HIGH (ref 8–23)
CO2: 23 mmol/L (ref 22–32)
Calcium: 9 mg/dL (ref 8.9–10.3)
Chloride: 98 mmol/L (ref 98–111)
Creatinine, Ser: 2.49 mg/dL — ABNORMAL HIGH (ref 0.61–1.24)
GFR, Estimated: 24 mL/min — ABNORMAL LOW (ref >60)
Glucose, Bld: 176 mg/dL — ABNORMAL HIGH (ref 70–99)
Potassium: 4.4 mmol/L (ref 3.5–5.1)
Sodium: 134 mmol/L — ABNORMAL LOW (ref 135–145)

## 2022-01-17 LAB — ABO/RH: ABO/RH(D): O POS

## 2022-01-17 LAB — MAGNESIUM: Magnesium: 1.8 mg/dL (ref 1.7–2.4)

## 2022-01-17 LAB — TROPONIN I (HIGH SENSITIVITY)
Troponin I (High Sensitivity): 103 ng/L (ref <18)
Troponin I (High Sensitivity): 99 ng/L — ABNORMAL HIGH (ref <18)

## 2022-01-17 LAB — HEMOGLOBIN AND HEMATOCRIT, BLOOD
HCT: 24.4 % — ABNORMAL LOW (ref 39.0–52.0)
HCT: 25.9 % — ABNORMAL LOW (ref 39.0–52.0)
Hemoglobin: 8.1 g/dL — ABNORMAL LOW (ref 13.0–17.0)
Hemoglobin: 8.6 g/dL — ABNORMAL LOW (ref 13.0–17.0)

## 2022-01-17 LAB — LACTIC ACID, PLASMA
Lactic Acid, Venous: 1.1 mmol/L (ref 0.5–1.9)
Lactic Acid, Venous: 1.6 mmol/L (ref 0.5–1.9)

## 2022-01-17 LAB — PREPARE RBC (CROSSMATCH)

## 2022-01-17 MED ORDER — MORPHINE SULFATE (PF) 2 MG/ML IV SOLN
2.0000 mg | Freq: Once | INTRAVENOUS | Status: AC
  Administered 2022-01-17: 2 mg via INTRAVENOUS
  Filled 2022-01-17: qty 1

## 2022-01-17 MED ORDER — ATORVASTATIN CALCIUM 40 MG PO TABS
40.0000 mg | ORAL_TABLET | Freq: Every day | ORAL | Status: DC
  Administered 2022-01-18 – 2022-01-22 (×5): 40 mg via ORAL
  Filled 2022-01-17 (×5): qty 1

## 2022-01-17 MED ORDER — MAGNESIUM SULFATE 2 GM/50ML IV SOLN
2.0000 g | Freq: Once | INTRAVENOUS | Status: AC
  Administered 2022-01-17: 2 g via INTRAVENOUS
  Filled 2022-01-17: qty 50

## 2022-01-17 MED ORDER — MOMETASONE FURO-FORMOTEROL FUM 200-5 MCG/ACT IN AERO
2.0000 | INHALATION_SPRAY | Freq: Two times a day (BID) | RESPIRATORY_TRACT | Status: DC
  Administered 2022-01-18 – 2022-01-23 (×12): 2 via RESPIRATORY_TRACT
  Filled 2022-01-17: qty 8.8

## 2022-01-17 MED ORDER — BUDESON-GLYCOPYRROL-FORMOTEROL 160-9-4.8 MCG/ACT IN AERO: INHALATION_SPRAY | Freq: Two times a day (BID) | RESPIRATORY_TRACT | Status: DC

## 2022-01-17 MED ORDER — LACTATED RINGERS IV SOLN: INTRAVENOUS | Status: DC

## 2022-01-17 MED ORDER — UMECLIDINIUM BROMIDE 62.5 MCG/ACT IN AEPB
1.0000 | INHALATION_SPRAY | Freq: Every day | RESPIRATORY_TRACT | Status: DC
  Administered 2022-01-18 – 2022-01-23 (×6): 1 via RESPIRATORY_TRACT
  Filled 2022-01-17: qty 7

## 2022-01-17 MED ORDER — PANTOPRAZOLE SODIUM 40 MG PO TBEC
40.0000 mg | DELAYED_RELEASE_TABLET | Freq: Every day | ORAL | Status: DC
  Administered 2022-01-18 – 2022-01-23 (×6): 40 mg via ORAL
  Filled 2022-01-17 (×6): qty 1

## 2022-01-17 MED ORDER — AMIODARONE LOAD VIA INFUSION
150.0000 mg | Freq: Once | INTRAVENOUS | Status: AC
  Administered 2022-01-17: 150 mg via INTRAVENOUS
  Filled 2022-01-17: qty 83.34

## 2022-01-17 MED ORDER — LATANOPROST 0.005 % OP SOLN
1.0000 [drp] | Freq: Every day | OPHTHALMIC | Status: DC
  Administered 2022-01-18 – 2022-01-22 (×5): 1 [drp] via OPHTHALMIC
  Filled 2022-01-17 (×2): qty 2.5

## 2022-01-17 MED ORDER — SODIUM CHLORIDE 0.9 % IV BOLUS
1000.0000 mL | Freq: Once | INTRAVENOUS | Status: AC
  Administered 2022-01-17: 1000 mL via INTRAVENOUS

## 2022-01-17 MED ORDER — AMIODARONE HCL IN DEXTROSE 360-4.14 MG/200ML-% IV SOLN
60.0000 mg/h | INTRAVENOUS | Status: AC
  Administered 2022-01-17: 60 mg/h via INTRAVENOUS
  Filled 2022-01-17 (×2): qty 200

## 2022-01-17 MED ORDER — SENNOSIDES-DOCUSATE SODIUM 8.6-50 MG PO TABS: 1.0000 | ORAL_TABLET | Freq: Every evening | ORAL | Status: DC | PRN

## 2022-01-17 MED ORDER — ACETAMINOPHEN 325 MG PO TABS
650.0000 mg | ORAL_TABLET | Freq: Four times a day (QID) | ORAL | Status: DC | PRN
  Administered 2022-01-17 – 2022-01-18 (×3): 650 mg via ORAL
  Filled 2022-01-17 (×3): qty 2

## 2022-01-17 MED ORDER — HYDROMORPHONE HCL 1 MG/ML IJ SOLN
1.0000 mg | INTRAMUSCULAR | Status: DC | PRN
  Administered 2022-01-19: 1 mg via INTRAVENOUS
  Filled 2022-01-17 (×2): qty 1

## 2022-01-17 MED ORDER — FENTANYL CITRATE PF 50 MCG/ML IJ SOSY
50.0000 ug | PREFILLED_SYRINGE | Freq: Once | INTRAMUSCULAR | Status: AC
  Administered 2022-01-17: 50 ug via INTRAVENOUS
  Filled 2022-01-17: qty 1

## 2022-01-17 MED ORDER — ALBUTEROL SULFATE (2.5 MG/3ML) 0.083% IN NEBU
2.5000 mg | INHALATION_SOLUTION | RESPIRATORY_TRACT | Status: DC | PRN
  Administered 2022-01-18: 2.5 mg via RESPIRATORY_TRACT
  Filled 2022-01-17: qty 3

## 2022-01-17 MED ORDER — AMIODARONE HCL IN DEXTROSE 360-4.14 MG/200ML-% IV SOLN
30.0000 mg/h | INTRAVENOUS | Status: AC
  Administered 2022-01-17 – 2022-01-18 (×2): 30 mg/h via INTRAVENOUS
  Filled 2022-01-17 (×3): qty 200

## 2022-01-17 MED ORDER — LIDOCAINE-EPINEPHRINE (PF) 2 %-1:200000 IJ SOLN
10.0000 mL | Freq: Once | INTRAMUSCULAR | Status: AC
  Administered 2022-01-17: 10 mL via INTRADERMAL
  Filled 2022-01-17: qty 20

## 2022-01-17 MED ORDER — ACETAMINOPHEN 650 MG RE SUPP: 650.0000 mg | Freq: Four times a day (QID) | RECTAL | Status: DC | PRN

## 2022-01-17 MED ORDER — SODIUM CHLORIDE 0.9% IV SOLUTION: Freq: Once | INTRAVENOUS | Status: AC

## 2022-01-17 MED ORDER — MORPHINE SULFATE (PF) 4 MG/ML IV SOLN
4.0000 mg | Freq: Once | INTRAVENOUS | Status: AC
  Administered 2022-01-17: 4 mg via INTRAVENOUS
  Filled 2022-01-17: qty 1

## 2022-01-17 NOTE — Assessment & Plan Note (Addendum)
Palliative consulted, appreciate recs. -Pain regimen:  - Sch oxy 10 q4h  - prn Fentanyl 12.5 q2h - Cont Calcitonin nasal spray  - TLSO brace can be removed while in bed - Fall precautions

## 2022-01-17 NOTE — ED Notes (Signed)
Family medicine returned page and states they will come bedside to eval patient

## 2022-01-17 NOTE — Assessment & Plan Note (Addendum)
Complicating recovery, needs GOC discussion.  - strict I&Os - Follow-up with urology outpatient - continue GOC discussions

## 2022-01-17 NOTE — Assessment & Plan Note (Deleted)
Home statin ordered. Needs UTD lipid panel.

## 2022-01-17 NOTE — Progress Notes (Signed)
FMTS Interim Progress Note  Received page from the ED RN about dropping blood pressures to low 80s systolic and 40s diastolic.  Went down to the ED to assess patient he is alert and oriented x 4 and is able to answer all questions appropriately.  Does not seem overly sedated.  Reached out to on-call cardiologist Dr. Wyline Mood.  Reviewed his vital signs.  She recommended obtaining a lactic acid and keeping his maps between 55-60.  Pending lactic acid results and pressures patient may need to be placed on pressors.  Change level of care to progressive.  Will reach back out to cardiology for mental status changes, decrease BP MAP, or sustained V. tach.  [name], DO 01/17/2022, 5:42 PM PGY-1, [facility] Service pager [phone]

## 2022-01-17 NOTE — Progress Notes (Signed)
CALL PAGER [phone] for any questions or notifications regarding this patient   FMTS Attending Daily Note: Denny Levy MD  Attending pager:[phone]  office [phone]  I have reviewed recent vitals and labs with Drs. Hyacinth Meeker and Yetta Barre. Agree with plan. Hgb recheck is pending and we plan another about 6 hours later. I am concerned with his recent hx of GU bleeding. It seems this episode of anemia most likely related to acute bleed from scalp woould after fall, but will follow hgb fairly closely next 6-24 hours. ICU has been made aware of patients situation in case we need to transfer during night for pressor support of BP. Hypotension likely complicated by his treatment for RVR with amiodarone, but in setting of ABLA will follow hgb as well. Cardiology also awa re.

## 2022-01-17 NOTE — Assessment & Plan Note (Deleted)
Transfuse Hgb<8, cardiology following trop 103>awaiting following trop. S/p PTCA stent x2 2006.

## 2022-01-17 NOTE — Discharge Planning (Addendum)
Pt currently active with [facility] for Home Health services including RN/PT/OT/NA  as confirmed by [facility] with Eber Jones of [facility].  Pt will resume HH services with [facility] should that be is disposition at discharge.

## 2022-01-17 NOTE — ED Notes (Signed)
ED TO INPATIENT HANDOFF REPORT  ED Nurse Name and Phone #: Debara Pickett, RN  S Name/Age/Gender Jacob [name] 89 y.o. male Room/Bed: 037C/037C  Code Status   Code Status: Full Code  Home/SNF/Other Home Patient oriented to: self, place, time, and situation Is this baseline? Yes   Triage Complete: Triage complete  Chief Complaint Fall [W19.XXXA]  Triage Note Pt BIB by [facility] from home. Pt had an unwitnessed fall. No LOC reported, EMS reports a mechanical fall. Pt has a laceration on the left side of his forehead. Pt had a procedure 3 weeks ago where he had to remove some cancerous skin tissue from forehead, however it is now severely compromised. EMS witnessed a new depression on the top of the patient's head. Pt complains of back pain.   Pt is AO4  LVS 136/58  88% on RA but 96% on 4L  91.0 Temp Hx of cancer, COPD, CHF, and HTN. Pt is not on blood thinners but takes aspirin daily.    Allergies Allergies  Allergen Reactions   Penicillins Hives    BLISTERS    Level of Care/Admitting Diagnosis ED Disposition     ED Disposition  Admit   Condition  --   Comment  Hospital Area: [facility] [[id number]]  Level of Care: Telemetry Medical [104]  May place patient in observation at [facility] or [facility] if equivalent level of care is available:: Yes  Covid Evaluation: Asymptomatic - no recent exposure (last 10 days) testing not required  Diagnosis: Fall [[id number]]  Admitting Physician: Carney Living [phone]  Attending Physician: Carney Living [[id number]]          B Medical/Surgery History Past Medical History:  Diagnosis Date   Anemia    Arthritis of both hands    BPH (benign prostatic hypertrophy)    COPD (chronic obstructive pulmonary disease) (HCC)    Coronary artery disease    a.) LHC 11/18/2004: 40% pLAD, 50% mLAD, 80% D1, 50% mLCx, 100% mRCA - refer to [facility] for PCI; b.) LHC/PCI 11/21/2004: 100% mRCA (3.5 x 23 mm Cypher  DES), 95% dRCA (2.5 x 28 mm Cypher); b.) 05/03/12 Cardiac CT: patent RCA stents, nonobs dzs;  c.) 01/2013 Ex MV: nl EF, no ischemia.   Diastolic dysfunction    a.) TTE 08/29/2013: EF 60-65%, mild LVH, mild BAE, mild MR, mod TR, G1DD; b.) TTE 07/01/2020: EF 50-55%, LAEm triv MR, mild-mod Ao sclerosis with no stenosis, G2DD   Dyspnea    GERD (gastroesophageal reflux disease)    Glaucoma of both eyes    H/O hiatal hernia    History of atrial fibrillation 2006   History of diverticulitis    History of kidney stones    History of renal cell carcinoma    a.) s/p LEFT nephroureterectomy   Hyperlipidemia    Hypertension    LBBB (left bundle branch block)    Mobitz type 2 second degree AV block    a.) s/p PPM placement 08/29/2013   Pneumonia    Presence of permanent cardiac pacemaker    Recurrent bladder transitional cell carcinoma (HCC)    a.) s/p TURBT and intravesical chemotherapy   Renal insufficiency    a. Creat rose to 2.34 post-op L nephrectomy.   S/P placement of cardiac pacemaker 08/29/2013   a.) Medtroinc device placed for symptomatic bradycardia and mobitz 2 second degree heart block   Symptomatic bradycardia    a.) s/p PPM placement 08/29/2013   Type 2  diabetes mellitus without complication, without long-term current use of insulin (HCC) 08/29/2017   Past Surgical History:  Procedure Laterality Date   APPENDECTOMY  02/03/1988   BLADDER INSTILLATION N/A 10/17/2021   Procedure: BLADDER INSTILLATION OF GEMCITABINE;  Surgeon: Vanna Scotland, MD;  Location: [facility];  Service: Urology;  Laterality: N/A;   BLADDER INSTILLATION N/A 01/09/2022   Procedure: BLADDER INSTILLATION OF GEMCITABINE;  Surgeon: Vanna Scotland, MD;  Location: [facility];  Service: Urology;  Laterality: N/A;   CATARACT EXTRACTION W/ INTRAOCULAR LENS  IMPLANT, BILATERAL  01/02/2009   COLONOSCOPY     CORONARY ANGIOPLASTY WITH STENT PLACEMENT  11/21/2004   Procedure: CORONARY ANGIOPLASTY WITH STENT PLACEMENT (mRCA and  dRCA); Location: [facility]; Surgeon: Renne Musca, MD   CYSTOSCOPY W/ RETROGRADES Right 01/13/2014   Procedure: CYSTOSCOPY WITH RETROGRADE PYELOGRAM;  Surgeon: Sebastian Ache, MD;  Location: [facility];  Service: Urology;  Laterality: Right;   CYSTOSCOPY W/ URETERAL STENT PLACEMENT Left 08/22/2013   Procedure: CYSTOSCOPY WITH RETROGRADE PYELOGRAM/URETERAL STENT PLACEMENT;  Surgeon: Sebastian Ache, MD;  Location: [facility];  Service: Urology;  Laterality: Left;   CYSTOSCOPY W/ URETERAL STENT PLACEMENT Right 10/17/2021   Procedure: CYSTOSCOPY WITH RETROGRADE PYELOGRAM/URETERAL STENT PLACEMENT;  Surgeon: Vanna Scotland, MD;  Location: [facility];  Service: Urology;  Laterality: Right;   CYSTOSCOPY WITH BIOPSY  04/13/2011   Procedure: CYSTOSCOPY WITH BIOPSY;  Surgeon: Kathi Ludwig, MD;  Location: [facility];  Service: Urology;  Laterality: N/A;  Cold Cup Biopsy   CYSTOSCOPY WITH FULGERATION Right 01/09/2022   Procedure: CYSTOSCOPY WITH LASER FULGERATION OF URETERAL TUMOR;  Surgeon: Vanna Scotland, MD;  Location: [facility];  Service: Urology;  Laterality: Right;   CYSTOSCOPY WITH RETROGRADE PYELOGRAM, URETEROSCOPY AND STENT PLACEMENT Left 06/19/2013   Procedure: CYSTOSCOPY WITH LEFT RETROGRADE PYELOGRAM, LEFT URETEROSCOPY, ureteral balloon dilatation, BIOPSY LEFT KIDNEY;  Surgeon: Kathi Ludwig, MD;  Location: [facility];  Service: Urology;  Laterality: Left;   CYSTOSCOPY/URETEROSCOPY/HOLMIUM LASER/STENT PLACEMENT Right 01/09/2022   Procedure: CYSTOSCOPY/URETEROSCOPY/HOLMIUM LASER/STENT EXCHANGE;  Surgeon: Vanna Scotland, MD;  Location: [facility];  Service: Urology;  Laterality: Right;   EYE SURGERY     INGUINAL HERNIA REPAIR Left 08/22/2013   Procedure: LAPAROSCOPIC INGUINAL HERNIA;  Surgeon: Sebastian Ache, MD;  Location[facility];  Service: Urology;  Laterality: Left;   INSERT / REPLACE / REMOVE PACEMAKER     IR KYPHO LUMBAR INC FX REDUCE BONE BX UNI/BIL CANNULATION  INC/IMAGING  01/31/2021   IR KYPHO LUMBAR INC FX REDUCE BONE BX UNI/BIL CANNULATION INC/IMAGING  10/24/2021   LEFT HEART CATH AND CORONARY ANGIOGRAPHY Left 11/18/2004   Procedure: LEFT HEART CATH AND CORONARY ANGIOGRAPHY; Location: [facility]; Surgeon: Adrian Blackwater, MD   PERMANENT PACEMAKER INSERTION N/A 08/29/2013   Procedure: PERMANENT PACEMAKER INSERTION;  Surgeon: Marinus Maw, MD;  Location: [facility];  Service: Cardiovascular;  Laterality: N/A;  Medtronic dual-chamber   ROBOT ASSITED LAPAROSCOPIC NEPHROURETERECTOMY Left 08/22/2013   Procedure: ROBOT ASSITED LAPAROSCOPIC NEPHROURETERECTOMY;  Surgeon: Sebastian Ache, MD;  Location[facility];  Service: Urology;  Laterality: Left;   TRANSURETHRAL RESECTION OF BLADDER TUMOR  12/22/2010   TRANSURETHRAL RESECTION OF BLADDER TUMOR  08/04/2011   Procedure: TRANSURETHRAL RESECTION OF BLADDER TUMOR (TURBT);  Surgeon: Kathi Ludwig, MD;  Location: [facility];  Service: Urology;  Laterality: N/A;   TRANSURETHRAL RESECTION OF BLADDER TUMOR N/A 01/13/2014   Procedure: TRANSURETHRAL RESECTION OF BLADDER TUMOR (TURBT);  Surgeon: Sebastian Ache, MD;  Location: [facility];  Service: Urology;  Laterality: N/A;   TRANSURETHRAL RESECTION OF BLADDER TUMOR  10/17/2021   Procedure: TRANSURETHRAL RESECTION OF BLADDER TUMOR (TURBT);  Surgeon: Vanna Scotland, MD;  Location: [facility];  Service: Urology;;   URETERAL BIOPSY  10/17/2021   Procedure: URETERAL BIOPSY;  Surgeon: Vanna Scotland, MD;  Location: [facility];  Service: Urology;;   URETEROSCOPY  10/17/2021   Procedure: URETEROSCOPY;  Surgeon: Vanna Scotland, MD;  Location: [facility];  Service: Urology;;     A IV Location/Drains/Wounds Patient Lines/Drains/Airways Status     Active Line/Drains/Airways     Name Placement date Placement time Site Days   Peripheral IV 01/17/22 20 G Right Antecubital 01/17/22  0708  Antecubital  less than 1   Peripheral IV 01/17/22 20 G  Anterior;Left Forearm 01/17/22  1145  Forearm  less than 1   Ureteral Drain/Stent Right ureter 6 Fr. 01/09/22  1623  Right ureter  8   External Urinary Catheter 01/17/22  0900  --  less than 1            Intake/Output Last 24 hours  Intake/Output Summary (Last 24 hours) at 01/17/2022 2014 Last data filed at 01/17/2022 1553 Gross per 24 hour  Intake 1325 ml  Output 150 ml  Net 1175 ml    Labs/Imaging Results for orders placed or performed during the hospital encounter of 01/17/22 (from the past 48 hour(s))  Basic metabolic panel     Status: Abnormal   Collection Time: 01/17/22  7:08 AM  Result Value Ref Range   Sodium 134 (L) 135 - 145 mmol/L   Potassium 4.4 3.5 - 5.1 mmol/L   Chloride 98 98 - 111 mmol/L   CO2 23 22 - 32 mmol/L   Glucose, Bld 176 (H) 70 - 99 mg/dL    Comment: Glucose reference range applies only to samples taken after fasting for at least 8 hours.   BUN 51 (H) 8 - 23 mg/dL   Creatinine, Ser 4.51 (H) 0.61 - 1.24 mg/dL   Calcium 9.0 8.9 - 46.0 mg/dL   GFR, Estimated 24 (L) >60 mL/min    Comment: (NOTE) Calculated using the CKD-EPI Creatinine Equation (2021)    Anion gap 13 5 - 15    Comment: Performed at [facility], [address], [place], [place] [address]  CBC     Status: Abnormal   Collection Time: 01/17/22  7:08 AM  Result Value Ref Range   WBC 9.5 4.0 - 10.5 K/uL   RBC 2.47 (L) 4.22 - 5.81 MIL/uL   Hemoglobin 7.7 (L) 13.0 - 17.0 g/dL   HCT 72.1 (L) 58.7 - 27.6 %   MCV 96.8 80.0 - 100.0 fL   MCH 31.2 26.0 - 34.0 pg   MCHC 32.2 30.0 - 36.0 g/dL   RDW 18.4 85.9 - 27.6 %   Platelets 107 (L) 150 - 400 K/uL   nRBC 0.0 0.0 - 0.2 %    Comment: Performed at [facility], [address] [place], [place] [address]  ABO/Rh     Status: None   Collection Time: 01/17/22  7:08 AM  Result Value Ref Range   ABO/RH(D)      O POS Performed at [facility], [address] [place], [place] [address]   CBG monitoring, ED     Status:  Abnormal   Collection Time: 01/17/22  7:21 AM  Result Value Ref Range   Glucose-Capillary 164 (H) 70 - 99 mg/dL  Comment: Glucose reference range applies only to samples taken after fasting for at least 8 hours.  Troponin I (High Sensitivity)     Status: Abnormal   Collection Time: 01/17/22  9:42 AM  Result Value Ref Range   Troponin I (High Sensitivity) 103 (HH) <18 ng/L    Comment: CRITICAL RESULT CALLED TO, READ BACK BY AND VERIFIED WITH [email or web address] [email or web address]  16.01.2024 E.Ahmed (NOTE) Elevated high sensitivity troponin I (hsTnI) values and significant  changes across serial measurements may suggest ACS but many other  chronic and acute conditions are known to elevate hsTnI results.  Refer to the "Links" section for chest pain algorithms and additional  guidance. Performed at [facility], [address], [place], [place] [address]   Magnesium     Status: None   Collection Time: 01/17/22  9:42 AM  Result Value Ref Range   Magnesium 1.8 1.7 - 2.4 mg/dL    Comment: Performed at [facility], [address] [place], [place] [address]  Prepare RBC (crossmatch)     Status: None   Collection Time: 01/17/22 10:15 AM  Result Value Ref Range   Order Confirmation      ORDER PROCESSED BY BLOOD BANK Performed at [facility], [address], [place], [place] [address]   Type and screen [facility]     Status: None (Preliminary result)   Collection Time: 01/17/22 10:15 AM  Result Value Ref Range   ABO/RH(D) O POS    Antibody Screen NEG    Sample Expiration 01/20/2022,2359    Unit Number [id number]    Blood Component Type RED CELLS,LR    Unit division 00    Status of Unit ISSUED    Transfusion Status OK TO TRANSFUSE    Crossmatch Result      Compatible Performed at [facility], [address], [place], [place] [address]   Troponin I (High Sensitivity)     Status: Abnormal   Collection Time: 01/17/22 11:41 AM  Result Value Ref Range    Troponin I (High Sensitivity) 99 (H) <18 ng/L    Comment: (NOTE) Elevated high sensitivity troponin I (hsTnI) values and significant  changes across serial measurements may suggest ACS but many other  chronic and acute conditions are known to elevate hsTnI results.  Refer to the "Links" section for chest pain algorithms and additional  guidance. Performed at [facility], [address], [place], [place] [address]   CBG monitoring, ED     Status: Abnormal   Collection Time: 01/17/22 12:52 PM  Result Value Ref Range   Glucose-Capillary 142 (H) 70 - 99 mg/dL    Comment: Glucose reference range applies only to samples taken after fasting for at least 8 hours.  Urinalysis, Routine w reflex microscopic     Status: Abnormal   Collection Time: 01/17/22  3:53 PM  Result Value Ref Range   Color, Urine AMBER (A) YELLOW    Comment: BIOCHEMICALS MAY BE AFFECTED BY COLOR   APPearance CLOUDY (A) CLEAR   Specific Gravity, Urine 1.017 1.005 - 1.030   pH 5.0 5.0 - 8.0   Glucose, UA NEGATIVE NEGATIVE mg/dL   Hgb urine dipstick LARGE (A) NEGATIVE   Bilirubin Urine NEGATIVE NEGATIVE   Ketones, ur NEGATIVE NEGATIVE mg/dL   Protein, ur 376 (A) NEGATIVE mg/dL   Nitrite NEGATIVE NEGATIVE   Leukocytes,Ua SMALL (A) NEGATIVE   RBC / HPF >50 (H) 0 - 5 RBC/hpf  WBC, UA >50 (H) 0 - 5 WBC/hpf   Bacteria, UA FEW (A) NONE SEEN   Squamous Epithelial / HPF 0-5 0 - 5 /HPF    Comment: Performed at [facility], [address], [place], [place] [address]  CBG monitoring, ED     Status: Abnormal   Collection Time: 01/17/22  4:54 PM  Result Value Ref Range   Glucose-Capillary 142 (H) 70 - 99 mg/dL    Comment: Glucose reference range applies only to samples taken after fasting for at least 8 hours.  Hemoglobin and hematocrit, blood     Status: Abnormal   Collection Time: 01/17/22  6:30 PM  Result Value Ref Range   Hemoglobin 8.1 (L) 13.0 - 17.0 g/dL   HCT 08.9 (L) 10.0 - 26.2 %    Comment: Performed  at [facility], [address], [place], [place] [address]   CT HEAD WO CONTRAST  Result Date: 01/17/2022 CLINICAL DATA:  Polytrauma, blunt. EXAM: CT HEAD WITHOUT CONTRAST CT CERVICAL SPINE WITHOUT CONTRAST TECHNIQUE: Multidetector CT imaging of the head and cervical spine was performed following the standard protocol without intravenous contrast. Multiplanar CT image reconstructions of the cervical spine were also generated. RADIATION DOSE REDUCTION: This exam was performed according to the departmental dose-optimization program which includes automated exposure control, adjustment of the mA and/or kV according to patient size and/or use of iterative reconstruction technique. COMPARISON:  None Available. FINDINGS: CT HEAD FINDINGS Brain: Generalized age related parenchymal volume loss with commensurate dilatation of the ventricles and sulci. Mild chronic small vessel ischemic changes within the bilateral periventricular and subcortical white matter regions. No mass, hemorrhage, edema or other evidence of acute parenchymal abnormality. No extra-axial hemorrhage. Vascular: Chronic calcified atherosclerotic changes of the large vessels at the skull base. No unexpected hyperdense vessel. Skull: Normal. Negative for fracture or focal lesion. Sinuses/Orbits: No acute finding. Other: Scalp defect overlying the LEFT frontal bone, with probable exposure of the frontal bone. No underlying fracture is seen. CT CERVICAL SPINE FINDINGS Alignment: Levoscoliosis. No evidence of acute vertebral body subluxation. Skull base and vertebrae: No fracture line or displaced fracture fragment is seen. Facet joints appear normally aligned. Soft tissues and spinal canal: No prevertebral fluid or swelling. No visible canal hematoma. Disc levels: Degenerative spondylosis throughout the cervical spine, at least moderate in degree causing mild to moderate central canal stenoses at the C4-5 through C7-T1 levels. Additional advanced  degenerative hypertrophic changes of the facet and uncovertebral joints resulting in moderate to severe neural foramen stenoses at C3-4, C4-5, C5-6 and C6-7 with possible associated nerve root impingements. Upper chest: No acute findings. Emphysematous changes at the lung apices with associated pleural-parenchymal scarring/fibrosis. Other: Bilateral carotid atherosclerosis. IMPRESSION: 1. Scalp defect overlying the LEFT frontal bone, with probable exposure of the frontal bone. No underlying skull fracture is seen. 2. No acute intracranial abnormality. No intracranial mass, hemorrhage or edema. 3. No fracture or acute subluxation within the cervical spine. 4. Degenerative changes of the cervical spine, as detailed above. 5. Carotid atherosclerosis. Electronically Signed   By: Bary Richard M.D.   On: 01/17/2022 09:02   CT Cervical Spine Wo Contrast  Result Date: 01/17/2022 CLINICAL DATA:  Polytrauma, blunt. EXAM: CT HEAD WITHOUT CONTRAST CT CERVICAL SPINE WITHOUT CONTRAST TECHNIQUE: Multidetector CT imaging of the head and cervical spine was performed following the standard protocol without intravenous contrast. Multiplanar CT image reconstructions of the cervical spine were also generated. RADIATION DOSE REDUCTION: This exam was performed  according to the departmental dose-optimization program which includes automated exposure control, adjustment of the mA and/or kV according to patient size and/or use of iterative reconstruction technique. COMPARISON:  None Available. FINDINGS: CT HEAD FINDINGS Brain: Generalized age related parenchymal volume loss with commensurate dilatation of the ventricles and sulci. Mild chronic small vessel ischemic changes within the bilateral periventricular and subcortical white matter regions. No mass, hemorrhage, edema or other evidence of acute parenchymal abnormality. No extra-axial hemorrhage. Vascular: Chronic calcified atherosclerotic changes of the large vessels at the skull  base. No unexpected hyperdense vessel. Skull: Normal. Negative for fracture or focal lesion. Sinuses/Orbits: No acute finding. Other: Scalp defect overlying the LEFT frontal bone, with probable exposure of the frontal bone. No underlying fracture is seen. CT CERVICAL SPINE FINDINGS Alignment: Levoscoliosis. No evidence of acute vertebral body subluxation. Skull base and vertebrae: No fracture line or displaced fracture fragment is seen. Facet joints appear normally aligned. Soft tissues and spinal canal: No prevertebral fluid or swelling. No visible canal hematoma. Disc levels: Degenerative spondylosis throughout the cervical spine, at least moderate in degree causing mild to moderate central canal stenoses at the C4-5 through C7-T1 levels. Additional advanced degenerative hypertrophic changes of the facet and uncovertebral joints resulting in moderate to severe neural foramen stenoses at C3-4, C4-5, C5-6 and C6-7 with possible associated nerve root impingements. Upper chest: No acute findings. Emphysematous changes at the lung apices with associated pleural-parenchymal scarring/fibrosis. Other: Bilateral carotid atherosclerosis. IMPRESSION: 1. Scalp defect overlying the LEFT frontal bone, with probable exposure of the frontal bone. No underlying skull fracture is seen. 2. No acute intracranial abnormality. No intracranial mass, hemorrhage or edema. 3. No fracture or acute subluxation within the cervical spine. 4. Degenerative changes of the cervical spine, as detailed above. 5. Carotid atherosclerosis. Electronically Signed   By: Bary Richard M.D.   On: 01/17/2022 09:02   DG Pelvis 1-2 Views  Result Date: 01/17/2022 CLINICAL DATA:  Fall EXAM: PELVIS - 1-2 VIEW COMPARISON:  None Available. FINDINGS: There is a possible nondisplaced left-sided sacral fracture in zone 2. There is moderate bilateral osteoarthritis. No other evidence of fracture in the pelvis on single frontal view. Partially visualized lower  lumbar spine with prior vertebroplasty for compression fracture of L3. IMPRESSION: Possible nondisplaced left-sided sacral fracture in zone 2. Electronically Signed   By: Caprice Renshaw M.D.   On: 01/17/2022 08:20   DG Lumbar Spine 2-3 Views  Result Date: 01/17/2022 CLINICAL DATA:  Fall EXAM: LUMBAR SPINE - 2-3 VIEW COMPARISON:  Lumbar spine radiograph 01/16/2022, CT 10/19/2021 FINDINGS: There are chronic compression fractures of L1 and L3 with prior vertebroplasty. There is a compression deformity of L2, new since October 2023, with up to 25% height loss.There is multilevel degenerative disc disease, moderate and severe at L4-L5. There is moderate lower lumbar predominant facet arthropathy. Unchanged slight bony retropulsion of the superior endplate of L1. Right-sided nephroureteral stent noted. IMPRESSION: Chronic compression fractures of L1 and L3 with prior vertebroplasty and no evidence of progressive height loss. Compression fracture of L2 with up to 25% height loss, new since October 2023. Electronically Signed   By: Caprice Renshaw M.D.   On: 01/17/2022 08:17   DG Chest 1 View  Result Date: 01/17/2022 CLINICAL DATA:  Fall EXAM: CHEST  1 VIEW COMPARISON:  10/28/2021 FINDINGS: LEFT-sided pacer overlies stable cardiac silhouette. Scarring at the LEFT lung base. No effusion, infiltrate, pneumothorax. No fracture identified. IMPRESSION: No evidence of thoracic trauma. Electronically Signed   By: Roseanne Reno  Leonia Reeves M.D.   On: 01/17/2022 08:12   DG Lumbar Spine Complete  Result Date: 01/16/2022 CLINICAL DATA:  Back pain. EXAM: LUMBAR SPINE - COMPLETE 5 VIEW COMPARISON:  None Available. FINDINGS: Compression deformities with vertebroplasties at L1 and L3. No spondylolisthesis. Osseous structures are osteopenic. Dense atheromatous calcification of the aorta is incidentally noted. There is a right ureteral stent. IMPRESSION: Osteopenia. L3 and L1 compression deformities status post vertebroplasty. Electronically  Signed   By: Sammie Bench M.D.   On: 01/16/2022 13:05    Pending Labs Unresulted Labs (From admission, onward)     Start     Ordered   01/18/22 1478  Basic metabolic panel  Daily,   R      01/17/22 1128   01/18/22 0500  CBC  Daily,   R      01/17/22 1128   01/18/22 0500  Hemoglobin A1c  Tomorrow morning,   R        01/17/22 1154   01/18/22 0500  Magnesium  Tomorrow morning,   R        01/17/22 1915   01/18/22 0001  Hemoglobin and hematocrit, blood  Now then every 6 hours,   R      01/17/22 1834   01/17/22 1739  Lactic acid, plasma  STAT Now then every 3 hours,   R      01/17/22 1739   01/17/22 1159  Urine Culture  (Urine Culture)  Once,   R       Question:  Indication  Answer:  Dysuria   01/17/22 1159            Vitals/Pain Today's Vitals   01/17/22 1830 01/17/22 1955 01/17/22 1955 01/17/22 2000  BP: (!) 88/52   (!) 110/59  Pulse: 78   80  Resp: (!) 21   (!) 21  Temp:  98.5 F (36.9 C)    TempSrc:  Oral    SpO2: 93%   92%  Weight:      Height:      PainSc:   0-No pain     Isolation Precautions No active isolations  Medications Medications  amiodarone (NEXTERONE) 1.8 mg/mL load via infusion 150 mg (150 mg Intravenous Bolus from Bag 01/17/22 1058)    Followed by  amiodarone (NEXTERONE PREMIX) 360-4.14 MG/200ML-% (1.8 mg/mL) IV infusion (0 mg/hr Intravenous Stopped 01/17/22 1712)    Followed by  amiodarone (NEXTERONE PREMIX) 360-4.14 MG/200ML-% (1.8 mg/mL) IV infusion (30 mg/hr Intravenous New Bag/Given 01/17/22 1712)  atorvastatin (LIPITOR) tablet 40 mg (has no administration in time range)  pantoprazole (PROTONIX) EC tablet 40 mg (has no administration in time range)  senna-docusate (Senokot-S) tablet 1 tablet (has no administration in time range)  latanoprost (XALATAN) 0.005 % ophthalmic solution 1 drop (has no administration in time range)  albuterol (PROVENTIL) (2.5 MG/3ML) 0.083% nebulizer solution 2.5 mg (has no administration in time range)   Budeson-Glycopyrrol-Formoterol 160-9-4.8 MCG/ACT AERO (has no administration in time range)  acetaminophen (TYLENOL) tablet 650 mg (650 mg Oral Given 01/17/22 1502)    Or  acetaminophen (TYLENOL) suppository 650 mg ( Rectal See Alternative 01/17/22 1502)  lactated ringers infusion ( Intravenous New Bag/Given 01/17/22 1952)  magnesium sulfate IVPB 2 g 50 mL (2 g Intravenous New Bag/Given 01/17/22 1953)  lidocaine-EPINEPHrine (XYLOCAINE W/EPI) 2 %-1:200000 (PF) injection 10 mL (10 mLs Intradermal Given 01/17/22 1008)  sodium chloride 0.9 % bolus 1,000 mL (0 mLs Intravenous Stopped 01/17/22 1005)  sodium chloride 0.9 % bolus 1,000 mL (  0 mLs Intravenous Stopped 01/17/22 1712)  fentaNYL (SUBLIMAZE) injection 50 mcg (50 mcg Intravenous Given 01/17/22 0859)  fentaNYL (SUBLIMAZE) injection 50 mcg (50 mcg Intravenous Given 01/17/22 1017)  0.9 %  sodium chloride infusion (Manually program via Guardrails IV Fluids) (0 mLs Intravenous Stopped 01/17/22 1500)  morphine (PF) 4 MG/ML injection 4 mg (4 mg Intravenous Given 01/17/22 1547)  morphine (PF) 2 MG/ML injection 2 mg (2 mg Intravenous Given 01/17/22 1808)    Mobility walks with device     Focused Assessments Cardiac Assessment Handoff:    Lab Results  Component Value Date   TROPONINI 0.05 (H) 03/27/2015   No results found for: "DDIMER" Does the Patient currently have chest pain? No   , Neuro Assessment Handoff:  Swallow screen pass? Yes          Neuro Assessment: Exceptions to WDL Neuro Checks:      Has TPA been given? No If patient is a Neuro Trauma and patient is going to OR before floor call report to 4N Charge nurse: [phone] or [phone]  , Pulmonary Assessment Handoff:  Lung sounds: Bilateral Breath Sounds: Diminished, Coarse crackles O2 Device: Room Air O2 Flow Rate (L/min): 3 L/min    R Recommendations: See Admitting Provider Note  Report given to:   Additional Notes: patient currently in back brace d/t L2 fx. MD  Hyacinth Meeker states last note that aware and okay with MAP 55-60 d/t low BP.

## 2022-01-17 NOTE — ED Notes (Signed)
Per Dr Hyacinth Meeker, okay for patient's MAPs to be 55-60

## 2022-01-17 NOTE — ED Notes (Signed)
Ortho at bedside.

## 2022-01-17 NOTE — Consult Note (Signed)
ELECTROPHYSIOLOGY CONSULT NOTE    Patient ID: Jacob [name] MRN: [id number], DOB/AGE: 87-05-1928 87 y.o.  Admit date: 01/17/2022 Date of Consult: 01/17/2022  Primary Physician: Sherrie Mustache, MD Primary Cardiologist: Sherryl Manges, MD  Electrophysiologist: Dr. Graciela Husbands  Referring Provider: Fayrene Helper, PA  Patient Profile: Jacob [name] is a 89 y.o. male with a history of Bladder tumor, COPD, Renal cell carcinoma s/p Left nephrectomy, HLD, Advanced AV block s/p DDD PPM 2015, NSVT, PVCs, and skin cancer of the head with recent excision who is being seen today for the evaluation of VT on device interrogation at the request of Fayrene Helper PA-C.  HPI:  Jacob [name] is a 89 y.o. male with medical history as above.   Last seen by Dr. Graciela Husbands 09/2021. Was doing OK at tha ttime but having some exercise intolerance.  Had previously had Myoview with no ischemia but ? EF down. Confirmatory echo showed normal EF 06/2021.  Pt states he was getting up from the toilet this morning when he fell and hit his head. He called EMS and was brought to ED for further evaluation.   Denies syncope.  He reports he had some cancerous tissue removed from his forehead several weeks ago, now with significant trauma to the area. Pt complains of back pain.   CT head - Scalp defect with possible exposure of the frontal bone, no other acute intracranial abnormality CT C spine -  Degenerative c spine changes, no acute changes CXR with no thoracic trauma DG pelvis with possible non-displaced L sided sacral fracture in zone 2 DG Lumbar with chronic compression fractures of L1 and L3 with prior vertebroplasty  Pt only currently complaint is soreness from fall and head trauma. He denies syncope leading up to the event. Denies chest pain, HA, or dizziness prior to the fall.   Labs Potassium4.4 (01/16 0708)   Creatinine, ser  2.49* (01/16 0708) PLT  107* (01/16 0708) HGB  7.7* (01/16 0708) WBC 9.5 (01/16  0708)  Inpatient Medications:   lidocaine-EPINEPHrine  10 mL Intradermal Once   Allergies, Social History, Past Medical History and Family History were reviewed   Physical Exam: Vitals:   01/17/22 0830 01/17/22 0840 01/17/22 0900 01/17/22 0915  BP: (!) 101/56  (!) 109/56 (!) 120/47  Pulse: 87  90 90  Resp: (!) 24  (!) 22 18  Temp:  98.8 F (37.1 C)    TempSrc:  Rectal    SpO2: 99%  99% 99%    GEN- The patient is well appearing, alert and oriented x 3 today.   HEENT: Large forehead laceration, ED PA currently suturing.  Lungs- CTAB  Heart- Regular rate and rhythm, no murmurs, rubs or gallops Extremities- no clubbing, cyanosis, or edema;    EKG:on arrival shows NSR at 93 bpm (personally reviewed)  TELEMETRY: NSR 80-90s, with several runs of NSVT. None since around 0730, consistent with device interrogation.  (personally reviewed)  DEVICE HISTORY: Dual chamber MDT PPM implanted 08/29/2013 for CHB  Assessment/Plan: 1.  Ventricular tachycardia, sustained 06/2021 Myoview around that time without ischemia with falsely down EF, normal on Echo.  Has previously had NSVT and PVCs By timestamps, VT would have occurred after fall. Pt also denies LOC and reports mechanical fall With 1 of the episodes exceeding 30 seconds, will start amiodarone IV for at least 24 hours, then transition to po if remains quiescent.  Given pts age and co-morbidities, he is not a candidate for ICD upgrade.  Potassium4.4 (01/16  7809) Mg pending Keep K > 4.0 and Mg > 2.0   2. CAD w prior stenting Non-ischemic myoview 06/2021 HS trop pending No CP.   3. CHB s/p [employer] DDD PPM PPM with normal function He carries a PMH of atrial fibrillation, but this has not been demonstrated on his PPM. Not currently on OAC.  4. Head trauma 5. Skin cancer of the forehead It is unclear what his forehead wound looked like prior to the fall, but suspect was interrupted from trauma.  Per primary team.   6. Anemia 7.  Hematuria 8. H/o Bladder CA with recent ureteral stent replacement 9. Solitary kidney s/p left nephrectomy Per primary team Hgb 7.7 today. Transfusing 1 unit per ED provider.    For questions or updates, please contact [facility] Please consult [email or web address] for contact info under Cardiology/STEMI.  Dustin Flock, PA-C  01/17/2022 9:47 AM

## 2022-01-17 NOTE — Progress Notes (Signed)
FMTS Brief Progress Note  S:Went bedside w/ doctors Larae Grooms and Joeseph Amor to see patient. Patient resting comfortably and easily awakened. Report's pain is well controlled at 4/10.    O: BP (!) 110/59   Pulse 80   Temp 98.5 F (36.9 C) (Oral)   Resp (!) 21   Ht 5\' 6"  (1.676 m)   Wt 68.9 kg   SpO2 92%   BMI 24.53 kg/m   General: Alert and interactive, NAD  A/P: Fall Patient here s/p mechanical fall w/ old lumbar compression fracture, and possible sacral fracture. Pain well controlled. Given soft BP's earlier will treat pain with dilaudid, and hold off from morphine.  -Dilaudid 1 mg q4h prn  Head Injury Patient w/ scalp lac after fall, following scalp/skull procedure prior to fall. Wound dressed -Stable  - Orders reviewed. Labs for AM ordered, which was adjusted as needed.   Holley Bouche, MD 01/17/2022, 8:15 PM PGY-2, [facility] Night Resident  Please page [phone] with questions.

## 2022-01-17 NOTE — Assessment & Plan Note (Deleted)
Hx MOHs w/o full skin closure as local skin laxity was not sufficient for layered closure. CT head: exposure of the frontal bone, no fracture. - F/U dermatology outpatient  - continue with wound care and keeping clean and dry

## 2022-01-17 NOTE — Assessment & Plan Note (Addendum)
Poor candidate for dialysis, pending GOC discussion - consult nephrology, appreciate help with care - cont bladder scans Q6H  - Strict I/Os  - daily BMP

## 2022-01-17 NOTE — H&P (Addendum)
Hospital Admission History and Physical Service Pager: [phone]  Patient name: Jacob [name] Medical record number: [id number] Date of Birth: 08-27-1928 Age: 89 y.o. Gender: male  Primary Care Provider: Sherrie Mustache, MD Consultants: NSGY, Cardiology, ENT  Code Status: Full, discussed with patient Preferred Emergency Contact:  Contact Information     Name Relation Home Work [name] [phone]  [phone]   Jacob [name] [phone]        Chief Complaint: Mechanical Fall   Assessment and Plan: Jacob [name] is a 89 y.o. male with PMHx renal cell carcinoma with recent reoccurrence, COPD with no O2 at baseline, hyperlipidemia, advanced AV block s/p DDD PPM 2015, NSVT, PVCs, excised infiltrative basal cell carcinoma on the face presenting with fall without loss of consciousness but did hit head during the process. Differential for this patient's presentation of this includes instability, less likely related to vasovagal event as patient reports he did not experience any lightheadedness or dizziness during the episode.  Patient does not appear to have any other causes of altered consciousness, has no infectious signs or symptoms but does have anemia which could have been symptomatic and caused the fall.    * Fall Fall in the setting of recent procedure with what appears to be new onset anemia on CBC.  Patient reports he did not have loss of consciousness, dizziness, lightheadedness during the event and reports that he was simply unstable, causing the fall.  Patient ambulates with walker at baseline with a daughter in the home helping him with ADLs.  Monitoring for secondary causes of the fall, but patient is grossly weak on physical exam. Hit head during fall, CT scan without acute changes/neurologically intact.  - Admit to FMTS, Dr. Modena Morrow as attending - AM CBC and BMP - EDP ordered 1 unit RBCs (Hgb 7.7) - Fluids as needed for  dehydration - Fall precautions - PT/OT evaluate and treat  Lumbar compression fracture [facility]) History of kyphoplasty x2, last procedure November 2023 with persistent back pain. Xray lumbar spine compression fracture of L2 with up to 25% height loss, new since October 2023 Sacral XR possible nondisplaced left-sided sacral fracture in zone 2. NSGY consulted in ED and recommended TLSO brace and follow up with NSGY outpatient.  -TLSO brace in place  -NSGY follow up  -Ortho recs? For sacral fx   V tach (HCC) Patient was found to have sustained V. tach on device interrogation from Medtronic pacemaker while in the emergency room.  Cardiology was consulted and recommended to continue with amiodarone IV for 24 hours and then transition to p.o. it does not appear that sustained V. tach was related to his fall via time stamps. - Amiodarone IV to PO  - Cardiology following, appreciate recommendations   Head injury due to trauma History of frontal scalp procedure, MOHs without full skin closure as local skin laxity was not sufficient for layered closure. Patient had exposure of frontal skull prior to fall, but he had lacerations extending from the initial procedural site that were irrigated and closed as able by EDP. EDP Laveda Norman PA spoke to ENT to recommended to continue with dermatology follow up. CT head: Scalp defect overlying the LEFT frontal bone, with probable exposure of the frontal bone. No underlying fracture is seen. - May need dermatology assistance inpatient vs outpatient  - continue with wound care and keeping clean and dry    AKI (acute kidney injury) (HCC) See history in RCC problem. AKI on  admission 1.94>2.49. Continue monitoring, and hold nephrotoxic medications.  - Encourage hydration - Strict I/Os  - AM BMP   Renal cancer, left ([place]) Patient history of large volume left upper tract TCC status post left nephroureterectomy in 2015.  Recently found to have multifocal bladder recurrence  with abnormal retrograde and filling defect in the right distal ureter.  He had a partially obstructing ureteral tumor that was ablated and fulgurated with stent placement.  Surgical pathology was consistent with high-grade noninvasive tumor both in the ureter and the bladder.  CT noncontrast on 10/19/2021 showed no metastatic disease noticed.  For the high noninvasive tumor, he had cystoscopy and ureteroscopy with ultimate laser ablation of the tumor, stent exchange, intravesical gemcitabine.  Patient had hematuria prior to this operation that resolved postoperatively with good urine output per patient.  At that time, urology plan to begin intravesical BCG 6 weeks postop for a total of 6 weeks.  Urine culture prior to surgical intervention was positive for E. coli susceptible to Augmentin, completed this treatment prior to procedure.  - Monitor ins and outs - Will need follow-up with urology outpatient - UA/urine culture  HTN (hypertension) Holding any anti-hypertensive's; normotensive now. Home Amlodipine 5mg  per chart review but not in med rec.    Hyperlipidemia Home statin ordered. Needs UTD lipid panel.   CAD (coronary artery disease) Transfuse Hgb<8, cardiology following trop 103>awaiting following trop. S/p PTCA stent x2 2006.    Constipation: Senna PRN COPD: Restarted home inhalers  GERD: Protonix PO daily  DM2: Diet controlled, CBG 4x daily, A1C UTD ordered   FEN/GI: Regular diet  VTE Prophylaxis: Maryln Manuel; actively bleeding with anemia, will need Lovenox ordered if applicable during this admission.   Disposition: med tele   History of Present Illness:  Jacob [name] is a 89 y.o. male presenting with mechanical fall onset 1 day ago. He was getting off of the toilet and lost his balance, denies LOC, dizziness, light headedness. Did hit his head in the process, reports of back pain since his last surgery.   He reports that his lower back is painful, had second kyphoplasty  November 2023 and patient reports of persistent pain in the lower back pain, did not fall on the back during the fall.  Uses a walker to ambulate around the house.   In the ED, the patient had laceration repair to the head injury and ENT consulted. Additionally, had NSGY consulted who recommended TLSO brace and cardiology started amiodarone IV for Vtach. Ordered 1 U PRBCs for anemia.   Review Of Systems: Per HPI with the following additions: Denies SOB, chest pain, one sided weakness   Pertinent Past Medical History: Past Medical History:  Diagnosis Date   Anemia    Arthritis of both hands    BPH (benign prostatic hypertrophy)    COPD (chronic obstructive pulmonary disease) (HCC)    Coronary artery disease    a.) LHC 11/18/2004: 40% pLAD, 50% mLAD, 80% D1, 50% mLCx, 100% mRCA - [facility] to [facility] PCI; b.) LHC/PCI 11/21/2004: 100% mRCA (3.5 x 23 mm Cypher DES), 95% dRCA (2.5 x 28 mm Cypher); b.) 05/03/12 Cardiac CT: patent RCA stents, nonobs dzs;  c.) 01/2013 Ex MV: nl EF, no ischemia.   Diastolic dysfunction    a.) TTE 08/29/2013: EF 60-65%, mild LVH, mild BAE, mild MR, mod TR, G1DD; b.) TTE 07/01/2020: EF 50-55%, LAEm triv MR, mild-mod Ao sclerosis with no stenosis, G2DD   Dyspnea    GERD (gastroesophageal  reflux disease)    Glaucoma of both eyes    H/O hiatal hernia    History of atrial fibrillation 2006   History of diverticulitis    History of kidney stones    History of renal cell carcinoma    a.) s/p LEFT nephroureterectomy   Hyperlipidemia    Hypertension    LBBB (left bundle branch block)    Mobitz type 2 second degree AV block    a.) s/p PPM placement 08/29/2013   Pneumonia    Presence of permanent cardiac pacemaker    Recurrent bladder transitional cell carcinoma (HCC)    a.) s/p TURBT and intravesical chemotherapy   Renal insufficiency    a. Creat rose to 2.34 post-op L nephrectomy.   S/P placement of cardiac pacemaker 08/29/2013   a.) Medtroinc device placed for  symptomatic bradycardia and mobitz 2 second degree heart block   Symptomatic bradycardia    a.) s/p PPM placement 08/29/2013   Type 2 diabetes mellitus without complication, without long-term current use of insulin (HCC) 08/29/2017    Remainder reviewed in history tab.   Pertinent Past Surgical History: Past Surgical History:  Procedure Laterality Date   APPENDECTOMY  02/03/1988   BLADDER INSTILLATION N/A 10/17/2021   Procedure: BLADDER INSTILLATION OF GEMCITABINE;  Surgeon: Vanna Scotland, MD;  Location: [facility];  Service: Urology;  Laterality: N/A;   BLADDER INSTILLATION N/A 01/09/2022   Procedure: BLADDER INSTILLATION OF GEMCITABINE;  Surgeon: Vanna Scotland, MD;  Location: [facility];  Service: Urology;  Laterality: N/A;   CATARACT EXTRACTION W/ INTRAOCULAR LENS  IMPLANT, BILATERAL  01/02/2009   COLONOSCOPY     CORONARY ANGIOPLASTY WITH STENT PLACEMENT  11/21/2004   Procedure: CORONARY ANGIOPLASTY WITH STENT PLACEMENT (mRCA and dRCA); Location: [facility]; Surgeon: Renne Musca, MD   CYSTOSCOPY W/ RETROGRADES Right 01/13/2014   Procedure: CYSTOSCOPY WITH RETROGRADE PYELOGRAM;  Surgeon: Sebastian Ache, MD;  Location: [facility];  Service: Urology;  Laterality: Right;   CYSTOSCOPY W/ URETERAL STENT PLACEMENT Left 08/22/2013   Procedure: CYSTOSCOPY WITH RETROGRADE PYELOGRAM/URETERAL STENT PLACEMENT;  Surgeon: Sebastian Ache, MD;  Location: [facility];  Service: Urology;  Laterality: Left;   CYSTOSCOPY W/ URETERAL STENT PLACEMENT Right 10/17/2021   Procedure: CYSTOSCOPY WITH RETROGRADE PYELOGRAM/URETERAL STENT PLACEMENT;  Surgeon: Vanna Scotland, MD;  Location: [facility];  Service: Urology;  Laterality: Right;   CYSTOSCOPY WITH BIOPSY  04/13/2011   Procedure: CYSTOSCOPY WITH BIOPSY;  Surgeon: Kathi Ludwig, MD;  Location: [facility];  Service: Urology;  Laterality: N/A;  Cold Cup Biopsy   CYSTOSCOPY WITH FULGERATION Right 01/09/2022   Procedure: CYSTOSCOPY WITH LASER FULGERATION OF  URETERAL TUMOR;  Surgeon: Vanna Scotland, MD;  Location: [facility];  Service: Urology;  Laterality: Right;   CYSTOSCOPY WITH RETROGRADE PYELOGRAM, URETEROSCOPY AND STENT PLACEMENT Left 06/19/2013   Procedure: CYSTOSCOPY WITH LEFT RETROGRADE PYELOGRAM, LEFT URETEROSCOPY, ureteral balloon dilatation, BIOPSY LEFT KIDNEY;  Surgeon: Kathi Ludwig, MD;  Location: [facility];  Service: Urology;  Laterality: Left;   CYSTOSCOPY/URETEROSCOPY/HOLMIUM LASER/STENT PLACEMENT Right 01/09/2022   Procedure: CYSTOSCOPY/URETEROSCOPY/HOLMIUM LASER/STENT EXCHANGE;  Surgeon: Vanna Scotland, MD;  Location: [facility];  Service: Urology;  Laterality: Right;   EYE SURGERY     INGUINAL HERNIA REPAIR Left 08/22/2013   Procedure: LAPAROSCOPIC INGUINAL HERNIA;  Surgeon: Sebastian Ache, MD;  Location: [facility];  Service: Urology;  Laterality: Left;   INSERT / REPLACE / REMOVE PACEMAKER     IR KYPHO LUMBAR INC FX REDUCE BONE BX UNI/BIL CANNULATION INC/IMAGING  01/31/2021   IR KYPHO LUMBAR INC FX REDUCE BONE BX UNI/BIL CANNULATION INC/IMAGING  10/24/2021   LEFT HEART CATH AND CORONARY ANGIOGRAPHY Left 11/18/2004   Procedure: LEFT HEART CATH AND CORONARY ANGIOGRAPHY; Location: [facility]; Surgeon: Adrian Blackwater, MD   PERMANENT PACEMAKER INSERTION N/A 08/29/2013   Procedure: PERMANENT PACEMAKER INSERTION;  Surgeon: Marinus Maw, MD;  Location: [facility] C[facility];  Service: Cardiovascular;  Laterality: N/A;  Medtronic dual-chamber   ROBOT ASSITED LAPAROSCOPIC NEPHROURETERECTOMY Left 08/22/2013   Procedure: ROBOT ASSITED LAPAROSCOPIC NEPHROURETERECTOMY;  Surgeon: Sebastian Ache, MD;  Location: [facility];  Service: Urology;  Laterality: Left;   TRANSURETHRAL RESECTION OF BLADDER TUMOR  12/22/2010   TRANSURETHRAL RESECTION OF BLADDER TUMOR  08/04/2011   Procedure: TRANSURETHRAL RESECTION OF BLADDER TUMOR (TURBT);  Surgeon: Kathi Ludwig, MD;  Location[facility];  Service: Urology;  Laterality: N/A;    TRANSURETHRAL RESECTION OF BLADDER TUMOR N/A 01/13/2014   Procedure: TRANSURETHRAL RESECTION OF BLADDER TUMOR (TURBT);  Surgeon: Sebastian Ache, MD;  Location: [facility];  Service: Urology;  Laterality: N/A;   TRANSURETHRAL RESECTION OF BLADDER TUMOR  10/17/2021   Procedure: TRANSURETHRAL RESECTION OF BLADDER TUMOR (TURBT);  Surgeon: Vanna Scotland, MD;  Location: [facility];  Service: Urology;;   URETERAL BIOPSY  10/17/2021   Procedure: URETERAL BIOPSY;  Surgeon: Vanna Scotland, MD;  Location: [facility];  Service: Urology;;   URETEROSCOPY  10/17/2021   Procedure: URETEROSCOPY;  Surgeon: Vanna Scotland, MD;  Location: [facility];  Service: Urology;;    Girtha Rm reviewed in history tab.  Pertinent Social History: Tobacco use: Former--20 years ago  Alcohol use: None  Other Substance use: No Lives with Lives with daughter in the home   Pertinent Family History: Family History  Problem Relation Age of Onset   Heart attack Mother    Heart attack Father    Prostate cancer Neg Hx    Bladder Cancer Neg Hx    Kidney cancer Neg Hx      Remainder reviewed in history tab.   Important Outpatient Medications: Lipitor  Remainder reviewed in medication history.   Objective: BP 104/71   Pulse 95   Temp 98.5 F (36.9 C) (Oral)   Resp (!) 24   Ht 5\' 6"  (1.676 m)   Wt 68.9 kg   SpO2 99%   BMI 24.53 kg/m  Exam: General: Pleasant, elderly male with wrap around forehead and TLSO brace in place Eyes: PERRLA, normal conjunctivae,  ENTM: No obvious deformities or step-offs Neck: No obvious deformity or abrasion Cardiovascular: paced rhythm, no m/g/r Respiratory: Scattered expiratory wheezes with normal work of breathing and good aeration.  No focal diminishment Gastrointestinal: Nontender to palpation, nondistended, soft MSK: Good movement of upper and lower extremities  Derm: Recent basal cell carcinoma excision with frontal skull exposure.  Several lacerations extending  from the open skull in a star burst pattern.  No active bleeding with bandage CDI and no strikethrough.  Neuro: CN II: PERRL CN III, IV,VI: EOMI CV V: Normal sensation in V1, V2, V3 CN VIII: Normal hearing CN XI: 5/5 shoulder shrug UE and LE strength --gross weakness but equal bilaterally, 2-3/5 Normal sensation in UE and LE bilaterally  Psych: Normal affect and mood   Labs:  CBC BMET  Recent Labs  Lab 01/17/22 0708  WBC 9.5  HGB 7.7*  HCT 23.9*  PLT 107*   Recent Labs  Lab 01/17/22 0708  NA 134*  K 4.4  CL 98  CO2 23  BUN 51*  CREATININE 2.49*  GLUCOSE 176*  CALCIUM 9.0     EKG: sinus rhythm w/ PVC    Imaging Studies Performed:  CT HEAD WO CONTRAST  Result Date: 01/17/2022 CLINICAL DATA:  Polytrauma, blunt. EXAM: CT HEAD WITHOUT CONTRAST CT CERVICAL SPINE WITHOUT CONTRAST TECHNIQUE: Multidetector CT imaging of the head and cervical spine was performed following the standard protocol without intravenous contrast. Multiplanar CT image reconstructions of the cervical spine were also generated. RADIATION DOSE REDUCTION: This exam was performed according to the departmental dose-optimization program which includes automated exposure control, adjustment of the mA and/or kV according to patient size and/or use of iterative reconstruction technique. COMPARISON:  None Available. FINDINGS: CT HEAD FINDINGS Brain: Generalized age related parenchymal volume loss with commensurate dilatation of the ventricles and sulci. Mild chronic small vessel ischemic changes within the bilateral periventricular and subcortical white matter regions. No mass, hemorrhage, edema or other evidence of acute parenchymal abnormality. No extra-axial hemorrhage. Vascular: Chronic calcified atherosclerotic changes of the large vessels at the skull base. No unexpected hyperdense vessel. Skull: Normal. Negative for fracture or focal lesion. Sinuses/Orbits: No acute finding. Other: Scalp defect overlying the LEFT  frontal bone, with probable exposure of the frontal bone. No underlying fracture is seen. CT CERVICAL SPINE FINDINGS Alignment: Levoscoliosis. No evidence of acute vertebral body subluxation. Skull base and vertebrae: No fracture line or displaced fracture fragment is seen. Facet joints appear normally aligned. Soft tissues and spinal canal: No prevertebral fluid or swelling. No visible canal hematoma. Disc levels: Degenerative spondylosis throughout the cervical spine, at least moderate in degree causing mild to moderate central canal stenoses at the C4-5 through C7-T1 levels. Additional advanced degenerative hypertrophic changes of the facet and uncovertebral joints resulting in moderate to severe neural foramen stenoses at C3-4, C4-5, C5-6 and C6-7 with possible associated nerve root impingements. Upper chest: No acute findings. Emphysematous changes at the lung apices with associated pleural-parenchymal scarring/fibrosis. Other: Bilateral carotid atherosclerosis. IMPRESSION: 1. Scalp defect overlying the LEFT frontal bone, with probable exposure of the frontal bone. No underlying skull fracture is seen. 2. No acute intracranial abnormality. No intracranial mass, hemorrhage or edema. 3. No fracture or acute subluxation within the cervical spine. 4. Degenerative changes of the cervical spine, as detailed above. 5. Carotid atherosclerosis. Electronically Signed   By: Bary Richard M.D.   On: 01/17/2022 09:02   CT Cervical Spine Wo Contrast  Result Date: 01/17/2022 CLINICAL DATA:  Polytrauma, blunt. EXAM: CT HEAD WITHOUT CONTRAST CT CERVICAL SPINE WITHOUT CONTRAST TECHNIQUE: Multidetector CT imaging of the head and cervical spine was performed following the standard protocol without intravenous contrast. Multiplanar CT image reconstructions of the cervical spine were also generated. RADIATION DOSE REDUCTION: This exam was performed according to the departmental dose-optimization program which includes automated  exposure control, adjustment of the mA and/or kV according to patient size and/or use of iterative reconstruction technique. COMPARISON:  None Available. FINDINGS: CT HEAD FINDINGS Brain: Generalized age related parenchymal volume loss with commensurate dilatation of the ventricles and sulci. Mild chronic small vessel ischemic changes within the bilateral periventricular and subcortical white matter regions. No mass, hemorrhage, edema or other evidence of acute parenchymal abnormality. No extra-axial hemorrhage. Vascular: Chronic calcified atherosclerotic changes of the large vessels at the skull base. No unexpected hyperdense vessel. Skull: Normal. Negative for fracture or focal lesion. Sinuses/Orbits: No acute finding. Other: Scalp defect overlying the LEFT frontal bone, with probable exposure of the frontal bone. No underlying fracture is seen. CT  CERVICAL SPINE FINDINGS Alignment: Levoscoliosis. No evidence of acute vertebral body subluxation. Skull base and vertebrae: No fracture line or displaced fracture fragment is seen. Facet joints appear normally aligned. Soft tissues and spinal canal: No prevertebral fluid or swelling. No visible canal hematoma. Disc levels: Degenerative spondylosis throughout the cervical spine, at least moderate in degree causing mild to moderate central canal stenoses at the C4-5 through C7-T1 levels. Additional advanced degenerative hypertrophic changes of the facet and uncovertebral joints resulting in moderate to severe neural foramen stenoses at C3-4, C4-5, C5-6 and C6-7 with possible associated nerve root impingements. Upper chest: No acute findings. Emphysematous changes at the lung apices with associated pleural-parenchymal scarring/fibrosis. Other: Bilateral carotid atherosclerosis. IMPRESSION: 1. Scalp defect overlying the LEFT frontal bone, with probable exposure of the frontal bone. No underlying skull fracture is seen. 2. No acute intracranial abnormality. No intracranial  mass, hemorrhage or edema. 3. No fracture or acute subluxation within the cervical spine. 4. Degenerative changes of the cervical spine, as detailed above. 5. Carotid atherosclerosis. Electronically Signed   By: Bary Richard M.D.   On: 01/17/2022 09:02   DG Pelvis 1-2 Views  Result Date: 01/17/2022 CLINICAL DATA:  Fall EXAM: PELVIS - 1-2 VIEW COMPARISON:  None Available. FINDINGS: There is a possible nondisplaced left-sided sacral fracture in zone 2. There is moderate bilateral osteoarthritis. No other evidence of fracture in the pelvis on single frontal view. Partially visualized lower lumbar spine with prior vertebroplasty for compression fracture of L3. IMPRESSION: Possible nondisplaced left-sided sacral fracture in zone 2. Electronically Signed   By: Caprice Renshaw M.D.   On: 01/17/2022 08:20   DG Lumbar Spine 2-3 Views  Result Date: 01/17/2022 CLINICAL DATA:  Fall EXAM: LUMBAR SPINE - 2-3 VIEW COMPARISON:  Lumbar spine radiograph 01/16/2022, CT 10/19/2021 FINDINGS: There are chronic compression fractures of L1 and L3 with prior vertebroplasty. There is a compression deformity of L2, new since October 2023, with up to 25% height loss.There is multilevel degenerative disc disease, moderate and severe at L4-L5. There is moderate lower lumbar predominant facet arthropathy. Unchanged slight bony retropulsion of the superior endplate of L1. Right-sided nephroureteral stent noted. IMPRESSION: Chronic compression fractures of L1 and L3 with prior vertebroplasty and no evidence of progressive height loss. Compression fracture of L2 with up to 25% height loss, new since October 2023. Electronically Signed   By: Caprice Renshaw M.D.   On: 01/17/2022 08:17   DG Chest 1 View  Result Date: 01/17/2022 CLINICAL DATA:  Fall EXAM: CHEST  1 VIEW COMPARISON:  10/28/2021 FINDINGS: LEFT-sided pacer overlies stable cardiac silhouette. Scarring at the LEFT lung base. No effusion, infiltrate, pneumothorax. No fracture identified.  IMPRESSION: No evidence of thoracic trauma. Electronically Signed   By: Genevive Bi M.D.   On: 01/17/2022 08:12  \   Alfredo Martinez, MD 01/17/2022, 12:55 PM PGY-2, [facility]  FPTS Intern pager: [phone], text pages welcome Secure chat group [facility] Teaching Service

## 2022-01-17 NOTE — ED Notes (Signed)
Manual bladder scan performed.  No notable boundary to the bladder.  Insufficient volume to measure.

## 2022-01-17 NOTE — ED Notes (Signed)
ED TO INPATIENT HANDOFF REPORT  ED Nurse Name and Phone #: Vanetta Shawl [phone]  S Name/Age/Gender Jacob [name] 89 y.o. male Room/Bed: 037C/037C  Code Status   Code Status: Full Code  Home/SNF/Other Home Patient oriented to: self, place, time, and situation Is this baseline? Yes   Triage Complete: Triage complete  Chief Complaint Fall [W19.XXXA]  Triage Note Pt BIB by [facility] from home. Pt had an unwitnessed fall. No LOC reported, EMS reports a mechanical fall. Pt has a laceration on the left side of his forehead. Pt had a procedure 3 weeks ago where he had to remove some cancerous skin tissue from forehead, however it is now severely compromised. EMS witnessed a new depression on the top of the patient's head. Pt complains of back pain.   Pt is AO4  LVS 136/58  88% on RA but 96% on 4L  91.0 Temp Hx of cancer, COPD, CHF, and HTN. Pt is not on blood thinners but takes aspirin daily.    Allergies Allergies  Allergen Reactions   Penicillins Hives    BLISTERS    Level of Care/Admitting Diagnosis ED Disposition     ED Disposition  Admit   Condition  --   Comment  Hospital Area: [facility] [[id number]]  Level of Care: Telemetry Medical [104]  May place patient in observation at [facility] or [place] if equivalent level of care is available:: Yes  Covid Evaluation: Asymptomatic - no recent exposure (last 10 days) testing not required  Diagnosis: Fall [[id number]]  Admitting Physician: Lind Covert [id number]  Attending Physician: Lind Covert [[id number]]          B Medical/Surgery History Past Medical History:  Diagnosis Date   Anemia    Arthritis of both hands    BPH (benign prostatic hypertrophy)    COPD (chronic obstructive pulmonary disease) ([place])    Coronary artery disease    a.) LHC 11/18/2004: 40% pLAD, 50% mLAD, 80% D1, 50% mLCx, 100% mRCA - refer to [facility] for PCI; b.) LHC/PCI 11/21/2004: 100% mRCA (3.5 x 23  mm Cypher DES), 95% dRCA (2.5 x 28 mm Cypher); b.) 05/03/12 Cardiac CT: patent RCA stents, nonobs dzs;  c.) 01/2013 Ex MV: nl EF, no ischemia.   Diastolic dysfunction    a.) TTE 08/29/2013: EF 60-65%, mild LVH, mild BAE, mild MR, mod TR, G1DD; b.) TTE 07/01/2020: EF 50-55%, LAEm triv MR, mild-mod Ao sclerosis with no stenosis, G2DD   Dyspnea    GERD (gastroesophageal reflux disease)    Glaucoma of both eyes    H/O hiatal hernia    History of atrial fibrillation 2006   History of diverticulitis    History of kidney stones    History of renal cell carcinoma    a.) s/p LEFT nephroureterectomy   Hyperlipidemia    Hypertension    LBBB (left bundle branch block)    Mobitz type 2 second degree AV block    a.) s/p PPM placement 08/29/2013   Pneumonia    Presence of permanent cardiac pacemaker    Recurrent bladder transitional cell carcinoma ([place])    a.) s/p TURBT and intravesical chemotherapy   Renal insufficiency    a. Creat rose to 2.34 post-op L nephrectomy.   S/P placement of cardiac pacemaker 08/29/2013   a.) Medtroinc device placed for symptomatic bradycardia and mobitz 2 second degree heart block   Symptomatic bradycardia    a.) s/p PPM placement 08/29/2013   Type 2  diabetes mellitus without complication, without long-term current use of insulin (HCC) 08/29/2017   Past Surgical History:  Procedure Laterality Date   APPENDECTOMY  02/03/1988   BLADDER INSTILLATION N/A 10/17/2021   Procedure: BLADDER INSTILLATION OF GEMCITABINE;  Surgeon: Vanna Scotland, MD;  Location: [facility];  Service: Urology;  Laterality: N/A;   BLADDER INSTILLATION N/A 01/09/2022   Procedure: BLADDER INSTILLATION OF GEMCITABINE;  Surgeon: Vanna Scotland, MD;  Location: [facility];  Service: Urology;  Laterality: N/A;   CATARACT EXTRACTION W/ INTRAOCULAR LENS  IMPLANT, BILATERAL  01/02/2009   COLONOSCOPY     CORONARY ANGIOPLASTY WITH STENT PLACEMENT  11/21/2004   Procedure: CORONARY ANGIOPLASTY WITH STENT PLACEMENT  (mRCA and dRCA); Location: [facility]; Surgeon: Renne Musca, MD   CYSTOSCOPY W/ RETROGRADES Right 01/13/2014   Procedure: CYSTOSCOPY WITH RETROGRADE PYELOGRAM;  Surgeon: Sebastian Ache, MD;  Location: [facility];  Service: Urology;  Laterality: Right;   CYSTOSCOPY W/ URETERAL STENT PLACEMENT Left 08/22/2013   Procedure: CYSTOSCOPY WITH RETROGRADE PYELOGRAM/URETERAL STENT PLACEMENT;  Surgeon: Sebastian Ache, MD;  Location: [facility];  Service: Urology;  Laterality: Left;   CYSTOSCOPY W/ URETERAL STENT PLACEMENT Right 10/17/2021   Procedure: CYSTOSCOPY WITH RETROGRADE PYELOGRAM/URETERAL STENT PLACEMENT;  Surgeon: Vanna Scotland, MD;  Location: [facility];  Service: Urology;  Laterality: Right;   CYSTOSCOPY WITH BIOPSY  04/13/2011   Procedure: CYSTOSCOPY WITH BIOPSY;  Surgeon: Kathi Ludwig, MD;  Location: [facility];  Service: Urology;  Laterality: N/A;  Cold Cup Biopsy   CYSTOSCOPY WITH FULGERATION Right 01/09/2022   Procedure: CYSTOSCOPY WITH LASER FULGERATION OF URETERAL TUMOR;  Surgeon: Vanna Scotland, MD;  Location: [facility];  Service: Urology;  Laterality: Right;   CYSTOSCOPY WITH RETROGRADE PYELOGRAM, URETEROSCOPY AND STENT PLACEMENT Left 06/19/2013   Procedure: CYSTOSCOPY WITH LEFT RETROGRADE PYELOGRAM, LEFT URETEROSCOPY, ureteral balloon dilatation, BIOPSY LEFT KIDNEY;  Surgeon: Kathi Ludwig, MD;  Location: [facility];  Service: Urology;  Laterality: Left;   CYSTOSCOPY/URETEROSCOPY/HOLMIUM LASER/STENT PLACEMENT Right 01/09/2022   Procedure: CYSTOSCOPY/URETEROSCOPY/HOLMIUM LASER/STENT EXCHANGE;  Surgeon: Vanna Scotland, MD;  Location: [facility];  Service: Urology;  Laterality: Right;   EYE SURGERY     INGUINAL HERNIA REPAIR Left 08/22/2013   Procedure: LAPAROSCOPIC INGUINAL HERNIA;  Surgeon: Sebastian Ache, MD;  Location: [facility];  Service: Urology;  Laterality: Left;   INSERT / REPLACE / REMOVE PACEMAKER     IR KYPHO LUMBAR INC FX REDUCE BONE BX UNI/BIL  CANNULATION INC/IMAGING  01/31/2021   IR KYPHO LUMBAR INC FX REDUCE BONE BX UNI/BIL CANNULATION INC/IMAGING  10/24/2021   LEFT HEART CATH AND CORONARY ANGIOGRAPHY Left 11/18/2004   Procedure: LEFT HEART CATH AND CORONARY ANGIOGRAPHY; Location: [facility]; Surgeon: Adrian Blackwater, MD   PERMANENT PACEMAKER INSERTION N/A 08/29/2013   Procedure: PERMANENT PACEMAKER INSERTION;  Surgeon: Marinus Maw, MD;  Location: [facility] CATH LAB;  Service: Cardiovascular;  Laterality: N/A;  Medtronic dual-chamber   ROBOT ASSITED LAPAROSCOPIC NEPHROURETERECTOMY Left 08/22/2013   Procedure: ROBOT ASSITED LAPAROSCOPIC NEPHROURETERECTOMY;  Surgeon: Sebastian Ache, MD;  Location: [facility];  Service: Urology;  Laterality: Left;   TRANSURETHRAL RESECTION OF BLADDER TUMOR  12/22/2010   TRANSURETHRAL RESECTION OF BLADDER TUMOR  08/04/2011   Procedure: TRANSURETHRAL RESECTION OF BLADDER TUMOR (TURBT);  Surgeon: Kathi Ludwig, MD;  Location: [facility];  Service: Urology;  Laterality: N/A;   TRANSURETHRAL RESECTION OF BLADDER TUMOR N/A 01/13/2014   Procedure: TRANSURETHRAL RESECTION OF BLADDER TUMOR (TURBT);  Surgeon: Sebastian Ache, MD;  Location: [facility];  Service: Urology;  Laterality: N/A;   TRANSURETHRAL RESECTION OF BLADDER TUMOR  10/17/2021   Procedure: TRANSURETHRAL RESECTION OF BLADDER TUMOR (TURBT);  Surgeon: Vanna Scotland, MD;  Location: [facility];  Service: Urology;;   URETERAL BIOPSY  10/17/2021   Procedure: URETERAL BIOPSY;  Surgeon: Vanna Scotland, MD;  Location: [facility];  Service: Urology;;   URETEROSCOPY  10/17/2021   Procedure: URETEROSCOPY;  Surgeon: Vanna Scotland, MD;  Location: [facility];  Service: Urology;;     A IV Location/Drains/Wounds Patient Lines/Drains/Airways Status     Active Line/Drains/Airways     Name Placement date Placement time Site Days   Peripheral IV 01/17/22 20 G Right Antecubital 01/17/22  0708  Antecubital  less than 1   Peripheral IV  01/17/22 20 G Anterior;Left Forearm 01/17/22  1145  Forearm  less than 1   Ureteral Drain/Stent Right ureter 6 Fr. 01/09/22  1623  Right ureter  8   External Urinary Catheter 01/17/22  0900  --  less than 1            Intake/Output Last 24 hours  Intake/Output Summary (Last 24 hours) at 01/17/2022 1652 Last data filed at 01/17/2022 1500 Gross per 24 hour  Intake 1325 ml  Output --  Net 1325 ml    Labs/Imaging Results for orders placed or performed during the hospital encounter of 01/17/22 (from the past 48 hour(s))  Basic metabolic panel     Status: Abnormal   Collection Time: 01/17/22  7:08 AM  Result Value Ref Range   Sodium 134 (L) 135 - 145 mmol/L   Potassium 4.4 3.5 - 5.1 mmol/L   Chloride 98 98 - 111 mmol/L   CO2 23 22 - 32 mmol/L   Glucose, Bld 176 (H) 70 - 99 mg/dL    Comment: Glucose reference range applies only to samples taken after fasting for at least 8 hours.   BUN 51 (H) 8 - 23 mg/dL   Creatinine, Ser 8.07 (H) 0.61 - 1.24 mg/dL   Calcium 9.0 8.9 - 94.6 mg/dL   GFR, Estimated 24 (L) >60 mL/min    Comment: (NOTE) Calculated using the CKD-EPI Creatinine Equation (2021)    Anion gap 13 5 - 15    Comment: Performed at [facility], [address] [place], [place] [address]  CBC     Status: Abnormal   Collection Time: 01/17/22  7:08 AM  Result Value Ref Range   WBC 9.5 4.0 - 10.5 K/uL   RBC 2.47 (L) 4.22 - 5.81 MIL/uL   Hemoglobin 7.7 (L) 13.0 - 17.0 g/dL   HCT 08.9 (L) 72.7 - 10.4 %   MCV 96.8 80.0 - 100.0 fL   MCH 31.2 26.0 - 34.0 pg   MCHC 32.2 30.0 - 36.0 g/dL   RDW 51.9 06.8 - 17.8 %   Platelets 107 (L) 150 - 400 K/uL   nRBC 0.0 0.0 - 0.2 %    Comment: Performed at [facility], [address], [place], [place] [address]  ABO/Rh     Status: None   Collection Time: 01/17/22  7:08 AM  Result Value Ref Range   ABO/RH(D)      O POS Performed at [facility], [address], [place], [place] [address]   CBG monitoring, ED      Status: Abnormal   Collection Time: 01/17/22  7:21 AM  Result Value Ref Range   Glucose-Capillary 164 (H) 70 - 99 mg/dL  Comment: Glucose reference range applies only to samples taken after fasting for at least 8 hours.  Troponin I (High Sensitivity)     Status: Abnormal   Collection Time: 01/17/22  9:42 AM  Result Value Ref Range   Troponin I (High Sensitivity) 103 (HH) <18 ng/L    Comment: CRITICAL RESULT CALLED TO, READ BACK BY AND VERIFIED WITH N.Doss [email or web address]  16.01.2024 E.Ahmed (NOTE) Elevated high sensitivity troponin I (hsTnI) values and significant  changes across serial measurements may suggest ACS but many other  chronic and acute conditions are known to elevate hsTnI results.  Refer to the "Links" section for chest pain algorithms and additional  guidance. Performed at [facility], [place] [address], [place], [place] [address]   Magnesium     Status: None   Collection Time: 01/17/22  9:42 AM  Result Value Ref Range   Magnesium 1.8 1.7 - 2.4 mg/dL    Comment: Performed at [place] [place] [address] [place], [place] [address]  Prepare RBC (crossmatch)     Status: None   Collection Time: 01/17/22 10:15 AM  Result Value Ref Range   Order Confirmation      ORDER PROCESSED BY BLOOD BANK Performed at [facility], [place] [address] [place], [place] [address]   Type and screen [place]     Status: None (Preliminary result)   Collection Time: 01/17/22 10:15 AM  Result Value Ref Range   ABO/RH(D) O POS    Antibody Screen NEG    Sample Expiration 01/20/2022,2359    Unit Number [id number]    Blood Component Type RED CELLS,LR    Unit division 00    Status of Unit ISSUED    Transfusion Status OK TO TRANSFUSE    Crossmatch Result      Compatible Performed at [facility], [place] [address], [place], [place] [address]   Troponin I (High Sensitivity)     Status: Abnormal   Collection Time: 01/17/22 11:41 AM  Result Value Ref Range    Troponin I (High Sensitivity) 99 (H) <18 ng/L    Comment: (NOTE) Elevated high sensitivity troponin I (hsTnI) values and significant  changes across serial measurements may suggest ACS but many other  chronic and acute conditions are known to elevate hsTnI results.  Refer to the "Links" section for chest pain algorithms and additional  guidance. Performed at [facility], [place] [address], [place], [place] [address]   CBG monitoring, ED     Status: Abnormal   Collection Time: 01/17/22 12:52 PM  Result Value Ref Range   Glucose-Capillary 142 (H) 70 - 99 mg/dL    Comment: Glucose reference range applies only to samples taken after fasting for at least 8 hours.   CT HEAD WO CONTRAST  Result Date: 01/17/2022 CLINICAL DATA:  Polytrauma, blunt. EXAM: CT HEAD WITHOUT CONTRAST CT CERVICAL SPINE WITHOUT CONTRAST TECHNIQUE: Multidetector CT imaging of the head and cervical spine was performed following the standard protocol without intravenous contrast. Multiplanar CT image reconstructions of the cervical spine were also generated. RADIATION DOSE REDUCTION: This exam was performed according to the departmental dose-optimization program which includes automated exposure control, adjustment of the mA and/or kV according to patient size and/or use of iterative reconstruction technique. COMPARISON:  None Available. FINDINGS: CT HEAD FINDINGS Brain: Generalized age related parenchymal volume loss with commensurate dilatation of the ventricles and sulci. Mild chronic small vessel ischemic changes within the bilateral periventricular and  subcortical white matter regions. No mass, hemorrhage, edema or other evidence of acute parenchymal abnormality. No extra-axial hemorrhage. Vascular: Chronic calcified atherosclerotic changes of the large vessels at the skull base. No unexpected hyperdense vessel. Skull: Normal. Negative for fracture or focal lesion. Sinuses/Orbits: No acute finding. Other: Scalp defect  overlying the LEFT frontal bone, with probable exposure of the frontal bone. No underlying fracture is seen. CT CERVICAL SPINE FINDINGS Alignment: Levoscoliosis. No evidence of acute vertebral body subluxation. Skull base and vertebrae: No fracture line or displaced fracture fragment is seen. Facet joints appear normally aligned. Soft tissues and spinal canal: No prevertebral fluid or swelling. No visible canal hematoma. Disc levels: Degenerative spondylosis throughout the cervical spine, at least moderate in degree causing mild to moderate central canal stenoses at the C4-5 through C7-T1 levels. Additional advanced degenerative hypertrophic changes of the facet and uncovertebral joints resulting in moderate to severe neural foramen stenoses at C3-4, C4-5, C5-6 and C6-7 with possible associated nerve root impingements. Upper chest: No acute findings. Emphysematous changes at the lung apices with associated pleural-parenchymal scarring/fibrosis. Other: Bilateral carotid atherosclerosis. IMPRESSION: 1. Scalp defect overlying the LEFT frontal bone, with probable exposure of the frontal bone. No underlying skull fracture is seen. 2. No acute intracranial abnormality. No intracranial mass, hemorrhage or edema. 3. No fracture or acute subluxation within the cervical spine. 4. Degenerative changes of the cervical spine, as detailed above. 5. Carotid atherosclerosis. Electronically Signed   By: Bary Richard M.D.   On: 01/17/2022 09:02   CT Cervical Spine Wo Contrast  Result Date: 01/17/2022 CLINICAL DATA:  Polytrauma, blunt. EXAM: CT HEAD WITHOUT CONTRAST CT CERVICAL SPINE WITHOUT CONTRAST TECHNIQUE: Multidetector CT imaging of the head and cervical spine was performed following the standard protocol without intravenous contrast. Multiplanar CT image reconstructions of the cervical spine were also generated. RADIATION DOSE REDUCTION: This exam was performed according to the departmental dose-optimization program which  includes automated exposure control, adjustment of the mA and/or kV according to patient size and/or use of iterative reconstruction technique. COMPARISON:  None Available. FINDINGS: CT HEAD FINDINGS Brain: Generalized age related parenchymal volume loss with commensurate dilatation of the ventricles and sulci. Mild chronic small vessel ischemic changes within the bilateral periventricular and subcortical white matter regions. No mass, hemorrhage, edema or other evidence of acute parenchymal abnormality. No extra-axial hemorrhage. Vascular: Chronic calcified atherosclerotic changes of the large vessels at the skull base. No unexpected hyperdense vessel. Skull: Normal. Negative for fracture or focal lesion. Sinuses/Orbits: No acute finding. Other: Scalp defect overlying the LEFT frontal bone, with probable exposure of the frontal bone. No underlying fracture is seen. CT CERVICAL SPINE FINDINGS Alignment: Levoscoliosis. No evidence of acute vertebral body subluxation. Skull base and vertebrae: No fracture line or displaced fracture fragment is seen. Facet joints appear normally aligned. Soft tissues and spinal canal: No prevertebral fluid or swelling. No visible canal hematoma. Disc levels: Degenerative spondylosis throughout the cervical spine, at least moderate in degree causing mild to moderate central canal stenoses at the C4-5 through C7-T1 levels. Additional advanced degenerative hypertrophic changes of the facet and uncovertebral joints resulting in moderate to severe neural foramen stenoses at C3-4, C4-5, C5-6 and C6-7 with possible associated nerve root impingements. Upper chest: No acute findings. Emphysematous changes at the lung apices with associated pleural-parenchymal scarring/fibrosis. Other: Bilateral carotid atherosclerosis. IMPRESSION: 1. Scalp defect overlying the LEFT frontal bone, with probable exposure of the frontal bone. No underlying skull fracture is seen. 2. No acute intracranial  abnormality.  No intracranial mass, hemorrhage or edema. 3. No fracture or acute subluxation within the cervical spine. 4. Degenerative changes of the cervical spine, as detailed above. 5. Carotid atherosclerosis. Electronically Signed   By: Bary Richard M.D.   On: 01/17/2022 09:02   DG Pelvis 1-2 Views  Result Date: 01/17/2022 CLINICAL DATA:  Fall EXAM: PELVIS - 1-2 VIEW COMPARISON:  None Available. FINDINGS: There is a possible nondisplaced left-sided sacral fracture in zone 2. There is moderate bilateral osteoarthritis. No other evidence of fracture in the pelvis on single frontal view. Partially visualized lower lumbar spine with prior vertebroplasty for compression fracture of L3. IMPRESSION: Possible nondisplaced left-sided sacral fracture in zone 2. Electronically Signed   By: Caprice Renshaw M.D.   On: 01/17/2022 08:20   DG Lumbar Spine 2-3 Views  Result Date: 01/17/2022 CLINICAL DATA:  Fall EXAM: LUMBAR SPINE - 2-3 VIEW COMPARISON:  Lumbar spine radiograph 01/16/2022, CT 10/19/2021 FINDINGS: There are chronic compression fractures of L1 and L3 with prior vertebroplasty. There is a compression deformity of L2, new since October 2023, with up to 25% height loss.There is multilevel degenerative disc disease, moderate and severe at L4-L5. There is moderate lower lumbar predominant facet arthropathy. Unchanged slight bony retropulsion of the superior endplate of L1. Right-sided nephroureteral stent noted. IMPRESSION: Chronic compression fractures of L1 and L3 with prior vertebroplasty and no evidence of progressive height loss. Compression fracture of L2 with up to 25% height loss, new since October 2023. Electronically Signed   By: Caprice Renshaw M.D.   On: 01/17/2022 08:17   DG Chest 1 View  Result Date: 01/17/2022 CLINICAL DATA:  Fall EXAM: CHEST  1 VIEW COMPARISON:  10/28/2021 FINDINGS: LEFT-sided pacer overlies stable cardiac silhouette. Scarring at the LEFT lung base. No effusion, infiltrate,  pneumothorax. No fracture identified. IMPRESSION: No evidence of thoracic trauma. Electronically Signed   By: Genevive Bi M.D.   On: 01/17/2022 08:12   DG Lumbar Spine Complete  Result Date: 01/16/2022 CLINICAL DATA:  Back pain. EXAM: LUMBAR SPINE - COMPLETE 5 VIEW COMPARISON:  None Available. FINDINGS: Compression deformities with vertebroplasties at L1 and L3. No spondylolisthesis. Osseous structures are osteopenic. Dense atheromatous calcification of the aorta is incidentally noted. There is a right ureteral stent. IMPRESSION: Osteopenia. L3 and L1 compression deformities status post vertebroplasty. Electronically Signed   By: Layla Maw M.D.   On: 01/16/2022 13:05    Pending Labs Unresulted Labs (From admission, onward)     Start     Ordered   01/18/22 0500  Basic metabolic panel  Daily,   R      01/17/22 1128   01/18/22 0500  CBC  Daily,   R      01/17/22 1128   01/18/22 0500  Hemoglobin A1c  Tomorrow morning,   R        01/17/22 1154   01/17/22 1159  Urine Culture  (Urine Culture)  Once,   R       Question:  Indication  Answer:  Dysuria   01/17/22 1159   01/17/22 0653  Urinalysis, Routine w reflex microscopic  Once,   URGENT        01/17/22 0654            Vitals/Pain Today's Vitals   01/17/22 1500 01/17/22 1530 01/17/22 1604 01/17/22 1604  BP: (!) 121/54 110/88  (!) 95/47  Pulse: 95 96  93  Resp: (!) 24   (!) 26  Temp: 99.3 F (37.4 C)  TempSrc: Oral     SpO2: 96% 94%  93%  Weight:      Height:      PainSc:   5      Isolation Precautions No active isolations  Medications Medications  amiodarone (NEXTERONE) 1.8 mg/mL load via infusion 150 mg (150 mg Intravenous Bolus from Bag 01/17/22 1058)    Followed by  amiodarone (NEXTERONE PREMIX) 360-4.14 MG/200ML-% (1.8 mg/mL) IV infusion (60 mg/hr Intravenous New Bag/Given 01/17/22 1122)    Followed by  amiodarone (NEXTERONE PREMIX) 360-4.14 MG/200ML-% (1.8 mg/mL) IV infusion (has no administration in time  range)  atorvastatin (LIPITOR) tablet 40 mg (has no administration in time range)  pantoprazole (PROTONIX) EC tablet 40 mg (has no administration in time range)  senna-docusate (Senokot-S) tablet 1 tablet (has no administration in time range)  latanoprost (XALATAN) 0.005 % ophthalmic solution 1 drop (has no administration in time range)  albuterol (PROVENTIL) (2.5 MG/3ML) 0.083% nebulizer solution 2.5 mg (has no administration in time range)  Budeson-Glycopyrrol-Formoterol 160-9-4.8 MCG/ACT AERO (has no administration in time range)  acetaminophen (TYLENOL) tablet 650 mg (650 mg Oral Given 01/17/22 1502)    Or  acetaminophen (TYLENOL) suppository 650 mg ( Rectal See Alternative 01/17/22 1502)  lidocaine-EPINEPHrine (XYLOCAINE W/EPI) 2 %-1:200000 (PF) injection 10 mL (10 mLs Intradermal Given 01/17/22 1008)  sodium chloride 0.9 % bolus 1,000 mL (0 mLs Intravenous Stopped 01/17/22 1005)  sodium chloride 0.9 % bolus 1,000 mL (1,000 mLs Intravenous New Bag/Given 01/17/22 1009)  fentaNYL (SUBLIMAZE) injection 50 mcg (50 mcg Intravenous Given 01/17/22 0859)  fentaNYL (SUBLIMAZE) injection 50 mcg (50 mcg Intravenous Given 01/17/22 1017)  0.9 %  sodium chloride infusion (Manually program via Guardrails IV Fluids) (0 mLs Intravenous Stopped 01/17/22 1500)  morphine (PF) 4 MG/ML injection 4 mg (4 mg Intravenous Given 01/17/22 1547)    Mobility walks     Focused Assessments Cardiac Assessment Handoff:    Lab Results  Component Value Date   TROPONINI 0.05 (H) 03/27/2015   No results found for: "DDIMER" Does the Patient currently have chest pain? No    R Recommendations: See Admitting Provider Note  Report given to:   Additional Notes:

## 2022-01-17 NOTE — Progress Notes (Signed)
Orthopedic Tech Progress Note Patient Details:  Jacob [name] 1928/12/04 [id number]  Ortho Devices Type of Ortho Device: Thoracolumbar corset (TLSO) Ortho Device/Splint Interventions: Ordered, Application   Post Interventions Patient Tolerated: Well  [name] E [name] 01/17/2022, 10:50 AM

## 2022-01-17 NOTE — ED Provider Notes (Signed)
[facility] EMERGENCY DEPARTMENT Provider Note   CSN: [id number] Arrival date & time: 01/17/22  1313     History  No chief complaint on file.   Jacob [name] is a 89 y.o. male.  The history is provided by the patient, the EMS personnel and medical records. No language interpreter was used.     89 year old male with history of COPD, CHF, HTN, cancer and other co morbidities BIBA after unwitnessed fall this morning. Pt is A&O x4 and  denies any loss of consciousness after the fall and reports he fell while trying to get off the toilet. He is currently taking 81 mg aspirin daily but no other blood thinners. He denies any chest pain, headache or dizziness prior or after the fall. He recently had skin cancer removed from his forehead which is now compromised and a new dent was seen on his scalp by EMS. He has a history of low back pain that is currently bothering him but reports no other areas of pain or known injuries. He has a solitary right kidney, left kidney removed back in 2015.  Home Medications Prior to Admission medications   Medication Sig Start Date End Date Taking? Authorizing Provider  acetaminophen (TYLENOL) 325 MG tablet Take 650 mg by mouth every 4 (four) hours as needed.    [provider]  amoxicillin-clavulanate (AUGMENTIN) 875-125 MG tablet Take 1 tablet by mouth every 12 (twelve) hours. 12/22/21   Vanna Scotland, MD  aspirin 81 MG tablet Take 81 mg by mouth daily.    [provider]  atorvastatin (LIPITOR) 40 MG tablet TAKE 1 TABLET BY MOUTH ONCE DAILY. APPOINTMENT NEEDED FOR FURTHER REFILLS. 12/08/21   Duke Salvia, MD  Budeson-Glycopyrrol-Formoterol (BREZTRI AEROSPHERE) 160-9-4.8 MCG/ACT AERO Inhale into the lungs in the morning and at bedtime.    [provider]  cetirizine (ZYRTEC) 10 MG tablet Take 10 mg by mouth as needed.    [provider]  esomeprazole (NEXIUM) 20 MG capsule Take 20 mg by mouth as  needed. 05/24/20   [provider]  latanoprost (XALATAN) 0.005 % ophthalmic solution Place 1 drop into both eyes at bedtime.    [provider]  Multiple Vitamin (MULTIVITAMIN) tablet Take 1 tablet by mouth daily.    [provider]  nystatin-triamcinolone ointment (MYCOLOG) Apply 1 Application topically 2 (two) times daily. Patient taking differently: Apply 1 Application topically daily. 12/15/21   Vanna Scotland, MD  Omega-3 Fatty Acids (FISH OIL) 1200 MG CAPS Take by mouth daily.    [provider]  PROAIR HFA 108 (90 Base) MCG/ACT inhaler Inhale 1 puff into the lungs every 4 (four) hours as needed.  06/14/16   [provider]  senna-docusate (SENOKOT-S) 8.6-50 MG per tablet Take 1 tablet by mouth 2 (two) times daily. While taking pain meds to prevent constipation Patient taking differently: Take 1 tablet by mouth at bedtime as needed. While taking pain meds to prevent constipation 08/26/13   Sebastian Ache, MD  triamcinolone cream (KENALOG) 0.1 % 2 (two) times daily. 10/11/21   [provider]      Allergies    Penicillins    Review of Systems   Review of Systems  All other systems reviewed and are negative.   Physical Exam Updated Vital Signs BP (!) 120/47   Pulse 90   Temp 98.8 F (37.1 C) (Rectal)   Resp 18   SpO2 99%  Physical Exam Vitals and nursing note  reviewed.  Constitutional:      General: He is not in acute distress.    Appearance: He is well-developed.     Comments: Patient is alert and answering question appropriately.  HENT:     Head: Normocephalic.     Comments: Laceration noted to vertex of scalp approximately 2 cm in diameter not actively bleeding. There is a 4 cm oval skin excision to the frontal scalp with skull exposed and on the edge there is a 2 cm vertical laceration anteriorly extending from the skin excision, bleeding a small amount.  Surrounding ecchymosis noted.  No midface tenderness. Eyes:      Extraocular Movements: Extraocular movements intact.     Conjunctiva/sclera: Conjunctivae normal.     Pupils: Pupils are equal, round, and reactive to light.  Neck:     Comments: No midline spine tenderness. Cardiovascular:     Rate and Rhythm: Tachycardia present. Rhythm irregular.     Pulses: Normal pulses.     Heart sounds: Normal heart sounds.  Pulmonary:     Effort: Pulmonary effort is normal.     Breath sounds: Normal breath sounds.  Abdominal:     Palpations: Abdomen is soft.     Tenderness: There is no abdominal tenderness.  Musculoskeletal:        General: Tenderness (Tenderness to lumbar spine and right hip on palpation) present.     Cervical back: Normal range of motion and neck supple.  Skin:    Findings: No rash.  Neurological:     Mental Status: He is alert and oriented to person, place, and time.     ED Results / Procedures / Treatments   Labs (all labs ordered are listed, but only abnormal results are displayed) Labs Reviewed  CBG MONITORING, ED - Abnormal; Notable for the following components:      Result Value   Glucose-Capillary 164 (*)    All other components within normal limits  BASIC METABOLIC PANEL  CBC  URINALYSIS, ROUTINE W REFLEX MICROSCOPIC    EKG EKG Interpretation  Date/Time:  Tuesday January 17 2022 07:11:46 EST Ventricular Rate:  93 PR Interval:  188 QRS Duration: 178 QT Interval:  417 QTC Calculation: 519 R Axis:   -79 Text Interpretation: Sinus rhythm Ventricular premature complex Nonspecific IVCD with LAD LVH with secondary repolarization abnormality Confirmed by Zadie Rhine ([id number]) on 01/17/2022 7:22:54 AM  Radiology DG Lumbar Spine Complete  Result Date: 01/16/2022 CLINICAL DATA:  Back pain. EXAM: LUMBAR SPINE - COMPLETE 5 VIEW COMPARISON:  None Available. FINDINGS: Compression deformities with vertebroplasties at L1 and L3. No spondylolisthesis. Osseous structures are osteopenic. Dense atheromatous calcification of  the aorta is incidentally noted. There is a right ureteral stent. IMPRESSION: Osteopenia. L3 and L1 compression deformities status post vertebroplasty. Electronically Signed   By: Layla Maw M.D.   On: 01/16/2022 13:05    Procedures .Critical Care  Performed by: Fayrene Helper, PA-C Authorized by: Fayrene Helper, PA-C   Critical care provider statement:    Critical care time (minutes):  80   Critical care was time spent personally by me on the following activities:  Development of treatment plan with patient or surrogate, discussions with consultants, evaluation of patient's response to treatment, examination of patient, ordering and review of laboratory studies, ordering and review of radiographic studies, ordering and performing treatments and interventions, pulse oximetry, re-evaluation of patient's condition and review of old charts .Marland KitchenLaceration Repair  Date/Time: 01/17/2022 10:26 AM  Performed by: Fayrene Helper, PA-C Authorized by:  Fayrene Helper, PA-C   Consent:    Consent obtained:  Verbal   Consent given by:  Patient   Risks, benefits, and alternatives were discussed: yes     Risks discussed:  Infection, poor cosmetic result and poor wound healing   Alternatives discussed:  No treatment Universal protocol:    Procedure explained and questions answered to patient or proxy's satisfaction: yes     Relevant documents present and verified: yes     Test results available: yes     Imaging studies available: yes     Patient identity confirmed:  Verbally with patient and arm band Anesthesia:    Anesthesia method:  Topical application and local infiltration   Local anesthetic:  Lidocaine 2% WITH epi Laceration details:    Location:  Scalp   Scalp location:  Frontal   Length (cm):  4   Depth (mm):  3 Pre-procedure details:    Preparation:  Patient was prepped and draped in usual sterile fashion and imaging obtained to evaluate for foreign bodies Exploration:    Limited defect created  (wound extended): no     Hemostasis achieved with:  Epinephrine   Imaging outcome: foreign body not noted     Wound exploration: wound explored through full range of motion and entire depth of wound visualized     Wound extent: no foreign body, no signs of injury and no underlying fracture     Contaminated: no   Treatment:    Area cleansed with:  Saline   Amount of cleaning:  Standard   Irrigation solution:  Sterile saline   Irrigation method:  Pressure wash   Debridement:  None   Undermining:  None   Scar revision: no   Skin repair:    Repair method:  Sutures   Suture size:  5-0   Suture material:  Prolene   Suture technique:  Simple interrupted   Number of sutures:  4 Approximation:    Approximation:  Close Repair type:    Repair type:  Intermediate Post-procedure details:    Dressing:  Non-adherent dressing   Procedure completion:  Tolerated well, no immediate complications     Medications Ordered in ED Medications  amiodarone (NEXTERONE) 1.8 mg/mL load via infusion 150 mg (150 mg Intravenous Bolus from Bag 01/17/22 1058)    Followed by  amiodarone (NEXTERONE PREMIX) 360-4.14 MG/200ML-% (1.8 mg/mL) IV infusion (has no administration in time range)    Followed by  amiodarone (NEXTERONE PREMIX) 360-4.14 MG/200ML-% (1.8 mg/mL) IV infusion (has no administration in time range)  0.9 %  sodium chloride infusion (Manually program via Guardrails IV Fluids) (has no administration in time range)  lidocaine-EPINEPHrine (XYLOCAINE W/EPI) 2 %-1:200000 (PF) injection 10 mL (10 mLs Intradermal Given 01/17/22 1008)  sodium chloride 0.9 % bolus 1,000 mL (0 mLs Intravenous Stopped 01/17/22 1005)  sodium chloride 0.9 % bolus 1,000 mL (1,000 mLs Intravenous New Bag/Given 01/17/22 1009)  fentaNYL (SUBLIMAZE) injection 50 mcg (50 mcg Intravenous Given 01/17/22 0859)  fentaNYL (SUBLIMAZE) injection 50 mcg (50 mcg Intravenous Given 01/17/22 1017)    ED Course/ Medical Decision Making/ A&P                              Medical Decision Making Amount and/or Complexity of Data Reviewed Labs: ordered. Radiology: ordered.  Risk Prescription drug management. Decision regarding hospitalization.   BP (!) 98/47 (BP Location: Left Arm)   Pulse 85   Temp 99.1  F (37.3 C) (Oral)   Resp 19   SpO2 99%   7:31 AM This is a 89 year old male with significant history of CAD, hypertension, symptomatic bradycardia status post pacemaker, COPD, skin cancer with recent Mohs procedure to his frontal scalp in December, CKD brought here via EMS from home for evaluation of a fall.  Patient is a good historian.  Patient report he was using the bathroom, got up, and subsequently states he tripped and fell striking his head but denies any loss of consciousness.  He is complaining of pain in his head and his lower back.  Pain is mild when he rest worse with movement.  EMS was called and did note a large deformity to his frontal scalp.  Patient was also noted to have an O2 sats of 88% on room air initially improved to 96% on 4 L.  8:43 AM Patient has a maker from [employer], it was interrogated and it was noted that patient has multiple runs of V. tach leading up to this event and he has had several runs of V. tach throughout the week as well.  [employer] will fax the results but I will consult cardiology for their input.  After receiving IV fluid, blood pressure did improve to 101/56.  Initially it was in the 16X systolic.  Patient endorsed worsening lower back pain, I have order fentanyl for pain control and will monitor closely.  Since patient has a Mohs procedure on his frontal scalp with a large area of exposed skull and now having injury to that side with multiple laceration extending from the margin, I will consult ENT for recommendations of surgical management of this wound.  9:08 AM Appreciate consultation from cardiology team and I spoke with Trish in regards to patient's runs of V. tach on his pacemaker.   Cardiology team will be involved in patient care.  Plan to consult medicine for admission.  Due to his fall and is complaining of low back pain, x-ray of lumbar spine as well as pelvis was obtained.  X-ray demonstrate compression fracture of L2 with up to 25% height loss which is new since October 2023.  Patient also has a possible nondisplaced left sided sacral fracture in zone 2.  These are closed injury.  Will consult neurosurgery for their involvement.  CT scan of the head and cervical spine was obtained independently viewed interpreted by me and I agree with radiology interpretation.  CTs demonstrate a scalp defect overlying the left frontal bone with probable exposure of the frontal bone but no underlying skull fracture is seen.  Otherwise no acute intracranial abnormalities such as mass hemorrhage or edema.  Cervical spine showing degenerative change but no acute injury.  10:09 AM Cardiology team has seen and evaluated patient.  Felt that his run of V. tach occurred after his fall.  Furthermore he is not a candidate for exchange of pacemaker.  Cardiology plan to place patient on rate control medication and will follow along.  Laceration of the scalp was repaired by me using absorbable sutures.  Wound was dressed appropriately.  He has a small skin tear to the vertex of scalp not amenable for laceration repair.  I have also ordered a TLSO brace for his lower back and provide additional pain medication.  Due to worsening renal function, drop in his hemoglobin, and electrolyte abnormalities, will consult medicine for admission.  Today's hemoglobin is 7.7.  It was 9.1 approximately 2 months ago.  Patient report he was having hematuria related  to bladder cancer and recently had a ureteral stent placed.  He denies any hematuria at this time.  He did not notice any abnormal rectal bleeding or black and tarry stool.  He is amenable for blood transfusion.  Will transfuse 1 unit as patient is likely  having symptomatic anemia.  As far as his L2 fracture and sacral fracture, I have discussed with neurosurgeon Dr. Maisie Fus, who will review x-ray and recommend TLSO and patient may follow-up with his neurosurgeon outpatient for further management.  He has had prior kyphoplasty.  11:01 AM Appreciate consultation from family medicine resident who agrees to see and will admit patient for further management of his condition.        Final Clinical Impression(s) / ED Diagnoses Final diagnoses:  Closed compression fracture of L2 lumbar vertebra, initial encounter (HCC)  Closed nondisplaced zone II fracture of sacrum, initial encounter (HCC)  V-tach (HCC)  Laceration of scalp, initial encounter  Symptomatic anemia  AKI (acute kidney injury) [facility])    Rx / DC Orders ED Discharge Orders     None         Fayrene Helper, PA-C 01/17/22 1106    Zadie Rhine, MD 01/17/22 2333

## 2022-01-17 NOTE — Assessment & Plan Note (Deleted)
Ambulates w/ walker at baseline. Fall most likely from instability. CT scan without acute changes/neurologically intact.  - Fall precautions - PT/OT evaluate and treat Pain control:   - scheduled tylenol   - Intranasal calcitonin   - dilaudid 1 mg tablet q4h PRN

## 2022-01-17 NOTE — ED Notes (Signed)
[facility] paging teaching service regarding patient's BP

## 2022-01-17 NOTE — Assessment & Plan Note (Deleted)
Holding any anti-hypertensive's; normotensive now. Home Amlodipine 5mg  per chart review but not in med rec.

## 2022-01-17 NOTE — ED Triage Notes (Signed)
Pt BIB by [facility] from home. Pt had an unwitnessed fall. No LOC reported, EMS reports a mechanical fall. Pt has a laceration on the left side of his forehead. Pt had a procedure 3 weeks ago where he had to remove some cancerous skin tissue from forehead, however it is now severely compromised. EMS witnessed a new depression on the top of the patient's head. Pt complains of back pain.   Pt is AO4  LVS 136/58  88% on RA but 96% on 4L  91.0 Temp Hx of cancer, COPD, CHF, and HTN. Pt is not on blood thinners but takes aspirin daily.

## 2022-01-17 NOTE — Assessment & Plan Note (Signed)
Home Senna PRN

## 2022-01-17 NOTE — Assessment & Plan Note (Addendum)
Stable - Amiodarone 400 mg p.o. BID (1/17 - 1/20) - Start Amiodarone 200 mg BID (1/21 - 1/27) - Start Amiodarone 200 mg daily (1/28)

## 2022-01-18 ENCOUNTER — Observation Stay (HOSPITAL_COMMUNITY): Payer: Medicare Other

## 2022-01-18 DIAGNOSIS — R509 Fever, unspecified: Secondary | ICD-10-CM | POA: Diagnosis present

## 2022-01-18 DIAGNOSIS — R7881 Bacteremia: Secondary | ICD-10-CM | POA: Diagnosis not present

## 2022-01-18 DIAGNOSIS — Z515 Encounter for palliative care: Secondary | ICD-10-CM | POA: Diagnosis not present

## 2022-01-18 DIAGNOSIS — E785 Hyperlipidemia, unspecified: Secondary | ICD-10-CM | POA: Diagnosis present

## 2022-01-18 DIAGNOSIS — E119 Type 2 diabetes mellitus without complications: Secondary | ICD-10-CM | POA: Diagnosis not present

## 2022-01-18 DIAGNOSIS — A4152 Sepsis due to Pseudomonas: Secondary | ICD-10-CM | POA: Diagnosis present

## 2022-01-18 DIAGNOSIS — S0101XA Laceration without foreign body of scalp, initial encounter: Secondary | ICD-10-CM | POA: Diagnosis present

## 2022-01-18 DIAGNOSIS — D62 Acute posthemorrhagic anemia: Secondary | ICD-10-CM | POA: Diagnosis present

## 2022-01-18 DIAGNOSIS — I5032 Chronic diastolic (congestive) heart failure: Secondary | ICD-10-CM | POA: Diagnosis present

## 2022-01-18 DIAGNOSIS — N135 Crossing vessel and stricture of ureter without hydronephrosis: Secondary | ICD-10-CM | POA: Diagnosis present

## 2022-01-18 DIAGNOSIS — D631 Anemia in chronic kidney disease: Secondary | ICD-10-CM | POA: Diagnosis present

## 2022-01-18 DIAGNOSIS — Z87891 Personal history of nicotine dependence: Secondary | ICD-10-CM | POA: Diagnosis not present

## 2022-01-18 DIAGNOSIS — N189 Chronic kidney disease, unspecified: Secondary | ICD-10-CM | POA: Diagnosis not present

## 2022-01-18 DIAGNOSIS — S32020A Wedge compression fracture of second lumbar vertebra, initial encounter for closed fracture: Secondary | ICD-10-CM | POA: Diagnosis present

## 2022-01-18 DIAGNOSIS — E1122 Type 2 diabetes mellitus with diabetic chronic kidney disease: Secondary | ICD-10-CM | POA: Diagnosis present

## 2022-01-18 DIAGNOSIS — I442 Atrioventricular block, complete: Secondary | ICD-10-CM | POA: Diagnosis present

## 2022-01-18 DIAGNOSIS — S32010D Wedge compression fracture of first lumbar vertebra, subsequent encounter for fracture with routine healing: Secondary | ICD-10-CM | POA: Diagnosis not present

## 2022-01-18 DIAGNOSIS — N179 Acute kidney failure, unspecified: Secondary | ICD-10-CM | POA: Diagnosis not present

## 2022-01-18 DIAGNOSIS — Z79899 Other long term (current) drug therapy: Secondary | ICD-10-CM | POA: Diagnosis not present

## 2022-01-18 DIAGNOSIS — Y92012 Bathroom of single-family (private) house as the place of occurrence of the external cause: Secondary | ICD-10-CM | POA: Diagnosis not present

## 2022-01-18 DIAGNOSIS — C661 Malignant neoplasm of right ureter: Secondary | ICD-10-CM | POA: Diagnosis present

## 2022-01-18 DIAGNOSIS — S32120A Nondisplaced Zone II fracture of sacrum, initial encounter for closed fracture: Secondary | ICD-10-CM | POA: Diagnosis present

## 2022-01-18 DIAGNOSIS — N170 Acute kidney failure with tubular necrosis: Secondary | ICD-10-CM | POA: Diagnosis present

## 2022-01-18 DIAGNOSIS — I132 Hypertensive heart and chronic kidney disease with heart failure and with stage 5 chronic kidney disease, or end stage renal disease: Secondary | ICD-10-CM | POA: Diagnosis present

## 2022-01-18 DIAGNOSIS — E43 Unspecified severe protein-calorie malnutrition: Secondary | ICD-10-CM | POA: Diagnosis present

## 2022-01-18 DIAGNOSIS — W010XXA Fall on same level from slipping, tripping and stumbling without subsequent striking against object, initial encounter: Secondary | ICD-10-CM | POA: Diagnosis present

## 2022-01-18 DIAGNOSIS — G8929 Other chronic pain: Secondary | ICD-10-CM | POA: Diagnosis not present

## 2022-01-18 DIAGNOSIS — Z66 Do not resuscitate: Secondary | ICD-10-CM | POA: Diagnosis not present

## 2022-01-18 DIAGNOSIS — B965 Pseudomonas (aeruginosa) (mallei) (pseudomallei) as the cause of diseases classified elsewhere: Secondary | ICD-10-CM | POA: Diagnosis not present

## 2022-01-18 DIAGNOSIS — I472 Ventricular tachycardia, unspecified: Secondary | ICD-10-CM | POA: Diagnosis present

## 2022-01-18 DIAGNOSIS — N390 Urinary tract infection, site not specified: Secondary | ICD-10-CM | POA: Diagnosis present

## 2022-01-18 DIAGNOSIS — M545 Low back pain, unspecified: Secondary | ICD-10-CM | POA: Diagnosis not present

## 2022-01-18 DIAGNOSIS — N186 End stage renal disease: Secondary | ICD-10-CM | POA: Diagnosis present

## 2022-01-18 DIAGNOSIS — R52 Pain, unspecified: Secondary | ICD-10-CM | POA: Diagnosis not present

## 2022-01-18 DIAGNOSIS — Z950 Presence of cardiac pacemaker: Secondary | ICD-10-CM | POA: Diagnosis not present

## 2022-01-18 DIAGNOSIS — J449 Chronic obstructive pulmonary disease, unspecified: Secondary | ICD-10-CM | POA: Diagnosis present

## 2022-01-18 DIAGNOSIS — D6959 Other secondary thrombocytopenia: Secondary | ICD-10-CM | POA: Diagnosis present

## 2022-01-18 DIAGNOSIS — Z7189 Other specified counseling: Secondary | ICD-10-CM | POA: Diagnosis not present

## 2022-01-18 LAB — CBC
HCT: 26 % — ABNORMAL LOW (ref 39.0–52.0)
Hemoglobin: 8.7 g/dL — ABNORMAL LOW (ref 13.0–17.0)
MCH: 30.7 pg (ref 26.0–34.0)
MCHC: 33.5 g/dL (ref 30.0–36.0)
MCV: 91.9 fL (ref 80.0–100.0)
Platelets: 74 10*3/uL — ABNORMAL LOW (ref 150–400)
RBC: 2.83 MIL/uL — ABNORMAL LOW (ref 4.22–5.81)
RDW: 16.4 % — ABNORMAL HIGH (ref 11.5–15.5)
WBC: 8 10*3/uL (ref 4.0–10.5)
nRBC: 0 % (ref 0.0–0.2)

## 2022-01-18 LAB — GLUCOSE, CAPILLARY
Glucose-Capillary: 113 mg/dL — ABNORMAL HIGH (ref 70–99)
Glucose-Capillary: 127 mg/dL — ABNORMAL HIGH (ref 70–99)
Glucose-Capillary: 131 mg/dL — ABNORMAL HIGH (ref 70–99)
Glucose-Capillary: 138 mg/dL — ABNORMAL HIGH (ref 70–99)
Glucose-Capillary: 150 mg/dL — ABNORMAL HIGH (ref 70–99)

## 2022-01-18 LAB — BASIC METABOLIC PANEL
Anion gap: 9 (ref 5–15)
BUN: 56 mg/dL — ABNORMAL HIGH (ref 8–23)
CO2: 22 mmol/L (ref 22–32)
Calcium: 8.4 mg/dL — ABNORMAL LOW (ref 8.9–10.3)
Chloride: 103 mmol/L (ref 98–111)
Creatinine, Ser: 2.65 mg/dL — ABNORMAL HIGH (ref 0.61–1.24)
GFR, Estimated: 22 mL/min — ABNORMAL LOW (ref >60)
Glucose, Bld: 121 mg/dL — ABNORMAL HIGH (ref 70–99)
Potassium: 4.4 mmol/L (ref 3.5–5.1)
Sodium: 134 mmol/L — ABNORMAL LOW (ref 135–145)

## 2022-01-18 LAB — URINALYSIS, ROUTINE W REFLEX MICROSCOPIC
Bilirubin Urine: NEGATIVE
Glucose, UA: NEGATIVE mg/dL
Ketones, ur: NEGATIVE mg/dL
Nitrite: NEGATIVE
Protein, ur: 100 mg/dL — AB
RBC / HPF: >50 RBC/hpf — ABNORMAL HIGH (ref 0–5)
Specific Gravity, Urine: 1.016 (ref 1.005–1.030)
WBC, UA: >50 WBC/hpf — ABNORMAL HIGH (ref 0–5)
pH: 5 (ref 5.0–8.0)

## 2022-01-18 LAB — TYPE AND SCREEN
ABO/RH(D): O POS
Antibody Screen: NEGATIVE
Unit division: 0

## 2022-01-18 LAB — BPAM RBC
Blood Product Expiration Date: 202402102359
ISSUE DATE / TIME: 202401161252
Unit Type and Rh: 5100

## 2022-01-18 LAB — HEMOGLOBIN A1C
Hgb A1c MFr Bld: 6 % — ABNORMAL HIGH (ref 4.8–5.6)
Mean Plasma Glucose: 125.5 mg/dL

## 2022-01-18 LAB — MAGNESIUM: Magnesium: 2.5 mg/dL — ABNORMAL HIGH (ref 1.7–2.4)

## 2022-01-18 MED ORDER — SODIUM CHLORIDE 0.9 % IV SOLN
2.0000 g | INTRAVENOUS | Status: DC
  Filled 2022-01-18: qty 12.5

## 2022-01-18 MED ORDER — SODIUM CHLORIDE 0.9 % IV SOLN: 2.0000 g | INTRAVENOUS | Status: DC

## 2022-01-18 MED ORDER — BOOST PLUS PO LIQD
237.0000 mL | Freq: Three times a day (TID) | ORAL | Status: DC
  Administered 2022-01-18 – 2022-01-23 (×13): 237 mL via ORAL
  Filled 2022-01-18 (×17): qty 237

## 2022-01-18 MED ORDER — VANCOMYCIN HCL 1500 MG/300ML IV SOLN
1500.0000 mg | Freq: Once | INTRAVENOUS | Status: DC
  Filled 2022-01-18: qty 300

## 2022-01-18 MED ORDER — ENOXAPARIN SODIUM 30 MG/0.3ML IJ SOSY
30.0000 mg | PREFILLED_SYRINGE | INTRAMUSCULAR | Status: DC
  Administered 2022-01-18 – 2022-01-19 (×2): 30 mg via SUBCUTANEOUS
  Filled 2022-01-18 (×2): qty 0.3

## 2022-01-18 MED ORDER — AMIODARONE HCL 200 MG PO TABS
400.0000 mg | ORAL_TABLET | Freq: Two times a day (BID) | ORAL | Status: DC
  Administered 2022-01-18 – 2022-01-20 (×5): 400 mg via ORAL
  Filled 2022-01-18 (×5): qty 2

## 2022-01-18 MED ORDER — VANCOMYCIN HCL 750 MG/150ML IV SOLN: 750.0000 mg | INTRAVENOUS | Status: DC

## 2022-01-18 MED ORDER — ADULT MULTIVITAMIN W/MINERALS CH
1.0000 | ORAL_TABLET | Freq: Every day | ORAL | Status: DC
  Administered 2022-01-18 – 2022-01-22 (×5): 1 via ORAL
  Filled 2022-01-18 (×5): qty 1

## 2022-01-18 NOTE — Progress Notes (Signed)
Pharmacy Antibiotic Note  Jacob [name] is a 89 y.o. male admitted on 01/17/2022 with  unexplained fever . Patient has PMH of renal cell carcinoma s/p left nephrectomy, bladder tumor, skin cancer of the head, COPD, and advanced AV block s/p pacemaker who presented after a fall. Patient had slight fever of 100.4 in the morning. Pharmacy has been consulted for cefepime and vancomycin dosing. WBC downtrending from 9.5 to 8, afebrile since this AM, and Cr increasing from 2.49 to 2.65 (bl~1.7) and CrCl 16.4 ml/min  Plan: Vancomycin 1500mg  IV once and followed by 750 mg q48 hours (IBW, eAUC 420, Vd 0.72) Cefepime 2g every 24 hours Monitor kidney function, culture data, fever curve, and clinical signs of improvement  Height: 5\' 6"  (167.6 cm) Weight: 68.9 kg (152 lb) IBW/kg (Calculated) : 63.8  Temp (24hrs), Avg:99 F (37.2 C), Min:98.1 F (36.7 C), Max:100.4 F (38 C)  Recent Labs  Lab 01/17/22 0708 01/17/22 1739 01/17/22 2111 01/18/22 0652  WBC 9.5  --   --  8.0  CREATININE 2.49*  --   --  2.65*  LATICACIDVEN  --  1.1 1.6  --     Estimated Creatinine Clearance: 16.4 mL/min (A) (by C-G formula based on SCr of 2.65 mg/dL (H)).    Allergies  Allergen Reactions   Penicillins Hives    BLISTERS    Antimicrobials this admission: Cefepime 1/17>> Vanc 1/17>>   Microbiology results: 1/17 BCx: not collected 1/17 UCx: sent   Thank you for allowing pharmacy to be a part of this patient's care.  2/17, PharmD. [facility] Acute Care PGY-1  01/18/2022 8:35 AM

## 2022-01-18 NOTE — Hospital Course (Addendum)
Jacob [name] is a 89 y.o. male with PMHx renal cell carcinoma with recent reoccurrence, COPD with no O2 at baseline, hyperlipidemia, advanced AV block s/p DDD PPM 2015, NSVT, PVCs, excised infiltrative basal cell carcinoma on the face presenting with fall without loss of consciousness but did hit head during the process.  His hospital course is outlined below:   Goals of Care: [facility] course was complicated by acutely worsening renal function. Nephrology was consulted but determined he was poor dialysis candidate. Palliative was consulted, had family meeting to have GOC discussion. Pt was transitioned to comfort care and discharged to hospice.   Fall  Lumbar Fracture  Sacral Fracture  Head Laceration:  Presented to the ED s/p mechanical fall due to loss of balance and hitting his head. Patient denies LOC. In the ED, the patient had laceration repair to the head injury. CT head showed no fracture or acute intracranial abnormality.  DG lumbar spine showed L3 and L1 compression deformity s/p vertebroplasty. DG pelvis showed nondisplaced left sided sacral fracture in zone 2.  NSGY consulted who recommended TLSO brace and follow up outpatient. The patient's mental status remained stable. His pain was treated with tylenol, dilaudid, and intranasal calcitonin.   V Tach:  On admission, his pacemaker was interrogated and showed sustained V tach outpatient. The timing did not coincide with the fall. Cardiology was consulted and started an amiodarone drip on 1/16. He was transitioned to PO amiodarone.   GU Cancer  Anemia: On admission Hgb found to be 7.7. Transfusion threshold >8 due to CAD. He was transfused 1 unit RBC on 1/16. Post H&H resulted to 8.1. Anemia was thought to be due to chronic hematuria outpatient secondary to bladder and ureter cancer and trauma from the fall. He did not have hematuria while hospitalized.   Bacteremia due to Pseudomonas:  Febrile to 100.4 on 1/17. Blood  cultures drawn on 1/17 positive for pseudomonas. ID was consulted due to implanted pacemaker. Pt was treated w/ Cefepime, then transitioned to meropenem. Echocardiogram obtained and was within normal limits negative for vegetations. Antibiotics were discontinued when discharged to hospice.   CKD now progressed to ERSD:  Has hx of CKD. Cr on admission close ot baseline to 2.4. Patient was given x2 boluses in the ED then remained on maintenance IVF for several days. His Cr began to increase with max being 4.02. His urine output began slowing and bladder scans showed no retention. Nephrology was consulted, but pt is poor dialysis candidate.

## 2022-01-18 NOTE — Progress Notes (Signed)
Daily Progress Note Intern Pager: [phone]  Patient name: Jacob [name] Medical record number: [id number] Date of birth: 05-02-1928 Age: 89 y.o. Gender: male  Primary Care Provider: Sherrie Mustache, MD Consultants: NSGY, Cardiology, ENT Code Status: Full code   [name]: Admitted   Assessment and Plan:  Jacob [name] is a 89 y.o. male with PMHx renal cell carcinoma with recent reoccurrence, COPD with no O2 at baseline, hyperlipidemia, advanced AV block s/p DDD PPM 2015, NSVT, PVCs, excised infiltrative basal cell carcinoma on the face presenting with fall without loss of consciousness but did hit head during the process.   Febrile overnight to 100.4 with quick resolution without antipyretics and removing blankets from bed. VS have been stable, blood pressures better this morning. UA and CXR negative for concerns of infection. Holding antibiotics at this time, monitoring fever curve, following up blood culture and urine culture. If source is identified will treat with antibiotics. Most likely fever was in the setting of acute trauma of yesterday.   * V tach (HCC) Sustained V. tach on device interrogation from [employer] pacemaker.   - Amiodarone IV to PO today  - Cardiology following, appreciate recommendations   Recent unexplained fever Overnight febrile to T max of 100.4 @0350  this morning, patient has not received tylenol since 1500 1/16. Unclear source of fever, but resolved with removing blankets from bed. Afebrile since 0500 without antipyretics. As the patient is not showing signs of sepsis, will not treat with antibiotics at this time.  - blood cx obtained  - f/u urine cx  - monitor fever curve  - DC vancomycin and cefepime per pharmacy   Lumbar compression fracture [facility]) Hx kyphoplasty x2, last procedure November 2023.  XR L-spine new compression fracture of L2 with up to 25% height loss. Sacral XR: nondisplaced left-sided sacral  fracture in zone 2. NSGY consulted in ED and recommended TLSO brace and follow up with NSGY outpatient.  - TLSO brace in place  - NSGY follow up  - Ortho recs? For sacral fx   Head injury due to trauma Hx MOHs w/o full skin closure as local skin laxity was not sufficient for layered closure. CT head: exposure of the frontal bone, no fracture. - F/U dermatology outpatient  - continue with wound care and keeping clean and dry   Fall Ambulates w/ walker at baseline. Fall most likely from instability. CT scan without acute changes/neurologically intact.  - Fall precautions - PT/OT evaluate and treat  Renal cancer, left [facility]) Recent stent placed outpatient, Surgical pathology: high-grade noninvasive tumor both in the ureter and the bladder. Urology plans to begin intravesical BCG 6 weeks postop for a total of 6 weeks.  - Monitor ins and outs - Will need follow-up with urology outpatient - urine culture  AKI (acute kidney injury) (HCC) See history in RCC problem. AKI on admission 1.94>2.49>2.65. Continue monitoring, and hold nephrotoxic medications.  - Encourage hydration - Strict I/Os  - daily BMP   HTN (hypertension) Holding any anti-hypertensive's; normotensive now. Home Amlodipine 5mg  per chart review but not in med rec.      FEN/GI: regular diet  PPx: Ted hose, holding prophylaxis while monitoring for bleeding  Dispo:Pending PT recommendations  pending clinical improvement . Barriers include ongoing medical workup.   Subjective:  Resting comfortably. NAEO.   Objective: Temp:  [98.1 F (36.7 C)-100.4 F (38 C)] 98.9 F (37.2 C) (01/17 0731) Pulse Rate:  [69-97]  72 (01/17 0830) Resp:  [16-33] 22 (01/17 0830) BP: (79-132)/(43-88) 132/53 (01/17 0731) SpO2:  [91 %-100 %] 100 % (01/17 0731) FiO2 (%):  [24 %] 24 % (01/17 0830) Physical Exam: Chronically ill-appearing, frail, no acute distress Cardio: Regular rate, regular rhythm, no murmurs on exam. Pulm: Clear, no  wheezing, no crackles. No increased work of breathing Abdominal: bowel sounds present, soft, non-tender, non-distended Extremities: no peripheral edema, still in brace  Neuro: alert and oriented x3, speech normal in content, no facial asymmetry  Laboratory: Most recent CBC Lab Results  Component Value Date   WBC 8.0 01/18/2022   HGB 8.7 (L) 01/18/2022   HCT 26.0 (L) 01/18/2022   MCV 91.9 01/18/2022   PLT 74 (L) 01/18/2022   Most recent BMP    Latest Ref Rng & Units 01/18/2022    6:52 AM  BMP  Glucose 70 - 99 mg/dL 641   BUN 8 - 23 mg/dL 56   Creatinine 3.30 - 1.24 mg/dL 7.55   Sodium 279 - 233 mmol/L 134   Potassium 3.5 - 5.1 mmol/L 4.4   Chloride 98 - 111 mmol/L 103   CO2 22 - 32 mmol/L 22   Calcium 8.9 - 10.3 mg/dL 8.4    [name], DO 01/18/2022, 11:11 AM  PGY-1, [facility] FPTS Intern pager: [phone], text pages welcome Secure chat group [facility] Teaching Service

## 2022-01-18 NOTE — Progress Notes (Addendum)
Initial Nutrition Assessment  DOCUMENTATION CODES:  Severe malnutrition in context of chronic illness  INTERVENTION:  Continue current diet as ordered Change to ordering assistance Nursing to staff to continue assisting with tray setup and opening packages Boost Plus TID to provide 360kcal and 14g of protein per serving MVI with minerals daily Magic cup TID with meals, each supplement provides 290 kcal and 9 grams of protein  NUTRITION DIAGNOSIS:  Severe Malnutrition related to chronic illness as evidenced by percent weight loss, severe muscle depletion, severe fat depletion.  GOAL:  Patient will meet greater than or equal to 90% of their needs  MONITOR:  PO intake, Labs, Weight trends, Skin, Supplement acceptance  REASON FOR ASSESSMENT:  Malnutrition Screening Tool    ASSESSMENT:  Pt with hx of GERD, atrial fibrillation, HLD, HTN, CAD, COPD, DM type 2, hx renal cancer (s/p left nephroureterectomy), and hx bladder cancer (s/p TURBT and intravesical chemotherapy) presented to ED after a fall at home where he did hit his head.   Pt resting in bed at the time of assessment, visitor at bedside.  Significant weight loss of 20.1% x 1 year and 10.6% x 3 months both time frames have severe loss. Pt endorses weight loss and states that he has a poor appetite and his weight has been decreasing because of this.   Discussed recent intake. Pt reports that he is not eating very much while admitted. States that he does not have any trouble chewing or swallowing, but that his movement is being limited by the braces he is currently in. States that his nursing staff has been assisting him with opening packages and cutting his food and he is appreciative of this. Pt does like ice cream, agreeable to magic cup and also states he prefers boost plus over ensure. Typically drink 1-2 at home at baseline.  Significant muscle and fat depletions present on exam suggestive of chronic malnutrition.    Nutritionally Relevant Medications: Scheduled Meds:  atorvastatin  40 mg Oral Daily   pantoprazole  40 mg Oral Daily   Continuous Infusions:  lactated ringers 100 mL/hr at 01/18/22 0636   PRN Meds: senna-docusate  Labs Reviewed: Na 134 BUN 56, creatinine 2.65 CBG ranges from 127-164 mg/dL over the last 24 hours HgbA1c 6.0%  NUTRITION - FOCUSED PHYSICAL EXAM: Flowsheet Row Most Recent Value  Orbital Region Severe depletion  Upper Arm Region Severe depletion  Thoracic and Lumbar Region Moderate depletion  Buccal Region Severe depletion  Temple Region Moderate depletion  Clavicle Bone Region Severe depletion  Clavicle and Acromion Bone Region Severe depletion  Scapular Bone Region Moderate depletion  Dorsal Hand Mild depletion  Patellar Region Severe depletion  Anterior Thigh Region Severe depletion  Posterior Calf Region Mild depletion  Edema (RD Assessment) None  Hair Reviewed  Eyes Reviewed  Mouth Reviewed  Skin Reviewed  Nails Reviewed    Diet Order:   Diet Order             Diet regular Room service appropriate? Yes with Assist; Fluid consistency: Thin  Diet effective now                   EDUCATION NEEDS:  Education needs have been addressed  Skin:  Skin Assessment: Reviewed RN Assessment (laceration to the left face from fall)  Last BM:  1/15  Height:  Ht Readings from Last 1 Encounters:  01/17/22 5\' 6"  (1.676 m)    Weight:  Wt Readings from Last 1 Encounters:  01/17/22 68.9 kg    Ideal Body Weight:  64.5 kg  BMI:  Body mass index is 24.53 kg/m.  Estimated Nutritional Needs:  Kcal:  1800-2000 kcal/d Protein:  85-100g/d Fluid:  1.8-2L/d    Greig Castilla, RD, LDN Clinical Dietitian RD pager # available in [facility]  After hours/weekend pager # available in [facility]

## 2022-01-18 NOTE — TOC Initial Note (Signed)
Transition of Care [facility]) - Initial/Assessment Note    Patient Details  Name: Jacob [name] MRN: [id number] Date of Birth: [birth year]  [facility] of [facility]) CM/SW Contact:    Tory Emerald, LCSW Phone Number: 01/18/2022, 2:43 PM  Clinical Narrative:                 CSW met with pt at bedside to complete assessment. Pt identified by name and dob and is aaox4. Pt engaged well and appears adaptive to situation and recommendations made by PT for SNF placement. Pt reports he was at a SNF October of 2023 at [facility]. CSW has completed work up and faxed out. Pt would like for CSW to f/u with his son on 01/19/22 between 8am and 12p, as he works 3rd shift.   Expected Discharge Plan: Skilled Nursing Facility Barriers to Discharge: No SNF bed   Patient Goals and CMS Choice Patient states their goals for this hospitalization and ongoing recovery are:: to remain independent CMS Medicare.gov Compare Post Acute Care list provided to:: Patient Choice offered to / list presented to : Patient, Adult Children      Expected Discharge Plan and Services       Living arrangements for the past 2 months: Single Family Home                                      Prior Living Arrangements/Services Living arrangements for the past 2 months: Single Family Home Lives with:: Self Patient language and need for interpreter reviewed:: Yes Do you feel safe going back to the place where you live?: Yes      Need for Family Participation in Patient Care: Yes (Comment) Care giver support system in place?: Yes (comment)   Criminal Activity/Legal Involvement Pertinent to Current Situation/Hospitalization: No - Comment as needed  Activities of Daily Living Home Assistive Devices/Equipment: Walker (specify type), Other (Comment), Grab bars in shower ADL Screening (condition at time of admission) Patient's cognitive ability adequate to safely complete daily activities?: Yes Is the patient deaf or have  difficulty hearing?: No Does the patient have difficulty seeing, even when wearing glasses/contacts?: No Does the patient have difficulty concentrating, remembering, or making decisions?: No Patient able to express need for assistance with ADLs?: Yes Does the patient have difficulty dressing or bathing?: Yes Independently performs ADLs?: No Communication: Independent Dressing (OT): Independent Grooming: Independent Feeding: Independent Bathing: Needs assistance Is this a change from baseline?: Pre-admission baseline Toileting: Independent In/Out Bed: Needs assistance Is this a change from baseline?: Change from baseline, expected to last <3 days Walks in Home: Independent with device (comment) Does the patient have difficulty walking or climbing stairs?: Yes Weakness of Legs: Both Weakness of Arms/Hands: None  Permission Sought/Granted                  Emotional Assessment Appearance:: Appears stated age Attitude/Demeanor/Rapport: Engaged Affect (typically observed): Adaptable Orientation: : Oriented to Self, Oriented to Place, Oriented to  Time, Oriented to Situation Alcohol / Substance Use: Not Applicable Psych Involvement: No (comment)  Admission diagnosis:  V-tach [facility]) [I47.20] Fall [W19.XXXA] AKI (acute kidney injury) (HCC) [N17.9] Closed nondisplaced zone II fracture of sacrum, initial encounter (HCC) [S32.120A] Laceration of scalp, initial encounter [S01.01XA] Symptomatic anemia [D64.9] Closed compression fracture of L2 lumbar vertebra, initial encounter [facility]) [S32.020A] Patient Active Problem List   Diagnosis Date Noted   Recent unexplained fever 01/18/2022  Fall 01/17/2022   Head injury due to trauma 01/17/2022   V tach (HCC) 01/17/2022   Lumbar compression fracture (HCC) 01/17/2022   Orthostatic hypotension 10/28/2021   Lumbar radiculopathy 10/20/2021   Inadequate pain control 10/18/2021   Heart failure with preserved ejection fraction (HCC) 10/18/2021    Dysuria 10/18/2021   AKI (acute kidney injury) (HCC) 01/30/2021   CKD stage 4 secondary to hypertension (HCC) 01/26/2021   Closed compression fracture of L4 lumbar vertebra, initial encounter (HCC) 01/26/2021   Hyperkalemia 01/26/2021   Back pain 01/25/2021   Type 2 diabetes mellitus without complication, without long-term current use of insulin (HCC) 08/29/2017   History of bladder cancer 05/09/2016   Nodule of left lung 05/07/2016   COPD (chronic obstructive pulmonary disease) (HCC) 03/28/2015   Bladder carcinoma (HCC) 03/28/2015   Acute and chronic respiratory failure with hypoxia (HCC) 03/28/2015   GERD (gastroesophageal reflux disease) 03/28/2015   Pacemaker 12/01/2013   Mobitz type 2 second degree AV block 12/01/2013   Acquired solitary kidney 10/27/2013   Stage 3a chronic kidney disease (CKD) (HCC) 10/27/2013   Personal history of renal cell cancer 10/27/2013   Bradycardia 08/28/2013   Symptomatic bradycardia 08/28/2013   Sinus bradycardia    Renal insufficiency    HTN (hypertension)    Renal cancer, left (HCC)    Urothelial carcinoma of kidney (HCC) 08/22/2013   Left bundle branch block 02/14/2013   CAD (coronary artery disease)    Hyperlipidemia    PCP:  Sherrie Mustache, MD Pharmacy:   [facility] [address]), [place] - [address] Jerilynn Mages [place]) [place] [address] Phone: [phone] Fax: [phone]     Social Determinants of Health (SDOH) Social History: SDOH Screenings   Food Insecurity: No Food Insecurity (01/17/2022)  Housing: Low Risk  (01/17/2022)  Transportation Needs: No Transportation Needs (01/17/2022)  Utilities: Not At Risk (01/17/2022)  Tobacco Use: Medium Risk (01/17/2022)   SDOH Interventions:     Readmission Risk Interventions     No data to display

## 2022-01-18 NOTE — Progress Notes (Signed)
Rounding Note    Patient Name: Jacob [name] Date of Encounter: 01/18/2022  [facility] Cardiologist: Sherryl Manges, MD   Subjective   NAEO. No further sustained VT episodes.  Inpatient Medications    Scheduled Meds:  atorvastatin  40 mg Oral Daily   latanoprost  1 drop Both Eyes QHS   mometasone-formoterol  2 puff Inhalation BID   And   umeclidinium bromide  1 puff Inhalation Daily   pantoprazole  40 mg Oral Daily   Continuous Infusions:  amiodarone 30 mg/hr (01/18/22 0224)   lactated ringers 100 mL/hr at 01/18/22 0546   PRN Meds: acetaminophen **OR** acetaminophen, albuterol, HYDROmorphone (DILAUDID) injection, senna-docusate   Vital Signs    Vitals:   01/17/22 2315 01/18/22 0000 01/18/22 0350 01/18/22 0512  BP: (!) 122/58 (!) 115/53 (!) 127/52   Pulse: 82 69 72   Resp: 16 17 18    Temp: 98.3 F (36.8 C) 99 F (37.2 C) (!) 100.4 F (38 C) 100.2 F (37.9 C)  TempSrc: Oral Oral Oral   SpO2: 100% 100% 100%   Weight:      Height:        Intake/Output Summary (Last 24 hours) at 01/18/2022 0604 Last data filed at 01/18/2022 0402 Gross per 24 hour  Intake 2413.85 ml  Output 250 ml  Net 2163.85 ml      01/17/2022   10:18 AM 01/09/2022    1:35 PM 12/15/2021   10:12 AM  Last 3 Weights  Weight (lbs) 152 lb 152 lb 175 lb  Weight (kg) 68.947 kg 68.947 kg 79.379 kg      Telemetry    No sustained VT or NSVT. - Personally Reviewed  ECG    Personally Reviewed  Physical Exam   GEN: No acute distress.   Cardiac: Warm. PPM pocket well healed. Psych: Normal affect   Labs    High Sensitivity Troponin:   Recent Labs  Lab 01/17/22 0942 01/17/22 1141  TROPONINIHS 103* 99*     Chemistry Recent Labs  Lab 01/17/22 0708 01/17/22 0942  NA 134*  --   K 4.4  --   CL 98  --   CO2 23  --   GLUCOSE 176*  --   BUN 51*  --   CREATININE 2.49*  --   CALCIUM 9.0  --   MG  --  1.8  GFRNONAA 24*  --   ANIONGAP 13  --     Lipids No results for  input(s): "CHOL", "TRIG", "HDL", "LABVLDL", "LDLCALC", "CHOLHDL" in the last 168 hours.  Hematology Recent Labs  Lab 01/17/22 0708 01/17/22 1830 01/17/22 2345  WBC 9.5  --   --   RBC 2.47*  --   --   HGB 7.7* 8.1* 8.6*  HCT 23.9* 24.4* 25.9*  MCV 96.8  --   --   MCH 31.2  --   --   MCHC 32.2  --   --   RDW 15.4  --   --   PLT 107*  --   --    Thyroid No results for input(s): "TSH", "FREET4" in the last 168 hours.  BNPNo results for input(s): "BNP", "PROBNP" in the last 168 hours.  DDimer No results for input(s): "DDIMER" in the last 168 hours.   Radiology    CT HEAD WO CONTRAST  Result Date: 01/17/2022 CLINICAL DATA:  Polytrauma, blunt. EXAM: CT HEAD WITHOUT CONTRAST CT CERVICAL SPINE WITHOUT CONTRAST TECHNIQUE: Multidetector CT imaging of the head  and cervical spine was performed following the standard protocol without intravenous contrast. Multiplanar CT image reconstructions of the cervical spine were also generated. RADIATION DOSE REDUCTION: This exam was performed according to the departmental dose-optimization program which includes automated exposure control, adjustment of the mA and/or kV according to patient size and/or use of iterative reconstruction technique. COMPARISON:  None Available. FINDINGS: CT HEAD FINDINGS Brain: Generalized age related parenchymal volume loss with commensurate dilatation of the ventricles and sulci. Mild chronic small vessel ischemic changes within the bilateral periventricular and subcortical white matter regions. No mass, hemorrhage, edema or other evidence of acute parenchymal abnormality. No extra-axial hemorrhage. Vascular: Chronic calcified atherosclerotic changes of the large vessels at the skull base. No unexpected hyperdense vessel. Skull: Normal. Negative for fracture or focal lesion. Sinuses/Orbits: No acute finding. Other: Scalp defect overlying the LEFT frontal bone, with probable exposure of the frontal bone. No underlying fracture is seen.  CT CERVICAL SPINE FINDINGS Alignment: Levoscoliosis. No evidence of acute vertebral body subluxation. Skull base and vertebrae: No fracture line or displaced fracture fragment is seen. Facet joints appear normally aligned. Soft tissues and spinal canal: No prevertebral fluid or swelling. No visible canal hematoma. Disc levels: Degenerative spondylosis throughout the cervical spine, at least moderate in degree causing mild to moderate central canal stenoses at the C4-5 through C7-T1 levels. Additional advanced degenerative hypertrophic changes of the facet and uncovertebral joints resulting in moderate to severe neural foramen stenoses at C3-4, C4-5, C5-6 and C6-7 with possible associated nerve root impingements. Upper chest: No acute findings. Emphysematous changes at the lung apices with associated pleural-parenchymal scarring/fibrosis. Other: Bilateral carotid atherosclerosis. IMPRESSION: 1. Scalp defect overlying the LEFT frontal bone, with probable exposure of the frontal bone. No underlying skull fracture is seen. 2. No acute intracranial abnormality. No intracranial mass, hemorrhage or edema. 3. No fracture or acute subluxation within the cervical spine. 4. Degenerative changes of the cervical spine, as detailed above. 5. Carotid atherosclerosis. Electronically Signed   By: Bary Richard M.D.   On: 01/17/2022 09:02   CT Cervical Spine Wo Contrast  Result Date: 01/17/2022 CLINICAL DATA:  Polytrauma, blunt. EXAM: CT HEAD WITHOUT CONTRAST CT CERVICAL SPINE WITHOUT CONTRAST TECHNIQUE: Multidetector CT imaging of the head and cervical spine was performed following the standard protocol without intravenous contrast. Multiplanar CT image reconstructions of the cervical spine were also generated. RADIATION DOSE REDUCTION: This exam was performed according to the departmental dose-optimization program which includes automated exposure control, adjustment of the mA and/or kV according to patient size and/or use of  iterative reconstruction technique. COMPARISON:  None Available. FINDINGS: CT HEAD FINDINGS Brain: Generalized age related parenchymal volume loss with commensurate dilatation of the ventricles and sulci. Mild chronic small vessel ischemic changes within the bilateral periventricular and subcortical white matter regions. No mass, hemorrhage, edema or other evidence of acute parenchymal abnormality. No extra-axial hemorrhage. Vascular: Chronic calcified atherosclerotic changes of the large vessels at the skull base. No unexpected hyperdense vessel. Skull: Normal. Negative for fracture or focal lesion. Sinuses/Orbits: No acute finding. Other: Scalp defect overlying the LEFT frontal bone, with probable exposure of the frontal bone. No underlying fracture is seen. CT CERVICAL SPINE FINDINGS Alignment: Levoscoliosis. No evidence of acute vertebral body subluxation. Skull base and vertebrae: No fracture line or displaced fracture fragment is seen. Facet joints appear normally aligned. Soft tissues and spinal canal: No prevertebral fluid or swelling. No visible canal hematoma. Disc levels: Degenerative spondylosis throughout the cervical spine, at least moderate in degree  causing mild to moderate central canal stenoses at the C4-5 through C7-T1 levels. Additional advanced degenerative hypertrophic changes of the facet and uncovertebral joints resulting in moderate to severe neural foramen stenoses at C3-4, C4-5, C5-6 and C6-7 with possible associated nerve root impingements. Upper chest: No acute findings. Emphysematous changes at the lung apices with associated pleural-parenchymal scarring/fibrosis. Other: Bilateral carotid atherosclerosis. IMPRESSION: 1. Scalp defect overlying the LEFT frontal bone, with probable exposure of the frontal bone. No underlying skull fracture is seen. 2. No acute intracranial abnormality. No intracranial mass, hemorrhage or edema. 3. No fracture or acute subluxation within the cervical spine.  4. Degenerative changes of the cervical spine, as detailed above. 5. Carotid atherosclerosis. Electronically Signed   By: Franki Cabot M.D.   On: 01/17/2022 09:02   DG Pelvis 1-2 Views  Result Date: 01/17/2022 CLINICAL DATA:  Fall EXAM: PELVIS - 1-2 VIEW COMPARISON:  None Available. FINDINGS: There is a possible nondisplaced left-sided sacral fracture in zone 2. There is moderate bilateral osteoarthritis. No other evidence of fracture in the pelvis on single frontal view. Partially visualized lower lumbar spine with prior vertebroplasty for compression fracture of L3. IMPRESSION: Possible nondisplaced left-sided sacral fracture in zone 2. Electronically Signed   By: Maurine Simmering M.D.   On: 01/17/2022 08:20   DG Lumbar Spine 2-3 Views  Result Date: 01/17/2022 CLINICAL DATA:  Fall EXAM: LUMBAR SPINE - 2-3 VIEW COMPARISON:  Lumbar spine radiograph 01/16/2022, CT 10/19/2021 FINDINGS: There are chronic compression fractures of L1 and L3 with prior vertebroplasty. There is a compression deformity of L2, new since October 2023, with up to 25% height loss.There is multilevel degenerative disc disease, moderate and severe at L4-L5. There is moderate lower lumbar predominant facet arthropathy. Unchanged slight bony retropulsion of the superior endplate of L1. Right-sided nephroureteral stent noted. IMPRESSION: Chronic compression fractures of L1 and L3 with prior vertebroplasty and no evidence of progressive height loss. Compression fracture of L2 with up to 25% height loss, new since October 2023. Electronically Signed   By: Maurine Simmering M.D.   On: 01/17/2022 08:17   DG Chest 1 View  Result Date: 01/17/2022 CLINICAL DATA:  Fall EXAM: CHEST  1 VIEW COMPARISON:  10/28/2021 FINDINGS: LEFT-sided pacer overlies stable cardiac silhouette. Scarring at the LEFT lung base. No effusion, infiltrate, pneumothorax. No fracture identified. IMPRESSION: No evidence of thoracic trauma. Electronically Signed   By: Suzy Bouchard  M.D.   On: 01/17/2022 08:12   DG Lumbar Spine Complete  Result Date: 01/16/2022 CLINICAL DATA:  Back pain. EXAM: LUMBAR SPINE - COMPLETE 5 VIEW COMPARISON:  None Available. FINDINGS: Compression deformities with vertebroplasties at L1 and L3. No spondylolisthesis. Osseous structures are osteopenic. Dense atheromatous calcification of the aorta is incidentally noted. There is a right ureteral stent. IMPRESSION: Osteopenia. L3 and L1 compression deformities status post vertebroplasty. Electronically Signed   By: Sammie Bench M.D.   On: 01/16/2022 13:05      Assessment & Plan    #Sustained VT No further episodes. Continue IV amiodarone to complete a full 24 hours then transition to oral amiodarone. Plan for Amiodarone 400mg  PO BID x 5 days, then 400mg  PO daily until EP follow up.  #CHB #PPM in situ Device functioning appropriately.  For questions or updates, please contact [place] Please consult [email or web address] for contact info under        Signed, Vickie Epley, MD  01/18/2022, 6:04 AM

## 2022-01-18 NOTE — Progress Notes (Signed)
   01/18/22 1140  Spiritual Encounters  Type of Visit Initial  Care provided to: Patient  Referral source Nurse (RN/NT/LPN)  Reason for visit Advance directives   Chaplain responded to a spiritual consult request for advanced directive education.  The patient, Jacob [name], wanted to complete the document as much as possible today which we did.  A chaplain will come back on Friday or Monday to complete, if he has his glasses.  He expects someone to bring them on Friday.   Valerie Roys [facility]  [phone]

## 2022-01-18 NOTE — Progress Notes (Addendum)
FMTS Brief Progress Note  S:Received page that patient intermittently tachypneic w/ fine crackles on auscultation. Went to see patient with doctors Joeseph Amor and [place]. Patient lying in bed, comfortably sleeping, easily awakened and notes he's having no difficulty/discomfort breathing.    O: BP (!) 115/53 (BP Location: Left Arm)   Pulse 69   Temp 99 F (37.2 C) (Oral)   Resp 17   Ht 5\' 6"  (1.676 m)   Wt 68.9 kg   SpO2 100%   BMI 24.53 kg/m   General: sleeping comfortably, NAD Resp: CTABL w/ transmitted upper airways sounds, no crackles or wheezing Card: RRR, NRMG Ext: No pitting edema  A/P: Mechanical Fall Patient here after mechanical fall. Patient is sleeping comfortably with no report's of dyspnea. Will decrease IVF to help decrease risk of becoming volume overloaded. No concern for volume overload or flash pulmonary edema at this time. Patient physical exam benign with transmitted upper airway sounds, and no crackles/wheezing. Patient also appears euvolemic on exam with no pitting edema.  -mIVF 100 cc/hr  Holley Bouche, MD 01/18/2022, 12:15 AM PGY-2, [facility] Night Resident  Please page [phone] with questions.

## 2022-01-18 NOTE — Assessment & Plan Note (Addendum)
Remains afebrile. Will continue antibx while here (per family preference), but they are aware that antibx will be stopped at dialysis. - s/p cefepime (1/17 - 1/18) - Cont meropenem (1/19 - )  - ID consulted, appreciate recommendations

## 2022-01-18 NOTE — TOC CAGE-AID Note (Signed)
Transition of Care [facility]) - CAGE-AID Screening   Patient Details  Name: Jacob [name] MRN: [id number] Date of Birth: 06-Mar-1928  [facility] CM/SW Contact:    Erin Sons, LCSW Phone Number: 01/18/2022, 11:27 AM   Clinical Narrative:  Pt reports he does not use alcohol or any other substances.   CAGE-AID Screening:    Have You Ever Felt You Ought to Cut Down on Your Drinking or Drug Use?: No Have People Annoyed You By Critizing Your Drinking Or Drug Use?: No Have You Felt Bad Or Guilty About Your Drinking Or Drug Use?: No Have You Ever Had a Drink or Used Drugs First Thing In The Morning to Steady Your Nerves or to Get Rid of a Hangover?: No CAGE-AID Score: 0  Substance Abuse Education Offered: No

## 2022-01-18 NOTE — Plan of Care (Signed)
  Problem: Education: Goal: Knowledge of General Education information will improve Description: Including pain rating scale, medication(s)/side effects and non-pharmacologic comfort measures Outcome: Progressing   Problem: Health Behavior/Discharge Planning: Goal: Ability to manage health-related needs will improve Outcome: Progressing   Problem: Clinical Measurements: Goal: Ability to maintain clinical measurements within normal limits will improve Outcome: Progressing Goal: Will remain free from infection Outcome: Progressing Goal: Diagnostic test results will improve Outcome: Progressing Goal: Respiratory complications will improve Outcome: Progressing Goal: Cardiovascular complication will be avoided Outcome: Progressing   Problem: Coping: Goal: Level of anxiety will decrease Outcome: Progressing   Problem: Safety: Goal: Ability to remain free from injury will improve Outcome: Progressing

## 2022-01-18 NOTE — NC FL2 (Signed)
[place] MEDICAID FL2 LEVEL OF CARE FORM     IDENTIFICATION  Patient Name: Jacob [name] Birthdate: Jul 07, 1928 Sex: male Admission Date (Current Location): 01/17/2022  [facility] [place] Number:      Facility and Address:         Provider Number:    Attending Physician Name and Address:  Carney Living, MD  Relative Name and Phone Number:       Current Level of Care:   Recommended Level of Care:   Prior Approval Number:    Date Approved/Denied:   PASRR Number:    Discharge Plan:      Current Diagnoses: Patient Active Problem List   Diagnosis Date Noted   Recent unexplained fever 01/18/2022   Fall 01/17/2022   Head injury due to trauma 01/17/2022   V tach (HCC) 01/17/2022   Lumbar compression fracture (HCC) 01/17/2022   Orthostatic hypotension 10/28/2021   Lumbar radiculopathy 10/20/2021   Inadequate pain control 10/18/2021   Heart failure with preserved ejection fraction (HCC) 10/18/2021   Dysuria 10/18/2021   AKI (acute kidney injury) (HCC) 01/30/2021   CKD stage 4 secondary to hypertension (HCC) 01/26/2021   Closed compression fracture of L4 lumbar vertebra, initial encounter (HCC) 01/26/2021   Hyperkalemia 01/26/2021   Back pain 01/25/2021   Type 2 diabetes mellitus without complication, without long-term current use of insulin (HCC) 08/29/2017   History of bladder cancer 05/09/2016   Nodule of left lung 05/07/2016   COPD (chronic obstructive pulmonary disease) (HCC) 03/28/2015   Bladder carcinoma (HCC) 03/28/2015   Acute and chronic respiratory failure with hypoxia (HCC) 03/28/2015   GERD (gastroesophageal reflux disease) 03/28/2015   Pacemaker 12/01/2013   Mobitz type 2 second degree AV block 12/01/2013   Acquired solitary kidney 10/27/2013   Stage 3a chronic kidney disease (CKD) (HCC) 10/27/2013   Personal history of renal cell cancer 10/27/2013   Bradycardia 08/28/2013   Symptomatic bradycardia 08/28/2013   Sinus bradycardia     Renal insufficiency    HTN (hypertension)    Renal cancer, left (HCC)    Urothelial carcinoma of kidney (HCC) 08/22/2013   Left bundle branch block 02/14/2013   CAD (coronary artery disease)    Hyperlipidemia     Orientation RESPIRATION BLADDER Height & Weight            Weight: 152 lb (68.9 kg) Height:  5\' 6"  (167.6 cm)  BEHAVIORAL SYMPTOMS/MOOD NEUROLOGICAL BOWEL NUTRITION STATUS           AMBULATORY STATUS COMMUNICATION OF NEEDS Skin                               Personal Care Assistance Level of Assistance              Functional Limitations Info             SPECIAL CARE FACTORS FREQUENCY                       Contractures      Additional Factors Info                  Current Medications (01/18/2022):  This is the current hospital active medication list Current Facility-Administered Medications  Medication Dose Route Frequency Provider Last Rate Last Admin   acetaminophen (TYLENOL) tablet 650 mg  650 mg Oral Q6H PRN 01/20/2022, MD   650 mg at 01/18/22 1007  Or   acetaminophen (TYLENOL) suppository 650 mg  650 mg Rectal Q6H PRN Jena Gauss, Allee, MD       albuterol (PROVENTIL) (2.5 MG/3ML) 0.083% nebulizer solution 2.5 mg  2.5 mg Inhalation Q4H PRN Alfredo Martinez, MD       amiodarone (PACERONE) tablet 400 mg  400 mg Oral BID Graciella Freer, PA-C   400 mg at 01/18/22 1408   atorvastatin (LIPITOR) tablet 40 mg  40 mg Oral Daily Maxwell, Allee, MD   40 mg at 01/18/22 0957   enoxaparin (LOVENOX) injection 30 mg  30 mg Subcutaneous Q24H Paige, Victoria J, DO   30 mg at 01/18/22 1223   HYDROmorphone (DILAUDID) injection 1 mg  1 mg Intravenous Q4H PRN Bess Kinds, MD       lactated ringers infusion   Intravenous Continuous Ganta, Anupa, DO 100 mL/hr at 01/18/22 0636 Infusion Verify at 01/18/22 0636   lactose free nutrition (BOOST PLUS) liquid 237 mL  237 mL Oral TID WC Chambliss, Marshall L, MD       latanoprost (XALATAN) 0.005 %  ophthalmic solution 1 drop  1 drop Both Eyes QHS Maxwell, Allee, MD       mometasone-formoterol (DULERA) 200-5 MCG/ACT inhaler 2 puff  2 puff Inhalation BID Carney Living, MD   2 puff at 01/18/22 0830   And   umeclidinium bromide (INCRUSE ELLIPTA) 62.5 MCG/ACT 1 puff  1 puff Inhalation Daily Carney Living, MD   1 puff at 01/18/22 0830   multivitamin with minerals tablet 1 tablet  1 tablet Oral Daily Carney Living, MD   1 tablet at 01/18/22 1409   pantoprazole (PROTONIX) EC tablet 40 mg  40 mg Oral Daily Alfredo Martinez, MD   40 mg at 01/18/22 0957   senna-docusate (Senokot-S) tablet 1 tablet  1 tablet Oral QHS PRN Alfredo Martinez, MD         Discharge Medications: Please see discharge summary for a list of discharge medications.  Relevant Imaging Results:  Relevant Lab Results:   Additional Information    Tory Emerald, LCSW

## 2022-01-18 NOTE — Evaluation (Signed)
Occupational Therapy Evaluation Patient Details Name: Jacob [name] MRN: [id number] DOB: 1928/12/01 Today's Date: 01/18/2022   History of Present Illness 89 y.o. male presenting 01/17/22 with fall without loss of consciousness but did hit head during the process. ?related to anemia, denied dizziness prior to fall. CT head without changes. L2 compression fx and left-sided sacral fx. Neurosurgery recommended TLSO. Recent removal skin cancer from forehead with open wound, now complicated by laceration from fall. Vtach with amiodorone started and soft BPs.  Past medical history significant for GERD, diverticulitis, HTN, HLD, renal cancer, and prostate cancer, previous lumbar fractures, COPD, LBBB, pacemaker.   Clinical Impression   Prior to this admission, patient living alone, driving, and completing IADLs in the community. Patient reports [facility] services would come out to the house to assist with showers, but otherwise could manage ADLs (patient endorses falls). Currently, patient presenting with decreased activity tolerance, lower BP sitting EOB, and need for increased asssit for all aspects of care. Patient mod A for bed mobility, mod A for ADLs and min-mod of 2 to stand and complete short shuffled steps to recliner. Due to current level of deficits, OT recommending SNF placement unless family can provide 24/7 assist. OT will continue to follow.      Recommendations for follow up therapy are one component of a multi-disciplinary discharge planning process, led by the attending physician.  Recommendations may be updated based on patient status, additional functional criteria and insurance authorization.   Follow Up Recommendations  Skilled nursing-short term rehab (<3 hours/day) (Unless family can provide 24/7 assist)     Assistance Recommended at Discharge Frequent or constant Supervision/Assistance  Patient can return home with the following A lot of help with walking and/or transfers;A lot of  help with bathing/dressing/bathroom;Assistance with cooking/housework;Direct supervision/assist for medications management;Direct supervision/assist for financial management;Help with stairs or ramp for entrance;Assist for transportation    Functional Status Assessment  Patient has had a recent decline in their functional status and demonstrates the ability to make significant improvements in function in a reasonable and predictable amount of time.  Equipment Recommendations  None recommended by OT (patient has DME needed)    Recommendations for Other Services       Precautions / Restrictions Precautions Precautions: Fall;Back Precaution Booklet Issued: No      Mobility Bed Mobility Overal bed mobility: Needs Assistance Bed Mobility: Rolling, Sidelying to Sit Rolling: Min assist (with rail) Sidelying to sit: Mod assist, HOB elevated (with rail)       General bed mobility comments: vc for technique; assist due to pain and weakness    Transfers Overall transfer level: Needs assistance Equipment used: 2 person hand held assist Transfers: Sit to/from Stand, Bed to chair/wheelchair/BSC Sit to Stand: Mod assist, +2 physical assistance     Step pivot transfers: Min assist, +2 physical assistance     General transfer comment: assist to translate forward over his feet (tends to lean posterior); shuffling steps with continued posterior lean      Balance Overall balance assessment: Needs assistance Sitting-balance support: No upper extremity supported, Feet supported Sitting balance-Leahy Scale: Fair   Postural control: Posterior lean Standing balance support: Bilateral upper extremity supported Standing balance-Leahy Scale: Poor                             ADL either performed or assessed with clinical judgement   ADL Overall ADL's : Needs assistance/impaired Eating/Feeding: Set up;Sitting   Grooming:  Set up;Sitting   Upper Body Bathing: Minimal  assistance;Sitting   Lower Body Bathing: Moderate assistance;Sitting/lateral leans   Upper Body Dressing : Minimal assistance;Sitting   Lower Body Dressing: Maximal assistance;Sit to/from stand;Sitting/lateral leans   Toilet Transfer: Moderate assistance;+2 for physical assistance;+2 for safety/equipment;Stand-pivot   Toileting- Clothing Manipulation and Hygiene: Moderate assistance;Sitting/lateral lean;Sit to/from stand       Functional mobility during ADLs: Moderate assistance;Cueing for safety;Cueing for sequencing General ADL Comments: Patient presenting with decreased activity tolerance, lower BP sitting EOB, and need for increased asssit for all aspects of care.     Vision Baseline Vision/History: 1 Wears glasses Ability to See in Adequate Light: 0 Adequate Patient Visual Report: No change from baseline       Perception     Praxis      Pertinent Vitals/Pain Pain Assessment Pain Assessment: 0-10 Pain Score: 7  Pain Location: low back Pain Descriptors / Indicators: Guarding, Grimacing, Moaning     Hand Dominance     Extremity/Trunk Assessment Upper Extremity Assessment Upper Extremity Assessment: Generalized weakness   Lower Extremity Assessment Lower Extremity Assessment: Defer to PT evaluation   Cervical / Trunk Assessment Cervical / Trunk Assessment: Other exceptions Cervical / Trunk Exceptions: in TLSO due to lumbar and sacral fxs   Communication Communication Communication: No difficulties   Cognition Arousal/Alertness: Awake/alert Behavior During Therapy: Flat affect Overall Cognitive Status: No family/caregiver present to determine baseline cognitive functioning                                 General Comments: oriented to person, place, situation;     General Comments  BP with initial drop from 134/73 to 97/54 (with supine to sit with +dizziness) but then increased to 111/46 and ultimately 121/70    Exercises     Shoulder  Instructions      Home Living Family/patient expects to be discharged to:: Private residence Living Arrangements: Alone Available Help at Discharge: Family;Available 24 hours/day (daughter may come to stay with him per pt) Type of Home: House Home Access: Stairs to enter [employer] of Steps: 3 Entrance Stairs-Rails: Right Home Layout: One level     Bathroom Shower/Tub: Chief Strategy Officer: Handicapped height     Home Equipment: Grab bars - tub/shower;Cane - quad;Tub bench;Toilet riser;Rollator (4 wheels);Hand held shower head;Rolling Walker (2 wheels)   Additional Comments: some equipment from chart review and some from pt      Prior Functioning/Environment Prior Level of Function : Independent/Modified Independent             Mobility Comments: At baseline, pt is independent with quad cane for functional mobility. Denies falls other than this one ADLs Comments: independent in ADLs. drives, eats out regulalry, does his own grocery shopping        OT Problem List: Decreased strength;Decreased range of motion;Decreased activity tolerance;Impaired balance (sitting and/or standing);Decreased coordination;Decreased safety awareness;Decreased knowledge of use of DME or AE;Decreased knowledge of precautions;Pain      OT Treatment/Interventions: Self-care/ADL training;Neuromuscular education;Therapeutic exercise;Energy conservation;DME and/or AE instruction;Manual therapy;Therapeutic activities;Patient/family education;Balance training    OT Goals(Current goals can be found in the care plan section) Acute Rehab OT Goals Patient Stated Goal: to eat some breakfast OT Goal Formulation: With patient Time For Goal Achievement: 02/01/22 Potential to Achieve Goals: Fair ADL Goals Pt Will Perform Lower Body Bathing: with min assist;sit to/from stand;sitting/lateral leans Pt Will Perform Lower Body  Dressing: with min assist;sitting/lateral leans;sit to/from  stand Pt Will Transfer to Toilet: with min assist;ambulating;grab bars Pt Will Perform Toileting - Clothing Manipulation and hygiene: with min assist;sitting/lateral leans;sit to/from stand Additional ADL Goal #1: Patient will be able to complete functional task in standing for 2 minutes without needing a seated rest break to promote ADL independence.  OT Frequency: Min 2X/week    Co-evaluation              AM-PAC OT "6 Clicks" Daily Activity     Outcome Measure Help from another person eating meals?: None Help from another person taking care of personal grooming?: None Help from another person toileting, which includes using toliet, bedpan, or urinal?: A Lot Help from another person bathing (including washing, rinsing, drying)?: A Lot Help from another person to put on and taking off regular upper body clothing?: A Little Help from another person to put on and taking off regular lower body clothing?: A Lot 6 Click Score: 17   End of Session Equipment Utilized During Treatment: Gait belt Nurse Communication: Mobility status  Activity Tolerance: Patient limited by fatigue Patient left: in chair;with call bell/phone within reach;with chair alarm set  OT Visit Diagnosis: Unsteadiness on feet (R26.81);Other abnormalities of gait and mobility (R26.89);Repeated falls (R29.6);Muscle weakness (generalized) (M62.81);History of falling (Z91.81);Pain Pain - part of body:  (back)                Time: 4765-4650 OT Time Calculation (min): 28 min Charges:  OT General Charges $OT Visit: 1 Visit OT Evaluation $OT Eval Moderate Complexity: 1 Mod  Pollyann Glen E. Vittoria Noreen, OTR/L Acute Rehabilitation Services [phone]   Cherlyn Cushing 01/18/2022, 1:14 PM

## 2022-01-18 NOTE — Evaluation (Signed)
Physical Therapy Evaluation Patient Details Name: Jacob [name] MRN: [id number] DOB: [birth year] Today's Date: 01/18/2022  History of Present Illness  89 y.o. male presenting 01/17/22 with fall without loss of consciousness but did hit head during the process. ?related to anemia, denied dizziness prior to fall. CT head without changes. L2 compression fx and left-sided sacral fx. Neurosurgery recommended TLSO. Recent removal skin cancer from forehead with open wound, now complicated by laceration from fall. Vtach with amiodorone started and soft BPs.  Past medical history significant for GERD, diverticulitis, HTN, HLD, renal cancer, and prostate cancer, previous lumbar fractures, COPD, LBBB, pacemaker.  Clinical Impression   Pt admitted secondary to problem above with deficits below. PTA patient was modified independent using quad cane and living alone. Drives and does own grocery shopping.  Pt currently requires +2 mod assist for sit to stand and pivot to chair. Limited by pain and posterior imbalance. Recommend SNF for continued therapies, however pt is looking into having his daughter stay with him and provide 24/7 care. Anticipate patient will benefit from PT to address problems listed below.Will continue to follow acutely to maximize functional mobility independence and safety.          Recommendations for follow up therapy are one component of a multi-disciplinary discharge planning process, led by the attending physician.  Recommendations may be updated based on patient status, additional functional criteria and insurance authorization.  Follow Up Recommendations Skilled nursing-short term rehab (<3 hours/day) (unless daughter can provide 24/7 min assist) Can patient physically be transported by private vehicle: No    Assistance Recommended at Discharge Frequent or constant Supervision/Assistance  Patient can return home with the following  A little help with walking and/or  transfers;Assist for transportation;Help with stairs or ramp for entrance    Equipment Recommendations None recommended by PT  Recommendations for Other Services       Functional Status Assessment Patient has had a recent decline in their functional status and demonstrates the ability to make significant improvements in function in a reasonable and predictable amount of time.     Precautions / Restrictions Precautions Precautions: Fall;Back Precaution Booklet Issued: No      Mobility  Bed Mobility Overal bed mobility: Needs Assistance Bed Mobility: Rolling, Sidelying to Sit Rolling: Min assist (with rail) Sidelying to sit: Mod assist, HOB elevated (with rail)       General bed mobility comments: vc for technique; assist due to pain and weakness    Transfers Overall transfer level: Needs assistance Equipment used: 2 person hand held assist Transfers: Sit to/from Stand, Bed to chair/wheelchair/BSC Sit to Stand: Mod assist, +2 physical assistance   Step pivot transfers: Min assist, +2 physical assistance       General transfer comment: assist to translate forward over his feet (tends to lean posterior); shuffling steps with continued posterior lean    Ambulation/Gait               General Gait Details: shuffling steps to chair only with +2 HHA; posterior lean  Stairs            Wheelchair Mobility    Modified Rankin (Stroke Patients Only)       Balance Overall balance assessment: Needs assistance Sitting-balance support: No upper extremity supported, Feet supported Sitting balance-Leahy Scale: Fair   Postural control: Posterior lean Standing balance support: Bilateral upper extremity supported Standing balance-Leahy Scale: Poor  Pertinent Vitals/Pain Pain Assessment Pain Assessment: 0-10 Pain Score: 7  Pain Location: low back Pain Descriptors / Indicators: Guarding, Grimacing, Moaning Pain  Intervention(s): Limited activity within patient's tolerance, Monitored during session, Patient requesting pain meds-RN notified, Repositioned    Home Living Family/patient expects to be discharged to:: Private residence Living Arrangements: Alone Available Help at Discharge: Family;Available 24 hours/day (daughter may come to stay with him per pt) Type of Home: House Home Access: Stairs to enter Entrance Stairs-Rails: Right Entrance Stairs-Number of Steps: 3   Home Layout: One level Home Equipment: Grab bars - tub/shower;Cane - quad;Tub bench;Toilet riser;Rollator (4 wheels);Hand held shower head;Rolling Walker (2 wheels) Additional Comments: some equipment from chart review and some from pt    Prior Function Prior Level of Function : Independent/Modified Independent             Mobility Comments: At baseline, pt is independent with quad cane for functional mobility. Denies falls other than this one ADLs Comments: independent in ADLs. drives, eats out regulalry, does his own grocery shopping     Hand Dominance        Extremity/Trunk Assessment   Upper Extremity Assessment Upper Extremity Assessment: Defer to OT evaluation    Lower Extremity Assessment Lower Extremity Assessment: Generalized weakness    Cervical / Trunk Assessment Cervical / Trunk Assessment: Other exceptions Cervical / Trunk Exceptions: in TLSO due to lumbar and sacral fxs  Communication   Communication: No difficulties  Cognition Arousal/Alertness: Awake/alert Behavior During Therapy: Flat affect Overall Cognitive Status: No family/caregiver present to determine baseline cognitive functioning                                 General Comments: oriented to person, place, situation;        General Comments General comments (skin integrity, edema, etc.): BP with initial drop from 134/73 to 97/54 (with supine to sit with +dizziness) but then increased to 111/46 and ultimately  121/70    Exercises     Assessment/Plan    PT Assessment Patient needs continued PT services  PT Problem List Decreased strength;Decreased activity tolerance;Decreased balance;Decreased mobility;Decreased knowledge of use of DME;Decreased knowledge of precautions;Pain       PT Treatment Interventions DME instruction;Gait training;Stair training;Functional mobility training;Therapeutic activities;Balance training;Patient/family education    PT Goals (Current goals can be found in the Care Plan section)  Acute Rehab PT Goals Patient Stated Goal: return home but will go to SNF if needed PT Goal Formulation: With patient Time For Goal Achievement: 02/01/22 Potential to Achieve Goals: Good    Frequency Min 3X/week     Co-evaluation               AM-PAC PT "6 Clicks" Mobility  Outcome Measure Help needed turning from your back to your side while in a flat bed without using bedrails?: A Lot Help needed moving from lying on your back to sitting on the side of a flat bed without using bedrails?: A Lot Help needed moving to and from a bed to a chair (including a wheelchair)?: Total Help needed standing up from a chair using your arms (e.g., wheelchair or bedside chair)?: Total Help needed to walk in hospital room?: Total Help needed climbing 3-5 steps with a railing? : Total 6 Click Score: 8    End of Session Equipment Utilized During Treatment: Gait belt;Back brace Activity Tolerance: Patient limited by pain Patient left: in chair;with call  bell/phone within reach;with chair alarm set Nurse Communication: Mobility status PT Visit Diagnosis: Unsteadiness on feet (R26.81);History of falling (Z91.81)    Time: 1894-8539 PT Time Calculation (min) (ACUTE ONLY): 23 min   Charges:   PT Evaluation $PT Eval Low Complexity: 1 Low           Jerolyn Center, PT Acute Rehabilitation Services  Office [phone]   Zena Amos 01/18/2022, 10:06 AM

## 2022-01-19 ENCOUNTER — Encounter (HOSPITAL_COMMUNITY): Payer: Self-pay | Admitting: Family Medicine

## 2022-01-19 ENCOUNTER — Inpatient Hospital Stay (HOSPITAL_COMMUNITY): Payer: Medicare Other

## 2022-01-19 DIAGNOSIS — G8929 Other chronic pain: Secondary | ICD-10-CM | POA: Diagnosis not present

## 2022-01-19 DIAGNOSIS — D696 Thrombocytopenia, unspecified: Secondary | ICD-10-CM | POA: Diagnosis present

## 2022-01-19 DIAGNOSIS — M545 Low back pain, unspecified: Secondary | ICD-10-CM

## 2022-01-19 DIAGNOSIS — B965 Pseudomonas (aeruginosa) (mallei) (pseudomallei) as the cause of diseases classified elsewhere: Secondary | ICD-10-CM

## 2022-01-19 DIAGNOSIS — R7881 Bacteremia: Secondary | ICD-10-CM

## 2022-01-19 DIAGNOSIS — E43 Unspecified severe protein-calorie malnutrition: Secondary | ICD-10-CM | POA: Diagnosis present

## 2022-01-19 LAB — GLUCOSE, CAPILLARY
Glucose-Capillary: 136 mg/dL — ABNORMAL HIGH (ref 70–99)
Glucose-Capillary: 135 mg/dL — ABNORMAL HIGH (ref 70–99)
Glucose-Capillary: 150 mg/dL — ABNORMAL HIGH (ref 70–99)
Glucose-Capillary: 177 mg/dL — ABNORMAL HIGH (ref 70–99)
Glucose-Capillary: 164 mg/dL — ABNORMAL HIGH (ref 70–99)

## 2022-01-19 LAB — BLOOD CULTURE ID PANEL (REFLEXED) - BCID2
A.calcoaceticus-baumannii: NOT DETECTED
Bacteroides fragilis: NOT DETECTED
CTX-M ESBL: NOT DETECTED
Candida albicans: NOT DETECTED
Candida auris: NOT DETECTED
Candida glabrata: NOT DETECTED
Candida krusei: NOT DETECTED
Candida parapsilosis: NOT DETECTED
Candida tropicalis: NOT DETECTED
Carbapenem resistance IMP: NOT DETECTED
Carbapenem resistance KPC: NOT DETECTED
Carbapenem resistance NDM: NOT DETECTED
Carbapenem resistance VIM: NOT DETECTED
Cryptococcus neoformans/gattii: NOT DETECTED
Enterobacter cloacae complex: NOT DETECTED
Enterobacterales: NOT DETECTED
Enterococcus Faecium: NOT DETECTED
Enterococcus faecalis: NOT DETECTED
Escherichia coli: NOT DETECTED
Haemophilus influenzae: NOT DETECTED
Klebsiella aerogenes: NOT DETECTED
Klebsiella oxytoca: NOT DETECTED
Klebsiella pneumoniae: NOT DETECTED
Listeria monocytogenes: NOT DETECTED
Methicillin resistance mecA/C: NOT DETECTED
Neisseria meningitidis: NOT DETECTED
Proteus species: NOT DETECTED
Pseudomonas aeruginosa: DETECTED — AB
Salmonella species: NOT DETECTED
Serratia marcescens: NOT DETECTED
Staphylococcus aureus (BCID): NOT DETECTED
Staphylococcus epidermidis: DETECTED — AB
Staphylococcus lugdunensis: NOT DETECTED
Staphylococcus species: DETECTED — AB
Stenotrophomonas maltophilia: NOT DETECTED
Streptococcus agalactiae: NOT DETECTED
Streptococcus pneumoniae: NOT DETECTED
Streptococcus pyogenes: NOT DETECTED
Streptococcus species: NOT DETECTED

## 2022-01-19 LAB — CBC
HCT: 23.8 % — ABNORMAL LOW (ref 39.0–52.0)
Hemoglobin: 8 g/dL — ABNORMAL LOW (ref 13.0–17.0)
MCH: 30.8 pg (ref 26.0–34.0)
MCHC: 33.6 g/dL (ref 30.0–36.0)
MCV: 91.5 fL (ref 80.0–100.0)
Platelets: 59 10*3/uL — ABNORMAL LOW (ref 150–400)
RBC: 2.6 MIL/uL — ABNORMAL LOW (ref 4.22–5.81)
RDW: 16.4 % — ABNORMAL HIGH (ref 11.5–15.5)
WBC: 6.6 10*3/uL (ref 4.0–10.5)
nRBC: 0 % (ref 0.0–0.2)

## 2022-01-19 LAB — ECHOCARDIOGRAM COMPLETE
Area-P 1/2: 3.39 cm2
Height: 66 in
MV M vel: 5.14 m/s
MV Peak grad: 105.7 mmHg
MV VTI: 1.53 cm2
Radius: 0.5 cm
S' Lateral: 2.7 cm
Weight: 2432 oz

## 2022-01-19 LAB — BASIC METABOLIC PANEL
Anion gap: 9 (ref 5–15)
BUN: 70 mg/dL — ABNORMAL HIGH (ref 8–23)
CO2: 22 mmol/L (ref 22–32)
Calcium: 7.9 mg/dL — ABNORMAL LOW (ref 8.9–10.3)
Chloride: 102 mmol/L (ref 98–111)
Creatinine, Ser: 3.19 mg/dL — ABNORMAL HIGH (ref 0.61–1.24)
GFR, Estimated: 18 mL/min — ABNORMAL LOW (ref >60)
Glucose, Bld: 141 mg/dL — ABNORMAL HIGH (ref 70–99)
Potassium: 4 mmol/L (ref 3.5–5.1)
Sodium: 133 mmol/L — ABNORMAL LOW (ref 135–145)

## 2022-01-19 MED ORDER — ACETAMINOPHEN 325 MG PO TABS
650.0000 mg | ORAL_TABLET | Freq: Four times a day (QID) | ORAL | Status: DC
  Administered 2022-01-19 – 2022-01-21 (×8): 650 mg via ORAL
  Filled 2022-01-19 (×8): qty 2

## 2022-01-19 MED ORDER — CALCITONIN (SALMON) 200 UNIT/ACT NA SOLN
1.0000 | Freq: Every day | NASAL | Status: DC
  Administered 2022-01-19 – 2022-01-23 (×5): 1 via NASAL
  Filled 2022-01-19: qty 3.7

## 2022-01-19 MED ORDER — HYDROMORPHONE HCL 2 MG PO TABS
2.0000 mg | ORAL_TABLET | ORAL | Status: DC | PRN
  Administered 2022-01-19: 2 mg via ORAL
  Filled 2022-01-19: qty 1

## 2022-01-19 MED ORDER — HYDROMORPHONE HCL 2 MG PO TABS
1.0000 mg | ORAL_TABLET | ORAL | Status: DC | PRN
  Administered 2022-01-19 – 2022-01-21 (×3): 1 mg via ORAL
  Filled 2022-01-19 (×3): qty 1

## 2022-01-19 MED ORDER — SODIUM CHLORIDE 0.9 % IV SOLN
2.0000 g | INTRAVENOUS | Status: DC
  Administered 2022-01-19: 2 g via INTRAVENOUS
  Filled 2022-01-19: qty 12.5

## 2022-01-19 MED ORDER — HYDROMORPHONE HCL 1 MG/ML IJ SOLN: 0.5000 mg | INTRAMUSCULAR | Status: DC | PRN

## 2022-01-19 MED ORDER — ACETAMINOPHEN 650 MG RE SUPP: 650.0000 mg | Freq: Four times a day (QID) | RECTAL | Status: DC

## 2022-01-19 MED ORDER — INSULIN ASPART 100 UNIT/ML IJ SOLN
0.0000 [IU] | INTRAMUSCULAR | Status: DC
  Administered 2022-01-20 – 2022-01-22 (×6): 1 [IU] via SUBCUTANEOUS

## 2022-01-19 NOTE — Progress Notes (Signed)
PHARMACY - PHYSICIAN COMMUNICATION CRITICAL VALUE ALERT - BLOOD CULTURE IDENTIFICATION (BCID)  Jacob [name] is an 89 y.o. male who presented to [name] on 01/17/2022 who presented after a fall and hit his head. PMH includes renal cell carcinoma s/p left nephrectomy, bladder tumor, skin cancer of the head, COPD, and advanced AV block s/p pacemaker.  Assessment:  BCID: 1/4 bottles growing staph epi and pseudomonas with blood cultures showing gram negative rods. Has remained afebrile since 1/17 where spiked possible fever of 100.4. WBC trending down from 8 to 6.6. Renal function is declining with CrCl of 13.6 ml/min (Cr 3.19)  Name of physician (or Provider) Contacted: Dr. Hyacinth Meeker, DO  Current antibiotics: None  Changes to prescribed antibiotics recommended:  Add cefepime 2g q 24h for a 7-14 day course Recommendations accepted by provider  Results for orders placed or performed during the hospital encounter of 01/17/22  Blood Culture ID Panel (Reflexed) (Collected: 01/18/2022  6:55 AM)  Result Value Ref Range   Enterococcus faecalis NOT DETECTED NOT DETECTED   Enterococcus Faecium NOT DETECTED NOT DETECTED   Listeria monocytogenes NOT DETECTED NOT DETECTED   Staphylococcus species DETECTED (A) NOT DETECTED   Staphylococcus aureus (BCID) NOT DETECTED NOT DETECTED   Staphylococcus epidermidis DETECTED (A) NOT DETECTED   Staphylococcus lugdunensis NOT DETECTED NOT DETECTED   Streptococcus species NOT DETECTED NOT DETECTED   Streptococcus agalactiae NOT DETECTED NOT DETECTED   Streptococcus pneumoniae NOT DETECTED NOT DETECTED   Streptococcus pyogenes NOT DETECTED NOT DETECTED   A.calcoaceticus-baumannii NOT DETECTED NOT DETECTED   Bacteroides fragilis NOT DETECTED NOT DETECTED   Enterobacterales NOT DETECTED NOT DETECTED   Enterobacter cloacae complex NOT DETECTED NOT DETECTED   Escherichia coli NOT DETECTED NOT DETECTED   Klebsiella aerogenes NOT DETECTED NOT DETECTED   Klebsiella  oxytoca NOT DETECTED NOT DETECTED   Klebsiella pneumoniae NOT DETECTED NOT DETECTED   Proteus species NOT DETECTED NOT DETECTED   Salmonella species NOT DETECTED NOT DETECTED   Serratia marcescens NOT DETECTED NOT DETECTED   Haemophilus influenzae NOT DETECTED NOT DETECTED   Neisseria meningitidis NOT DETECTED NOT DETECTED   Pseudomonas aeruginosa DETECTED (A) NOT DETECTED   Stenotrophomonas maltophilia NOT DETECTED NOT DETECTED   Candida albicans NOT DETECTED NOT DETECTED   Candida auris NOT DETECTED NOT DETECTED   Candida glabrata NOT DETECTED NOT DETECTED   Candida krusei NOT DETECTED NOT DETECTED   Candida parapsilosis NOT DETECTED NOT DETECTED   Candida tropicalis NOT DETECTED NOT DETECTED   Cryptococcus neoformans/gattii NOT DETECTED NOT DETECTED   CTX-M ESBL NOT DETECTED NOT DETECTED   Carbapenem resistance IMP NOT DETECTED NOT DETECTED   Carbapenem resistance KPC NOT DETECTED NOT DETECTED   Methicillin resistance mecA/C NOT DETECTED NOT DETECTED   Carbapenem resistance NDM NOT DETECTED NOT DETECTED   Carbapenem resistance VIM NOT DETECTED NOT DETECTED    Arabella Merles, PharmD. [name] Acute Care PGY-1  01/19/2022 7:46 AM

## 2022-01-19 NOTE — Progress Notes (Signed)
Pharmacy Antibiotic Note  Jacob [name] is a 89 y.o. male admitted on 01/17/2022 after a fall. Patient has PMH of renal cell carcinoma s/p left nephrectomy, bladder tumor, skin cancer of the head, COPD, and advanced AV block s/p pacemaker who presented after a fall. Patient had slight fever of 100.4 in the morning on 1/17. BCID has returned and is growing 1/4 bottles staph epi and pseudomonas. Pharmacy has been consulted for cefepime dosing. WBC downtrending from 8 to 6.6, has been afebrile since possible fever, and Cr increasing from 2.49>2.65>3.19 (bl~1.7) and CrCl 13.6 ml/min  Plan: Cefepime 2g every 24 hours Monitor kidney function, culture data, fever curve, and clinical signs of improvement  Height: 5\' 6"  (167.6 cm) Weight: 68.9 kg (152 lb) IBW/kg (Calculated) : 63.8  Temp (24hrs), Avg:98.7 F (37.1 C), Min:98.3 F (36.8 C), Max:99.1 F (37.3 C)  Recent Labs  Lab 01/17/22 0708 01/17/22 1739 01/17/22 2111 01/18/22 0652 01/19/22 0349  WBC 9.5  --   --  8.0 6.6  CREATININE 2.49*  --   --  2.65* 3.19*  LATICACIDVEN  --  1.1 1.6  --   --      Estimated Creatinine Clearance: 13.6 mL/min (A) (by C-G formula based on SCr of 3.19 mg/dL (H)).    Allergies  Allergen Reactions   Penicillins Hives    BLISTERS    Antimicrobials this admission: Cefepime 1/18>>  Microbiology results: 1/17 BCID: 1/4 staph epi and pseudomonas 1/17 UCx: sent   Thank you for allowing pharmacy to be a part of this patient's care.  2/17, PharmD. [facility] Acute Care PGY-1  01/19/2022 7:34 AM

## 2022-01-19 NOTE — Progress Notes (Signed)
Physical Therapy Treatment Patient Details Name: Jacob [name] MRN: [id number] DOB: May 15, 1928 Today's Date: 01/19/2022   History of Present Illness 89 y.o. male presenting 01/17/22 with fall without loss of consciousness but did hit head during the process. ?related to anemia, denied dizziness prior to fall. CT head without changes. L2 compression fx and left-sided sacral fx. Neurosurgery recommended TLSO. Recent removal skin cancer from forehead with open wound, now complicated by laceration from fall. Vtach with amiodorone started and soft BPs.  Past medical history significant for GERD, diverticulitis, HTN, HLD, renal cancer, and prostate cancer, previous lumbar fractures, COPD, LBBB, pacemaker.    PT Comments    Pt greeted semi-reclined in bed needing max encouragement for participation. Pt declining all OOB mobility despite education and encouragement. Pt able to roll in bed with use of bedrail and min assist to reposition for increased comfort. Pt with fair participation with LE therex. Current plan remains appropriate to address deficits and maximize functional independence and decrease caregiver burden. Pt continues to benefit from skilled PT services to progress toward functional mobility goals.    Recommendations for follow up therapy are one component of a multi-disciplinary discharge planning process, led by the attending physician.  Recommendations may be updated based on patient status, additional functional criteria and insurance authorization.  Follow Up Recommendations  Skilled nursing-short term rehab (<3 hours/day) (unless daughter can provide 24/7 min assist) Can patient physically be transported by private vehicle: No   Assistance Recommended at Discharge Frequent or constant Supervision/Assistance  Patient can return home with the following A little help with walking and/or transfers;Assist for transportation;Help with stairs or ramp for entrance   Equipment  Recommendations  None recommended by PT    Recommendations for Other Services       Precautions / Restrictions Precautions Precautions: Fall;Back Precaution Booklet Issued: No Restrictions Weight Bearing Restrictions: No     Mobility  Bed Mobility Overal bed mobility: Needs Assistance Bed Mobility: Rolling Rolling: Min assist (with rail)         General bed mobility comments: min asssit for cues to use rail    Transfers                   General transfer comment: pt declining all mobility    Ambulation/Gait                   Stairs             Wheelchair Mobility    Modified Rankin (Stroke Patients Only)       Balance Overall balance assessment: Needs assistance Sitting-balance support: No upper extremity supported, Feet supported Sitting balance-Leahy Scale: Fair Sitting balance - Comments: pt declining to come to EOB                                    Cognition Arousal/Alertness: Awake/alert Behavior During Therapy: Flat affect Overall Cognitive Status: No family/caregiver present to determine baseline cognitive functioning                                 General Comments: oriented to person, place, situation;        Exercises General Exercises - Lower Extremity Heel Slides: AROM, AAROM, Both, 5 reps, Supine Straight Leg Raises: AROM, AAROM, Both, 5 reps, Supine    General Comments General comments (skin  integrity, edema, etc.): pt declining all mobility despite max encouragement and eduation and      Pertinent Vitals/Pain Pain Assessment Pain Assessment: Faces Faces Pain Scale: Hurts worst Pain Descriptors / Indicators: Guarding, Grimacing, Moaning Pain Intervention(s): Monitored during session, Limited activity within patient's tolerance, Repositioned, Patient requesting pain meds-RN notified    Home Living                          Prior Function            PT Goals  (current goals can now be found in the care plan section) Acute Rehab PT Goals Patient Stated Goal: return home but will go to SNF if needed PT Goal Formulation: With patient Time For Goal Achievement: 02/01/22 Progress towards PT goals: Not progressing toward goals - comment (pain)    Frequency    Min 3X/week      PT Plan      Co-evaluation              AM-PAC PT "6 Clicks" Mobility   Outcome Measure  Help needed turning from your back to your side while in a flat bed without using bedrails?: A Lot Help needed moving from lying on your back to sitting on the side of a flat bed without using bedrails?: A Lot Help needed moving to and from a bed to a chair (including a wheelchair)?: Total Help needed standing up from a chair using your arms (e.g., wheelchair or bedside chair)?: Total Help needed to walk in hospital room?: Total Help needed climbing 3-5 steps with a railing? : Total 6 Click Score: 8    End of Session Equipment Utilized During Treatment: Back brace Activity Tolerance: Patient limited by pain Patient left: with call bell/phone within reach;in bed;with bed alarm set Nurse Communication: Mobility status;Patient requests pain meds PT Visit Diagnosis: Unsteadiness on feet (R26.81);History of falling (Z91.81)     Time: 6024-6492 PT Time Calculation (min) (ACUTE ONLY): 12 min  Charges:  $Therapeutic Activity: 8-22 mins                     Danella Philson R. PTA Acute Rehabilitation Services Office: [phone]    Catalina Antigua 01/19/2022, 1:13 PM

## 2022-01-19 NOTE — Consult Note (Signed)
[facility]    Date of Admission:  01/17/2022     Total days of antibiotics 1               Reason for Consult: Pseudomonas Bacteremia  Referring Provider: Dr. [name] Primary Care Provider: Sherrie Mustache, MD   ASSESSMENT:  [name] is a 89 y/o male with complete heart block s/p pacemaker placement 2015 and recent Mohs procedure who sustained a fall with injury at home and found to have compromised Mohs surgical site s/p repair in the ED; possibly new compression fracture of L2 with Neurosurgery recommending TLSO brace and follow up; sustained ventricular tachycardia now started on amiodarone; and positive blood cultures in 1/4 bottles with BCID identifying Staphylococcus epidermidis and Pseudomonas aeruginosa. Staphylococcus epidermidis is most likely a contaminant. Will need TEE to interrogate pacemaker for vegetation in the setting of Pseudomonas bacteremia and repeat blood cultures. Agree with Cefepime which has been started. Remaining medical and supportive care per Primary Team.   PLAN:  Continue current dose of Cefepime Repeat blood cultures TEE to check for device infection/endocarditis Ventricular tachycardia per Cardiology Remaining medical and supportive care per Primary Team.    Principal Problem:   Bacteremia due to Pseudomonas Active Problems:   CAD (coronary artery disease)   Hyperlipidemia   HTN (hypertension)   Renal cancer, left (HCC)   AKI (acute kidney injury) (HCC)   Fall   Head injury due to trauma   V tach (HCC)   Lumbar compression fracture (HCC)    amiodarone  400 mg Oral BID   atorvastatin  40 mg Oral Daily   enoxaparin (LOVENOX) injection  30 mg Subcutaneous Q24H   lactose free nutrition  237 mL Oral TID WC   latanoprost  1 drop Both Eyes QHS   mometasone-formoterol  2 puff Inhalation BID   And   umeclidinium bromide  1 puff Inhalation Daily   multivitamin with minerals  1 tablet Oral Daily    pantoprazole  40 mg Oral Daily     HPI: Jacob [name] is a 89 y.o. male with previous medical history of left bundle branch block s/p pacemaker placement (2015), recurrent bladder carcinoma, Type 2 diabetes, and renal insuffiencey presenting from home with mechanical fall and laceration to the forehead.   [name] experienced a fall when attempting to get off the toilet. Recently had dermatological procedure to remove cancerous tissue that was compromised and repaired in the ED. Complaining of lower back pain with recent kyphoplasty in November 2023. X-ray lumbar spine with osteopenia and L3 and L1 compression deformities s/p vertebroplasty. CT head scalp defect overlying frontal bone with probable bone exposure and no fracture.Chest x-ray without evidence of trauma. X-ray pelvis with possible displaced left sided sacral fracture. Pacemaker interrogated with findings of NSVT and EP consult with recommendations for starting amiodarone. Neurosurgery consulted for back fractures with recommendation for TLSO brace and follow up outpatient.  Mr. [name] had a max temperature of 100.2 F and no leukocytosis. Blood cultures drawn on 01/18/22 growing gram negative rods with BCID identifying Pseudomonas aeruginosa and Staphylococcus epidermidis in 1 out of 4 bottles.  Has now been started of cefepime.    Review of Systems: Review of Systems  Constitutional:  Negative for chills, fever and weight loss.  Respiratory:  Negative for cough, shortness of breath and wheezing.   Cardiovascular:  Negative for chest pain and leg swelling.  Gastrointestinal:  Negative for abdominal pain, constipation,  diarrhea, nausea and vomiting.  Skin:  Negative for rash.     Past Medical History:  Diagnosis Date   Anemia    Arthritis of both hands    BPH (benign prostatic hypertrophy)    COPD (chronic obstructive pulmonary disease) (HCC)    Coronary artery disease    a.) LHC 11/18/2004: 40% pLAD, 50% mLAD, 80% D1,  50% mLCx, 100% mRCA - refer to [facility] for PCI; b.) LHC/PCI 11/21/2004: 100% mRCA (3.5 x 23 mm Cypher DES), 95% dRCA (2.5 x 28 mm Cypher); b.) 05/03/12 Cardiac CT: patent RCA stents, nonobs dzs;  c.) 01/2013 Ex MV: nl EF, no ischemia.   Diastolic dysfunction    a.) TTE 08/29/2013: EF 60-65%, mild LVH, mild BAE, mild MR, mod TR, G1DD; b.) TTE 07/01/2020: EF 50-55%, LAEm triv MR, mild-mod Ao sclerosis with no stenosis, G2DD   Dyspnea    GERD (gastroesophageal reflux disease)    Glaucoma of both eyes    H/O hiatal hernia    History of atrial fibrillation 2006   History of diverticulitis    History of kidney stones    History of renal cell carcinoma    a.) s/p LEFT nephroureterectomy   Hyperlipidemia    Hypertension    LBBB (left bundle branch block)    Mobitz type 2 second degree AV block    a.) s/p PPM placement 08/29/2013   Pneumonia    Presence of permanent cardiac pacemaker    Recurrent bladder transitional cell carcinoma (HCC)    a.) s/p TURBT and intravesical chemotherapy   Renal insufficiency    a. Creat rose to 2.34 post-op L nephrectomy.   S/P placement of cardiac pacemaker 08/29/2013   a.) Medtroinc device placed for symptomatic bradycardia and mobitz 2 second degree heart block   Symptomatic bradycardia    a.) s/p PPM placement 08/29/2013   Type 2 diabetes mellitus without complication, without long-term current use of insulin (HCC) 08/29/2017    Social History   Tobacco Use   Smoking status: Former    Packs/day: 1.00    Years: 50.00    Total pack years: 50.00    Types: Cigarettes    Quit date: 11/02/2000    Years since quitting: 21.2   Smokeless tobacco: Never  Vaping Use   Vaping Use: Never used  Substance Use Topics   Alcohol use: No   Drug use: No    Family History  Problem Relation Age of Onset   Heart attack Mother    Heart attack Father    Prostate cancer Neg Hx    Bladder Cancer Neg Hx    Kidney cancer Neg Hx     Allergies  Allergen Reactions    Penicillins Hives    BLISTERS    OBJECTIVE: Blood pressure (!) 106/47, pulse 71, temperature 99 F (37.2 C), temperature source Axillary, resp. rate 20, height 5\' 6"  (1.676 m), weight 68.9 kg, SpO2 98 %.  Physical Exam Constitutional:      General: He is not in acute distress.    Appearance: He is well-developed.  Cardiovascular:     Rate and Rhythm: Normal rate and regular rhythm.     Heart sounds: Normal heart sounds.  Pulmonary:     Effort: Pulmonary effort is normal.     Breath sounds: Normal breath sounds.  Skin:    General: Skin is warm and dry.  Neurological:     Mental Status: He is alert and oriented to person, place, and time.  Lab Results Lab Results  Component Value Date   WBC 6.6 01/19/2022   HGB 8.0 (L) 01/19/2022   HCT 23.8 (L) 01/19/2022   MCV 91.5 01/19/2022   PLT 59 (L) 01/19/2022    Lab Results  Component Value Date   CREATININE 3.19 (H) 01/19/2022   BUN 70 (H) 01/19/2022   NA 133 (L) 01/19/2022   K 4.0 01/19/2022   CL 102 01/19/2022   CO2 22 01/19/2022    Lab Results  Component Value Date   ALT 21 01/25/2021   AST 30 01/25/2021   ALKPHOS 70 01/25/2021   BILITOT 0.8 01/25/2021     Microbiology: Recent Results (from the past 240 hour(s))  Urine Culture     Status: Abnormal (Preliminary result)   Collection Time: 01/18/22  4:23 AM   Specimen: Urine, Clean Catch  Result Value Ref Range Status   Specimen Description URINE, CLEAN CATCH  Final   Special Requests NONE  Final   Culture (A)  Final    >=100,000 COLONIES/mL GRAM NEGATIVE RODS SUSCEPTIBILITIES TO FOLLOW Performed at [facility], [address] [place], [place] [address]    Report Status PENDING  Incomplete  Culture, blood (Routine X 2) w Reflex to ID Panel     Status: None (Preliminary result)   Collection Time: 01/18/22  6:55 AM   Specimen: BLOOD RIGHT HAND  Result Value Ref Range Status   Specimen Description BLOOD RIGHT HAND  Final   Special Requests   Final     BOTTLES DRAWN AEROBIC AND ANAEROBIC Blood Culture adequate volume   Culture  Setup Time   Final    GRAM NEGATIVE RODS AEROBIC BOTTLE ONLY CRITICAL RESULT CALLED TO, READ BACK BY AND VERIFIED WITH: V BRYK,[id number]  01/19/22 [place] Performed at [facility], [place] [address], [place], [place] [address]    Culture GRAM NEGATIVE RODS  Final   Report Status PENDING  Incomplete  Blood Culture ID Panel (Reflexed)     Status: Abnormal   Collection Time: 01/18/22  6:55 AM  Result Value Ref Range Status   Enterococcus faecalis NOT DETECTED NOT DETECTED Final   Enterococcus Faecium NOT DETECTED NOT DETECTED Final   Listeria monocytogenes NOT DETECTED NOT DETECTED Final   Staphylococcus species DETECTED (A) NOT DETECTED Final    Comment: CRITICAL RESULT CALLED TO, READ BACK BY AND VERIFIED WITH: V BRYK,PHARMD@[id number]  01/19/22 [place]    Staphylococcus aureus (BCID) NOT DETECTED NOT DETECTED Final   Staphylococcus epidermidis DETECTED (A) NOT DETECTED Final    Comment: CRITICAL RESULT CALLED TO, READ BACK BY AND VERIFIED WITH: V BRYK,PHARMD@[id number]  01/19/22 [place]    Staphylococcus lugdunensis NOT DETECTED NOT DETECTED Final   Streptococcus species NOT DETECTED NOT DETECTED Final   Streptococcus agalactiae NOT DETECTED NOT DETECTED Final   Streptococcus pneumoniae NOT DETECTED NOT DETECTED Final   Streptococcus pyogenes NOT DETECTED NOT DETECTED Final   A.calcoaceticus-baumannii NOT DETECTED NOT DETECTED Final   Bacteroides fragilis NOT DETECTED NOT DETECTED Final   Enterobacterales NOT DETECTED NOT DETECTED Final   Enterobacter cloacae complex NOT DETECTED NOT DETECTED Final   Escherichia coli NOT DETECTED NOT DETECTED Final   Klebsiella aerogenes NOT DETECTED NOT DETECTED Final   Klebsiella oxytoca NOT DETECTED NOT DETECTED Final   Klebsiella pneumoniae NOT DETECTED NOT DETECTED Final   Proteus species NOT DETECTED NOT DETECTED Final   Salmonella species NOT DETECTED NOT DETECTED Final   Serratia  marcescens NOT DETECTED NOT DETECTED Final   Haemophilus influenzae  NOT DETECTED NOT DETECTED Final   Neisseria meningitidis NOT DETECTED NOT DETECTED Final   Pseudomonas aeruginosa DETECTED (A) NOT DETECTED Final    Comment: CRITICAL RESULT CALLED TO, READ BACK BY AND VERIFIED WITH: V BRYK,PHARMD@[id number]  01/19/22 [place]    Stenotrophomonas maltophilia NOT DETECTED NOT DETECTED Final   Candida albicans NOT DETECTED NOT DETECTED Final   Candida auris NOT DETECTED NOT DETECTED Final   Candida glabrata NOT DETECTED NOT DETECTED Final   Candida krusei NOT DETECTED NOT DETECTED Final   Candida parapsilosis NOT DETECTED NOT DETECTED Final   Candida tropicalis NOT DETECTED NOT DETECTED Final   Cryptococcus neoformans/gattii NOT DETECTED NOT DETECTED Final   CTX-M ESBL NOT DETECTED NOT DETECTED Final   Carbapenem resistance IMP NOT DETECTED NOT DETECTED Final   Carbapenem resistance KPC NOT DETECTED NOT DETECTED Final   Methicillin resistance mecA/C NOT DETECTED NOT DETECTED Final   Carbapenem resistance NDM NOT DETECTED NOT DETECTED Final   Carbapenem resistance VIM NOT DETECTED NOT DETECTED Final    Comment: Performed at [facility], [address] [place], [place] [address]     Marcos Eke, NP [facility]  01/19/2022  8:49 AM

## 2022-01-19 NOTE — Progress Notes (Signed)
Daily Progress Note Intern Pager: [phone]  Patient name: Jacob [name] Medical record number: [id number] Date of birth: 1928/09/21 Age: 89 y.o. Gender: male  Primary Care Provider: Sherrie Mustache, MD Consultants: NSGY, Cardiology, ENT Code Status: Full Code   [name]: Admitted  1/18: Blood cx 24 hr result: pseudomonas   Assessment and Plan:  Jacob [name] is a 89 y.o. male with PMHx renal cell carcinoma with recent reoccurrence, COPD with no O2 at baseline, hyperlipidemia, advanced AV block s/p DDD PPM 2015, NSVT, PVCs, excised infiltrative basal cell carcinoma on the face presenting with fall without loss of consciousness but did hit head during the process.    Blood cultures drawn on 1/17 positive for pseudomonas. Patient has been afebrile since 1/17 (fever documented to 100.4). Does not appear septic on exam. With known source will treat with 7 day course of IV antibiotics. Will also reach out to ID with patient's pacemaker device with the bacteremia. Avoiding PO flouroquinolones in the setting of acute fractures, elderly, and decreased mobility.   * Bacteremia due to Pseudomonas Febrile to T max of 100.4 on 1/17. Afebrile since. VS stable. Blood cx showing pseudomonas growth after 24 hours.  - start cefepime per pharmacy (1/18 - 1/24) - f/u urine cx  - monitor fever curve  - ID consult placed, appreciate recommendations   V tach (HCC) Sustained V. tach on device interrogation from Medtronic pacemaker.   - Amiodarone 400 mg p.o. twice daily for 5 days - Followed by amiodarone 40 mg p.o. daily until EP follow-up outpatient - Cardiology following, appreciate recommendations   Lumbar compression fracture (HCC) Hx kyphoplasty x2, last procedure November 2023.  XR L-spine new compression fracture of L2 with up to 25% height loss. Sacral XR: nondisplaced left-sided sacral fracture in zone 2. NSGY consulted in ED and recommended TLSO  brace and follow up with NSGY outpatient.  - TLSO brace in place  - NSGY follow up  - Ortho recs? For sacral fx   Head injury due to trauma Hx MOHs w/o full skin closure as local skin laxity was not sufficient for layered closure. CT head: exposure of the frontal bone, no fracture. - F/U dermatology outpatient  - continue with wound care and keeping clean and dry   Fall Ambulates w/ walker at baseline. Fall most likely from instability. CT scan without acute changes/neurologically intact.  - Fall precautions - PT/OT evaluate and treat  Renal cancer, left [facility]) Recent stent placed outpatient, Surgical pathology: high-grade noninvasive tumor both in the ureter and the bladder. Urology plans to begin intravesical BCG 6 weeks postop for a total of 6 weeks.  - Monitor ins and outs - Will need follow-up with urology outpatient - urine culture  AKI (acute kidney injury) (HCC) See history in RCC problem. AKI on admission 1.94>2.49>2.65. Continue monitoring, and hold nephrotoxic medications.  - Encourage hydration - Strict I/Os  - daily BMP   HTN (hypertension) Holding any anti-hypertensive's; normotensive now. Home Amlodipine 5mg  per chart review but not in med rec.     FEN/GI: regular diet PPx: Lovenox  Dispo:SNF  pending bed placement . Barriers include 7 day course IV antibiotics.   Subjective:  NAEO, patient is feeling well, reports his pain is 5/10.   Objective: Temp:  [98.3 F (36.8 C)-99.1 F (37.3 C)] 99 F (37.2 C) (01/18 0728) Pulse Rate:  [70-92] 71 (01/18 0457) Resp:  [18-21] 20 (01/18 0457) BP: (  97-124)/(47-92) 106/47 (01/18 0457) SpO2:  [92 %-100 %] 98 % (01/18 0457) FiO2 (%):  [21 %] 21 % (01/17 2018) Physical Exam: Chronically ill-appearing, no acute distress Cardio: Regular rate, regular paced rhythm, no murmurs on exam. Pulm: Clear, no wheezing, no crackles. No increased work of breathing Abdominal: bowel sounds present, soft, non-tender,  non-distended Extremities: no peripheral edema  Neuro: alert and oriented x3, speech normal in content, no facial asymmetry  Laboratory: Most recent CBC Lab Results  Component Value Date   WBC 6.6 01/19/2022   HGB 8.0 (L) 01/19/2022   HCT 23.8 (L) 01/19/2022   MCV 91.5 01/19/2022   PLT 59 (L) 01/19/2022   Most recent BMP    Latest Ref Rng & Units 01/19/2022    3:49 AM  BMP  Glucose 70 - 99 mg/dL 016   BUN 8 - 23 mg/dL 70   Creatinine 5.53 - 1.24 mg/dL 7.48   Sodium 270 - 786 mmol/L 133   Potassium 3.5 - 5.1 mmol/L 4.0   Chloride 98 - 111 mmol/L 102   CO2 22 - 32 mmol/L 22   Calcium 8.9 - 10.3 mg/dL 7.9    [name], DO 01/19/2022, 8:47 AM  PGY-1, [facility] FPTS Intern pager: [phone], text pages welcome Secure chat group [facility] Teaching Service

## 2022-01-19 NOTE — Progress Notes (Signed)
   01/19/22 0328  Vitals  Temp 98.3 F (36.8 C)  Temp Source Axillary  BP (!) 97/54  MAP (mmHg) 68  BP Location Left Arm  BP Method Automatic  Patient Position (if appropriate) Lying  Pulse Rate 70  Pulse Rate Source Monitor  Resp (!) 21  MEWS COLOR  MEWS Score Color Yellow  Oxygen Therapy  SpO2 97 %  MEWS Score  MEWS Temp 0  MEWS Systolic 1  MEWS Pulse 0  MEWS RR 1  MEWS LOC 0  MEWS Score 2

## 2022-01-19 NOTE — Progress Notes (Signed)
  No further sustained VT.   NSVT Couplets and Triplets noted.   Continue amiodaonre 400 mg BID x 5 more days, then 200 mg BID x 7 days, then 200 mg daily.   Noted 1/4 BCx with staph epi and pseudomonas.  UCx with GNR.   Will discuss if felt to be contaminant. May need ID involvement given PPM-in-situ. Will discuss with Dr. Lalla Brothers pending results.   Casimiro Needle [address]" [place], PA-C  01/19/2022 8:29 AM

## 2022-01-19 NOTE — Progress Notes (Signed)
  Echocardiogram 2D Echocardiogram has been performed.  Jacob [name] M 01/19/2022, 9:58 AM

## 2022-01-20 ENCOUNTER — Inpatient Hospital Stay (HOSPITAL_COMMUNITY): Payer: Medicare Other

## 2022-01-20 DIAGNOSIS — R7881 Bacteremia: Secondary | ICD-10-CM | POA: Diagnosis not present

## 2022-01-20 DIAGNOSIS — S32010D Wedge compression fracture of first lumbar vertebra, subsequent encounter for fracture with routine healing: Secondary | ICD-10-CM

## 2022-01-20 DIAGNOSIS — N189 Chronic kidney disease, unspecified: Secondary | ICD-10-CM

## 2022-01-20 DIAGNOSIS — Z950 Presence of cardiac pacemaker: Secondary | ICD-10-CM | POA: Diagnosis not present

## 2022-01-20 LAB — CBC
HCT: 23 % — ABNORMAL LOW (ref 39.0–52.0)
Hemoglobin: 7.7 g/dL — ABNORMAL LOW (ref 13.0–17.0)
MCH: 30.8 pg (ref 26.0–34.0)
MCHC: 33.5 g/dL (ref 30.0–36.0)
MCV: 92 fL (ref 80.0–100.0)
Platelets: 57 10*3/uL — ABNORMAL LOW (ref 150–400)
RBC: 2.5 MIL/uL — ABNORMAL LOW (ref 4.22–5.81)
RDW: 16.5 % — ABNORMAL HIGH (ref 11.5–15.5)
WBC: 9 10*3/uL (ref 4.0–10.5)
nRBC: 0.2 % (ref 0.0–0.2)

## 2022-01-20 LAB — URINE CULTURE: Culture: >=100000 — AB

## 2022-01-20 LAB — GLUCOSE, CAPILLARY
Glucose-Capillary: 140 mg/dL — ABNORMAL HIGH (ref 70–99)
Glucose-Capillary: 112 mg/dL — ABNORMAL HIGH (ref 70–99)
Glucose-Capillary: 110 mg/dL — ABNORMAL HIGH (ref 70–99)
Glucose-Capillary: 114 mg/dL — ABNORMAL HIGH (ref 70–99)
Glucose-Capillary: 139 mg/dL — ABNORMAL HIGH (ref 70–99)
Glucose-Capillary: 135 mg/dL — ABNORMAL HIGH (ref 70–99)
Glucose-Capillary: 155 mg/dL — ABNORMAL HIGH (ref 70–99)

## 2022-01-20 LAB — DIC (DISSEMINATED INTRAVASCULAR COAGULATION)PANEL
D-Dimer, Quant: 6.31 ug/mL-FEU — ABNORMAL HIGH (ref 0.00–0.50)
Fibrinogen: 741 mg/dL — ABNORMAL HIGH (ref 210–475)
INR: 1.3 — ABNORMAL HIGH (ref 0.8–1.2)
Platelets: 71 10*3/uL — ABNORMAL LOW (ref 150–400)
Prothrombin Time: 15.9 seconds — ABNORMAL HIGH (ref 11.4–15.2)
Smear Review: NONE SEEN
aPTT: 44 seconds — ABNORMAL HIGH (ref 24–36)

## 2022-01-20 LAB — BASIC METABOLIC PANEL
Anion gap: 11 (ref 5–15)
BUN: 82 mg/dL — ABNORMAL HIGH (ref 8–23)
CO2: 19 mmol/L — ABNORMAL LOW (ref 22–32)
Calcium: 7.9 mg/dL — ABNORMAL LOW (ref 8.9–10.3)
Chloride: 104 mmol/L (ref 98–111)
Creatinine, Ser: 4.02 mg/dL — ABNORMAL HIGH (ref 0.61–1.24)
GFR, Estimated: 13 mL/min — ABNORMAL LOW (ref >60)
Glucose, Bld: 116 mg/dL — ABNORMAL HIGH (ref 70–99)
Potassium: 4.5 mmol/L (ref 3.5–5.1)
Sodium: 134 mmol/L — ABNORMAL LOW (ref 135–145)

## 2022-01-20 LAB — SODIUM, URINE, RANDOM: Sodium, Ur: 35 mmol/L

## 2022-01-20 LAB — CREATININE, URINE, RANDOM: Creatinine, Urine: 82 mg/dL

## 2022-01-20 LAB — SEDIMENTATION RATE: Sed Rate: 137 mm/hr — ABNORMAL HIGH (ref 0–16)

## 2022-01-20 LAB — C-REACTIVE PROTEIN: CRP: 24.5 mg/dL — ABNORMAL HIGH (ref <1.0)

## 2022-01-20 MED ORDER — ORAL CARE MOUTH RINSE: 15.0000 mL | OROMUCOSAL | Status: DC | PRN

## 2022-01-20 MED ORDER — AMIODARONE HCL 200 MG PO TABS
400.0000 mg | ORAL_TABLET | Freq: Two times a day (BID) | ORAL | Status: AC
  Administered 2022-01-20 – 2022-01-21 (×3): 400 mg via ORAL
  Filled 2022-01-20 (×3): qty 2

## 2022-01-20 MED ORDER — AMIODARONE HCL 200 MG PO TABS: 200.0000 mg | ORAL_TABLET | Freq: Every day | ORAL | Status: DC

## 2022-01-20 MED ORDER — SODIUM CHLORIDE 0.9 % IV SOLN
500.0000 mg | Freq: Two times a day (BID) | INTRAVENOUS | Status: DC
  Administered 2022-01-20 – 2022-01-22 (×5): 500 mg via INTRAVENOUS
  Filled 2022-01-20 (×6): qty 10

## 2022-01-20 MED ORDER — ORAL CARE MOUTH RINSE
15.0000 mL | OROMUCOSAL | Status: DC
  Administered 2022-01-20 – 2022-01-23 (×13): 15 mL via OROMUCOSAL

## 2022-01-20 MED ORDER — OXYCODONE HCL 5 MG PO TABS
2.5000 mg | ORAL_TABLET | Freq: Four times a day (QID) | ORAL | Status: DC
  Administered 2022-01-20 – 2022-01-21 (×4): 2.5 mg via ORAL
  Filled 2022-01-20 (×4): qty 1

## 2022-01-20 MED ORDER — AMIODARONE HCL 200 MG PO TABS
200.0000 mg | ORAL_TABLET | Freq: Two times a day (BID) | ORAL | Status: DC
  Administered 2022-01-22 – 2022-01-23 (×3): 200 mg via ORAL
  Filled 2022-01-20 (×3): qty 1

## 2022-01-20 NOTE — TOC Progression Note (Signed)
Transition of Care [facility]) - Progression Note    Patient Details  Name: [name] MRN: [id number] Date of Birth: September 13, 1928  [facility] CM/SW Contact  Tory Emer[place], [place] Phone Number: 01/20/2022, 8:36 AM  Clinical Narrative:     CSW consulted with pt's son via phone to discuss bed offers. Pt's son requested offers in the [place] area. CSW faxed out to [facility] and [place]. [facility] CSW will f/u pt's son once additional offers come In.  Expected Discharge Plan: Skilled Nursing Facility Barriers to Discharge: No SNF bed  Expected Discharge Plan and Services       Living arrangements for the past 2 months: Single Family Home                                       Social Determinants of Health (SDOH) Interventions SDOH Screenings   Food Insecurity: No Food Insecurity (01/17/2022)  Housing: Low Risk  (01/17/2022)  Transportation Needs: No Transportation Needs (01/17/2022)  Utilities: Not At Risk (01/17/2022)  Tobacco Use: Medium Risk (01/19/2022)    Readmission Risk Interventions     No data to display

## 2022-01-20 NOTE — Consult Note (Signed)
Reason for Consult: AKI/CKD stage IIIb-IV Referring Physician: McDiarmid, MD  Jacob [name] is an 89 y.o. male with a PMH significant for COPD, HTN, atrial fibrillation, HLD, AV block s/p DDD PPM, NSVT, RCC s/p Left nephrectomy, TCC of bladder and right ureter s/p cysto and laser/stent exchange on 01/09/22 and bladder instillation of gemcitabine, and CKD stage who presented to [facility] ED via EMS on 01/17/22 following a fall at home.  He was noted to have a scalp laceration which was repaired by ENT.  Vital signs were initially stable but then developed hypotension in the 80's/40's.  Labs were notable for SCr 2.49, BUN 51, Na 134, Hgb 7.7, plt 107.  CT of head without fracture but exposure of the frontal bone, no ICH.  X-ray of his back revealed new compression fracture of L2.  He was admitted for further evaluation and was started on IVF"s due to hypotension.  He later developed respiratory distress with crackles and IVF's were stopped on 01/18/22.   Cardiology was consulted after he developed sustained VT and started on IV amiodarone with resolution.  Blood cultures were positive for pseudomonas and started on Cefepime.  We have been consulted due to the development of AKI/CKD stage IIIb/IV.  The trend in Scr is seen below.  No history of nephrotoxic agents.  No ACE/ARB or SGLT-2 inhibitors.  He denies any N/V/D, dysuria, pyuria, hematuria, urgency, frequency, or retention.  Trend in Creatinine: Creatinine, Ser  Date/Time Value Ref Range Status  01/20/2022 05:42 AM 4.02 (H) 0.61 - 1.24 mg/dL Final  06/53/9908 52:05 AM 3.19 (H) 0.61 - 1.24 mg/dL Final  05/10/1857 95:66 AM 2.65 (H) 0.61 - 1.24 mg/dL Final  71/77/9564 62:90 AM 2.49 (H) 0.61 - 1.24 mg/dL Final  09/44/6155 82:83 AM 1.94 (H) 0.61 - 1.24 mg/dL Final  32/33/4860 19:47 AM 1.65 (H) 0.61 - 1.24 mg/dL Final  86/54/5613 27:35 AM 2.18 (H) 0.61 - 1.24 mg/dL Final  30/29/5064 62:88 AM 2.84 (H) 0.61 - 1.24 mg/dL Final  05/59/8609 01:69 PM 3.66 (H)  0.61 - 1.24 mg/dL Final  82/96/7000 47:67 PM 3.59 (H) 0.61 - 1.24 mg/dL Final  37/84/5306 31:67 PM 2.12 (H) 0.61 - 1.24 mg/dL Final  77/31/7915 24:84 AM 1.95 (H) 0.61 - 1.24 mg/dL Final  94/83/5599 76:82 AM 1.77 (H) 0.61 - 1.24 mg/dL Final  35/77/5619 71:85 AM 1.74 (H) 0.61 - 1.24 mg/dL Final  69/26/9978 74:66 AM 1.93 (H) 0.61 - 1.24 mg/dL Final  35/56/3469 58:48 AM 1.90 (H) 0.61 - 1.24 mg/dL Final  74/25/4938 23:51 PM 2.08 (H) 0.61 - 1.24 mg/dL Final  05/05/304 06:71 PM 2.22 (H) 0.61 - 1.24 mg/dL Final  51/95/1113 56:52 PM 2.29 (H) 0.61 - 1.24 mg/dL Final  78/02/4430 98:51 AM 2.70 (H) 0.61 - 1.24 mg/dL Final  10/09/3876 28:07 AM 2.30 (H) 0.61 - 1.24 mg/dL Final  66/66/2312 90:64 PM 2.10 (H) 0.61 - 1.24 mg/dL Final  61/40/1203 58:13 AM 1.90 (H) 0.61 - 1.24 mg/dL Final  62/52/2616 74:62 PM 1.82 (H) 0.61 - 1.24 mg/dL Final  82/86/6893 75:91 AM 2.35 (H) 0.50 - 1.35 mg/dL Final  97/94/3912 99:02 AM 2.62 (H) 0.50 - 1.35 mg/dL Final  05/79/3416 10:66 AM 2.90 (H) 0.50 - 1.35 mg/dL Final  16/81/5810 04:22 PM 2.70 (H) 0.50 - 1.35 mg/dL Final  40/82/0649 49:32 PM 2.90 (H) 0.50 - 1.35 mg/dL Final  63/84/1331 34:38 AM 2.34 (H) 0.50 - 1.35 mg/dL Final  81/79/1058 61:00 AM 2.18 (H) 0.50 - 1.35 mg/dL Final  42/90/6991 39:21  PM 1.47 (H) 0.50 - 1.35 mg/dL Final  45/80/9983 38:25 AM 1.37 (H) 0.50 - 1.35 mg/dL Final  05/39/7673 41:93 AM 1.30 0.50 - 1.35 mg/dL Final  79/02/4095 35:32 PM 1.28 0.50 - 1.35 mg/dL Final  99/24/2683 41:96 PM 1.23 0.50 - 1.35 mg/dL Final    PMH:   Past Medical History:  Diagnosis Date   Acute and chronic respiratory failure with hypoxia (HCC) 03/28/2015   Anemia    Arthritis of both hands    Bladder carcinoma (HCC) 03/28/2015   BPH (benign prostatic hypertrophy)    Closed compression fracture of L4 lumbar vertebra, initial encounter (HCC) 01/26/2021   COPD (chronic obstructive pulmonary disease) (HCC)    Coronary artery disease    a.) LHC 11/18/2004: 40% pLAD, 50%  mLAD, 80% D1, 50% mLCx, 100% mRCA - refer to [facility] for PCI; b.) LHC/PCI 11/21/2004: 100% mRCA (3.5 x 23 mm Cypher DES), 95% dRCA (2.5 x 28 mm Cypher); b.) 05/03/12 Cardiac CT: patent RCA stents, nonobs dzs;  c.) 01/2013 Ex MV: nl EF, no ischemia.   Diastolic dysfunction    a.) TTE 08/29/2013: EF 60-65%, mild LVH, mild BAE, mild MR, mod TR, G1DD; b.) TTE 07/01/2020: EF 50-55%, LAEm triv MR, mild-mod Ao sclerosis with no stenosis, G2DD   Dyspnea    GERD (gastroesophageal reflux disease)    Glaucoma of both eyes    H/O hiatal hernia    History of atrial fibrillation 2006   History of diverticulitis    History of kidney stones    History of renal cell carcinoma    a.) s/p LEFT nephroureterectomy   Hyperlipidemia    Hypertension    Inadequate pain control 10/18/2021   LBBB (left bundle branch block)    Left bundle branch block 02/14/2013   Mobitz type 2 second degree AV block    a.) s/p PPM placement 08/29/2013   Nodule of left lung 05/07/2016   Orthostatic hypotension 10/28/2021   Pneumonia    Presence of permanent cardiac pacemaker    Recurrent bladder transitional cell carcinoma (HCC)    a.) s/p TURBT and intravesical chemotherapy   Renal insufficiency    a. Creat rose to 2.34 post-op L nephrectomy.   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    PSH:   Past Surgical History:  Procedure Laterality Date   APPENDECTOMY  02/03/1988   BLADDER INSTILLATION N/A 10/17/2021   Procedure: BLADDER INSTILLATION OF GEMCITABINE;  Surgeon: Vanna Scotland, MD;  Location: [facility];  Service: Urology;  Laterality: N/A;   BLADDER INSTILLATION N/A 01/09/2022   Procedure: BLADDER  INSTILLATION OF GEMCITABINE;  Surgeon: Vanna Scotland, MD;  Location: [facility];  Service: Urology;  Laterality: N/A;   CATARACT EXTRACTION W/ INTRAOCULAR LENS  IMPLANT, BILATERAL  01/02/2009   COLONOSCOPY     CORONARY ANGIOPLASTY WITH STENT PLACEMENT  11/21/2004   Procedure: CORONARY ANGIOPLASTY WITH STENT PLACEMENT (mRCA and dRCA); Location: [facility]; Surgeon: Renne Musca, MD   CYSTOSCOPY W/ RETROGRADES Right 01/13/2014   Procedure: CYSTOSCOPY WITH RETROGRADE PYELOGRAM;  Surgeon: Sebastian Ache, MD;  Location: [facility];  Service: Urology;  Laterality: Right;   CYSTOSCOPY W/ URETERAL STENT PLACEMENT Left 08/22/2013   Procedure: CYSTOSCOPY  WITH RETROGRADE PYELOGRAM/URETERAL STENT PLACEMENT;  Surgeon: [name], MD;  Location: [facility];  Service: Urology;  Laterality: Left;   CYSTOSCOPY W/ URETERAL STENT PLACEMENT Right 10/17/2021   Procedure: CYSTOSCOPY WITH RETROGRADE PYELOGRAM/URETERAL STENT PLACEMENT;  Surgeon: [name], MD;  Location: [facility];  Service: Urology;  Laterality: Right;   CYSTOSCOPY WITH BIOPSY  04/13/2011   Procedure: CYSTOSCOPY WITH BIOPSY;  Surgeon: [name], MD;  Location: [facility];  Service: Urology;  Laterality: N/A;  Cold Cup Biopsy   CYSTOSCOPY WITH FULGERATION Right 01/09/2022   Procedure: CYSTOSCOPY WITH LASER FULGERATION OF URETERAL TUMOR;  Surgeon: [name], MD;  Location: [facility];  Service: Urology;  Laterality: Right;   CYSTOSCOPY WITH RETROGRADE PYELOGRAM, URETEROSCOPY AND STENT PLACEMENT Left 06/19/2013   Procedure: CYSTOSCOPY WITH LEFT RETROGRADE PYELOGRAM, LEFT URETEROSCOPY, ureteral balloon dilatation, BIOPSY LEFT KIDNEY;  Surgeon: [name], MD;  Location: [facility];  Service: Urology;  Laterality: Left;   CYSTOSCOPY/URETEROSCOPY/HOLMIUM LASER/STENT PLACEMENT Right 01/09/2022   Procedure: CYSTOSCOPY/URETEROSCOPY/HOLMIUM LASER/STENT EXCHANGE;  Surgeon: [name], MD;  Location: [facility];   Service: Urology;  Laterality: Right;   EYE SURGERY     INGUINAL HERNIA REPAIR Left 08/22/2013   Procedure: LAPAROSCOPIC INGUINAL HERNIA;  Surgeon: [name], MD;  Location: [facility];  Service: Urology;  Laterality: Left;   INSERT / REPLACE / REMOVE PACEMAKER     IR KYPHO LUMBAR INC FX REDUCE BONE BX UNI/BIL CANNULATION INC/IMAGING  01/31/2021   IR KYPHO LUMBAR INC FX REDUCE BONE BX UNI/BIL CANNULATION INC/IMAGING  10/24/2021   LEFT HEART CATH AND CORONARY ANGIOGRAPHY Left 11/18/2004   Procedure: LEFT HEART CATH AND CORONARY ANGIOGRAPHY; Location: [place]; Surgeon: Neoma Laming, MD   PERMANENT PACEMAKER INSERTION N/A 08/29/2013   Procedure: PERMANENT PACEMAKER INSERTION;  Surgeon: [name], MD;  Location: [facility] C[facility];  Service: Cardiovascular;  Laterality: N/A;  Medtronic dual-chamber   ROBOT ASSITED LAPAROSCOPIC NEPHROURETERECTOMY Left 08/22/2013   Procedure: ROBOT ASSITED LAPAROSCOPIC NEPHROURETERECTOMY;  Surgeon: [name], MD;  Location: [facility];  Service: Urology;  Laterality: Left;   TRANSURETHRAL RESECTION OF BLADDER TUMOR  12/22/2010   TRANSURETHRAL RESECTION OF BLADDER TUMOR  08/04/2011   Procedure: TRANSURETHRAL RESECTION OF BLADDER TUMOR (TURBT);  Surgeon: [name], MD;  Location: [facility];  Service: Urology;  Laterality: N/A;   TRANSURETHRAL RESECTION OF BLADDER TUMOR N/A 01/13/2014   Procedure: TRANSURETHRAL RESECTION OF BLADDER TUMOR (TURBT);  Surgeon: [name], MD;  Location: [facility];  Service: Urology;  Laterality: N/A;   TRANSURETHRAL RESECTION OF BLADDER TUMOR  10/17/2021   Procedure: TRANSURETHRAL RESECTION OF BLADDER TUMOR (TURBT);  Surgeon: [name], MD;  Location: [facility];  Service: Urology;;   URETERAL BIOPSY  10/17/2021   Procedure: URETERAL BIOPSY;  Surgeon: [name], MD;  Location: [facility];  Service: Urology;;   URETEROSCOPY  10/17/2021   Procedure: URETEROSCOPY;  Surgeon: [name], MD;  Location: [facility];  Service: Urology;;    Allergies:  Allergies  Allergen Reactions   Penicillins Hives    BLISTERS    Medications:   Prior to Admission medications   Medication Sig Start Date End Date Taking? Authorizing Provider  acetaminophen (TYLENOL) 325 MG tablet Take 650 mg by mouth every 4 (four) hours as needed for mild pain ((Do not exceed 3000mg )).   Yes [provider]  aspirin 81 MG tablet Take 81 mg by mouth daily.   Yes [provider]  atorvastatin (LIPITOR) 40 MG  tablet TAKE 1 TABLET BY MOUTH ONCE DAILY. APPOINTMENT NEEDED FOR FURTHER REFILLS. Patient taking differently: Take 40 mg by mouth daily. 12/08/21  Yes [facility] Salvia, MD  budesonide-formoterol [facility]) 160-4.5 MCG/ACT inhaler Inhale 2 puffs into the lungs 2 (two) times daily.   Yes [provider]  cetirizine (ZYRTEC) 10 MG tablet Take 10 mg by mouth as needed for allergies.   Yes [provider]  empagliflozin (JARDIANCE) 10 MG TABS tablet Take 10 mg by mouth daily.   Yes [provider]  esomeprazole (NEXIUM) 20 MG capsule Take 20 mg by mouth as needed (acid reflux). 05/24/20  Yes [provider]  isosorbide dinitrate (ISORDIL) 30 MG tablet Take 30 mg by mouth daily.   Yes [provider]  latanoprost (XALATAN) 0.005 % ophthalmic solution Place 1 drop into both eyes at bedtime.   Yes [provider]  Multiple Vitamin (MULTIVITAMIN) tablet Take 1 tablet by mouth daily.   Yes [provider]  Omega-3 Fatty Acids (FISH OIL) 1000 MG CAPS Take 1,000 mg by mouth daily.   Yes [provider]  PROAIR HFA 108 (90 Base) MCG/ACT inhaler Inhale 1 puff into the lungs every 4 (four) hours as needed for wheezing or shortness of breath. 06/14/16  Yes [provider]  Vibegron (GEMTESA) 75 MG TABS Take 75 mg by mouth daily.   Yes [provider]  amoxicillin-clavulanate (AUGMENTIN) 875-125 MG tablet Take 1  tablet by mouth every 12 (twelve) hours. Patient not taking: Reported on 01/17/2022 12/22/21   Vanna Scotland, MD  nystatin-triamcinolone ointment [facility]) Apply 1 Application topically 2 (two) times daily. Patient taking differently: Apply 1 Application topically daily. 12/15/21   Vanna Scotland, MD    Inpatient medications:  acetaminophen  650 mg Oral Q6H   Or   acetaminophen  650 mg Rectal Q6H   amiodarone  400 mg Oral BID   Followed by   Melene Muller ON 01/22/2022] amiodarone  200 mg Oral BID   Followed by   Melene Muller ON 01/29/2022] amiodarone  200 mg Oral Daily   atorvastatin  40 mg Oral Daily   calcitonin (salmon)  1 spray Alternating Nares Daily   insulin aspart  0-9 Units Subcutaneous Q4H   lactose free nutrition  237 mL Oral TID WC   latanoprost  1 drop Both Eyes QHS   mometasone-formoterol  2 puff Inhalation BID   And   umeclidinium bromide  1 puff Inhalation Daily   multivitamin with minerals  1 tablet Oral Daily   oxyCODONE  2.5 mg Oral QID   pantoprazole  40 mg Oral Daily    Discontinued Meds:   Medications Discontinued During This Encounter  Medication Reason   Multiple Vitamins-Minerals (CERTA-VITE PO) No longer needed (for PRN medications)   Budeson-Glycopyrrol-Formoterol (BREZTRI AEROSPHERE) 160-9-4.8 MCG/ACT AERO Patient Preference   triamcinolone cream (KENALOG) 0.1 % Patient Preference   senna-docusate (SENOKOT-S) 8.6-50 MG per tablet Patient Preference   Budeson-Glycopyrrol-Formoterol 160-9-4.8 MCG/ACT AERO Duplicate   ceFEPIme (MAXIPIME) 2 g in sodium chloride 0.9 % 100 mL IVPB    ceFEPIme (MAXIPIME) 2 g in sodium chloride 0.9 % 100 mL IVPB    vancomycin (VANCOREADY) IVPB 1500 mg/300 mL    vancomycin (VANCOREADY) IVPB 750 mg/150 mL    HYDROmorphone (DILAUDID) injection 1 mg    acetaminophen (TYLENOL) tablet 650 mg    acetaminophen (TYLENOL) suppository 650 mg    HYDROmorphone (DILAUDID) injection 0.5 mg    HYDROmorphone (DILAUDID) tablet 2 mg  ceFEPIme  (MAXIPIME) 2 g in sodium chloride 0.9 % 100 mL IVPB    lactated ringers infusion    enoxaparin (LOVENOX) injection 30 mg    amiodarone (PACERONE) tablet 400 mg     Social History:  reports that he quit smoking about 21 years ago. His smoking use included cigarettes. He has a 50.00 pack-year smoking history. He has never used smokeless tobacco. He reports that he does not drink alcohol and does not use drugs.  Family History:   Family History  Problem Relation Age of Onset   Heart attack Mother    Heart attack Father    Prostate cancer Neg Hx    Bladder Cancer Neg Hx    Kidney cancer Neg Hx     Pertinent items are noted in HPI. Weight change:   Intake/Output Summary (Last 24 hours) at 01/20/2022 1319 Last data filed at 01/20/2022 1100 Gross per 24 hour  Intake 780 ml  Output 500 ml  Net 280 ml   BP (!) 108/44 (BP Location: Right Arm)   Pulse 69   Temp 98.2 F (36.8 C) (Oral)   Resp 18   Ht 5\' 6"  (1.676 m)   Wt 68.9 kg   SpO2 100%   BMI 24.53 kg/m  Vitals:   01/20/22 0400 01/20/22 0408 01/20/22 0741 01/20/22 1228  BP: (!) 119/48  (!) 104/57 (!) 108/44  Pulse: 70  72 69  Resp: 20  20 18   Temp:  98.7 F (37.1 C) 98.8 F (37.1 C) 98.2 F (36.8 C)  TempSrc:  Oral Axillary Oral  SpO2: 95%  95% 100%  Weight:      Height:         General appearance: somnolent but able to answer questions and in NAD Head: scalp laceration wrapped Resp: clear to auscultation bilaterally Cardio: regular rate and rhythm, S1, S2 normal, no murmur, click, rub or gallop GI: soft, non-tender; bowel sounds normal; no masses,  no organomegaly Extremities: extremities normal, atraumatic, no cyanosis or edema  Labs: Basic Metabolic Panel: Recent Labs  Lab 01/17/22 0708 01/18/22 0652 01/19/22 0349 01/20/22 0542  NA 134* 134* 133* 134*  K 4.4 4.4 4.0 4.5  CL 98 103 102 104  CO2 23 22 22  19*  GLUCOSE 176* 121* 141* 116*  BUN 51* 56* 70* 82*  CREATININE 2.49* 2.65* 3.19* 4.02*  CALCIUM  9.0 8.4* 7.9* 7.9*   Liver Function Tests: No results for input(s): "AST", "ALT", "ALKPHOS", "BILITOT", "PROT", "ALBUMIN" in the last 168 hours. No results for input(s): "LIPASE", "AMYLASE" in the last 168 hours. No results for input(s): "AMMONIA" in the last 168 hours. CBC: Recent Labs  Lab 01/17/22 0708 01/17/22 1830 01/17/22 2345 01/18/22 0652 01/19/22 0349 01/20/22 0542 01/20/22 1154  WBC 9.5  --   --  8.0 6.6 9.0  --   HGB 7.7*   < > 8.6* 8.7* 8.0* 7.7*  --   HCT 23.9*   < > 25.9* 26.0* 23.8* 23.0*  --   MCV 96.8  --   --  91.9 91.5 92.0  --   PLT 107*  --   --  74* 59* 57* 71*   < > = values in this interval not displayed.   PT/INR: @LABRCNTIP (inr:5) Cardiac Enzymes: )No results for input(s): "CKTOTAL", "CKMB", "CKMBINDEX", "TROPONINI" in the last 168 hours. CBG: Recent Labs  Lab 01/20/22 0144 01/20/22 0413 01/20/22 0628 01/20/22 0734 01/20/22 1226  GLUCAP 140* 112* 110* 114* 139*    Iron Studies:  No results for input(s): "IRON", "TIBC", "TRANSFERRIN", "FERRITIN" in the last 168 hours.  Xrays/Other Studies: US RENAL  Result Date: 01/20/2022 CLINICAL DATA:  AKI. Status post left nephrectomy. Per technologist note, limited examination due to limitations in patient positioning due to thoracic spine brace. EXAM: RENAL / URINARY TRACT ULTRASOUND COMPLETE COMPARISON:  CT abdomen and pelvis without contrast October 19, 2021 FINDINGS: Right Kidney: Renal measurements: 8.3 x 5.0 x 4.1 cm = volume: 89.4 mL. Echogenicity within normal limits. No mass or hydronephrosis visualized. Left Kidney: The left kidney is surgically absent. Bladder: Appears normal for degree of bladder distention. Other: None. IMPRESSION: No right nephrolithiasis or hydronephrosis. Electronically Signed   By: Jacob Moores M.D.   On: 01/20/2022 11:11   ECHOCARDIOGRAM COMPLETE  Result Date: 01/19/2022    ECHOCARDIOGRAM REPORT   Patient Name:   Jacob [name] Date of Exam: 01/19/2022 Medical Rec #:   [id number]         Height:       66.0 in Accession #:    [id number]        Weight:       152.0 lb Date of Birth:  [birth year]         BSA:          1.780 m Patient Age:    89 years          BP:           118/50 mmHg Patient Gender: M                 HR:           71 bpm. Exam Location:  Inpatient Procedure: 2D Echo, Cardiac Doppler, Color Doppler and 3D Echo Indications:     Bacteremia R78.81  History:         Patient has prior history of Echocardiogram examinations, most                  recent 07/01/2020. Pacemaker, COPD, Arrythmias:AV block. V tach                  and PVC; Risk Factors:Dyslipidemia and Hypertension. Renal cell                  carcinoma. Bacteremia due to Pseudomonas.                  Fall with back and neck injuries.  Sonographer:     Leta Jungling RDCS Referring Phys:  [id number] TODD D MCDIARMID Diagnosing Phys: [facility]  Sonographer Comments: Imaging difficult due to lumabr fracture patient in back brace. IMPRESSIONS  1. Left ventricular ejection fraction, by estimation, is 55 to 60%. The left ventricle has normal function. The left ventricle has no regional wall motion abnormalities. There is mild asymmetric left ventricular hypertrophy of the basal-septal segment. Left ventricular diastolic parameters are consistent with Grade I diastolic dysfunction (impaired relaxation).  2. Right ventricular systolic function is mildly reduced. The right ventricular size is mildly enlarged. There is moderately elevated pulmonary artery systolic pressure. The estimated right ventricular systolic pressure is 57.0 mmHg.  3. Left atrial size was mild to moderately dilated.  4. Right atrial size was moderately dilated.  5. The mitral valve is abnormal. Moderate mitral valve regurgitation. No evidence of mitral stenosis. The mean mitral valve gradient is 4.0 mmHg. Moderate mitral annular calcification.  6. Tricuspid valve regurgitation is moderate.  7. The aortic valve is grossly normal. There is mild  calcification of the aortic valve. Aortic valve regurgitation is not visualized. Aortic valve sclerosis is present, with no evidence of aortic valve stenosis.  8. The inferior vena cava is dilated in size with <50% respiratory variability, suggesting right atrial pressure of 15 mmHg. Conclusion(s)/Recommendation(s): No evidence of valvular vegetations on this transthoracic echocardiogram. Consider a transesophageal echocardiogram to exclude infective endocarditis if clinically indicated. Compared to prior echo, increased RVSP. FINDINGS  Left Ventricle: Left ventricular ejection fraction, by estimation, is 55 to 60%. The left ventricle has normal function. The left ventricle has no regional wall motion abnormalities. The left ventricular internal cavity size was normal in size. There is  mild asymmetric left ventricular hypertrophy of the basal-septal segment. Abnormal (paradoxical) septal motion, consistent with RV pacemaker. Left ventricular diastolic parameters are consistent with Grade I diastolic dysfunction (impaired relaxation). Right Ventricle: The right ventricular size is mildly enlarged. No increase in right ventricular wall thickness. Right ventricular systolic function is mildly reduced. There is moderately elevated pulmonary artery systolic pressure. The tricuspid regurgitant velocity is 3.24 m/s, and with an assumed right atrial pressure of 15 mmHg, the estimated right ventricular systolic pressure is 57.0 mmHg. Left Atrium: Left atrial size was mild to moderately dilated. Right Atrium: Right atrial size was moderately dilated. Pericardium: There is no evidence of pericardial effusion. Mitral Valve: The mitral valve is abnormal. Moderate mitral annular calcification. Moderate mitral valve regurgitation, with posteriorly-directed jet. No evidence of mitral valve stenosis. MV peak gradient, 8.2 mmHg. The mean mitral valve gradient is 4.0  mmHg. Tricuspid Valve: The tricuspid valve is grossly normal.  Tricuspid valve regurgitation is moderate . No evidence of tricuspid stenosis. Aortic Valve: The aortic valve is grossly normal. There is mild calcification of the aortic valve. Aortic valve regurgitation is not visualized. Aortic valve sclerosis is present, with no evidence of aortic valve stenosis. Pulmonic Valve: The pulmonic valve was grossly normal. Pulmonic valve regurgitation is trivial. No evidence of pulmonic stenosis. Aorta: The aortic root is normal in size and structure. Venous: The inferior vena cava is dilated in size with less than 50% respiratory variability, suggesting right atrial pressure of 15 mmHg. IAS/Shunts: The interatrial septum was not well visualized. Additional Comments: A device lead is visualized.  LEFT VENTRICLE PLAX 2D LVIDd:         4.00 cm   Diastology LVIDs:         2.70 cm   LV e' medial:    4.73 cm/s LV PW:         1.00 cm   LV E/e' medial:  27.5 LV IVS:        1.30 cm   LV e' lateral:   6.68 cm/s LVOT diam:     2.50 cm   LV E/e' lateral: 19.5 LV SV:         67 LV SV Index:   38 LVOT Area:     4.91 cm                           3D Volume EF:                          3D EF:        58 %                          LV EDV:       186  ml                          LV ESV:       78 ml                          LV SV:        109 ml RIGHT VENTRICLE RV Basal diam:  5.80 cm RV Mid diam:    5.20 cm RV S prime:     8.25 cm/s TAPSE (M-mode): 1.7 cm LEFT ATRIUM             Index        RIGHT ATRIUM           Index LA diam:        4.70 cm 2.64 cm/m   RA Area:     24.60 cm LA Vol (A2C):   85.8 ml 48.21 ml/m  RA Volume:   78.30 ml  44.00 ml/m LA Vol (A4C):   65.9 ml 37.03 ml/m LA Biplane Vol: 77.0 ml 43.27 ml/m  AORTIC VALVE LVOT Vmax:   93.40 cm/s LVOT Vmean:  54.500 cm/s LVOT VTI:    0.137 m  AORTA Ao Root diam: 3.50 cm MITRAL VALVE                  TRICUSPID VALVE MV Area (PHT): 3.39 cm       TR Peak grad:   42.0 mmHg MV Area VTI:   1.53 cm       TR Mean grad:   21.0 mmHg MV Peak grad:  8.2  mmHg       TR Vmax:        324.00 cm/s MV Mean grad:  4.0 mmHg       TR Vmean:       207.0 cm/s MV Vmax:       1.43 m/s MV Vmean:      92.4 cm/s      SHUNTS MV Decel Time: 224 msec       Systemic VTI:  0.14 m MR Peak grad:    105.7 mmHg   Systemic Diam: 2.50 cm MR Mean grad:    64.0 mmHg MR Vmax:         514.00 cm/s MR Vmean:        378.0 cm/s MR PISA:         1.57 cm MR PISA Eff ROA: 12 mm MR PISA Radius:  0.50 cm MV E velocity: 130.00 cm/s MV A velocity: 138.00 cm/s MV E/A ratio:  0.94 Photographer signed by [name] Signature Date/Time: 01/19/2022/10:27:48 AM    Final (Updated)      Assessment/Plan:  AKI/CKD stage IIIb-IV, oliguric - likely ischemic ATN following hypotension with VT and sepsis in setting of solitary kidney.  Renal US without evidence of hydronephrosis of solitary right kidney and bladder scans negative for urinary retention.  I discussed with him the severity of his CKD at baseline and discussed RRT.  He is not a candidate for long-term HD given his advanced age, multiple co-morbidities and poor functional and nutritional status.  He is open to comfort care if his renal function continues to decline.  Thankfully no urgent indication for dialysis and would likely require CRRT given his hypotensive episodes.  Would recommend palliative care consult to help set goals/limits of care.   Avoid nephrotoxic medications including NSAIDs and iodinated intravenous  contrast exposure unless the latter is absolutely indicated.   Preferred narcotic agents for pain control are hydromorphone, fentanyl, and methadone. Morphine should not be used.  Avoid Baclofen and avoid oral sodium phosphate and magnesium citrate based laxatives / bowel preps.  Continue strict Input and Output monitoring.  Will monitor the patient closely with you and intervene or adjust therapy as indicated by changes in clinical status/labs   Pseudomonas bacteremia - Urosepsis currently on IV cefepime.  TTE negative  for vegetations.  ID following and awaiting TEE. Ventricular tachycardia, sustained - improved with amiodarone, EP following. 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  EP following. Anemia of CKD stage IV - transfuse prn Thrombocytopenia - DIC panel ordered, off of lovenox. 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 Lumbar compression fracture - TLSO brace in place. HTN - low Bp's and home meds held.   Jomarie Longs A Fauna Neuner 01/20/2022, 1:19 PM

## 2022-01-20 NOTE — Progress Notes (Addendum)
Daily Progress Note Intern Pager: [phone]  Patient name: ALMIN LIVINGSTONE Medical record number: [id number] Date of birth: January 25, 1928 Age: 89 y.o. Gender: male  Primary Care Provider: Sherrie Mustache, MD Consultants: ID, EP, NSGY, ENT  Code Status: Full Code   [name]: Admitted  1/18: Blood cx 24 hr result: pseudomonas  Assessment and Plan:  EUGINE BUBB is a 89 y.o. male with PMHx renal cell carcinoma with recent reoccurrence, COPD with no O2 at baseline, hyperlipidemia, advanced AV block s/p DDD PPM 2015, NSVT, PVCs, excised infiltrative basal cell carcinoma on the face presenting with fall without loss of consciousness but did hit head during the process.   AKI worsening, Cr 2.49 (1/16) to 4.02 (1/19). S/P 2 bolus in the ED and has been on 100 mL/hr maintenance fluids for >48 hours with worsening function. He had no urine output overnight with a bladder scan this morning at 0400 with 0 mL according to night RN. I&Os updated today and showing continued low output. Concerning with his history of stenosis for a post-renal AKI. Will obtain renal U/S and stop IVFs.  Will consult nephrology to assist with care.   Patient continues to need SNF for care and PT. With his age and multiple co-morbidities, has poor prognosis and a tough recovery from this fall.   * Bacteremia Febrile to T max of 100.4 on 1/17. Afebrile since. VS stable. Blood cx showing pseudomonas growth after 24 hours. TTE WNL 1/18.  - start cefepime per pharmacy (1/17 - 1/18) - started meropenem for coverage (1/19 - )  - ID consulted, appreciate recommendations  - pending TEE, EP on board due to pacemaker  - f/u urine cx  - monitor fever curve  - ID consult placed, appreciate recommendations   Acute kidney injury superimposed on chronic kidney disease (HCC) Cr continues to trend up now at 4.02; has been on fluids for several days and urine output remains low. Bladder scans  negative for retention.  - consult nephrology  - ordering renal ultrasound  - stop IVF  - cont bladder scans Q6H  - Strict I/Os  - daily BMP   Thrombocytopenia (HCC) Platelets trending down. Last was 57 - DIC panel ordered  - hold Lovenox  - start SCDs   Lumbar compression fracture (HCC) Working with PT. If mobility becomes limited by the TLSO brace, will reach out to NSGY.  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 Urology plans to begin intravesical BCG 6 weeks postop for a total of 6 weeks.  - Monitor ins and outs - Will need follow-up with urology outpatient  V tach (HCC) Resolved.  - Amiodarone 400 mg p.o. twice daily for 5 days - Followed by amiodarone 40 mg p.o. daily until EP follow-up outpatient - Cardiology following, appreciate recommendations   HTN (hypertension) Holding any anti-hypertensive's; normotensive now. Home Amlodipine 5mg  per chart review but not in med rec.     FEN/GI: regular diet  PPx: Lovenox  Dispo:SNF pending clinical improvement . Barriers include bacteremia, worsening AKI.   Subjective:  NAEO, resting comfortably.  Objective: Temp:  [97.8 F (36.6 C)-98.8 F (37.1 C)] 98.8 F (37.1 C) (01/19 0741) Pulse Rate:  [61-93] 72 (01/19 0741) Resp:  [13-34] 20 (01/19 0741) BP: (90-123)/(38-62) 104/57 (01/19 0741) SpO2:  [91 %-99 %] 95 % (01/19 0741) Physical Exam: Chronically ill-appearing, no acute distress, frail appearing, brace in place  Cardio: Regular rate, regular rhythm, no murmurs on exam. Pulm: Clear, no wheezing, no crackles. No increased work of breathing Abdominal: bowel sounds present,  soft, non-tender, non-distended Extremities: no peripheral edema  Neuro: alert and oriented x3, speech normal in content  Laboratory: Most recent CBC Lab Results  Component Value Date   WBC 9.0 01/20/2022   HGB 7.7 (L) 01/20/2022   HCT 23.0 (L) 01/20/2022   MCV 92.0 01/20/2022   PLT 57 (L) 01/20/2022   Most recent BMP    Latest Ref Rng & Units 01/20/2022    5:42 AM  BMP  Glucose 70 - 99 mg/dL 455   BUN 8 - 23 mg/dL 82   Creatinine 7.66 - 1.24 mg/dL 3.05   Sodium 866 - 913 mmol/L 134   Potassium 3.5 - 5.1 mmol/L 4.5   Chloride 98 - 111 mmol/L 104   CO2 22 - 32 mmol/L 19   Calcium 8.9 - 10.3 mg/dL 7.9     [name], DO 01/20/2022, 10:24 AM  PGY-1, [facility] FPTS Intern pager: [phone], text pages welcome Secure chat group [facility] Teaching Service

## 2022-01-20 NOTE — Progress Notes (Signed)
Electrophysiology Rounding Note  Patient Name: Jacob [name] Date of Encounter: 01/20/2022  Primary Cardiologist: Sherryl Manges, MD Electrophysiologist: Dr. Graciela Husbands   Subjective   Feeling OK this, but sore and tired of lying in bed.   Inpatient Medications    Scheduled Meds:  acetaminophen  650 mg Oral Q6H   Or   acetaminophen  650 mg Rectal Q6H   amiodarone  400 mg Oral BID   atorvastatin  40 mg Oral Daily   calcitonin (salmon)  1 spray Alternating Nares Daily   enoxaparin (LOVENOX) injection  30 mg Subcutaneous Q24H   insulin aspart  0-9 Units Subcutaneous Q4H   lactose free nutrition  237 mL Oral TID WC   latanoprost  1 drop Both Eyes QHS   mometasone-formoterol  2 puff Inhalation BID   And   umeclidinium bromide  1 puff Inhalation Daily   multivitamin with minerals  1 tablet Oral Daily   pantoprazole  40 mg Oral Daily   Continuous Infusions:  meropenem (MERREM) IV     PRN Meds: albuterol, HYDROmorphone, senna-docusate   Vital Signs    Vitals:   01/20/22 0300 01/20/22 0400 01/20/22 0408 01/20/22 0741  BP: (!) 123/49 (!) 119/48  (!) 104/57  Pulse: 69 70  72  Resp:  20  20  Temp:   98.7 F (37.1 C) 98.8 F (37.1 C)  TempSrc:   Oral Axillary  SpO2: 99% 95%  95%  Weight:      Height:        Intake/Output Summary (Last 24 hours) at 01/20/2022 1006 Last data filed at 01/20/2022 0745 Gross per 24 hour  Intake 360 ml  Output 500 ml  Net -140 ml   Filed Weights   01/17/22 1018  Weight: 68.9 kg    Physical Exam   GEN- The patient is elderly and chronically ill appearing, alert and oriented x 3 today.   HEENT- No gross abnormality.  Lungs- CTAB, normal effort.  Heart- Regular rate and rhythm, No M/G/R.  GI- soft, NT, ND, + BS Extremities- no clubbing or cyanosis. No edema Neuro- No obvious focal abnormality.   Telemetry    V pacing 60-70s (personally reviewed)  Patient Profile     Jacob [name] is a 89 y.o. male with a history of  Bladder tumor, COPD, Renal cell carcinoma s/p Left nephrectomy, HLD, Advanced AV block s/p DDD PPM 2015, NSVT, PVCs, and skin cancer of the head with recent excision who is being seen for the evaluation of VT on device interrogation as well as bacteremia with pacemaker in situ.   Assessment & Plan    1.  Ventricular tachycardia, sustained 06/2021 Myoview around that time without ischemia with falsely down EF, normal on Echo.  Quiescent on po amiodarone. Continue taper.  Given pts age and co-morbidities, he is not a candidate for ICD upgrade.  Potassium4.5 (01/19 0542) Magnesium  2.5* (01/17 9682) Creatinine, ser  4.02* (01/19 0542) Keep K > 4.0 and Mg > 2.0    2. CAD w prior stenting Non-ischemic myoview 06/2021 HS trop pending No CP.    3. CHB s/p [employer] DDD PPM PPM with normal function He carries a PMH of atrial fibrillation, but this has not been demonstrated on his PPM. Not currently on OAC.   4. Bacteremia 1/4 BCx with staph epi and pseudomonas  BCx also growing pseudomonas ABx broadened to Meropenem.  Repeat BCx pending.  TEE pending.  If repeat BCx and TEE negative, would  not consider PPM involved per ID.  If repeat BCx OR TEE positive, would require lifelong suppression. Given age and co morbidities, pt is not a candidate for device extraction.    5. AKI Cr up to 4.0 from baseline 1.6 - 2.0  For questions or updates, please contact [facility] Please consult [email or web address] for contact info under Cardiology/STEMI.  Signed, Graciella Freer, PA-C  01/20/2022, 10:06 AM

## 2022-01-20 NOTE — Plan of Care (Signed)
  Problem: Education: Goal: Knowledge of General Education information will improve Description: Including pain rating scale, medication(s)/side effects and non-pharmacologic comfort measures Outcome: Progressing   Problem: Metabolic: Goal: Ability to maintain appropriate glucose levels will improve Outcome: Progressing

## 2022-01-20 NOTE — Assessment & Plan Note (Deleted)
Plt wnl today

## 2022-01-20 NOTE — Care Management Important Message (Signed)
Important Message  Patient Details  Name: Jacob [name] MRN: [id number] Date of Birth: 1929-01-01   Medicare Important Message Given:  Yes     Milaina Sher Stefan Church 01/20/2022, 2:55 PM

## 2022-01-20 NOTE — TOC Progression Note (Signed)
Transition of Care [facility]) - Progression Note    Patient Details  Name: Jacob [name] MRN: [id number] Date of Birth: 1928-03-20  Transition of Care [facility]) CM/SW Contact  Tory Emerald, [place] Phone Number: 01/20/2022, 8:45 AM  Clinical Narrative:     CSW reviewed additional bed offers and reached out to pt's son and left vm. CSW received a returned call and pt's son would like to go with [facility]. CSW messaged [facility] to request auth.   Expected Discharge Plan: Skilled Nursing Facility Barriers to Discharge: No SNF bed  Expected Discharge Plan and Services       Living arrangements for the past 2 months: Single Family Home                                       Social Determinants of Health (SDOH) Interventions SDOH Screenings   Food Insecurity: No Food Insecurity (01/17/2022)  Housing: Low Risk  (01/17/2022)  Transportation Needs: No Transportation Needs (01/17/2022)  Utilities: Not At Risk (01/17/2022)  Tobacco Use: Medium Risk (01/19/2022)    Readmission Risk Interventions     No data to display

## 2022-01-20 NOTE — Progress Notes (Signed)
[facility]  Date of Admission:  01/17/2022     Abx: 1/19-c meropenem  1/16-19 cefepime  ASSESSMENT: 89 yo male with l2 compression fx on tlso, pacemaker for heart block (placed 2015), recent glf at home admitted for that found to have pseudomonas and cons bacteremia and also pseudomonas in urine culture, meeting sepsis criteria on presentation   Patient doesn't have any uti sx or localizing sx otherwise, but has had fulguration and foley catheter for  bladder/ureter cancer, and agree source probably that  Right now complaining of back pain and has L2 compression fx. No new pain quality  In setting pacer and l2 fracture will need to r/o seeding of those area  1/18 repeat bcx ngtd. 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)   Pacemaker   Solitary kidney, acquired   Type 2 diabetes mellitus without complication, without long-term current use of insulin (HCC)   CKD stage 4 secondary to hypertension (HCC)   Acute kidney injury superimposed on chronic kidney disease (HCC)   Fall   Head injury due to trauma   V tach (HCC)   Lumbar compression fracture (HCC)   Protein-calorie malnutrition, severe   Thrombocytopenia (HCC)   Allergies  Allergen Reactions   Penicillins Hives    BLISTERS    Scheduled Meds:  acetaminophen  650 mg Oral Q6H    Or   acetaminophen  650 mg Rectal Q6H   amiodarone  400 mg Oral BID   Followed by   Melene Muller ON 01/22/2022] amiodarone  200 mg Oral BID   Followed by   Melene Muller ON 01/29/2022] amiodarone  200 mg Oral Daily   atorvastatin  40 mg Oral Daily   calcitonin (salmon)  1 spray Alternating Nares Daily   insulin aspart  0-9 Units Subcutaneous Q4H   lactose free nutrition  237 mL Oral TID WC   latanoprost  1 drop Both Eyes QHS   mometasone-formoterol  2 puff Inhalation BID   And   umeclidinium bromide  1 puff Inhalation Daily   multivitamin with minerals  1 tablet Oral Daily   mouth rinse  15 mL Mouth Rinse 4 times per day   oxyCODONE  2.5 mg Oral QID   pantoprazole  40 mg Oral Daily   Continuous Infusions:  meropenem (MERREM) IV Stopped (01/20/22 1141)   PRN Meds:.albuterol, HYDROmorphone, mouth rinse, senna-docusate   SUBJECTIVE: Stable lower back pain No other complaint Afebrile  PsA doesn't appear to be sensitive to cefepime  Review of Systems: ROS All other ROS was negative, except mentioned above     OBJECTIVE: Vitals:   01/20/22 0400 01/20/22 0408 01/20/22 0741 01/20/22 1228  BP: (!) 119/48  (!) 104/57 (!) 108/44  Pulse: 70  72 69  Resp: 20  20 18   Temp:  98.7 F (37.1 C) 98.8 F (37.1 C) 98.2 F (  36.8 C)  TempSrc:  Oral Axillary Oral  SpO2: 95%  95% 100%  Weight:      Height:       Body mass index is 24.53 kg/m.  Physical Exam General/constitutional: no distress, pleasant; lying in bed HEENT: Normocephalic, PER, Conj Clear, EOMI, Oropharynx clear Neck supple CV: rrr no mrg -- pacer site nontender and non-fluctuant Lungs: clear to auscultation, normal respiratory effort Abd: Soft, Nontender Ext: no edema Skin: left forehead maceration -- s/p sutured and intact Neuro: generalized weakness MSK: no peripheral joint swelling/tenderness/warmth  Lab Results Lab Results  Component Value Date   WBC 9.0 01/20/2022   HGB 7.7 (L) 01/20/2022   HCT 23.0 (L)  01/20/2022   MCV 92.0 01/20/2022   PLT 71 (L) 01/20/2022    Lab Results  Component Value Date   CREATININE 4.02 (H) 01/20/2022   BUN 82 (H) 01/20/2022   NA 134 (L) 01/20/2022   K 4.5 01/20/2022   CL 104 01/20/2022   CO2 19 (L) 01/20/2022    Lab Results  Component Value Date   ALT 21 01/25/2021   AST 30 01/25/2021   ALKPHOS 70 01/25/2021   BILITOT 0.8 01/25/2021      Microbiology: Recent Results (from the past 240 hour(s))  Urine Culture     Status: Abnormal   Collection Time: 01/18/22  4:23 AM   Specimen: Urine, Clean Catch  Result Value Ref Range Status   Specimen Description URINE, CLEAN CATCH  Final   Special Requests   Final    NONE Performed at [facility], [address], [place], [place] [address]    Culture >=100,000 COLONIES/mL PSEUDOMONAS AERUGINOSA (A)  Final   Report Status 01/20/2022 FINAL  Final   Organism ID, Bacteria PSEUDOMONAS AERUGINOSA (A)  Final      Susceptibility   Pseudomonas aeruginosa - MIC*    CEFTAZIDIME 8 SENSITIVE Sensitive     CIPROFLOXACIN <=0.25 SENSITIVE Sensitive     GENTAMICIN 2 SENSITIVE Sensitive     IMIPENEM 2 SENSITIVE Sensitive     PIP/TAZO 16 SENSITIVE Sensitive     * >=100,000 COLONIES/mL PSEUDOMONAS AERUGINOSA  Culture, blood (Routine X 2) w Reflex to ID Panel     Status: Abnormal (Preliminary result)   Collection Time: 01/18/22  6:55 AM   Specimen: BLOOD RIGHT HAND  Result Value Ref Range Status   Specimen Description BLOOD RIGHT HAND  Final   Special Requests   Final    BOTTLES DRAWN AEROBIC AND ANAEROBIC Blood Culture adequate volume   Culture  Setup Time   Final    GRAM NEGATIVE RODS AEROBIC BOTTLE ONLY CRITICAL RESULT CALLED TO, READ BACK BY AND VERIFIED WITH: V BRYK,PHARMD@[id number]  01/19/22 [place]    Culture (A)  Final    PSEUDOMONAS AERUGINOSA SUSCEPTIBILITIES TO FOLLOW Performed at [facility], [place] [address], [place], [place] [address]    Report Status PENDING  Incomplete  Blood Culture ID Panel  (Reflexed)     Status: Abnormal   Collection Time: 01/18/22  6:55 AM  Result Value Ref Range Status   Enterococcus faecalis NOT DETECTED NOT DETECTED Final   Enterococcus Faecium NOT DETECTED NOT DETECTED Final   Listeria monocytogenes NOT DETECTED NOT DETECTED Final   Staphylococcus species DETECTED (A) NOT DETECTED Final    Comment: CRITICAL RESULT CALLED TO, READ BACK BY AND VERIFIED WITH: V BRYK,PHARMD@[id number]  01/19/22 [place]    Staphylococcus aureus (BCID) NOT DETECTED NOT DETECTED Final   Staphylococcus epidermidis DETECTED (  A) NOT DETECTED Final    Comment: CRITICAL RESULT CALLED TO, READ BACK BY AND VERIFIED WITH: V BRYK,PHARMD@[id number]  01/19/22 [place]    Staphylococcus lugdunensis NOT DETECTED NOT DETECTED Final   Streptococcus species NOT DETECTED NOT DETECTED Final   Streptococcus agalactiae NOT DETECTED NOT DETECTED Final   Streptococcus pneumoniae NOT DETECTED NOT DETECTED Final   Streptococcus pyogenes NOT DETECTED NOT DETECTED Final   A.calcoaceticus-baumannii NOT DETECTED NOT DETECTED Final   Bacteroides fragilis NOT DETECTED NOT DETECTED Final   Enterobacterales NOT DETECTED NOT DETECTED Final   Enterobacter cloacae complex NOT DETECTED NOT DETECTED Final   Escherichia coli NOT DETECTED NOT DETECTED Final   Klebsiella aerogenes NOT DETECTED NOT DETECTED Final   Klebsiella oxytoca NOT DETECTED NOT DETECTED Final   Klebsiella pneumoniae NOT DETECTED NOT DETECTED Final   Proteus species NOT DETECTED NOT DETECTED Final   Salmonella species NOT DETECTED NOT DETECTED Final   Serratia marcescens NOT DETECTED NOT DETECTED Final   Haemophilus influenzae NOT DETECTED NOT DETECTED Final   Neisseria meningitidis NOT DETECTED NOT DETECTED Final   Pseudomonas aeruginosa DETECTED (A) NOT DETECTED Final    Comment: CRITICAL RESULT CALLED TO, READ BACK BY AND VERIFIED WITH: V BRYK,PHARMD@[id number]  01/19/22 [place]    Stenotrophomonas maltophilia NOT DETECTED NOT DETECTED Final   Candida albicans NOT  DETECTED NOT DETECTED Final   Candida auris NOT DETECTED NOT DETECTED Final   Candida glabrata NOT DETECTED NOT DETECTED Final   Candida krusei NOT DETECTED NOT DETECTED Final   Candida parapsilosis NOT DETECTED NOT DETECTED Final   Candida tropicalis NOT DETECTED NOT DETECTED Final   Cryptococcus neoformans/gattii NOT DETECTED NOT DETECTED Final   CTX-M ESBL NOT DETECTED NOT DETECTED Final   Carbapenem resistance IMP NOT DETECTED NOT DETECTED Final   Carbapenem resistance KPC NOT DETECTED NOT DETECTED Final   Methicillin resistance mecA/C NOT DETECTED NOT DETECTED Final   Carbapenem resistance NDM NOT DETECTED NOT DETECTED Final   Carbapenem resistance VIM NOT DETECTED NOT DETECTED Final    Comment: Performed at [facility], [address] [place], [place] [address]  Culture, blood (Routine X 2) w Reflex to ID Panel     Status: None (Preliminary result)   Collection Time: 01/18/22  7:06 AM   Specimen: BLOOD RIGHT HAND  Result Value Ref Range Status   Specimen Description BLOOD RIGHT HAND  Final   Special Requests   Final    BOTTLES DRAWN AEROBIC AND ANAEROBIC Blood Culture adequate volume   Culture   Final    NO GROWTH 2 DAYS Performed at [facility], [place] [address], [place], [place] [address]    Report Status PENDING  Incomplete  Culture, blood (Routine X 2) w Reflex to ID Panel     Status: None (Preliminary result)   Collection Time: 01/19/22  9:35 AM   Specimen: BLOOD RIGHT HAND  Result Value Ref Range Status   Specimen Description BLOOD RIGHT HAND  Final   Special Requests   Final    BOTTLES DRAWN AEROBIC ONLY Blood Culture adequate volume   Culture   Final    NO GROWTH < 24 HOURS Performed at [facility], [place] [address], [place], [place] [address]    Report Status PENDING  Incomplete  Culture, blood (Routine X 2) w Reflex to ID Panel     Status: None (Preliminary result)   Collection Time: 01/19/22  9:56 AM   Specimen: BLOOD LEFT HAND  Result Value  Ref Range Status   Specimen Description BLOOD LEFT HAND  Final   Special Requests   Final    BOTTLES DRAWN AEROBIC ONLY Blood Culture adequate volume   Culture   Final    NO GROWTH < 24 HOURS Performed at [facility], [address] [place], [place] [address]    Report Status PENDING  Incomplete     Serology:   Imaging: If present, new imagings (plain films, ct scans, and mri) have been personally visualized and interpreted; radiology reports have been reviewed. Decision making incorporated into the Impression / Recommendations.  1/18 tte  1. Left ventricular ejection fraction, by estimation, is 55 to 60%. The  left ventricle has normal function. The left ventricle has no regional  wall motion abnormalities. There is mild asymmetric left ventricular  hypertrophy of the basal-septal segment.  Left ventricular diastolic parameters are consistent with Grade I  diastolic dysfunction (impaired relaxation).   2. Right ventricular systolic function is mildly reduced. The right  ventricular size is mildly enlarged. There is moderately elevated  pulmonary artery systolic pressure. The estimated right ventricular  systolic pressure is 57.0 mmHg.   3. Left atrial size was mild to moderately dilated.   4. Right atrial size was moderately dilated.   5. The mitral valve is abnormal. Moderate mitral valve regurgitation. No  evidence of mitral stenosis. The mean mitral valve gradient is 4.0 mmHg.  Moderate mitral annular calcification.   6. Tricuspid valve regurgitation is moderate.   7. The aortic valve is grossly normal. There is mild calcification of the  aortic valve. Aortic valve regurgitation is not visualized. Aortic valve  sclerosis is present, with no evidence of aortic valve stenosis.   8. The inferior vena cava is dilated in size with <50% respiratory  variability, suggesting right atrial pressure of 15 mmHg.   Conclusion(s)/Recommendation(s): No evidence of valvular  vegetations on  this transthoracic echocardiogram. Consider a transesophageal  echocardiogram to exclude infective endocarditis if clinically indicated.  Compared to prior echo, increased RVSP.    Raymondo Band, MD [facility] [phone] pager    01/20/2022, 3:25 PM

## 2022-01-20 NOTE — Progress Notes (Incomplete)
PHARMACY ANTIBIOTIC CONSULT NOTE   Jacob [name] a 89 y.o. male admitted on *** with *** .  Pharmacy has been consulted for *** dosing.  Scr 4.02 (01/20/2022), LA 1.6 (01/17/2022), WBC 9.0 (01/20/2022)  ***: Scr 4.02, LA 1.6,  WBC 9.0  Vital Signs: Tm ***, HR ***, BP ***  Estimated Creatinine Clearance: 10.8 mL/min (A) (by C-G formula based on SCr of 4.02 mg/dL (H)).  Plan: {ABXGN:27599::"START Cefepime 2g IV Q***H"} {VancAAP:27598} {ABXmonitor:27601}  Allergies:  Allergies  Allergen Reactions   Penicillins Hives    BLISTERS    Filed Weights   01/17/22 1018  Weight: 68.9 kg (152 lb)       Latest Ref Rng & Units 01/20/2022    5:42 AM 01/19/2022    3:49 AM 01/18/2022    6:52 AM  CBC  WBC 4.0 - 10.5 K/uL 9.0  6.6  8.0   Hemoglobin 13.0 - 17.0 g/dL 7.7  8.0  8.7   Hematocrit 39.0 - 52.0 % 23.0  23.8  26.0   Platelets 150 - 400 K/uL 57  59  74     Antibiotics Given (last 72 hours)     Date/Time Action Medication Dose Rate   01/19/22 0901 New Bag/Given   ceFEPIme (MAXIPIME) 2 g in sodium chloride 0.9 % 100 mL IVPB 2 g 200 mL/hr       Antimicrobials this admission:   Microbiology results: *** Bcx: sent *** Ucx: sent  *** MRSA PCR: sent   Thank you for allowing pharmacy to be a part of this patient's care.  Jani Gravel, PharmD PGY-2 Infectious Diseases Resident  01/20/2022 10:11 AM

## 2022-01-20 NOTE — Progress Notes (Signed)
Nutrition Follow-up  DOCUMENTATION CODES:  Severe malnutrition in context of chronic illness  INTERVENTION:  Continue current diet as ordered Change to ordering assistance Nursing to staff to continue assisting with tray setup and opening packages Boost Plus TID to provide 360kcal and 14g of protein per serving MVI with minerals daily Magic cup TID with meals, each supplement provides 290 kcal and 9 grams of protein  NUTRITION DIAGNOSIS:  Severe Malnutrition related to chronic illness as evidenced by percent weight loss, severe muscle depletion, severe fat depletion. - remains applicable  GOAL:  Patient will meet greater than or equal to 90% of their needs - progressing  MONITOR:  PO intake, Labs, Weight trends, Skin, Supplement acceptance  REASON FOR ASSESSMENT:  Consult Assessment of nutrition requirement/status  ASSESSMENT:  Pt with hx of GERD, atrial fibrillation, HLD, HTN, CAD, COPD, DM type 2, hx renal cancer (s/p left nephroureterectomy), and hx bladder cancer (s/p TURBT and intravesical chemotherapy) presented to ED after a fall at home where he did hit his head.  New consult received for assessment of nutrition needs. Pt was seen on 1/17. Visited pt to follow-up on previously placed interventions.   Pt resting in bed at the time of assessment. States that he is in pain, assisted RN with adjusting pt. Pt reports that appetite is better now that he is able to order foods he likes and like the boost better than ensure.   Average Meal Intake: 1/17-1/18: 54% intake x 6 recorded meals  Nutritionally Relevant Medications: Scheduled Meds:  atorvastatin  40 mg Oral Daily   calcitonin (salmon)  1 spray Alternating Nares Daily   insulin aspart  0-9 Units Subcutaneous Q4H   lactose free nutrition  237 mL Oral TID WC   multivitamin with minerals  1 tablet Oral Daily   pantoprazole  40 mg Oral Daily   Continuous Infusions:  meropenem (MERREM) IV     PRN Meds:  senna-docusate  Labs Reviewed: BUN 82, creatinine 4.02 CBG ranges from 110-140 mg/dL over the last 24 hours  NUTRITION - FOCUSED PHYSICAL EXAM: Flowsheet Row Most Recent Value  Orbital Region Severe depletion  Upper Arm Region Severe depletion  Thoracic and Lumbar Region Moderate depletion  Buccal Region Severe depletion  Temple Region Moderate depletion  Clavicle Bone Region Severe depletion  Clavicle and Acromion Bone Region Severe depletion  Scapular Bone Region Moderate depletion  Dorsal Hand Mild depletion  Patellar Region Severe depletion  Anterior Thigh Region Severe depletion  Posterior Calf Region Mild depletion  Edema (RD Assessment) None  Hair Reviewed  Eyes Reviewed  Mouth Reviewed  Skin Reviewed  Nails Reviewed       Diet Order:   Diet Order             Diet regular Room service appropriate? Yes with Assist; Fluid consistency: Thin  Diet effective now                   EDUCATION NEEDS:  Education needs have been addressed  Skin:  Skin Assessment: Reviewed RN Assessment (laceration to the left face from fall)  Last BM:  1/15  Height:  Ht Readings from Last 1 Encounters:  01/17/22 5\' 6"  (1.676 m)    Weight:  Wt Readings from Last 1 Encounters:  01/17/22 68.9 kg    Ideal Body Weight:  64.5 kg  BMI:  Body mass index is 24.53 kg/m.  Estimated Nutritional Needs:  Kcal:  1800-2000 kcal/d Protein:  85-100g/d Fluid:  1.8-2L/d  Ranell Patrick, RD, LDN Clinical Dietitian RD pager # available in [place]  After hours/weekend pager # available in [facility]

## 2022-01-21 ENCOUNTER — Inpatient Hospital Stay (HOSPITAL_COMMUNITY): Payer: Medicare Other

## 2022-01-21 DIAGNOSIS — R7881 Bacteremia: Secondary | ICD-10-CM | POA: Diagnosis not present

## 2022-01-21 DIAGNOSIS — N179 Acute kidney failure, unspecified: Secondary | ICD-10-CM | POA: Diagnosis not present

## 2022-01-21 DIAGNOSIS — S32120A Nondisplaced Zone II fracture of sacrum, initial encounter for closed fracture: Secondary | ICD-10-CM

## 2022-01-21 DIAGNOSIS — C649 Malignant neoplasm of unspecified kidney, except renal pelvis: Secondary | ICD-10-CM

## 2022-01-21 DIAGNOSIS — S0101XA Laceration without foreign body of scalp, initial encounter: Secondary | ICD-10-CM

## 2022-01-21 DIAGNOSIS — Z515 Encounter for palliative care: Secondary | ICD-10-CM

## 2022-01-21 DIAGNOSIS — S32020A Wedge compression fracture of second lumbar vertebra, initial encounter for closed fracture: Secondary | ICD-10-CM

## 2022-01-21 DIAGNOSIS — Z950 Presence of cardiac pacemaker: Secondary | ICD-10-CM

## 2022-01-21 LAB — CBC
HCT: 25 % — ABNORMAL LOW (ref 39.0–52.0)
Hemoglobin: 8.1 g/dL — ABNORMAL LOW (ref 13.0–17.0)
MCH: 30.5 pg (ref 26.0–34.0)
MCHC: 32.4 g/dL (ref 30.0–36.0)
MCV: 94 fL (ref 80.0–100.0)
Platelets: 117 10*3/uL — ABNORMAL LOW (ref 150–400)
RBC: 2.66 MIL/uL — ABNORMAL LOW (ref 4.22–5.81)
RDW: 16.9 % — ABNORMAL HIGH (ref 11.5–15.5)
WBC: 8.2 10*3/uL (ref 4.0–10.5)
nRBC: 0 % (ref 0.0–0.2)

## 2022-01-21 LAB — CULTURE, BLOOD (ROUTINE X 2): Special Requests: ADEQUATE

## 2022-01-21 LAB — GLUCOSE, CAPILLARY
Glucose-Capillary: 108 mg/dL — ABNORMAL HIGH (ref 70–99)
Glucose-Capillary: 114 mg/dL — ABNORMAL HIGH (ref 70–99)
Glucose-Capillary: 117 mg/dL — ABNORMAL HIGH (ref 70–99)
Glucose-Capillary: 126 mg/dL — ABNORMAL HIGH (ref 70–99)
Glucose-Capillary: 127 mg/dL — ABNORMAL HIGH (ref 70–99)
Glucose-Capillary: 137 mg/dL — ABNORMAL HIGH (ref 70–99)
Glucose-Capillary: 90 mg/dL (ref 70–99)

## 2022-01-21 LAB — BASIC METABOLIC PANEL
Anion gap: 10 (ref 5–15)
BUN: 94 mg/dL — ABNORMAL HIGH (ref 8–23)
CO2: 20 mmol/L — ABNORMAL LOW (ref 22–32)
Calcium: 8.2 mg/dL — ABNORMAL LOW (ref 8.9–10.3)
Chloride: 105 mmol/L (ref 98–111)
Creatinine, Ser: 4.46 mg/dL — ABNORMAL HIGH (ref 0.61–1.24)
GFR, Estimated: 12 mL/min — ABNORMAL LOW (ref >60)
Glucose, Bld: 127 mg/dL — ABNORMAL HIGH (ref 70–99)
Potassium: 4.7 mmol/L (ref 3.5–5.1)
Sodium: 135 mmol/L (ref 135–145)

## 2022-01-21 MED ORDER — OXYCODONE HCL 5 MG PO TABS
5.0000 mg | ORAL_TABLET | Freq: Four times a day (QID) | ORAL | Status: DC
  Administered 2022-01-21: 5 mg via ORAL
  Filled 2022-01-21: qty 1

## 2022-01-21 MED ORDER — HYDROMORPHONE HCL 1 MG/ML IJ SOLN
0.5000 mg | INTRAMUSCULAR | Status: DC | PRN
  Filled 2022-01-21: qty 0.5

## 2022-01-21 MED ORDER — OXYCODONE-ACETAMINOPHEN 5-325 MG PO TABS
1.0000 | ORAL_TABLET | ORAL | Status: DC
  Administered 2022-01-21 – 2022-01-22 (×6): 1 via ORAL
  Filled 2022-01-21 (×6): qty 1

## 2022-01-21 MED ORDER — FENTANYL CITRATE PF 50 MCG/ML IJ SOSY
6.2500 ug | PREFILLED_SYRINGE | INTRAMUSCULAR | Status: DC | PRN
  Administered 2022-01-21 – 2022-01-22 (×2): 6.5 ug via INTRAVENOUS
  Filled 2022-01-21 (×2): qty 1

## 2022-01-21 MED ORDER — OXYCODONE HCL 5 MG PO TABS: 5.0000 mg | ORAL_TABLET | ORAL | Status: DC

## 2022-01-21 NOTE — Consult Note (Addendum)
Consultation Note Date: 01/21/2022 at 1030  Patient Name: Jacob [name]  DOB: 11/29/1928  MRN: [id number]  Age / Sex: 89 y.o., male  PCP: Jacob Mustache, MD Referring Physician: McDiarmid, Jacob Roach, MD  Reason for Consultation: Establishing goals of care  HPI/Patient Profile: 89 y.o. 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  PMT was consulted to discuss goals of care.   Clinical Assessment and Goals of Care: I have reviewed medical records including EPIC notes, labs and imaging, assessed the patient and then met with patient at bedside to discuss diagnosis prognosis, GOC, EOL wishes, disposition and options.  I introduced Palliative Medicine as specialized medical care for people living with serious illness. It focuses on providing relief from the symptoms and stress of a serious illness. The goal is to improve quality of life for both the patient and the family.  We discussed a brief life review of the patient.  Patient worked for [employer] "doing a little bit of everything" for over 36 years.  He has 1 son, Jacob [name], and a Museum/gallery conservator.  As far as functional and nutritional status patient endorses he was feeling well prior to his most recent fall.  He denied issues with nutritional intake or functional abilities.  We discussed patient's current illness and what it means in the larger context of patient's on-going co-morbidities.  Highlighted patient's renal cell  carcinoma, spinal fractures, possible pacer lead infection, and now AKI.  Patient endorses he is aware of all of his issues but his biggest concern is his pain.  Pain assessment completed.  Patient endorses 9 out of 10 pain in left lower back at rest.  We reviewed patient's current regimen of scheduled Tylenol and Oxy IR with addition of as needed p.o. Dilaudid.  Patient endorses that his pain is not managed with all of these medications on board. Pain is present constantly and increasing with deep breathing and coughing.    Patient is awaiting SLP eval. My recommendations are as follows:  While NPO: Fentanyl 6.58mcg Q2H PRN for pain When able to safely tolerate PO: Scheduled Oxy IR 5mg  Q4H, Fentanyl 6.47mcg Q4H for breakthrough pain  I attempted to elicit values and goals of care important to the patient. He shares he is tired and ready to be out of pain. He is sleepy and comes in and out of sleep during our discussion. He is unable to participate in medical decision making independently at this time d/t sleepiness. Capacity appears intact.   After meeting with the patient, I attempted to speak with his son Jacob [name] over the phone.  No answer.  HIPAA  compliant voicemail left.  I counseled with attending Dr. Hyacinth [name] and gave pain medication recommendations. She spoke with son earlier this morning and family plans to meet tomorrow afternoon to continue goals of care discussions.    PMT will remain available to patient and family.  I am off service tomorrow but will ask a PMT colleague to follow-up with primary team and family and assist where appropriate for ongoing GOC discussions.   Addendum at 1620: PMT received return phone call from son Jacob [name]. I attempted to speak with him again but no answer. PMT will attempt to touch base with family again tomorrow, 1/21.   Addendum at 1655: I spoke with patient's son Jacob [name] over the phone. Discussed patient's current medical situation. Reviewed multiple  co-morbidities and acute issues. Son acknowledges that his father might not get better and shares he is trying to prepare, but has not had to deal with family members at EOL before. He plans to be at the hospital tomorrow in the afternoon. My colleagues will touch base with him it the AM to confirm time for GOC discussion at bedside.   Please note that [name] has a land line so if unable to reach him at cell phone number listed he shares he can also be reached at him home number listed.   Primary Decision Maker PATIENT  Physical Exam Vitals reviewed.  Constitutional:      Appearance: He is normal weight.  HENT:     Head:     Comments: Left frontal part of scalp -UTA - dressing in place - clean, dry intact Eyes:     Pupils: Pupils are equal, round, and reactive to light.  Cardiovascular:     Rate and Rhythm: Normal rate.     Pulses: Normal pulses.  Abdominal:     Palpations: Abdomen is soft.  Musculoskeletal:     Comments: Generalized weakness  Skin:    General: Skin is warm.     Coloration: Skin is pale.  Neurological:     Mental Status: He is alert and oriented to person, place, and time.  Psychiatric:        Mood and Affect: Mood normal.        Behavior: Behavior normal.        Thought Content: Thought content normal.        Judgment: Judgment normal.     Palliative Assessment/Data: 30-40%     Thank you for this consult. Palliative medicine will continue to follow and assist holistically.   Time Total: 75 minutes Greater than 50%  of this time was spent counseling and coordinating care related to the above assessment and plan.  Signed by: Jacob Cocker, DNP, FNP-BC Palliative Medicine    Please contact Palliative Medicine Team phone at [phone] for questions and concerns.  For individual provider: See Loretha Stapler

## 2022-01-21 NOTE — Progress Notes (Signed)
Pt refused to turn be at all today. Pt educated about the risk of pressure injuries and pt voiced understanding, but he still did not want to be turned.

## 2022-01-21 NOTE — Progress Notes (Addendum)
Daily Progress Note Intern Pager: [phone]  Patient name: Jacob [name] Medical record number: [id number] Date of birth: [birth year] Age: 89 y.o. Gender: male  Primary Care Provider: Sherrie Mustache, MD Consultants: ID, Nephrology, EP Code Status: Full code, need to discuss GOC  [name]: Admitted  1/18: Blood cx 24 hr result: pseudomonas 1/19: Worsening Cr, Nephrology Consulted   Assessment and Plan:  Jacob [name] is a 89 y.o. male with PMHx renal cell carcinoma with recent reoccurrence, COPD with no O2 at baseline, hyperlipidemia, advanced AV block s/p DDD PPM 2015, NSVT, PVCs, excised infiltrative basal cell carcinoma on the face presenting with fall without loss of consciousness but did hit head during the process.    Worsening AKI most likely prerenal 2/2 to hypotension/sepsis.  Nephrology consulted yesterday, patient is not a candidate for dialysis with multiple comorbidities and age.  Will need continued goals of care discussion.  Has remained afebrile since 1/17.  ID on board, continuing meropenem.  Awaiting TEE to evaluate pacer leads for infection and need for lifelong antibiotics.  Due to patient's, comorbidities and age he will not be a candidate for pacer removal if infected.  Again, will need goals of care discussion. Will reach out to family about consulting palliative care and will attempt to arrange a family meeting. Patient in agreement with plan and is understanding in his prognosis.   Appeared to be aspirating with liquids this morning at bedside. Ordering SLP eval and making patient NPO. Will also order a CXR. Appears to be stable on 2 L LFNC at this time, will monitor respiratory status.   * Bacteremia Afebrile, T max of 100.4 on 1/17. VS stable. TTE WNL 1/18.  - s/p cefepime (1/17 - 1/18) - Cont meropenem (1/19 - )  - ID consulted, appreciate recommendations  - pending TEE, EP on board due to pacemaker  - monitor  fever curve   Acute kidney injury superimposed on chronic kidney disease (HCC) Cr 4.02>4.46; 750 cc output (1/19). Bladder scans negative for retention. Renal U/s neg for hydronephrosis. Most likely prerenal AKI 2/2 hypotension/sepsis  - consult nephrology, appreciate help with care - cont bladder scans Q6H  - Strict I/Os  - daily BMP  - Needs continued GOC discussion, not a candidate for dialysis   Thrombocytopenia (HCC) Improving, 117 (1/20); DIC panel: d-dimer 6.31; INR 1.3 - Lovenox held (1/19), restart today   Lumbar compression fracture (HCC) Working with PT. 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  - strict I&Os - Follow-up with urology outpatient - continue GOC discussions  - consider consulting palliative   V tach (HCC) Stable - Amiodarone 400 mg p.o. BID (1/17 - 1/20) - Start Amiodarone 200 mg BID (1/21 - 1/27) - Start Amiodarone 200 mg daily (1/28)   FEN/GI: NPO, pending SLP eval for swallowing  PPx: Lovenox  Dispo:SNF pending clinical improvement . Barriers include ongoing medical workup for bacteremia.   Subjective:  NAEO, looks uncomfortable on exam.    Objective: Temp:  [97.7 F (36.5 C)-98.7 F (37.1 C)] 98.4 F (36.9 C) (01/20 0728) Pulse Rate:  [59-69] 60 (01/20  3313) Resp:  [14-25] 19 (01/20 0728) BP: (93-122)/(37-51) 121/51 (01/20 0728) SpO2:  [93 %-100 %] 94 % (01/20 0728) Physical Exam: Acute on chronically ill-appearing, no acute distress, uncomfortable  Cardio: Regular rate, regular rhythm, no murmurs on exam. Pulm: coarse breath sounds, crackles appreciated bilaterally; productive cough Abdominal: bowel sounds present, soft,  non-tender, non-distended Extremities: no peripheral edema  Neuro: alert and oriented x3, speech normal in content, no facial asymmetry  Laboratory: Most recent CBC Lab Results  Component Value Date   WBC 8.2 01/21/2022   HGB 8.1 (L) 01/21/2022   HCT 25.0 (L) 01/21/2022   MCV 94.0 01/21/2022   PLT 117 (L) 01/21/2022   Most recent BMP    Latest Ref Rng & Units 01/21/2022    6:48 AM  BMP  Glucose 70 - 99 mg/dL 438   BUN 8 - 23 mg/dL 94   Creatinine 8.17 - 1.24 mg/dL 9.10   Sodium 586 - 100 mmol/L 135   Potassium 3.5 - 5.1 mmol/L 4.7   Chloride 98 - 111 mmol/L 105   CO2 22 - 32 mmol/L 20   Calcium 8.9 - 10.3 mg/dL 8.2    [name], DO 01/21/2022, 8:14 AM  PGY-1, [facility] Family Medicine FPTS Intern pager: [phone], text pages welcome Secure chat group [facility] Teaching Service

## 2022-01-21 NOTE — Progress Notes (Addendum)
FMTS Interim Progress Note  Spoke with patient's son.  Outlined patient's multiple chronic illnesses, worsening kidney function, infection, cancer, and spinal fractures.  Also discussed specialist recommendations: Unable to do dialysis due to chronic comorbidities and age, unable to do surgery if pacemaker is infected due to chronic comorbidities and age.  Son understands high disease burden and poor prognosis at this point.    Discussed that "life prolonging" interventions may not lead to the best quality of life for his father and that comfort care may be beneficial in this case.  Jacob [name]. asked how long we thought his father had to live, I reviewed with him his father's high disease burden and current medical conditions and suspect he likely has weeks to months - which may be a generous estimation.  Discussed the option of having palliative care consulted to help lead family discussion.  Son is in agreement and will speak with his sister who is the other participating family member in Jacob [name] care.  They would like to set up a goals of care meeting tomorrow afternoon with palliative care.  Discussed family meeting with patient this morning, and he is in agreement with plan.   Provided support to son and allowed for questions.  Son understandably upset with prognosis but willing to engage in conversation.  Reassured him that we will do our best to take care of his father while he is in our care.  [name], DO 01/21/2022, 9:36 AM PGY-1, [facility] Family Medicine Service pager [phone]

## 2022-01-21 NOTE — Progress Notes (Signed)
Mobility Specialist Progress Note:   01/21/22 1006  Mobility  Activity  (Bed Level Exercises)  Range of Motion/Exercises Active;Passive  Activity Response Tolerated poorly  Mobility Referral Yes  $Mobility charge 1 Mobility   Pt received in bed and agreeable with Max encouragement. Received pain medication before session but still c/o severe pain throughout. Pt deferred further mobility and limited by pain. Pt left laying in bed with all needs met, call bell in reach, and bed alarm on.   Daine Gravel Mobility Specialist Please contact via SecureChat or  Rehab office at [phone]

## 2022-01-21 NOTE — Progress Notes (Signed)
Patient ID: Jacob [name], male   DOB: [birth year], 89 y.o.   MRN: [id number] S: Pt reports that he "feels terrible and I am ready to die" O:BP (!) 121/51 (BP Location: Right Arm)   Pulse 60   Temp 98.4 F (36.9 C) (Oral)   Resp 19   Ht 5\' 6"  (1.676 m)   Wt 68.9 kg   SpO2 98%   BMI 24.53 kg/m   Intake/Output Summary (Last 24 hours) at 01/21/2022 1048 Last data filed at 01/21/2022 0600 Gross per 24 hour  Intake 987 ml  Output 500 ml  Net 487 ml   Intake/Output: I/O last 3 completed shifts: In: 987 [P.O.:887; IV Piggyback:100] Out: 750 [Urine:750]  Intake/Output this shift:  No intake/output data recorded. Weight change:  Gen: elderly WM lying in bed in mild distress CVS: RRR Resp:CTA  Abd:+BS, soft, NT/ND Ext: no edema  Recent Labs  Lab 01/17/22 0708 01/18/22 0652 01/19/22 0349 01/20/22 0542 01/21/22 0648  NA 134* 134* 133* 134* 135  K 4.4 4.4 4.0 4.5 4.7  CL 98 103 102 104 105  CO2 23 22 22  19* 20*  GLUCOSE 176* 121* 141* 116* 127*  BUN 51* 56* 70* 82* 94*  CREATININE 2.49* 2.65* 3.19* 4.02* 4.46*  CALCIUM 9.0 8.4* 7.9* 7.9* 8.2*   Liver Function Tests: No results for input(s): "AST", "ALT", "ALKPHOS", "BILITOT", "PROT", "ALBUMIN" in the last 168 hours. No results for input(s): "LIPASE", "AMYLASE" in the last 168 hours. No results for input(s): "AMMONIA" in the last 168 hours. CBC: Recent Labs  Lab 01/17/22 0708 01/17/22 1830 01/18/22 0652 01/19/22 0349 01/20/22 0542 01/20/22 1154 01/21/22 0648  WBC 9.5  --  8.0 6.6 9.0  --  8.2  HGB 7.7*   < > 8.7* 8.0* 7.7*  --  8.1*  HCT 23.9*   < > 26.0* 23.8* 23.0*  --  25.0*  MCV 96.8  --  91.9 91.5 92.0  --  94.0  PLT 107*  --  74* 59* 57* 71* 117*   < > = values in this interval not displayed.   Cardiac Enzymes: No results for input(s): "CKTOTAL", "CKMB", "CKMBINDEX", "TROPONINI" in the last 168 hours. CBG: Recent Labs  Lab 01/20/22 1543 01/20/22 2039 01/21/22 0001 01/21/22 0406 01/21/22 0803   GLUCAP 135* 155* 137* 108* 114*    Iron Studies: No results for input(s): "IRON", "TIBC", "TRANSFERRIN", "FERRITIN" in the last 72 hours. Studies/Results: 01/23/22 RENAL  Result Date: 01/20/2022 CLINICAL DATA:  AKI. Status post left nephrectomy. Per technologist note, limited examination due to limitations in patient positioning due to thoracic spine brace. EXAM: RENAL / URINARY TRACT ULTRASOUND COMPLETE COMPARISON:  CT abdomen and pelvis without contrast October 19, 2021 FINDINGS: Right Kidney: Renal measurements: 8.3 x 5.0 x 4.1 cm = volume: 89.4 mL. Echogenicity within normal limits. No mass or hydronephrosis visualized. Left Kidney: The left kidney is surgically absent. Bladder: Appears normal for degree of bladder distention. Other: None. IMPRESSION: No right nephrolithiasis or hydronephrosis. Electronically Signed   By: 01/22/2022 M.D.   On: 01/20/2022 11:11    acetaminophen  650 mg Oral Q6H   Or   acetaminophen  650 mg Rectal Q6H   amiodarone  400 mg Oral BID   Followed by   Jacob Moores ON 01/22/2022] amiodarone  200 mg Oral BID   Followed by   Melene Muller ON 01/29/2022] amiodarone  200 mg Oral Daily   atorvastatin  40 mg Oral Daily   calcitonin (salmon)  1  spray Alternating Nares Daily   insulin aspart  0-9 Units Subcutaneous Q4H   lactose free nutrition  237 mL Oral TID WC   latanoprost  1 drop Both Eyes QHS   mometasone-formoterol  2 puff Inhalation BID   And   umeclidinium bromide  1 puff Inhalation Daily   multivitamin with minerals  1 tablet Oral Daily   mouth rinse  15 mL Mouth Rinse 4 times per day   oxyCODONE  2.5 mg Oral QID   pantoprazole  40 mg Oral Daily    BMET    Component Value Date/Time   NA 135 01/21/2022 0648   NA 137 07/07/2011 1534   K 4.7 01/21/2022 0648   K 3.8 07/07/2011 1534   CL 105 01/21/2022 0648   CL 100 07/07/2011 1534   CO2 20 (L) 01/21/2022 0648   CO2 29 07/07/2011 1534   GLUCOSE 127 (H) 01/21/2022 0648   GLUCOSE 115 (H) 07/07/2011 1534   BUN 94  (H) 01/21/2022 0648   BUN 17 07/07/2011 1534   CREATININE 4.46 (H) 01/21/2022 0648   CREATININE 1.40 (H) 07/07/2011 1534   CALCIUM 8.2 (L) 01/21/2022 0648   CALCIUM 8.7 07/07/2011 1534   GFRNONAA 12 (L) 01/21/2022 0648   GFRNONAA 47 (L) 07/07/2011 1534   GFRAA 38 (L) 03/27/2015 1614   GFRAA 55 (L) 07/07/2011 1534   CBC    Component Value Date/Time   WBC 8.2 01/21/2022 0648   RBC 2.66 (L) 01/21/2022 0648   HGB 8.1 (L) 01/21/2022 0648   HGB 13.7 07/07/2011 1534   HCT 25.0 (L) 01/21/2022 0648   HCT 41.6 07/07/2011 1534   PLT 117 (L) 01/21/2022 0648   PLT 226 07/07/2011 1534   MCV 94.0 01/21/2022 0648   MCV 97 07/07/2011 1534   MCH 30.5 01/21/2022 0648   MCHC 32.4 01/21/2022 0648   RDW 16.9 (H) 01/21/2022 0648   RDW 13.6 07/07/2011 1534   LYMPHSABS 1.0 10/24/2021 0545   MONOABS 0.4 10/24/2021 0545   EOSABS 0.1 10/24/2021 0545   BASOSABS 0.0 10/24/2021 0545    Assessment/Plan:  AKI/CKD stage IIIb-IV, oliguric - likely ischemic ATN following hypotension with VT and sepsis in setting of solitary kidney.  Renal US without evidence of hydronephrosis of solitary right kidney and bladder scans negative for urinary retention.  I discussed with him the severity of his CKD at baseline and discussed RRT.  He is not a candidate for long-term HD given his advanced age, multiple co-morbidities and poor functional and nutritional status.  He is open to comfort care if his renal function continues to decline.  Thankfully no urgent indication for dialysis and would likely require CRRT given his hypotensive episodes.  Would recommend palliative care consult to help set goals/limits of care.   Avoid nephrotoxic medications including NSAIDs and iodinated intravenous contrast exposure unless the latter is absolutely indicated.   Preferred narcotic agents for pain control are hydromorphone, fentanyl, and methadone. Morphine should not be used.  Avoid Baclofen and avoid oral sodium phosphate and magnesium  citrate based laxatives / bowel preps.  Continue strict Input and Output monitoring.  Will monitor the patient closely with you and intervene or adjust therapy as indicated by changes in clinical status/labs   Pseudomonas bacteremia - Urosepsis currently on IV cefepime.  TTE negative for vegetations.  ID following and awaiting TEE. Ventricular tachycardia, sustained - improved with amiodarone, EP following. 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  EP following. Anemia  of CKD stage IV - transfuse prn Thrombocytopenia - DIC panel ordered, off of lovenox. 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 Lumbar compression fracture - TLSO brace in place. HTN - low Bp's and home meds held. Disposition - poor overall prognosis and pt open to comfort care.  Dr. Hyacinth Meeker has discussed with his family and plan for Palliative care meeting tomorrow to set goals/limits of care.   Irena Cords, MD [facility]

## 2022-01-21 NOTE — Evaluation (Signed)
Clinical/Bedside Swallow Evaluation Patient Details  Name: Jacob [name] MRN: [id number] Date of Birth: [birth year]  Today's Date: 01/21/2022 Time: SLP Start Time (ACUTE ONLY): 1400 SLP Stop Time (ACUTE ONLY): 1419 SLP Time Calculation (min) (ACUTE ONLY): 19 min  Past Medical History:  Past Medical History:  Diagnosis Date   Acute and chronic respiratory failure with hypoxia (HCC) 03/28/2015   Anemia    Arthritis of both hands    Bladder carcinoma (HCC) 03/28/2015   BPH (benign prostatic hypertrophy)    Closed compression fracture of L4 lumbar vertebra, initial encounter (HCC) 01/26/2021   COPD (chronic obstructive pulmonary disease) (HCC)    Coronary artery disease    a.) LHC 11/18/2004: 40% pLAD, 50% mLAD, 80% D1, 50% mLCx, 100% mRCA - refer to [facility] for PCI; b.) LHC/PCI 11/21/2004: 100% mRCA (3.5 x 23 mm Cypher DES), 95% dRCA (2.5 x 28 mm Cypher); b.) 05/03/12 Cardiac CT: patent RCA stents, nonobs dzs;  c.) 01/2013 Ex MV: nl EF, no ischemia.   Diastolic dysfunction    a.) TTE 08/29/2013: EF 60-65%, mild LVH, mild BAE, mild MR, mod TR, G1DD; b.) TTE 07/01/2020: EF 50-55%, LAEm triv MR, mild-mod Ao sclerosis with no stenosis, G2DD   Dyspnea    GERD (gastroesophageal reflux disease)    Glaucoma of both eyes    H/O hiatal hernia    History of atrial fibrillation 2006   History of diverticulitis    History of kidney stones    History of renal cell carcinoma    a.) s/p LEFT nephroureterectomy   Hyperlipidemia    Hypertension    Inadequate pain control 10/18/2021   LBBB (left bundle branch block)    Left bundle branch block 02/14/2013   Mobitz type 2 second degree AV block    a.) s/p PPM placement 08/29/2013   Nodule of left lung 05/07/2016   Orthostatic hypotension 10/28/2021   Pneumonia    Presence of permanent cardiac pacemaker    Recurrent bladder transitional cell carcinoma (HCC)    a.) s/p TURBT and intravesical chemotherapy   Renal insufficiency    a. Creat rose to 2.34  post-op L nephrectomy.   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   Past Surgical History:  Past Surgical History:  Procedure Laterality Date   APPENDECTOMY  02/03/1988   BLADDER INSTILLATION N/A 10/17/2021   Procedure: BLADDER INSTILLATION OF GEMCITABINE;  Surgeon: Vanna Scotland, MD;  Location: [facility];  Service: Urology;  Laterality: N/A;   BLADDER INSTILLATION N/A 01/09/2022   Procedure: BLADDER INSTILLATION OF GEMCITABINE;  Surgeon: Vanna Scotland, MD;  Location: [facility];  Service: Urology;  Laterality: N/A;   CATARACT EXTRACTION W/ INTRAOCULAR LENS  IMPLANT, BILATERAL  01/02/2009   COLONOSCOPY     CORONARY ANGIOPLASTY WITH STENT PLACEMENT  11/21/2004   Procedure: CORONARY ANGIOPLASTY WITH STENT PLACEMENT (mRCA and dRCA); Location: [facility]; Surgeon: Renne Musca, MD   CYSTOSCOPY W/ RETROGRADES Right 01/13/2014   Procedure: CYSTOSCOPY WITH RETROGRADE PYELOGRAM;  Surgeon: Sebastian Ache, MD;  [facility];  Service: Urology;  Laterality: Right;   CYSTOSCOPY W/ URETERAL STENT PLACEMENT Left 08/22/2013   Procedure: CYSTOSCOPY WITH RETROGRADE PYELOGRAM/URETERAL STENT PLACEMENT;  Surgeon: Sebastian Ache, MD;  Location: [facility];  Service: Urology;  Laterality: Left;   CYSTOSCOPY W/ URETERAL STENT PLACEMENT Right 10/17/2021   Procedure: CYSTOSCOPY WITH RETROGRADE PYELOGRAM/URETERAL STENT PLACEMENT;  Surgeon: Vanna Scotland, MD;  Location: [facility];  Service: Urology;  Laterality: Right;   CYSTOSCOPY WITH BIOPSY  04/13/2011   Procedure: CYSTOSCOPY WITH BIOPSY;  Surgeon: Kathi Ludwig, MD;  Location: [facility];  Service: Urology;  Laterality: N/A;  Cold  Cup Biopsy   CYSTOSCOPY WITH FULGERATION Right 01/09/2022   Procedure: CYSTOSCOPY WITH LASER FULGERATION OF URETERAL TUMOR;  Surgeon: Vanna Scotland, MD;  Location: [facility];  Service: Urology;  Laterality: Right;   CYSTOSCOPY WITH RETROGRADE PYELOGRAM, URETEROSCOPY AND STENT PLACEMENT Left 06/19/2013   Procedure: CYSTOSCOPY WITH LEFT RETROGRADE PYELOGRAM, LEFT URETEROSCOPY, ureteral balloon dilatation, BIOPSY LEFT KIDNEY;  Surgeon: Kathi Ludwig, MD;  Location: [facility];  Service: Urology;  Laterality: Left;   CYSTOSCOPY/URETEROSCOPY/HOLMIUM LASER/STENT PLACEMENT Right 01/09/2022   Procedure: CYSTOSCOPY/URETEROSCOPY/HOLMIUM LASER/STENT EXCHANGE;  Surgeon: Vanna Scotland, MD;  Location: [facility];  Service: Urology;  Laterality: Right;   EYE SURGERY     INGUINAL HERNIA REPAIR Left 08/22/2013   Procedure: LAPAROSCOPIC INGUINAL HERNIA;  Surgeon: Sebastian Ache, MD;  Location: [facility];  Service: Urology;  Laterality: Left;   INSERT / REPLACE / REMOVE PACEMAKER     IR KYPHO LUMBAR INC FX REDUCE BONE BX UNI/BIL CANNULATION INC/IMAGING  01/31/2021   IR KYPHO LUMBAR INC FX REDUCE BONE BX UNI/BIL CANNULATION INC/IMAGING  10/24/2021   LEFT HEART CATH AND CORONARY ANGIOGRAPHY Left 11/18/2004   Procedure: LEFT HEART CATH AND CORONARY ANGIOGRAPHY; Location: [facility]; Surgeon: Adrian Blackwater, MD   PERMANENT PACEMAKER INSERTION N/A 08/29/2013   Procedure: PERMANENT PACEMAKER INSERTION;  Surgeon: Marinus Maw, MD;  Location: [facility];  Service: Cardiovascular;  Laterality: N/A;  Medtronic dual-chamber   ROBOT ASSITED LAPAROSCOPIC NEPHROURETERECTOMY Left 08/22/2013   Procedure: ROBOT ASSITED LAPAROSCOPIC NEPHROURETERECTOMY;  Surgeon: Sebastian Ache, MD;  Location: [facility];  Service: Urology;  Laterality: Left;   TRANSURETHRAL RESECTION OF BLADDER TUMOR  12/22/2010   TRANSURETHRAL RESECTION OF BLADDER TUMOR  08/04/2011   Procedure: TRANSURETHRAL RESECTION OF BLADDER TUMOR (TURBT);  Surgeon:  Kathi Ludwig, MD;  [facility];  Service: Urology;  Laterality: N/A;   TRANSURETHRAL RESECTION OF BLADDER TUMOR N/A 01/13/2014   Procedure: TRANSURETHRAL RESECTION OF BLADDER TUMOR (TURBT);  Surgeon: Sebastian Ache, MD;  Location: [facility];  Service: Urology;  Laterality: N/A;   TRANSURETHRAL RESECTION OF BLADDER TUMOR  10/17/2021   Procedure: TRANSURETHRAL RESECTION OF BLADDER TUMOR (TURBT);  Surgeon: Vanna Scotland, MD;  Location: [facility];  Service: Urology;;   URETERAL BIOPSY  10/17/2021   Procedure: URETERAL BIOPSY;  Surgeon: Vanna Scotland, MD;  Location: [facility];  Service: Urology;;   URETEROSCOPY  10/17/2021   Procedure: URETEROSCOPY;  Surgeon: Vanna Scotland, MD;  Location: [facility];  Service: Urology;;   HPI:  89 y.o. male presenting with fall without loss of consciousness but did hit head during the process. CT head without changes. L2 compression fx and left-sided sacral fx. Per MD note (1/20), "Appeared to be aspirating with liquids this morning at bedside". CXR 1/20 revealed "Cardiomegaly with interstitial pulmonary edema. Interval progression of bibasilar collapse/consolidative disease with persistent small bilateral pleural effusions". PMH: GERD, renal cell carcinoma with recent reoccurrence, COPD, hyperlipidemia, advanced AV block s/p DDD PPM 2015, NSVT, PVCs, excised infiltrative basal cell carcinoma on the face.    Assessment / Plan / Recommendation  Clinical Impression  Pt with frequent congested coughing, without presence of POs, upon SLP arrival for clinical swallow eval. Pt reported no difficulties swallowing. Oral care provided via suction brush and upper/lower dentures placed. Oral mechanism examination unremarkable. He consumed straw/cup sips of thin liquids, bites of puree and regular textures with intermittent, but inconsistent congested coughing throughout. When cued for small sips by cup/straw, presentation improved  and was eliminated in majority of trials. Intermittent coughing continued minutes post intake. Will initiate regular diet/thin liquids with adherence to swallow precautions (upright positioning, small bites/sips, slow rate).  At this time, do not suspect overt coughing is related to swallow function, however SLP will continue to f/u for differential dx.  SLP Visit Diagnosis: Dysphagia, unspecified (R13.10)    Aspiration Risk       Diet Recommendation Regular;Thin liquid   Liquid Administration via: Cup;Straw Medication Administration: Whole meds with liquid (or puree as tolerrated) Supervision: Staff to assist with self feeding;Full supervision/cueing for compensatory strategies Compensations: Slow rate;Small sips/bites Postural Changes: Seated upright at 90 degrees;Remain upright for at least 30 minutes after po intake    Other  Recommendations Oral Care Recommendations: Oral care BID    Recommendations for follow up therapy are one component of a multi-disciplinary discharge planning process, led by the attending physician.  Recommendations may be updated based on patient status, additional functional criteria and insurance authorization.  Follow up Recommendations Other (comment) (TBD)      Assistance Recommended at Discharge    Functional Status Assessment Patient has had a recent decline in their functional status and demonstrates the ability to make significant improvements in function in a reasonable and predictable amount of time.  Frequency and Duration min 2x/week  2 weeks       Prognosis Prognosis for Safe Diet Advancement: Fair Barriers to Reach Goals: Motivation      Swallow Study   General Date of Onset: 01/21/22 HPI: 89 y.o. male presenting with fall without loss of consciousness but did hit head during the process. CT head without changes. L2 compression fx and left-sided sacral fx. Per MD note (1/20), "Appeared to be aspirating with liquids this morning at  bedside". CXR 1/20 revealed "Cardiomegaly with interstitial pulmonary edema. Interval progression of bibasilar collapse/consolidative disease with persistent small bilateral pleural effusions". PMH: GERD, renal cell carcinoma with recent reoccurrence, COPD, hyperlipidemia, advanced AV block s/p DDD PPM 2015, NSVT, PVCs, excised infiltrative basal cell carcinoma on the face. Type of Study: Bedside Swallow Evaluation Previous Swallow Assessment: none per EMR Diet Prior to this Study: NPO Temperature Spikes Noted: No Respiratory Status: Nasal cannula History of Recent Intubation: No Behavior/Cognition: Alert;Cooperative;Pleasant mood Oral Cavity Assessment: Dried secretions Oral Care Completed by SLP: Yes Oral Cavity - Dentition: Dentures, bottom;Dentures, top Vision: Functional for self-feeding Self-Feeding Abilities: Able to feed self;Needs assist Patient Positioning: Upright in bed;Postural control adequate for testing Baseline Vocal Quality: Normal Volitional Cough: Strong;Congested Volitional Swallow: Able to elicit    Oral/Motor/Sensory Function Overall Oral Motor/Sensory Function: Within functional limits   Ice Chips Ice chips: Not tested   Thin Liquid Thin Liquid: Impaired Presentation: Cup;Straw;Self Fed Pharyngeal  Phase Impairments: Cough - Immediate;Cough - Delayed    Nectar Thick Nectar Thick Liquid: Not tested   Honey Thick Honey Thick Liquid: Not tested   Puree Puree: Impaired Presentation: Self Fed;Spoon Pharyngeal Phase Impairments: Cough - Immediate;Cough - Delayed   Solid     Solid: Within functional limits Presentation: Self Fed       Avie Echevaria,  MA, CCC-SLP Acute Rehabilitation Services Office Number: [phone]  Paulette Blanch 01/21/2022,2:45 PM

## 2022-01-22 DIAGNOSIS — Z515 Encounter for palliative care: Secondary | ICD-10-CM | POA: Diagnosis not present

## 2022-01-22 DIAGNOSIS — Z7189 Other specified counseling: Secondary | ICD-10-CM | POA: Diagnosis not present

## 2022-01-22 DIAGNOSIS — N179 Acute kidney failure, unspecified: Secondary | ICD-10-CM | POA: Diagnosis not present

## 2022-01-22 DIAGNOSIS — R7881 Bacteremia: Secondary | ICD-10-CM | POA: Diagnosis not present

## 2022-01-22 DIAGNOSIS — E119 Type 2 diabetes mellitus without complications: Secondary | ICD-10-CM

## 2022-01-22 LAB — CBC
HCT: 24.8 % — ABNORMAL LOW (ref 39.0–52.0)
Hemoglobin: 8.2 g/dL — ABNORMAL LOW (ref 13.0–17.0)
MCH: 30.6 pg (ref 26.0–34.0)
MCHC: 33.1 g/dL (ref 30.0–36.0)
MCV: 92.5 fL (ref 80.0–100.0)
Platelets: 155 10*3/uL (ref 150–400)
RBC: 2.68 MIL/uL — ABNORMAL LOW (ref 4.22–5.81)
RDW: 17.1 % — ABNORMAL HIGH (ref 11.5–15.5)
WBC: 6.1 10*3/uL (ref 4.0–10.5)
nRBC: 0 % (ref 0.0–0.2)

## 2022-01-22 LAB — GLUCOSE, CAPILLARY
Glucose-Capillary: 104 mg/dL — ABNORMAL HIGH (ref 70–99)
Glucose-Capillary: 92 mg/dL (ref 70–99)
Glucose-Capillary: 111 mg/dL — ABNORMAL HIGH (ref 70–99)
Glucose-Capillary: 141 mg/dL — ABNORMAL HIGH (ref 70–99)

## 2022-01-22 LAB — BASIC METABOLIC PANEL
Anion gap: 11 (ref 5–15)
BUN: 100 mg/dL — ABNORMAL HIGH (ref 8–23)
CO2: 21 mmol/L — ABNORMAL LOW (ref 22–32)
Calcium: 8.6 mg/dL — ABNORMAL LOW (ref 8.9–10.3)
Chloride: 106 mmol/L (ref 98–111)
Creatinine, Ser: 4.74 mg/dL — ABNORMAL HIGH (ref 0.61–1.24)
GFR, Estimated: 11 mL/min — ABNORMAL LOW (ref >60)
Glucose, Bld: 95 mg/dL (ref 70–99)
Potassium: 5.1 mmol/L (ref 3.5–5.1)
Sodium: 138 mmol/L (ref 135–145)

## 2022-01-22 MED ORDER — SODIUM CHLORIDE 0.9 % IV SOLN
500.0000 mg | Freq: Every day | INTRAVENOUS | Status: DC
  Administered 2022-01-23: 500 mg via INTRAVENOUS
  Filled 2022-01-22: qty 10

## 2022-01-22 MED ORDER — ONDANSETRON HCL 4 MG/2ML IJ SOLN: 4.0000 mg | Freq: Four times a day (QID) | INTRAMUSCULAR | Status: DC | PRN

## 2022-01-22 MED ORDER — HALOPERIDOL LACTATE 2 MG/ML PO CONC: 0.5000 mg | ORAL | Status: DC | PRN

## 2022-01-22 MED ORDER — ACETAMINOPHEN 650 MG RE SUPP: 650.0000 mg | Freq: Four times a day (QID) | RECTAL | Status: DC | PRN

## 2022-01-22 MED ORDER — HALOPERIDOL LACTATE 5 MG/ML IJ SOLN: 0.5000 mg | INTRAMUSCULAR | Status: DC | PRN

## 2022-01-22 MED ORDER — ONDANSETRON 4 MG PO TBDP: 4.0000 mg | ORAL_TABLET | Freq: Four times a day (QID) | ORAL | Status: DC | PRN

## 2022-01-22 MED ORDER — SODIUM CHLORIDE 0.9 % IV SOLN: INTRAVENOUS | Status: DC

## 2022-01-22 MED ORDER — GLYCOPYRROLATE 1 MG PO TABS: 1.0000 mg | ORAL_TABLET | ORAL | Status: DC | PRN

## 2022-01-22 MED ORDER — OXYCODONE HCL 5 MG PO TABS
10.0000 mg | ORAL_TABLET | ORAL | Status: DC
  Administered 2022-01-22 – 2022-01-23 (×5): 10 mg via ORAL
  Filled 2022-01-22 (×5): qty 2

## 2022-01-22 MED ORDER — FENTANYL CITRATE PF 50 MCG/ML IJ SOSY
12.5000 ug | PREFILLED_SYRINGE | INTRAMUSCULAR | Status: DC | PRN
  Administered 2022-01-22: 12.5 ug via INTRAVENOUS
  Filled 2022-01-22: qty 1

## 2022-01-22 MED ORDER — ACETAMINOPHEN 325 MG PO TABS: 650.0000 mg | ORAL_TABLET | Freq: Four times a day (QID) | ORAL | Status: DC | PRN

## 2022-01-22 MED ORDER — ENOXAPARIN SODIUM 30 MG/0.3ML IJ SOSY
30.0000 mg | PREFILLED_SYRINGE | INTRAMUSCULAR | Status: DC
  Administered 2022-01-22: 30 mg via SUBCUTANEOUS
  Filled 2022-01-22: qty 0.3

## 2022-01-22 MED ORDER — GLYCOPYRROLATE 0.2 MG/ML IJ SOLN: 0.2000 mg | INTRAMUSCULAR | Status: DC | PRN

## 2022-01-22 MED ORDER — HALOPERIDOL 0.5 MG PO TABS: 0.5000 mg | ORAL_TABLET | ORAL | Status: DC | PRN

## 2022-01-22 MED ORDER — POLYVINYL ALCOHOL 1.4 % OP SOLN: 1.0000 [drp] | Freq: Four times a day (QID) | OPHTHALMIC | Status: DC | PRN

## 2022-01-22 MED ORDER — BIOTENE DRY MOUTH MT LIQD: 15.0000 mL | OROMUCOSAL | Status: DC | PRN

## 2022-01-22 MED ORDER — INSULIN ASPART 100 UNIT/ML IJ SOLN: 0.0000 [IU] | Freq: Three times a day (TID) | INTRAMUSCULAR | Status: DC

## 2022-01-22 NOTE — Progress Notes (Addendum)
Daily Progress Note Intern Pager: [phone]  Patient name: Jacob [name] Medical record number: [id number] Date of birth: [birth year] Age: 89 y.o. Gender: male  Primary Care Provider: Sherrie Mustache, MD Consultants: ID, Nephro, EP, Palliative  Code Status: Full code  [name]: Admitted  1/18: Blood cx 24 hr result: pseudomonas 1/19: Worsening Cr, Nephrology Consulted   Assessment and Plan: Jacob [name] is a 89 y.o. male with PMHx renal cell carcinoma with recent reoccurrence, COPD with no O2 at baseline, hyperlipidemia, advanced AV block s/p DDD PPM 2015, NSVT, PVCs, excised infiltrative basal cell carcinoma on the face presenting with fall without loss of consciousness but did hit head during the process.    * Bacteremia Remains afebrile.  - s/p cefepime (1/17 - 1/18) - Cont meropenem (1/19 - )  - ID consulted, appreciate recommendations  - pending TEE, EP on board due to pacemaker  - monitor fever curve   Acute kidney injury superimposed on chronic kidney disease (HCC) Poor candidate for dialysis, pending GOC discussion - consult nephrology, appreciate help with care - cont bladder scans Q6H  - Strict I/Os  - daily BMP   Lumbar compression fracture (HCC) Pain regimen changed per Palliative, pt reports improved pain control but still in a lot of pain. Pain regimen:  - Sch oxy/acetaminphen q4h  - prn Fentanyl 6.63mcg q2h (pt hasn't used yet, encouraged pt to ask for his prn meds) Working with PT. 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  - strict I&Os - Follow-up with urology outpatient - continue GOC discussions   Thrombocytopenia (HCC)-resolved as of 01/22/2022 Plt wnl today  V tach (HCC) Stable - Amiodarone 400 mg p.o. BID (1/17 - 1/20) - Start Amiodarone 200 mg  BID (1/21 - 1/27) - Start Amiodarone 200 mg daily (1/28)  Counseling regarding goals of care Plan for family meeting this afternoon with son and daughter to discuss GOC - Palliative consulted, appreciate recs  Type 2 diabetes mellitus without complication, without long-term current use of insulin (HCC) Has been on sSSI, but not needing much insulin. Given worsened kidney function and low BG, will change to very sensitive SSI. - vsSSI - If minimal insulin is needed, consider dcing SSI and making CBG checks less frequent.   FEN/GI: Regular PPx: Lovenox (was not ordered yesterday, plt and hgb stable today so will reorder) Dispo:Pending GOC discussion w/ family  Subjective:  Pt reports that back pain is still bothersome, although has improved with med regimen adjustments.   Objective: Temp:  [98.2 F (36.8 C)-98.4 F (36.9 C)] 98.4 F (36.9 C) (01/21 1201) Pulse Rate:  [59-62] 62 (01/21 1201) Resp:  [14-16] 16 (01/21 1201) BP: (114-117)/(44-48) 117/48 (01/21 1201) SpO2:  [94 %-100 %] 94 % (01/21 1201) Physical Exam: General: Alert, laying in bed. NAD. Neuro: A&Ox3 Cardiovascular: RRR Respiratory: CTAB. Normal WOB on RA Abdomen: Soft, nontender, nondistended. Normal BS.   Laboratory: Most recent CBC Lab Results  Component Value Date   WBC 6.1 01/22/2022   HGB 8.2 (L) 01/22/2022   HCT 24.8 (L) 01/22/2022   MCV 92.5 01/22/2022   PLT 155 01/22/2022   Most recent BMP    Latest Ref Rng & Units 01/22/2022    3:59 AM  BMP  Glucose 70 - 99 mg/dL 95  BUN 8 - 23 mg/dL 112   Creatinine 6.86 - 1.24 mg/dL 4.85   Sodium 984 - 885 mmol/L 138   Potassium 3.5 - 5.1 mmol/L 5.1   Chloride 98 - 111 mmol/L 106   CO2 22 - 32 mmol/L 21   Calcium 8.9 - 10.3 mg/dL 8.6    Lincoln Brigham, MD 01/22/2022, 2:02 PM  PGY-1, [facility] FPTS Intern pager: [phone], text pages welcome Secure chat group [facility] Teaching Service

## 2022-01-22 NOTE — Progress Notes (Signed)
Civil engineer, contracting [facility]) Hospital Liaison Note  Referral received for patient/family interest in [facility].   Hospice eligibility pending.   Bedside evaluation will take place tomorrow.   Please call with any questions or concerns. Thank you  Dionicio Stall, [facility] Liaison [phone]

## 2022-01-22 NOTE — Assessment & Plan Note (Addendum)
Palliative had family meeting yesterday, pt now DNR and desiring hospice. Focus on pain control. - Palliative consulted, appreciate recs

## 2022-01-22 NOTE — Assessment & Plan Note (Addendum)
Has been on sSSI, but not needing much insulin. Given worsened kidney function and low BG, will change to very sensitive SSI. - vsSSI - If minimal insulin is needed, consider dcing SSI and making CBG checks less frequent.

## 2022-01-22 NOTE — Progress Notes (Signed)
Patient ID: Jacob [name], male   DOB: [birth year], 89 y.o.   MRN: [id number] S: feels a little better this morning but still not eating or drinking. O:BP (!) 115/46 (BP Location: Right Arm)   Pulse 60   Temp 98.3 F (36.8 C) (Oral)   Resp 14   Ht 5\' 6"  (1.676 m)   Wt 68.9 kg   SpO2 98%   BMI 24.53 kg/m   Intake/Output Summary (Last 24 hours) at 01/22/2022 1019 Last data filed at 01/22/2022 0425 Gross per 24 hour  Intake 100 ml  Output 600 ml  Net -500 ml   Intake/Output: I/O last 3 completed shifts: In: 450 [P.O.:250; IV Piggyback:200] Out: 800 [Urine:800]  Intake/Output this shift:  No intake/output data recorded. Weight change:  Gen: mild distress CVS: RRR Resp: CTA Abd: +BS, soft, mildly tender to palpation Ext: no edema  Recent Labs  Lab 01/17/22 0708 01/18/22 0652 01/19/22 0349 01/20/22 0542 01/21/22 0648 01/22/22 0359  NA 134* 134* 133* 134* 135 138  K 4.4 4.4 4.0 4.5 4.7 5.1  CL 98 103 102 104 105 106  CO2 23 22 22  19* 20* 21*  GLUCOSE 176* 121* 141* 116* 127* 95  BUN 51* 56* 70* 82* 94* 100*  CREATININE 2.49* 2.65* 3.19* 4.02* 4.46* 4.74*  CALCIUM 9.0 8.4* 7.9* 7.9* 8.2* 8.6*   Liver Function Tests: No results for input(s): "AST", "ALT", "ALKPHOS", "BILITOT", "PROT", "ALBUMIN" in the last 168 hours. No results for input(s): "LIPASE", "AMYLASE" in the last 168 hours. No results for input(s): "AMMONIA" in the last 168 hours. CBC: Recent Labs  Lab 01/18/22 0652 01/19/22 0349 01/20/22 0542 01/20/22 1154 01/21/22 0648 01/22/22 0359  WBC 8.0 6.6 9.0  --  8.2 6.1  HGB 8.7* 8.0* 7.7*  --  8.1* 8.2*  HCT 26.0* 23.8* 23.0*  --  25.0* 24.8*  MCV 91.9 91.5 92.0  --  94.0 92.5  PLT 74* 59* 57* 71* 117* 155   Cardiac Enzymes: No results for input(s): "CKTOTAL", "CKMB", "CKMBINDEX", "TROPONINI" in the last 168 hours. CBG: Recent Labs  Lab 01/21/22 1655 01/21/22 1943 01/21/22 2348 01/22/22 0429 01/22/22 0738  GLUCAP 126* 90 127* 104* 92     Iron Studies: No results for input(s): "IRON", "TIBC", "TRANSFERRIN", "FERRITIN" in the last 72 hours. Studies/Results: DG CHEST PORT 1 VIEW  Result Date: 01/21/2022 CLINICAL DATA:  Aspiration. EXAM: PORTABLE CHEST 1 VIEW COMPARISON:  01/18/2022 FINDINGS: 1039 hours. The cardio pericardial silhouette is enlarged. Vascular congestion and diffuse interstitial opacity suggests edema. Interval progression of bibasilar collapse/consolidative disease with persistent small bilateral pleural effusions. Left permanent pacemaker again noted. Bones are diffusely demineralized. IMPRESSION: Cardiomegaly with interstitial pulmonary edema. Interval progression of bibasilar collapse/consolidative disease with persistent small bilateral pleural effusions. Electronically Signed   By: 01/23/2022 M.D.   On: 01/21/2022 11:51   [facility] RENAL  Result Date: 01/20/2022 CLINICAL DATA:  AKI. Status post left nephrectomy. Per technologist note, limited examination due to limitations in patient positioning due to thoracic spine brace. EXAM: RENAL / URINARY TRACT ULTRASOUND COMPLETE COMPARISON:  CT abdomen and pelvis without contrast October 19, 2021 FINDINGS: Right Kidney: Renal measurements: 8.3 x 5.0 x 4.1 cm = volume: 89.4 mL. Echogenicity within normal limits. No mass or hydronephrosis visualized. Left Kidney: The left kidney is surgically absent. Bladder: Appears normal for degree of bladder distention. Other: None. IMPRESSION: No right nephrolithiasis or hydronephrosis. Electronically Signed   By: 01/22/2022 M.D.   On: 01/20/2022 11:11  amiodarone  200 mg Oral BID   Followed by   Melene Muller ON 01/29/2022] amiodarone  200 mg Oral Daily   atorvastatin  40 mg Oral Daily   calcitonin (salmon)  1 spray Alternating Nares Daily   insulin aspart  0-9 Units Subcutaneous Q4H   lactose free nutrition  237 mL Oral TID WC   latanoprost  1 drop Both Eyes QHS   mometasone-formoterol  2 puff Inhalation BID   And   umeclidinium  bromide  1 puff Inhalation Daily   multivitamin with minerals  1 tablet Oral Daily   mouth rinse  15 mL Mouth Rinse 4 times per day   oxyCODONE-acetaminophen  1 tablet Oral Q4H   pantoprazole  40 mg Oral Daily    BMET    Component Value Date/Time   NA 138 01/22/2022 0359   NA 137 07/07/2011 1534   K 5.1 01/22/2022 0359   K 3.8 07/07/2011 1534   CL 106 01/22/2022 0359   CL 100 07/07/2011 1534   CO2 21 (L) 01/22/2022 0359   CO2 29 07/07/2011 1534   GLUCOSE 95 01/22/2022 0359   GLUCOSE 115 (H) 07/07/2011 1534   BUN 100 (H) 01/22/2022 0359   BUN 17 07/07/2011 1534   CREATININE 4.74 (H) 01/22/2022 0359   CREATININE 1.40 (H) 07/07/2011 1534   CALCIUM 8.6 (L) 01/22/2022 0359   CALCIUM 8.7 07/07/2011 1534   GFRNONAA 11 (L) 01/22/2022 0359   GFRNONAA 47 (L) 07/07/2011 1534   GFRAA 38 (L) 03/27/2015 1614   GFRAA 55 (L) 07/07/2011 1534   CBC    Component Value Date/Time   WBC 6.1 01/22/2022 0359   RBC 2.68 (L) 01/22/2022 0359   HGB 8.2 (L) 01/22/2022 0359   HGB 13.7 07/07/2011 1534   HCT 24.8 (L) 01/22/2022 0359   HCT 41.6 07/07/2011 1534   PLT 155 01/22/2022 0359   PLT 226 07/07/2011 1534   MCV 92.5 01/22/2022 0359   MCV 97 07/07/2011 1534   MCH 30.6 01/22/2022 0359   MCHC 33.1 01/22/2022 0359   RDW 17.1 (H) 01/22/2022 0359   RDW 13.6 07/07/2011 1534   LYMPHSABS 1.0 10/24/2021 0545   MONOABS 0.4 10/24/2021 0545   EOSABS 0.1 10/24/2021 0545   BASOSABS 0.0 10/24/2021 0545    Assessment/Plan:  AKI/CKD stage IIIb-IV, oliguric - likely ischemic ATN following hypotension with VT and sepsis in setting of solitary kidney.  Renal US without evidence of hydronephrosis of solitary right kidney and bladder scans negative for urinary retention.  I discussed with him the severity of his CKD at baseline and discussed RRT.  He is not a candidate for long-term HD given his advanced age, multiple co-morbidities and poor functional and nutritional status.  He is open to comfort care if his  renal function continues to decline.  Thankfully no urgent indication for dialysis and would likely require CRRT given his hypotensive episodes.  Would recommend palliative care consult to help set goals/limits of care which are to meet with his son today.  I will start gentle IVF's as he is not taking anything by mouth. Avoid nephrotoxic medications including NSAIDs and iodinated intravenous contrast exposure unless the latter is absolutely indicated.   Preferred narcotic agents for pain control are hydromorphone, fentanyl, and methadone. Morphine should not be used.  Avoid Baclofen and avoid oral sodium phosphate and magnesium citrate based laxatives / bowel preps.  Continue strict Input and Output monitoring.  Will monitor the patient closely with you and intervene or  adjust therapy as indicated by changes in clinical status/labs   Pseudomonas bacteremia - Urosepsis currently on IV cefepime.  TTE negative for vegetations.  ID following and awaiting TEE. Ventricular tachycardia, sustained - improved with amiodarone, EP following. 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  EP following. Anemia of CKD stage IV - transfuse prn Thrombocytopenia - DIC panel ordered, off of lovenox. 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 Lumbar compression fracture - TLSO brace in place. HTN - low Bp's and home meds held. Disposition - poor overall prognosis and pt open to comfort care.  Dr. Hyacinth Meeker has discussed with his family and plan for Palliative care meeting tomorrow to set goals/limits of care.   Jacob Cords, MD [facility]

## 2022-01-22 NOTE — Progress Notes (Signed)
Per Palliative Care, pt and pt's son agreeable to hospice home placement and requesting [employer], operational. Referral made to [facility] with [facility] who will eval pt and provide update. SW will follow.   Dellie Burns, MSW, LCSW [phone] (coverage)

## 2022-01-22 NOTE — Progress Notes (Signed)
Palliative:  HPI: 89 y.o. 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 PMT was consulted to discuss goals of care.  I met today with Mr. Hammitt and son, Jedadiah, at bedside after arranging meeting and review of poor prognosis with Marcy Salvo privately. Mr. Lofaro is lying in bed. He reports that he is comfortable initially but then expressed pain "in tail bone" towards the end of my visit with some relief with reposition. He endorses much pain with any movement. He also has mildly labored breathing at rest.   I explained overall condition and poor prognosis. I explained the importance of considering the risks vs benefits of what we chose to go through moving forward. Mr. Agresta is able to admit that he does feel that his time is limited and he is fearful. He is most fearful of worsening pain and suffering. I discussed with them resuscitation and my concern that this would only cause Mr. Littler more pain and suffering at the end of his life. They agree with DNR. I discussed with them focus on pain management and comfort vs labs and interventions trying to treat numbers that may only serve to prolong his suffering. He would also not desire dialysis or CRRT. After discussion they agree with comfort care. Mr. Cortright does not want to suffer and he is tired. They agree with transition to hospice facility (son requests [place] facility as all family live in that area). They have had other family members in this hospice and they have been well cared for. At this time we will  continue IVF and antibiotics and will reassess risks vs benefits again tomorrow morning.   Myrick expressed that some family have expressed concern with other family at hospice when they were not offered food/drink (Mykeal's mother had dementia and was there for end of life). I discussed with Torri comfort feeds but in specific situations when people are unable to swallow and food/drink goes into lungs down the wrong pipe that this can cause more discomfort and distress and may not be a good option for some people. I explained that I anticipate that his father will eventually be in a state of lethargy where he will not be eating or drinking and this is a normal part of end of life and how the body compensates during this process. I explained how when we have the flu or an acute illness we often loose our appetites and this is common with chronic illness and end of life and people do not feel discomfort from not eating or drinking in this scenario. We also discussed the harms of IVF at certain stages especially with renal failure because if the kidneys cannot expel the fluids they build in the lungs and cause trouble breathing and causing edema and swelling that can be uncomfortable. Hurley expresses understanding and agrees with the plan for comfort and hospice placement.   Ad WORKS NIGHT SHIFT AND WOULD LIKE TO BE NOTIFIED/CONTACTED PRIOR TO 11AM IF POSSIBLE.   Exam: Alert, oriented. Pain with movement and intermittently at rest. LUE edematous and elevated on pillows. Breathing mildly labored  at rest. Warm to touch.   Plan: - DNR - Comfort focused care. - Continue IVF and antibiotics for now - they understand that this will not be continued at hospice.  - Pain: OxyIR 10 mg every 4 hours scheduled. Fentanyl IV 12.5 mcg every 2 hours PRN. Will see how he does on this regimen but low threshold to change fentanyl to dilaudid given short half life. Will avoid morphine due to kidney failure.   60  min  Yong Channel, NP Palliative Medicine Team Pager [phone] (Please see [email or web address] for schedule) Team Phone [phone]    Greater than 50%  of this time was spent counseling and coordinating care related to the above assessment and plan

## 2022-01-23 DIAGNOSIS — R52 Pain, unspecified: Secondary | ICD-10-CM | POA: Diagnosis not present

## 2022-01-23 DIAGNOSIS — C661 Malignant neoplasm of right ureter: Secondary | ICD-10-CM

## 2022-01-23 DIAGNOSIS — R6520 Severe sepsis without septic shock: Secondary | ICD-10-CM

## 2022-01-23 DIAGNOSIS — E43 Unspecified severe protein-calorie malnutrition: Secondary | ICD-10-CM

## 2022-01-23 DIAGNOSIS — S0990XD Unspecified injury of head, subsequent encounter: Secondary | ICD-10-CM

## 2022-01-23 DIAGNOSIS — Z515 Encounter for palliative care: Secondary | ICD-10-CM | POA: Diagnosis not present

## 2022-01-23 DIAGNOSIS — A4152 Sepsis due to Pseudomonas: Secondary | ICD-10-CM

## 2022-01-23 MED ORDER — SENNOSIDES-DOCUSATE SODIUM 8.6-50 MG PO TABS: 1.0000 | ORAL_TABLET | Freq: Every evening | ORAL | Status: DC | PRN

## 2022-01-23 MED ORDER — HYDROMORPHONE HCL 1 MG/ML IJ SOLN
0.5000 mg | INTRAMUSCULAR | Status: DC
  Administered 2022-01-23 (×2): 0.5 mg via INTRAVENOUS
  Filled 2022-01-23 (×2): qty 1

## 2022-01-23 MED ORDER — HYDROMORPHONE HCL 1 MG/ML IJ SOLN: 0.5000 mg | INTRAMUSCULAR | 0 refills | Status: DC

## 2022-01-23 MED ORDER — HYDROMORPHONE HCL 1 MG/ML IJ SOLN
0.5000 mg | INTRAMUSCULAR | Status: AC
  Administered 2022-01-23: 0.5 mg via INTRAVENOUS
  Filled 2022-01-23: qty 0.5

## 2022-01-23 MED ORDER — GLYCOPYRROLATE 0.2 MG/ML IJ SOLN
0.4000 mg | INTRAMUSCULAR | Status: DC
  Administered 2022-01-23 (×2): 0.4 mg via INTRAVENOUS
  Filled 2022-01-23 (×2): qty 2

## 2022-01-23 MED ORDER — AMIODARONE HCL 200 MG PO TABS: 200.0000 mg | ORAL_TABLET | Freq: Two times a day (BID) | ORAL | Status: AC

## 2022-01-23 MED ORDER — ACETAMINOPHEN 325 MG PO TABS: 650.0000 mg | ORAL_TABLET | Freq: Four times a day (QID) | ORAL | Status: DC | PRN

## 2022-01-23 MED ORDER — ONDANSETRON 4 MG PO TBDP: 4.0000 mg | ORAL_TABLET | Freq: Four times a day (QID) | ORAL | 0 refills | Status: DC | PRN

## 2022-01-23 MED ORDER — HYDROMORPHONE HCL 1 MG/ML IJ SOLN: 0.5000 mg | INTRAMUSCULAR | 0 refills | Status: DC | PRN

## 2022-01-23 MED ORDER — GLYCOPYRROLATE 0.2 MG/ML IJ SOLN: 0.4000 mg | INTRAMUSCULAR | 0 refills | Status: DC

## 2022-01-23 MED ORDER — AMIODARONE HCL 200 MG PO TABS: 200.0000 mg | ORAL_TABLET | Freq: Every day | ORAL | Status: DC

## 2022-01-23 MED ORDER — HYDROMORPHONE HCL 1 MG/ML IJ SOLN: 0.5000 mg | INTRAMUSCULAR | Status: DC | PRN

## 2022-01-23 MED ORDER — CALCITONIN (SALMON) 200 UNIT/ACT NA SOLN: 1.0000 | Freq: Every day | NASAL | 12 refills | Status: DC

## 2022-01-23 MED ORDER — HALOPERIDOL 0.5 MG PO TABS: 0.5000 mg | ORAL_TABLET | ORAL | Status: DC | PRN

## 2022-01-23 NOTE — Progress Notes (Signed)
Daily Progress Note Intern Pager: [phone]  Patient name: Jacob [name] Medical record number: [id number] Date of birth: 1928/02/06 Age: 89 y.o. Gender: male  Primary Care Provider: Sherrie Mustache, MD Consultants: Palliative, nephrology Code Status: DNR, comfort  [name]: Admitted  1/18: Blood cx 24 hr result: pseudomonas 1/19: Worsening Cr, Nephrology Consulted  1/21: Transition to comfort care, DNR  Assessment and Plan: Jacob [name] is a 89 y.o. male with PMHx renal cell carcinoma with recent reoccurrence, COPD with no O2 at baseline, hyperlipidemia, advanced AV block s/p DDD PPM 2015, NSVT, PVCs, excised infiltrative basal cell carcinoma on the face presenting with fall, then found to have pseudomonas bacteremia and progressive kidney disease. Pt and family had GOC discussion yesterday and have now transitioned to comfort care. Pt is medically stable for discharge to hospice, pending placement.  * Bacteremia Remains afebrile. Will continue antibx while here (per family preference), but they are aware that antibx will be stopped at dialysis. - s/p cefepime (1/17 - 1/18) - Cont meropenem (1/19 - )  - ID consulted, appreciate recommendations   Acute kidney injury superimposed on chronic kidney disease (HCC) Poor candidate for dialysis, pending GOC discussion - consult nephrology, appreciate help with care - cont bladder scans Q6H  - Strict I/Os  - daily BMP   Lumbar compression fracture [facility]) Palliative consulted, appreciate recs. 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  - strict I&Os - Follow-up with urology outpatient - continue GOC discussions   V tach (HCC) Stable - Amiodarone 400 mg p.o. BID (1/17 - 1/20) -  Start Amiodarone 200 mg BID (1/21 - 1/27) - Start Amiodarone 200 mg daily (1/28)  Counseling regarding goals of care Palliative had family meeting yesterday, pt now DNR and desiring hospice. Focus on pain control. - Palliative consulted, appreciate recs  Type 2 diabetes mellitus without complication, without long-term current use of insulin (HCC) Has been on sSSI, but not needing much insulin. Given worsened kidney function and low BG, will change to very sensitive SSI. - vsSSI - If minimal insulin is needed, consider dcing SSI and making CBG checks less frequent.   FEN/GI: Regular PPx: SCD's Dispo:Hospice, pending placement.  Subjective:  NAEO  Objective: Temp:  [98.5 F (36.9 C)-98.7 F (37.1 C)] 98.6 F (37 C) (01/22 0814) Pulse Rate:  [59-78] 78 (01/22 1101) Resp:  [16-18] 18 (01/22 1101) BP: (108-122)/(40-45) 122/40 (01/22 0814) SpO2:  [94 %-100 %] 94 % (01/22 1101) FiO2 (%):  [28 %] 28 % (01/22 1101) Physical Exam: General: Comfortable appearing, sleeping on back, snoring. NAD. Respiratory: Normal WOB on 2L [place]  Laboratory: Most recent CBC Lab Results  Component Value Date   WBC 6.1 01/22/2022   HGB 8.2 (L) 01/22/2022   HCT 24.8 (L) 01/22/2022   MCV 92.5 01/22/2022   PLT 155 01/22/2022   Most recent BMP    Latest Ref Rng & Units 01/22/2022    3:59 AM  BMP  Glucose 70 - 99 mg/dL 95   BUN 8 - 23 mg/dL 672   Creatinine 5.50 - 1.24 mg/dL 0.16   Sodium 429 - 037 mmol/L 138   Potassium 3.5 - 5.1 mmol/L 5.1   Chloride 98 - 111  mmol/L 106   CO2 22 - 32 mmol/L 21   Calcium 8.9 - 10.3 mg/dL 8.6    Lincoln Brigham, MD 01/23/2022, 3:05 PM  PGY-1, [facility] Family Medicine FPTS Intern pager: [phone], text pages welcome Secure chat group [facility] Teaching Service

## 2022-01-23 NOTE — Discharge Summary (Cosign Needed)
Family Medicine Teaching [facility] Discharge Summary  Patient name: Jacob [name] Medical record number: [id number] Date of birth: [birth year] Age: 89 y.o. Gender: male Date of Admission: 01/17/2022  Date of Discharge: 01/23/2022 Admitting Physician: Carney Living, MD  Primary Care Provider: Sherrie Mustache, MD Consultants: Palliative, nephrology, ID, Cardiology  Indication for Hospitalization: Lumbar and sacral fracture Principal Diagnosis is Sepsis supported by the Acute renal failure with Pseudomonas aeruginosa bacteremia Brief Hospital Course:  Jacob [name] is a 89 y.o. male with PMHx renal cell carcinoma with recent reoccurrence, COPD with no O2 at baseline, hyperlipidemia, advanced AV block s/p DDD PPM 2015, NSVT, PVCs, excised infiltrative basal cell carcinoma on the face presenting with fall without loss of consciousness but did hit head during the process.  His hospital course is outlined below:   Goals of Care: [facility] course was complicated by acutely worsening renal function. Nephrology was consulted but determined he was poor dialysis candidate. Palliative was consulted, had family meeting to have GOC discussion. Pt was transitioned to comfort care and discharged to hospice.   Fall  Lumbar Fracture  Sacral Fracture  Head Laceration:  Presented to the ED s/p mechanical fall due to loss of balance and hitting his head. Patient denies LOC. In the ED, the patient had laceration repair to the head injury. CT head showed no fracture or acute intracranial abnormality.  DG lumbar spine showed L3 and L1 compression deformity s/p vertebroplasty. DG pelvis showed nondisplaced left sided sacral fracture in zone 2.  NSGY consulted who recommended TLSO brace and follow up outpatient. The patient's mental status remained stable. His pain was treated with tylenol, dilaudid, and intranasal calcitonin.   V Tach:  On admission, his pacemaker was interrogated and  showed sustained V tach outpatient. The timing did not coincide with the fall. Cardiology was consulted and started an amiodarone drip on 1/16. He was transitioned to PO amiodarone.   GU Cancer  Anemia: On admission Hgb found to be 7.7. Transfusion threshold >8 due to CAD. He was transfused 1 unit RBC on 1/16. Post H&H resulted to 8.1. Anemia was thought to be due to chronic hematuria outpatient secondary to bladder and ureter cancer and trauma from the fall. He did not have hematuria while hospitalized.   Bacteremia due to Pseudomonas:  Febrile to 100.4 on 1/17. Blood cultures drawn on 1/17 positive for pseudomonas. ID was consulted due to implanted pacemaker. Pt was treated w/ Cefepime, then transitioned to meropenem. Echocardiogram obtained and was within normal limits negative for vegetations. Antibiotics were discontinued when discharged to hospice.   CKD now progressed to ERSD:  Has hx of CKD. Cr on admission close ot baseline to 2.4. Patient was given x2 boluses in the ED then remained on maintenance IVF for several days. His Cr began to increase with max being 4.02. His urine output began slowing and bladder scans showed no retention. Nephrology was consulted, but pt is poor dialysis candidate.   Discharge Diagnoses/Problem List:  Lumbar fx, sacral fxm Vtach, GU cancer, anemia, bactreremia, ESRD  Disposition: Hospice  Discharge Condition: Comfort care, stable  Discharge Exam:  General: Comfortable appearing, sleeping on back, snoring. NAD. Respiratory: Normal WOB on 2L [place] Skin: warm, dry  Significant Labs and Imaging:  Recent Labs  Lab 01/22/22 0359  WBC 6.1  HGB 8.2*  HCT 24.8*  PLT 155   Recent Labs  Lab 01/22/22 0359  NA 138  K 5.1  CL 106  CO2 21*  GLUCOSE 95  BUN 100*  CREATININE 4.74*  CALCIUM 8.6*   Discharge Medications:  Allergies as of 01/23/2022       Reactions   Penicillins Hives   BLISTERS        Medication List     STOP taking these  medications    amoxicillin-clavulanate 875-125 MG tablet Commonly known as: AUGMENTIN   aspirin 81 MG tablet   atorvastatin 40 MG tablet Commonly known as: LIPITOR   cetirizine 10 MG tablet Commonly known as: ZYRTEC   empagliflozin 10 MG Tabs tablet Commonly known as: JARDIANCE   Fish Oil 1000 MG Caps   multivitamin tablet   nystatin-triamcinolone ointment Commonly known as: MYCOLOG       TAKE these medications    acetaminophen 325 MG tablet Commonly known as: TYLENOL Take 2 tablets (650 mg total) by mouth every 6 (six) hours as needed for mild pain (or Fever >/= 101). What changed:  when to take this reasons to take this   amiodarone 200 MG tablet Commonly known as: PACERONE Take 1 tablet (200 mg total) by mouth 2 (two) times daily for 6 days.   amiodarone 200 MG tablet Commonly known as: PACERONE Take 1 tablet (200 mg total) by mouth daily. Start taking on: January 29, 2022   budesonide-formoterol 160-4.5 MCG/ACT inhaler Commonly known as: SYMBICORT Inhale 2 puffs into the lungs 2 (two) times daily.   calcitonin (salmon) 200 UNIT/ACT nasal spray Commonly known as: MIACALCIN/FORTICAL Place 1 spray into alternate nostrils daily. Start taking on: 02-17-22   esomeprazole 20 MG capsule Commonly known as: NEXIUM Take 20 mg by mouth as needed (acid reflux).   Gemtesa 75 MG Tabs Generic drug: Vibegron Take 75 mg by mouth daily.   glycopyrrolate 0.2 MG/ML injection Commonly known as: ROBINUL Inject 2 mLs (0.4 mg total) into the vein every 4 (four) hours.   haloperidol 0.5 MG tablet Commonly known as: HALDOL Take 1 tablet (0.5 mg total) by mouth every 4 (four) hours as needed for agitation (or delirium).   HYDROmorphone 1 MG/ML injection Commonly known as: DILAUDID Inject 0.5-1 mLs (0.5-1 mg total) into the vein every 4 (four) hours.   HYDROmorphone 1 MG/ML injection Commonly known as: DILAUDID Inject 0.5-1 mLs (0.5-1 mg total) into the vein  every 2 (two) hours as needed for severe pain (sob).   isosorbide dinitrate 30 MG tablet Commonly known as: ISORDIL Take 30 mg by mouth daily.   latanoprost 0.005 % ophthalmic solution Commonly known as: XALATAN Place 1 drop into both eyes at bedtime.   ondansetron 4 MG disintegrating tablet Commonly known as: ZOFRAN-ODT Take 1 tablet (4 mg total) by mouth every 6 (six) hours as needed for nausea.   ProAir HFA 108 (90 Base) MCG/ACT inhaler Generic drug: albuterol Inhale 1 puff into the lungs every 4 (four) hours as needed for wheezing or shortness of breath.   senna-docusate 8.6-50 MG tablet Commonly known as: Senokot-S Take 1 tablet by mouth at bedtime as needed for moderate constipation. What changed:  when to take this reasons to take this additional instructions        Discharge Instructions: Please refer to Patient Instructions section of EMR for full details.  Patient was counseled important signs and symptoms that should prompt return to medical care, changes in medications, dietary instructions, activity restrictions, and follow up appointments.   Follow-Up Appointments:   Erick Alley, DO 01/23/2022, 4:36 PM PGY-1, [facility] Family Medicine   I have interviewed  and examined the patient.  I have discussed the case with Dr. Yetta Barre.   I agree with their documentation and management in her discharge note.    LOS: 5 days                                        For questions or updates, please contact Family Medicine Teaching Service Resident On-call at pager [phone] or  [email or web address] - see pager "CALL FOR HOSPITALIZED PATIENTS"

## 2022-01-23 NOTE — Plan of Care (Signed)
  Problem: Education: Goal: Knowledge of General Education information will improve Description: Including pain rating scale, medication(s)/side effects and non-pharmacologic comfort measures Outcome: Progressing   Problem: Health Behavior/Discharge Planning: Goal: Ability to manage health-related needs will improve Outcome: Progressing   Problem: Clinical Measurements: Goal: Ability to maintain clinical measurements within normal limits will improve Outcome: Progressing Goal: Will remain free from infection Outcome: Progressing Goal: Diagnostic test results will improve Outcome: Progressing Goal: Respiratory complications will improve Outcome: Progressing Goal: Cardiovascular complication will be avoided Outcome: Progressing   Problem: Activity: Goal: Risk for activity intolerance will decrease Outcome: Progressing   Problem: Nutrition: Goal: Adequate nutrition will be maintained Outcome: Progressing   Problem: Coping: Goal: Level of anxiety will decrease Outcome: Progressing   Problem: Elimination: Goal: Will not experience complications related to bowel motility Outcome: Progressing Goal: Will not experience complications related to urinary retention Outcome: Progressing   Problem: Pain Managment: Goal: General experience of comfort will improve Outcome: Progressing   Problem: Safety: Goal: Ability to remain free from injury will improve Outcome: Progressing   Problem: Skin Integrity: Goal: Risk for impaired skin integrity will decrease Outcome: Progressing   Problem: Education: Goal: Ability to describe self-care measures that may prevent or decrease complications (Diabetes Survival Skills Education) will improve Outcome: Progressing Goal: Individualized Educational Video(s) Outcome: Progressing   Problem: Coping: Goal: Ability to adjust to condition or change in health will improve Outcome: Progressing   Problem: Fluid Volume: Goal: Ability to  maintain a balanced intake and output will improve Outcome: Progressing   Problem: Health Behavior/Discharge Planning: Goal: Ability to identify and utilize available resources and services will improve Outcome: Progressing Goal: Ability to manage health-related needs will improve Outcome: Progressing   Problem: Metabolic: Goal: Ability to maintain appropriate glucose levels will improve Outcome: Progressing   Problem: Nutritional: Goal: Maintenance of adequate nutrition will improve Outcome: Progressing Goal: Progress toward achieving an optimal weight will improve Outcome: Progressing   Problem: Skin Integrity: Goal: Risk for impaired skin integrity will decrease Outcome: Progressing   Problem: Tissue Perfusion: Goal: Adequacy of tissue perfusion will improve Outcome: Progressing   Problem: Education: Goal: Knowledge of the prescribed therapeutic regimen will improve Outcome: Progressing   Problem: Coping: Goal: Ability to identify and develop effective coping behavior will improve Outcome: Progressing   Problem: Clinical Measurements: Goal: Quality of life will improve Outcome: Progressing   Problem: Respiratory: Goal: Verbalizations of increased ease of respirations will increase Outcome: Progressing   Problem: Role Relationship: Goal: Family's ability to cope with current situation will improve Outcome: Progressing Goal: Ability to verbalize concerns, feelings, and thoughts to partner or family member will improve Outcome: Progressing   Problem: Pain Management: Goal: Satisfaction with pain management regimen will improve Outcome: Progressing

## 2022-01-23 NOTE — Progress Notes (Signed)
[facility] called and transport arranged.

## 2022-01-23 NOTE — Progress Notes (Signed)
Discussed with primary service. Patient is now on comfort care. Will sign off from a nephrology perspective. Please call with any questions/concerns.  Anthony Sar, MD [facility]

## 2022-01-23 NOTE — Progress Notes (Signed)
Id brief note   Patient transitioned to comfort care, pending final disposition   A/p Pseudomonas bacteremia, presence of pacer -- urinary source L2 compression fracture   -cancel tee -will planned 10 days of meropenem for pseudomonal bsi and urinary treatment. But if he goes before that can stop abx -longer abx than 10 days even if patient remains here has not beneficial effect for him -will place stop date 01/30/22 -discussed with primary team

## 2022-01-23 NOTE — Progress Notes (Signed)
Report called to [facility] to [place]. All questions answered.

## 2022-01-23 NOTE — Progress Notes (Signed)
Palliative:  HPI: 89 y.o. 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 PMT was consulted to discuss goals of care.    I met today with Jacob [name] and no family or visitors to bedside. Jacob [name] is awake and tells me that his pain is "horrible." He reports that he is having pain all throughout his back and worse than yesterday. He agrees to completely shifting pain regimen to have better relief. RN is at the bedside and discussed plan with IV dilaudid scheduled and PRN to better control pain. He did have a few sips of feeding supplement but otherwise not eating.   I discussed with TOC and hospice liaison. Prognosis is poor < 2 weeks and still with uncontrolled pain. Still agree with hospice facility placement.   I called son, Jacob [name], prior to 11am as requested. I was unable to reach him but did leave HIPAA compliant voicemail indicating that I am continuing to adjust pain regimen and still awaiting plans/timing for transfer. Encouraged him to call me back with any further questions or concerns.   All questions/concerns addressed. Emotional support provided.   Exam: Alert, mostly oriented. He does not consistently answer questions and seems to nod off during conversation. Pain with movement and intermittently at rest. LUE edematous and elevated on pillows. Breathing mildly labored at rest with increased secretions. Warm to touch.    Plan: - DNR - Comfort focused care. - Continue IVF and antibiotics for now - they understand that this will not be continued at  hospice.  - Pain not controlled: Dilaudid 0.5-1 mg IV every 4 hours scheduled and every 2 hours PRN.  - Secretions: Robinul 0.4 mg IV every 4 hours.   35 min  Yong Channel, NP Palliative Medicine Team Pager [phone] (Please see [email or web address] for schedule) Team Phone [phone]    Greater than 50%  of this time was spent counseling and coordinating care related to the above assessment and plan

## 2022-01-23 NOTE — Progress Notes (Signed)
[facility] [id number]) Hospital Liaison Note  Received request from [facility], [facility] for family interest in the Hospice Home. Spoke with patient's son, Zelig, to confirm interest and explain services. Eligibility confirmed per [facility] MD. Captain is agreeable to transfer today.   Hospital staff and [facility] aware.  RN, please call report to [phone] prior to patient leaving the unit. Please send signed and completed DNR with patient at discharge.   Thank you,  Lamount Cranker RN  [facility] Liaison [phone]

## 2022-01-24 LAB — CULTURE, BLOOD (ROUTINE X 2)
Culture: NO GROWTH
Culture: NO GROWTH
Special Requests: ADEQUATE
Special Requests: ADEQUATE
Special Requests: ADEQUATE

## 2022-01-25 DIAGNOSIS — R6520 Severe sepsis without septic shock: Secondary | ICD-10-CM

## 2022-02-01 ENCOUNTER — Ambulatory Visit: Payer: Medicare Other | Admitting: Internal Medicine

## 2022-02-02 DEATH — deceased

## 2022-04-06 ENCOUNTER — Other Ambulatory Visit: Payer: Medicare Other

## 2022-10-04 ENCOUNTER — Ambulatory Visit: Payer: Medicare Other | Admitting: Urology
